# Patient Record
Sex: Male | Born: 1935
Health system: Southern US, Community
[De-identification: ages and names within clinical notes are randomized; demographics above are authoritative.]

## PROBLEM LIST (undated history)

## (undated) DIAGNOSIS — R0600 Dyspnea, unspecified: Secondary | ICD-10-CM

## (undated) DIAGNOSIS — E039 Hypothyroidism, unspecified: Secondary | ICD-10-CM

## (undated) DIAGNOSIS — I340 Nonrheumatic mitral (valve) insufficiency: Secondary | ICD-10-CM

## (undated) DIAGNOSIS — I4891 Unspecified atrial fibrillation: Secondary | ICD-10-CM

## (undated) DIAGNOSIS — R011 Cardiac murmur, unspecified: Secondary | ICD-10-CM

## (undated) DIAGNOSIS — R7303 Prediabetes: Secondary | ICD-10-CM

## (undated) DIAGNOSIS — N183 Chronic kidney disease, stage 3 unspecified: Secondary | ICD-10-CM

## (undated) DIAGNOSIS — I5032 Chronic diastolic (congestive) heart failure: Secondary | ICD-10-CM

## (undated) DIAGNOSIS — C4490 Unspecified malignant neoplasm of skin, unspecified: Secondary | ICD-10-CM

## (undated) DIAGNOSIS — K219 Gastro-esophageal reflux disease without esophagitis: Secondary | ICD-10-CM

## (undated) DIAGNOSIS — I219 Acute myocardial infarction, unspecified: Secondary | ICD-10-CM

## (undated) DIAGNOSIS — I2510 Atherosclerotic heart disease of native coronary artery without angina pectoris: Secondary | ICD-10-CM

## (undated) DIAGNOSIS — Z87442 Personal history of urinary calculi: Secondary | ICD-10-CM

## (undated) DIAGNOSIS — N201 Calculus of ureter: Secondary | ICD-10-CM

## (undated) DIAGNOSIS — M1990 Unspecified osteoarthritis, unspecified site: Secondary | ICD-10-CM

## (undated) DIAGNOSIS — I10 Essential (primary) hypertension: Secondary | ICD-10-CM

## (undated) HISTORY — PX: CARDIAC CATHETERIZATION: SHX172

## (undated) HISTORY — PX: CATARACT EXTRACTION W/ INTRAOCULAR LENS  IMPLANT, BILATERAL: SHX1307

## (undated) HISTORY — PX: KNEE ARTHROSCOPY: SUR90

## (undated) HISTORY — PX: ELECTROPHYSIOLOGIC STUDY: SHX172A

## (undated) HISTORY — PX: APPENDECTOMY: SHX54

## (undated) HISTORY — PX: CORONARY ANGIOPLASTY WITH STENT PLACEMENT: SHX49

## (undated) HISTORY — DX: Atherosclerotic heart disease of native coronary artery without angina pectoris: I25.10

## (undated) HISTORY — PX: TONSILLECTOMY: SUR1361

## (undated) HISTORY — DX: Dyspnea, unspecified: R06.00

## (undated) HISTORY — PX: CARDIOVASCULAR STRESS TEST: SHX262

---

## 2000-01-21 ENCOUNTER — Inpatient Hospital Stay (HOSPITAL_COMMUNITY): Admission: EM | Admit: 2000-01-21 | Discharge: 2000-01-24 | Payer: Self-pay | Admitting: Cardiology

## 2000-03-25 ENCOUNTER — Inpatient Hospital Stay (HOSPITAL_COMMUNITY): Admission: AD | Admit: 2000-03-25 | Discharge: 2000-03-31 | Payer: Self-pay | Admitting: Cardiology

## 2000-05-04 ENCOUNTER — Encounter: Payer: Self-pay | Admitting: Internal Medicine

## 2000-05-04 ENCOUNTER — Ambulatory Visit (HOSPITAL_COMMUNITY): Admission: RE | Admit: 2000-05-04 | Discharge: 2000-05-04 | Payer: Self-pay | Admitting: Internal Medicine

## 2001-05-17 ENCOUNTER — Encounter (INDEPENDENT_AMBULATORY_CARE_PROVIDER_SITE_OTHER): Payer: Self-pay | Admitting: Specialist

## 2001-05-17 ENCOUNTER — Other Ambulatory Visit: Admission: RE | Admit: 2001-05-17 | Discharge: 2001-05-17 | Payer: Self-pay | Admitting: Urology

## 2002-01-10 ENCOUNTER — Ambulatory Visit (HOSPITAL_COMMUNITY): Admission: RE | Admit: 2002-01-10 | Discharge: 2002-01-11 | Payer: Self-pay | Admitting: *Deleted

## 2002-01-10 ENCOUNTER — Encounter: Payer: Self-pay | Admitting: *Deleted

## 2002-10-23 ENCOUNTER — Ambulatory Visit (HOSPITAL_COMMUNITY): Admission: RE | Admit: 2002-10-23 | Discharge: 2002-10-24 | Payer: Self-pay | Admitting: Internal Medicine

## 2003-06-30 ENCOUNTER — Emergency Department (HOSPITAL_COMMUNITY): Admission: EM | Admit: 2003-06-30 | Discharge: 2003-06-30 | Payer: Self-pay | Admitting: Emergency Medicine

## 2004-07-03 ENCOUNTER — Ambulatory Visit: Payer: Self-pay | Admitting: Cardiology

## 2004-10-01 ENCOUNTER — Ambulatory Visit: Payer: Self-pay | Admitting: Internal Medicine

## 2004-12-23 ENCOUNTER — Ambulatory Visit: Payer: Self-pay | Admitting: Cardiology

## 2005-01-01 ENCOUNTER — Ambulatory Visit: Payer: Self-pay | Admitting: *Deleted

## 2005-03-29 ENCOUNTER — Ambulatory Visit: Payer: Self-pay | Admitting: Cardiology

## 2005-04-02 ENCOUNTER — Ambulatory Visit: Payer: Self-pay | Admitting: Cardiology

## 2005-06-07 ENCOUNTER — Ambulatory Visit: Payer: Self-pay | Admitting: Cardiology

## 2005-06-28 ENCOUNTER — Ambulatory Visit: Payer: Self-pay

## 2005-06-28 ENCOUNTER — Ambulatory Visit: Payer: Self-pay | Admitting: Cardiology

## 2005-07-08 ENCOUNTER — Ambulatory Visit: Payer: Self-pay | Admitting: Cardiology

## 2005-12-30 ENCOUNTER — Ambulatory Visit: Payer: Self-pay

## 2006-01-06 ENCOUNTER — Ambulatory Visit: Payer: Self-pay | Admitting: Internal Medicine

## 2006-02-14 ENCOUNTER — Ambulatory Visit: Payer: Self-pay | Admitting: Cardiology

## 2006-02-17 ENCOUNTER — Ambulatory Visit: Payer: Self-pay | Admitting: Internal Medicine

## 2006-05-09 ENCOUNTER — Ambulatory Visit: Payer: Self-pay | Admitting: Cardiology

## 2006-05-12 ENCOUNTER — Ambulatory Visit: Payer: Self-pay | Admitting: Cardiology

## 2006-09-05 ENCOUNTER — Ambulatory Visit: Payer: Self-pay | Admitting: Cardiology

## 2006-09-05 LAB — CONVERTED CEMR LAB
ALT: 23 units/L (ref 0–40)
AST: 25 units/L (ref 0–37)
Albumin: 3.4 g/dL — ABNORMAL LOW (ref 3.5–5.2)
Alkaline Phosphatase: 54 units/L (ref 39–117)
Total Bilirubin: 0.6 mg/dL (ref 0.3–1.2)

## 2006-09-08 ENCOUNTER — Ambulatory Visit: Payer: Self-pay | Admitting: Cardiology

## 2006-12-09 ENCOUNTER — Ambulatory Visit: Payer: Self-pay | Admitting: Cardiology

## 2006-12-18 ENCOUNTER — Observation Stay (HOSPITAL_COMMUNITY): Admission: EM | Admit: 2006-12-18 | Discharge: 2006-12-19 | Payer: Self-pay | Admitting: Emergency Medicine

## 2006-12-18 ENCOUNTER — Ambulatory Visit: Payer: Self-pay | Admitting: Cardiology

## 2007-03-06 ENCOUNTER — Ambulatory Visit: Payer: Self-pay | Admitting: Cardiology

## 2007-03-06 LAB — CONVERTED CEMR LAB
ALT: 26 units/L (ref 0–53)
Alkaline Phosphatase: 52 units/L (ref 39–117)
LDL Cholesterol: 59 mg/dL (ref 0–99)
Total CHOL/HDL Ratio: 3.1
VLDL: 16 mg/dL (ref 0–40)

## 2007-03-09 ENCOUNTER — Ambulatory Visit: Payer: Self-pay | Admitting: Internal Medicine

## 2007-09-04 ENCOUNTER — Ambulatory Visit: Payer: Self-pay

## 2007-09-04 LAB — CONVERTED CEMR LAB
Albumin: 3.4 g/dL — ABNORMAL LOW (ref 3.5–5.2)
Bilirubin, Direct: 0.2 mg/dL (ref 0.0–0.3)
HDL: 44.4 mg/dL (ref >39.0)
Total Bilirubin: 0.8 mg/dL (ref 0.3–1.2)
Total CHOL/HDL Ratio: 2.7
Triglycerides: 76 mg/dL (ref 0–149)
VLDL: 15 mg/dL (ref 0–40)

## 2007-09-11 ENCOUNTER — Ambulatory Visit: Payer: Self-pay | Admitting: Internal Medicine

## 2008-02-26 ENCOUNTER — Ambulatory Visit: Payer: Self-pay | Admitting: Cardiovascular Disease

## 2008-02-26 LAB — CONVERTED CEMR LAB
ALT: 24 units/L (ref 0–53)
Albumin: 3.8 g/dL (ref 3.5–5.2)
Alkaline Phosphatase: 54 units/L (ref 39–117)
Bilirubin, Direct: 0.2 mg/dL (ref 0.0–0.3)
Cholesterol: 117 mg/dL (ref 0–200)
LDL Cholesterol: 65 mg/dL (ref 0–99)
Total Protein: 6.6 g/dL (ref 6.0–8.3)
Triglycerides: 63 mg/dL (ref 0–149)
VLDL: 13 mg/dL (ref 0–40)

## 2008-03-07 ENCOUNTER — Ambulatory Visit: Payer: Self-pay | Admitting: Cardiology

## 2008-07-05 ENCOUNTER — Ambulatory Visit: Payer: Self-pay | Admitting: Cardiology

## 2008-09-02 ENCOUNTER — Ambulatory Visit: Payer: Self-pay | Admitting: Cardiology

## 2008-09-02 LAB — CONVERTED CEMR LAB
ALT: 23 units/L (ref 0–53)
AST: 25 units/L (ref 0–37)
Alkaline Phosphatase: 48 units/L (ref 39–117)
HDL: 41.5 mg/dL (ref >39.0)
Total Bilirubin: 0.8 mg/dL (ref 0.3–1.2)
Total CHOL/HDL Ratio: 2.8

## 2008-09-05 ENCOUNTER — Ambulatory Visit: Payer: Self-pay | Admitting: Internal Medicine

## 2009-02-17 ENCOUNTER — Ambulatory Visit: Payer: Self-pay | Admitting: Cardiovascular Disease

## 2009-02-17 LAB — CONVERTED CEMR LAB
Alkaline Phosphatase: 56 units/L (ref 39–117)
Bilirubin, Direct: 0.1 mg/dL (ref 0.0–0.3)
Cholesterol: 125 mg/dL (ref 0–200)
LDL Cholesterol: 67 mg/dL (ref 0–99)
Total Bilirubin: 0.8 mg/dL (ref 0.3–1.2)
Total CHOL/HDL Ratio: 3
Total Protein: 6 g/dL (ref 6.0–8.3)
VLDL: 17 mg/dL (ref 0.0–40.0)

## 2009-03-06 ENCOUNTER — Ambulatory Visit: Payer: Self-pay | Admitting: Cardiology

## 2009-03-06 DIAGNOSIS — E7800 Pure hypercholesterolemia, unspecified: Secondary | ICD-10-CM | POA: Insufficient documentation

## 2009-08-21 ENCOUNTER — Ambulatory Visit: Payer: Self-pay | Admitting: Cardiovascular Disease

## 2009-08-25 ENCOUNTER — Encounter (INDEPENDENT_AMBULATORY_CARE_PROVIDER_SITE_OTHER): Payer: Self-pay | Admitting: *Deleted

## 2009-08-26 LAB — CONVERTED CEMR LAB
ALT: 21 units/L (ref 0–53)
AST: 25 units/L (ref 0–37)
Albumin: 3.6 g/dL (ref 3.5–5.2)
Alkaline Phosphatase: 52 units/L (ref 39–117)
Bilirubin, Direct: 0 mg/dL (ref 0.0–0.3)
HDL: 38.4 mg/dL — ABNORMAL LOW (ref >39.00)
Total CHOL/HDL Ratio: 4
Total Protein: 6 g/dL (ref 6.0–8.3)

## 2010-01-05 DIAGNOSIS — K219 Gastro-esophageal reflux disease without esophagitis: Secondary | ICD-10-CM | POA: Insufficient documentation

## 2010-01-05 DIAGNOSIS — M129 Arthropathy, unspecified: Secondary | ICD-10-CM | POA: Insufficient documentation

## 2010-01-05 DIAGNOSIS — I2510 Atherosclerotic heart disease of native coronary artery without angina pectoris: Secondary | ICD-10-CM | POA: Insufficient documentation

## 2010-01-06 ENCOUNTER — Ambulatory Visit: Payer: Self-pay | Admitting: Cardiology

## 2010-02-16 ENCOUNTER — Ambulatory Visit: Payer: Self-pay | Admitting: Cardiology

## 2010-02-19 ENCOUNTER — Ambulatory Visit: Payer: Self-pay | Admitting: Cardiology

## 2010-06-02 LAB — CONVERTED CEMR LAB
ALT: 19 units/L (ref 0–53)
Albumin: 3.5 g/dL (ref 3.5–5.2)
Bilirubin, Direct: 0.2 mg/dL (ref 0.0–0.3)
Cholesterol: 138 mg/dL (ref 0–200)
LDL Cholesterol: 77 mg/dL (ref 0–99)
Total Protein: 5.9 g/dL — ABNORMAL LOW (ref 6.0–8.3)
Triglycerides: 83 mg/dL (ref 0.0–149.0)
VLDL: 16.6 mg/dL (ref 0.0–40.0)

## 2010-08-20 ENCOUNTER — Ambulatory Visit: Admission: RE | Admit: 2010-08-20 | Discharge: 2010-08-20 | Payer: Self-pay | Source: Home / Self Care

## 2010-08-20 ENCOUNTER — Ambulatory Visit: Admit: 2010-08-20 | Payer: Self-pay

## 2010-08-20 ENCOUNTER — Other Ambulatory Visit: Payer: Self-pay | Admitting: Internal Medicine

## 2010-08-20 LAB — LIPID PANEL
Cholesterol: 137 mg/dL (ref 0–200)
HDL: 43.4 mg/dL (ref >39.00)
LDL Cholesterol: 73 mg/dL (ref 0–99)
Total CHOL/HDL Ratio: 3
Triglycerides: 105 mg/dL (ref 0.0–149.0)
VLDL: 21 mg/dL (ref 0.0–40.0)

## 2010-08-20 LAB — HEPATIC FUNCTION PANEL
ALT: 25 U/L (ref 0–53)
AST: 26 U/L (ref 0–37)
Albumin: 3.8 g/dL (ref 3.5–5.2)
Alkaline Phosphatase: 52 U/L (ref 39–117)
Bilirubin, Direct: 0.2 mg/dL (ref 0.0–0.3)
Total Bilirubin: 1 mg/dL (ref 0.3–1.2)
Total Protein: 6.4 g/dL (ref 6.0–8.3)

## 2010-08-24 ENCOUNTER — Ambulatory Visit: Admission: RE | Admit: 2010-08-24 | Discharge: 2010-08-24 | Payer: Self-pay | Source: Home / Self Care

## 2010-09-01 NOTE — Assessment & Plan Note (Signed)
Summary: rov lipid - lmc   CC:  dyslipidemia follow-up.  History of Present Illness:  Lipid Clinic Visit      The patient comes in today for dyslipidemia follow-up.  The patient has no history of medication problems, chest pain, palpitations, shortness of breath, muscle aches, and muscle cramps.  Dietary compliance review reveals an overall grade of not eating 5 or more fruits and vegetables, counting carbohydrates, and limiting fats and TFA's.  Review of exercise habits reveals that the patient is biking and 3-4 times a week.  Compliance with medication is good.    Med and compliant and somewhat heart healthy diet.  Daily work on farm.  Rides exercise bike 30-4min 3-4 days a week  Daughter is a Teacher, music from [place]   Current Medications (verified): 1)  Lipitor 80 Mg Tabs (Atorvastatin Calcium) .... Take One Tablet By Mouth Daily. 2)  Metoprolol Tartrate 25 Mg Tabs (Metoprolol Tartrate) .... Take One Tablet By Mouth Twice A Day 3)  Niaspan 1000 Mg Cr-Tabs (Niacin (Antihyperlipidemic)) 4)  Aspirin 81 Mg Tbec (Aspirin) .... Take One Tablet By Mouth Daily 5)  Nexium 40 Mg Cpdr (Esomeprazole Magnesium) 6)  Folic Acid 400 Mcg Tabs (Folic Acid) 7)  Meloxicam 15 Mg Tabs (Meloxicam) 8)  Centrum Silver  Tabs (Multiple Vitamins-Minerals) 9)  Tramadol Hcl 50 Mg Tabs (Tramadol Hcl)   Vital Signs:  Patient profile:   75 year old male Height:      67 inches Weight:      188.8 pounds Pulse rate:   70 / minute Pulse rhythm:   regular BP sitting:   160 / 88  (left arm) Cuff size:   regular  Impression & Recommendations:  Problem # 1:  HYPERCHOLESTEROLEMIA-PURE (ICD-272.0)  His updated medication list for this problem includes:    Lipitor 80 Mg Tabs (Atorvastatin calcium) .Marland Kitchen... Take one tablet by mouth daily.    Niaspan 1000 Mg Cr-tabs (Niacin (antihyperlipidemic))  Orders: TLB-Hepatic/Liver Function Pnl (80076-HEPATIC) TLB-Lipid Panel (80061-LIPID)  [name] is a pleasant man  who returns to lipid clinic today with no complaints.  He is compliant with meds and moslty a heart helathy diet.   He does eat ice cream or popcorn  as an evening snack a few days a week.  We have talked about portion sizes, decreasing CHO and increasing fruit and vegie and fiber in diet.  He is doing a great job with his exercise.  Last lipid panel was at goal  - repeats drawn this AM and I will call him with reults - done 08/22/09  TC 138 at goal < 20   TG 164 > goal < 150 (85 at last visit)  LDL 67 at goal < 70   HDL 38 < goal > 40 - (41 at last visit) F/U in 6 months Prescriptions: NIASPAN 1000 MG CR-TABS (NIACIN (ANTIHYPERLIPIDEMIC))   #90 x 3   Entered by:   Leota Sauers, PharmD, BCPS, CPP   Authorized by:   Gaylord Shih, MD, [facility]   Signed by:   Leota Sauers, PharmD, BCPS, CPP on 08/21/2009   Method used:   Faxed to ...       [facility] ([facility]-order)       [address] [place], [place]  [address]       Ph: [phone]       Fax: [phone]   RxID:   [id number]

## 2010-09-01 NOTE — Letter (Signed)
Summary: Custom - Lipid  [facility], Main Office  [address]   [place], [place] [address]   Phone: [phone]  Fax: [phone]     August 25, 2009 MRN: [id number]   [name] [address] [place], [place]  [address]   Dear Mr. [name],  We have reviewed your cholesterol results.  They are as follows:     Total Cholesterol:    138 (Desirable: less than 200)       HDL  Cholesterol:     38.40  (Desirable: greater than 40 for men and 50 for women)       LDL Cholesterol:       67  (Desirable: less than 100 for low risk and less than 70 for moderate to high risk)       Triglycerides:       164.0  (Desirable: less than 150)  Our recommendations include:These numbers look good. Continue on the same medicine. Liver function is normal. Take care, Dr. Leanora Cover.    Call our office at the number listed above if you have any questions.  Lowering your LDL cholesterol is important, but it is only one of a large number of "risk factors" that may indicate that you are at risk for heart disease, stroke or other complications of hardening of the arteries.  Other risk factors include:   A.  Cigarette Smoking* B.  High Blood Pressure* C.  Obesity* D.   Low HDL Cholesterol (see yours above)* E.   Diabetes Mellitus (higher risk if your is uncontrolled) F.  Family history of premature heart disease G.  Previous history of stroke or cardiovascular disease    *These are risk factors YOU HAVE CONTROL OVER.  For more information, visit .  There is now evidence that lowering the TOTAL CHOLESTEROL AND LDL CHOLESTEROL can reduce the risk of heart disease.  The American Heart Association recommends the following guidelines for the treatment of elevated cholesterol:  1.  If there is now current heart disease and less than two risk factors, TOTAL CHOLESTEROL should be less than 200 and LDL CHOLESTEROL should be less than 100. 2.  If there is current heart disease or two or  more risk factors, TOTAL CHOLESTEROL should be less than 200 and LDL CHOLESTEROL should be less than 70.  A diet low in cholesterol, saturated fat, and calories is the cornerstone of treatment for elevated cholesterol.  Cessation of smoking and exercise are also important in the management of elevated cholesterol and preventing vascular disease.  Studies have shown that 30 to 60 minutes of physical activity most days can help lower blood pressure, lower cholesterol, and keep your weight at a healthy level.  Drug therapy is used when cholesterol levels do not respond to therapeutic lifestyle changes (smoking cessation, diet, and exercise) and remains unacceptably high.  If medication is started, it is important to have you levels checked periodically to evaluate the need for further treatment options.  Thank you,    [facility] [employer] Team

## 2010-09-01 NOTE — Assessment & Plan Note (Signed)
Summary: 18 month rov/sl      Allergies Added: NKDA]  Visit Type:  Follow-up Primary Provider:  Dr. Azucena Cecil  CC:  CAD.  History of Present Illness: The patient presents for followup of his known coronary disease.since I last saw him he has done well from a cardiovascular standpoint. He has had no chest pressure, neck or arm discomfort. He has had no palpitations, presyncope or syncope. His breathing is normal without any PND or orthopnea. 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 With this he has none of the above symptoms or previous symptoms.  Current Medications (verified): 1)  Lipitor 80 Mg Tabs (Atorvastatin Calcium) .... Take One Tablet By Mouth Daily. 2)  Metoprolol Tartrate 25 Mg Tabs (Metoprolol Tartrate) .... Take One Tablet By Mouth Twice A Day 3)  Niaspan 1000 Mg Cr-Tabs (Niacin (Antihyperlipidemic)) 4)  Aspirin 81 Mg Tbec (Aspirin) .... Take One Tablet By Mouth Daily 5)  Nexium 40 Mg Cpdr (Esomeprazole Magnesium) 6)  Folic Acid 400 Mcg Tabs (Folic Acid) 7)  Meloxicam 15 Mg Tabs (Meloxicam) 8)  Centrum Silver  Tabs (Multiple Vitamins-Minerals) 9)  Tramadol Hcl 50 Mg Tabs (Tramadol Hcl)  Allergies (verified): No Known Drug Allergies  Past History:  Past Medical History: Reviewed history from 01/05/2010 and no changes required.  Coronary artery disease (previous myocardial   infarction in 2001 with 90% circumflex lesion treated with PTCA.  The   LAD had 70-80% stenosis which was treated with angioplasty.  Most recent   catheterization in 2003 demonstrated the LAD at 30% mid stenosis,   circumflex 60-70% stenosis in the mid AV grove, and 40% stenosis in the   previously stented area.  The right coronary artery had 30-40% stenosis   in the mid portion.  The EF was 55%.  He had PTCA and stenting in the AV   circumflex.  His last stress perfusion study was in 2006), previous   syncope with a negative electrophysiology workup, gastroesophageal   reflux  disease, dyslipidemia (followed in the Lipid Clinic), arthritis.      Past Surgical History: Reviewed history from 01/05/2010 and no changes required.  1. Appendectomy.   2. Arthroscopic knee surgery.   Review of Systems       As stated in the HPI and negative for all other systems.   Vital Signs:  Patient profile:   75 year old male Height:      67 inches Weight:      183 pounds BMI:     28.77 Pulse rate:   63 / minute Resp:     16 per minute BP sitting:   120 / 76  (right arm)  Vitals Entered By: Marrion Coy, CNA (January 06, 2010 9:46 AM)  Physical Exam  General:  Well developed, well nourished, in no acute distress. Head:  normocephalic and atraumatic Eyes:  PERRLA/EOM intact; conjunctiva and lids normal. Mouth:  Teeth, gums and palate normal. Oral mucosa normal. Neck:  Neck supple, no JVD. No masses, thyromegaly or abnormal cervical nodes. Chest Wall:  no deformities or breast masses noted Lungs:  Clear bilaterally to auscultation and percussion. Abdomen:  Bowel sounds positive; abdomen soft and non-tender without masses, organomegaly, or hernias noted. No hepatosplenomegaly. Msk:  Back normal, normal gait. Muscle strength and tone normal. Pulses:  pulses normal in all 4 extremities Extremities:  No clubbing or cyanosis. Neurologic:  Alert and oriented x 3. Skin:  Intact without lesions or rashes. Psych:  Normal  affect.   Detailed Cardiovascular Exam  Neck    Carotids: Carotids full and equal bilaterally without bruits.      Neck Veins: Normal, no JVD.    Heart    Inspection: no deformities or lifts noted.      Palpation: normal PMI with no thrills palpable.      Auscultation: regular rate and rhythm, S1, S2 without murmurs, rubs, gallops, or clicks.    Vascular    Abdominal Aorta: no palpable masses, pulsations, or audible bruits.      Femoral Pulses: normal femoral pulses bilaterally.      Pedal Pulses: normal pedal pulses bilaterally.      Radial  Pulses: normal radial pulses bilaterally.      Peripheral Circulation: no clubbing, cyanosis, or edema noted with normal capillary refill.     EKG  Procedure date:  01/06/2010  Findings:      Sinus rhythm, rate 63, axis within normal limits, intervals within normal limits, no acute ST-T wave changes  Impression & Recommendations:  Problem # 1:  CAD (ICD-414.00) The patient has no new symptoms. He had a stress perfusion study in 2008. He has a quite vigorous lifestyle. At this point no further cardiovascular testing. He will continue with risk reduction. Orders: EKG w/ Interpretation (93000)  Problem # 2:  HYPERCHOLESTEROLEMIA-PURE (ICD-272.0) I reviewed his lipid profile fromJanuary of this year. I would like his HDL which is 38.4 to be higher with increased exercise. His LDL is 67. He will continue the meds as listed.  Patient Instructions: 1)  Your physician recommends that you schedule a follow-up appointment in: 18 months with Dr Antoine Poche 2)  Your physician recommends that you continue on your current medications as directed. Please refer to the Current Medication list given to you today.

## 2010-09-01 NOTE — Assessment & Plan Note (Signed)
Summary: rov lipid -lmc   CC:  dyslipidemia follow-up.   Lipid Clinic Visit      The patient presents today for dyslipidemia follow-up.  The patient is currently on Lipitor 80mg  daily and Niacin 1000mg  daily and denies any medication problems from these.  He occassionally has hot flushes from his niacin, but he does take an aspirin before his niacin.  Dietary compliance review reveals that patient has been fairly compliant with dietary recommendations given at visit.  For breakfast, he usually has cereal.  He normally has a light lunch, consisting of a sandwich.  For supper, he will have a meat with vegetables, a tomato sandwich, or fruit.  He states that he eats 2 servings of fruit and vegetables per day.  He drinks sweet tea.  He has decreased his nighttime snacking, but will still have ice cream at night 2-3 times per week.  Review of exercise habits reveals that the patient is walking 4-5 times a week on treadmill for 30 minutes.  He also works on a farm daily. Compliance with medication is good.    Lipid Management Provider  Weston Brass, PharmD and Dillard Cannon, PharmD Candidate  Current Medications (verified): 1)  Lipitor 80 Mg Tabs (Atorvastatin Calcium) .... Take One Tablet By Mouth Daily. 2)  Metoprolol Tartrate 25 Mg Tabs (Metoprolol Tartrate) .... Take One Tablet By Mouth Twice A Day 3)  Niaspan 1000 Mg Cr-Tabs (Niacin (Antihyperlipidemic)) 4)  Aspirin 81 Mg Tbec (Aspirin) .... Take One Tablet By Mouth Daily 5)  Nexium 40 Mg Cpdr (Esomeprazole Magnesium) 6)  Folic Acid 400 Mcg Tabs (Folic Acid) 7)  Meloxicam 15 Mg Tabs (Meloxicam) 8)  Centrum Silver  Tabs (Multiple Vitamins-Minerals) 9)  Tramadol Hcl 50 Mg Tabs (Tramadol Hcl) 10)  Nitrostat 0.4 Mg Subl (Nitroglycerin) .Marland Kitchen.. 1 By Mouth As Needed For Chest Pain  Allergies (verified): No Known Drug Allergies  Past History:  Past Medical History: Last updated: 01-16-10  Coronary artery disease (previous myocardial   infarction  in 2001 with 90% circumflex lesion treated with PTCA.  The   LAD had 70-80% stenosis which was treated with angioplasty.  Most recent   catheterization in 2003 demonstrated the LAD at 30% mid stenosis,   circumflex 60-70% stenosis in the mid AV grove, and 40% stenosis in the   previously stented area.  The right coronary artery had 30-40% stenosis   in the mid portion.  The EF was 55%.  He had PTCA and stenting in the AV   circumflex.  His last stress perfusion study was in 2006), previous   syncope with a negative electrophysiology workup, gastroesophageal   reflux disease, dyslipidemia (followed in the Lipid Clinic), arthritis.      Family History: Last updated: 01-16-10  Mother died at 78 of complications of CHF.  Father died   at 35 with multiple medical problems including heart valve replacement   and prostate cancer.       Vital Signs:  Patient profile:   75 year old male Height:      67 inches Weight:      0.50 pounds BP sitting:   132 / 78  (right arm)  Impression & Recommendations:  Problem # 1:  HYPERCHOLESTEROLEMIA-PURE (ICD-272.0) Patient returns to lipid clinic today.  His lipid values include: TC 138 (at goal<200) TG 83 (at goal<150) HDL 44 (at goal >40) LDL 77 (goal <70) AST and ALT are WNL Patient is motivated to continue heart healthy, low-fat, low-cholesterol diet.  We discussed continuing to decrease his carbohydrate intake and increasing fruits and vegetables to 3-5 servings per day instead of the 2 servings he is currently eating.  He has decreased his evening snacking since his last visit and was encouraged to continue to decrease this.  Patient currently engages in daily activity, whether it is walking on treadmill or working on the farm.  He is motivated to continue his exercise.  We will follow-up with patient in 6 months and then decide whether he should continue with the lipid clinic or return to his PCP for follow-up.  His updated medication list  for this problem includes:    Lipitor 80 Mg Tabs (Atorvastatin calcium) .Marland Kitchen... Take one tablet by mouth daily.    Niaspan 1000 Mg Cr-tabs (Niacin (antihyperlipidemic))  Patient Instructions: 1)  Continue same medication regimen of Lipitor 80mg  daily and Niacin 1000mg . 2)  Continue low-fat, low-cholesterol, heart healthy diet. 3)  Continue exercise on treadmill and working on farm. 4)  Lab Appt: 08/17/2010 at 8:30 am 5)  Lipid Appt: 08/20/2010 at 8:30 am

## 2010-09-03 NOTE — Assessment & Plan Note (Signed)
Summary: rov lipd- lmc    Mr.  Dues presents today for hyperlipidemia follow up.  He is currently taking Lipitor 80 mg nightly (changed to generic for the past few weeks) and Niaspan 1000 mg.  Patient reports occassional hot flashes with Niaspan although has been taking ASA 81 mg 30 minutes prior to every dose of Niaspan.  Mr. Reister reports no other notable side effects and is very compliant with current regimen.  Per patient, there are no major changes in his diet.  Due to recent spinal pain, Mr. Sakuma excercising days went from 5 day/week to  ~3-4 days/week for about 30 minutes each session biking instead of being on the treadmill.  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     Lipid Management Provider  Geoffry Paradise, PharmD  Current Medications (verified): 1)  Lipitor 80 Mg Tabs (Atorvastatin Calcium) .... Take One Tablet By Mouth Daily. 2)  Metoprolol Tartrate 25 Mg Tabs (Metoprolol Tartrate) .... Take One Tablet By Mouth Twice A Day 3)  Niaspan 1000 Mg Cr-Tabs (Niacin (Antihyperlipidemic)) 4)  Aspirin 81 Mg Tbec (Aspirin) .... Take One Tablet By Mouth Daily 5)  Nexium 40 Mg Cpdr (Esomeprazole Magnesium) 6)  Folic Acid 400 Mcg Tabs (Folic Acid) 7)  Meloxicam 15 Mg Tabs (Meloxicam) 8)  Centrum Silver  Tabs (Multiple Vitamins-Minerals) 9)  Tramadol Hcl 50 Mg Tabs (Tramadol Hcl) 10)  Nitrostat 0.4 Mg Subl (Nitroglycerin) .Marland Kitchen.. 1 By Mouth As Needed For Chest Pain  Allergies: No Known Drug Allergies   Vital Signs:  Patient profile:   75 year old male Height:      67 inches Weight:      186 pounds BMI:     29.24 BP sitting:   142 / 70  (left arm)  Impression & Recommendations:  Problem # 1:  HYPERCHOLESTEROLEMIA-PURE (ICD-272.0) Mr. Warda's hyperlipidemia is currently under controlled with Lipitor 80mg  nightly and Niaspan 1000 mg without intolerable side effects.  Lipid panel came back with all components within goal.  Patient was educated on the importance of  a balance diet and excerising schedule.  Plan is to continue current regimen, return to lipid clinic in 6 months for eval prior to sending patient back to PCP for management.  Mr. Guardado has 59 day-supply of Lipitor left and will need refills for Lipitor and Niaspan to be called into [facility] prior to his next Lipid Clinic visit in 6 months.     His updated medication list for this problem includes:    Lipitor 80 Mg Tabs (Atorvastatin calcium) .Marland Kitchen... Take one tablet by mouth daily.    Niaspan 1000 Mg Cr-tabs (Niacin (antihyperlipidemic))

## 2010-10-21 ENCOUNTER — Other Ambulatory Visit: Payer: Self-pay | Admitting: Cardiology

## 2010-10-21 MED ORDER — METOPROLOL TARTRATE 25 MG PO TABS: 25.0000 mg | ORAL_TABLET | Freq: Two times a day (BID) | ORAL | Status: DC

## 2010-10-21 MED ORDER — NIACIN ER (ANTIHYPERLIPIDEMIC) 1000 MG PO TBCR: 1000.0000 mg | EXTENDED_RELEASE_TABLET | Freq: Every day | ORAL | Status: DC

## 2010-10-21 MED ORDER — ATORVASTATIN CALCIUM 80 MG PO TABS: 80.0000 mg | ORAL_TABLET | Freq: Every day | ORAL | Status: DC

## 2010-12-15 NOTE — Assessment & Plan Note (Signed)
[facility]                            CARDIOLOGY OFFICE NOTE   NAME:Malenfant, CLYDE ZARRELLA                       MRN:          [id number]  DATE:07/05/2008                            DOB:          04/01/1936    PRIMARY CARE PHYSICIAN:  Tally Joe, MD   REASON FOR PRESENTATION:  Evaluate the patient with coronary disease.   HISTORY OF PRESENT ILLNESS:  The patient presents for 4-month followup.  He is now 75 years old.  Since I last saw him, he has done quite well.  He exercises 5 days a week.  He rides a bicycle.  He tends a cattle  farm.  With this level of activity, he denies any chest discomfort, neck  or arm discomfort.  He has had no palpations, presyncope or syncope.  He  has had no PND, orthopnea.   PAST MEDICAL HISTORY:  Coronary artery disease (previous myocardial  infarction in 2001 with 90% circumflex lesion treated with PTCA.  The  LAD had 70-80% stenosis which was treated with angioplasty.  Most recent  catheterization in 2003 demonstrated the LAD at 30% mid stenosis,  circumflex 60-70% stenosis in the mid AV grove, and 40% stenosis in the  previously stented area.  The right coronary artery had 30-40% stenosis  in the mid portion.  The EF was 55%.  He had PTCA and stenting in the AV  circumflex.  His last stress perfusion study was in 2006), previous  syncope with a negative electrophysiology workup, gastroesophageal  reflux disease, dyslipidemia (followed in the Lipid Clinic), arthritis.   ALLERGIES:  None.   MEDICATIONS:  1. Folic acid 400 mg daily.  2. Niacin.  3. Multivitamin.  4. Lipitor 60 mg daily.  5. Aspirin 81 mg daily.  6. Nexium 40 mg daily.  7. Zetia 10 mg daily.  8. B12.  9. Lopressor 25 mg b.i.d. (recently increased secondary to some high      blood pressures).  10.Meloxicam 15 mg b.i.d.   REVIEW OF SYSTEMS:  As stated in the HPI and otherwise negative for  other systems.   PHYSICAL EXAMINATION:  GENERAL:  The  patient is in no distress.  VITAL SIGNS:  Blood pressure 119/61, heart rate 75 and irregular, weight  185 pounds, body mass index 25.  NECK:  No jugular venous distension at 45 degrees, carotid upstroke  brisk and symmetric.  No bruit.  No thyromegaly.  LYMPHATICS:  No adenopathy.  LUNGS:  Clear to auscultation bilaterally.  BACK:  No costovertebral angle tenderness.  CHEST:  Unremarkable.  HEART:  PMI not displaced or sustained.  S1 and S2 are within normal  limits.  No S3, no S4. No clicks, no rubs, no murmurs.  ABDOMEN:  Flat, positive bowel sounds, normal in frequency and pitch.  No bruits, no rebound, no guarding.  No midline pulsatile mass.  No  organomegaly.  SKIN:  No rashes, no nodules.  EXTREMITIES:  2+ pulses, no edema.   EKG, sinus rhythm, rate 68, axis within normal limits, intervals within  normal limits.  No acute ST-wave changes.  ASSESSMENT AND PLAN:  1. Coronary disease.  The patient is doing very well.  He has had no      symptoms.  He is participating aggressively in secondary risk      reduction.  At this point, no further cardiovascular testing is      suggested.  He will continue with the med as listed.  2. Hypertension.  Blood pressure is running a little bit high      recently.  However, this is followed closely by his primary doctor.      He will keep a watch on this and will continue the meds as listed.  3. Dyslipidemia.  His HDL is still a little bit below 40.  His LDL is      fine.  He will continue with the Lipid Clinic and the meds as      listed.  4. Followup.  I will see him back in 18 months or sooner if needed.     Rollene Rotunda, MD, [facility]  Electronically Signed    JH/MedQ  DD: 07/05/2008  DT: 07/06/2008  Job #: [id number]   cc:   Tally Joe, M.D.

## 2010-12-15 NOTE — H&P (Signed)
NAMEABDURRAHMAN, [name]                ACCOUNT NO.:  [id number]   MEDICAL RECORD NO.:  [id number]          PATIENT TYPE:  EMS   LOCATION:  MAJO                         FACILITY:  [facility]   PHYSICIAN:  Rollene Rotunda, MD, FACCDATE OF BIRTH:  07-21-1936   DATE OF ADMISSION:  12/18/2006  DATE OF DISCHARGE:                              HISTORY & PHYSICAL   CHIEF COMPLAINT:  Chest pain.   HISTORY OF PRESENT ILLNESS:  A 75 year old man followed by Dr. Antoine Poche  seen 2 weeks ago who was well until today when he awoke with leg pain  followed by nausea, vomiting and cold sweats x2.  He also noted 1-2/10  substernal chest pain which has persisted without radiation.  The pain  is similar in character with decreased intensity compared with prior  symptoms at the time of his 2001 MI as detailed below.  There have been  no relieving symptoms.  The pain has at least persisted for more than 10  hours now with regards to exertion.  He recalls mowing his lawn  yesterday with a brand new self-propelled mower.  He also has some  constipation symptoms and is concerned that the pain may represent  indigestion.   ALLERGIES:  None.   MEDICATIONS:  1. Folic acid 400 mg daily.  2. Toprol XL 25 mg daily.  3. Niacin 1000 mg daily.  4. Multivitamin daily.  5. Lipitor 60 mg daily.  6. Meloxicam 15 mg daily.  7. Aspirin 81 mg daily.  8. Nexium 40 mg daily.  9. Zetia 10 mg daily.   PAST MEDICAL HISTORY:  1. Coronary artery disease.  Previous MI in 2001 with a 90% left      circumflex, 70-80% LAD.  Both of these were stented.  His last cath      was 2003 and LAD had 30% stenosis, 60-70% mid-circ.  A 40% in-stent      stenosis was noted at the time with a 30-40% RCA lesion.  2. Syncope with negative EP workup.  3. GERD.  4. Hypercholesterolemia.  5. Osteoarthritis.   SURGICAL HISTORY:  1. Appendectomy.  2. Arthroscopic knee surgery.   SOCIAL HISTORY:  Lives in [place] where he was involved in the [employer].  No smoking, no alcohol, no drugs.   FAMILY HISTORY:  Mother died at 100 of complications of CHF.  Father died  at 21 with multiple medical problems including heart valve replacement  and prostate cancer.   REVIEW OF SYSTEMS:  Chest pain and leg pain which he relates to prior  diagnosed sciatic nerve condition.   PHYSICAL EXAMINATION:  VITAL SIGNS:  Temperature 98.7, pulse 74,  respiratory rate 18, blood pressure 120/80.  GENERAL:  He is in no apparent distress.  NECK:  Supple.  HEENT:  Pupils are equal, round, reactive to light.  Extraocular motion  is intact.  LYMPHATICS:  Lymphadenopathy none.  CARDIOVASCULAR:  Precordium is quiet.  PMI is in the fourth intercostal  space just medial to the midclavicular line.  It is focal with no  heaves.  S1 and S2 are regular  in cadence with no murmurs appreciated.  No rubs or gallops.  Pulses at the femoral and radial positions are  equal and approximately 2+.  LUNGS:  Clear to auscultation without wheezes, rales or rhonchi.  SKIN:  No lesions.  ABDOMEN:  Soft, nontender.  EXTREMITIES:  No clubbing, cyanosis or edema.  MUSCULOSKELETAL:  No deformities, effusions or CVA tenderness.  NEUROLOGIC:  Alert and oriented x3.  Cranial nerves II-XII grossly  intact.  Strength 5/5 in all extremities and axial groups.  Normal  sensation throughout.  Normal cerebellar function.   CHEST X-RAY:  No infiltrate, no exudates.   ELECTROCARDIOGRAM:  An EKG from 1715 on Dec 18, 2006 demonstrates a  normal sinus rhythm with a normal P-wave axis, QRS axis of 30, T-wave  inversions in lead V1.   ASSESSMENT/PLAN:  This is a very pleasant 75 year old male with a  history of coronary artery disease now with chest pain.  We will attempt  to get the patient chest pain free, at which time I will like to order  an exercise stress test.  In the meantime, will order beta-blockers,  aspirin, statin, Lovenox, aspirin.  Will add a GI cocktail in the   emergency room plus proton pump inhibition.      Daniel B. Haithcock, MD   Electronically Signed     ______________________________  Rollene Rotunda, MD, [facility]    DBH/MEDQ  D:  12/18/2006  T:  12/19/2006  Job:  [id number]

## 2010-12-15 NOTE — Discharge Summary (Signed)
[name], [name]                ACCOUNT NO.:  [id number]   MEDICAL RECORD NO.:  [id number]          PATIENT TYPE:  INP   LOCATION:  3735                         FACILITY:  [facility]   PHYSICIAN:  Rollene Rotunda, MD, FACCDATE OF BIRTH:  06/26/1936   DATE OF ADMISSION:  12/18/2006  DATE OF DISCHARGE:  12/19/2006                               DISCHARGE SUMMARY   PROCEDURES:  Stress Myoview.   PRIMARY DIAGNOSIS:  Chest pain.   SECONDARY DIAGNOSES:  1. Status post MI in June 2001 with cutting balloon angioplasty to the      proximal LAD and PTCA, and bare metal stent to the circumflex with      PTCA to the OM-2 as well.  2. Syncope in March 03, 2000 with negative EP study and implantation      of a loop recorder that would explanted in 2004.  3. Status post cardiac catheterization in June 2003 with non-      obstructive disease in the LAD and RCA, circumflex 60-70% treated      with a bare metal stent, reducing the stenosis to zero.  4. Gastroesophageal reflux disease.  5. Hyperlipidemia.  6. Osteoarthritis.  7. Status post appendectomy and arthroscopic knee surgery.   Time at discharge 38 minutes.   HOSPITAL COURSE:  [name] is a 75 year old male with known coronary  artery disease.  He awoke with leg pain and nausea and vomiting, as well  as cold sweats and 102 temp with substernal chest pain.  He came to the  emergency room and was admitted for further evaluation and treatment.   His cardiac enzymes showed some elevation in the CK-MB with a peak of  214/8.5, with an index of 4.0.  However, his troponins were all within  normal limits, with a peak of 0.03.  Because of his history of coronary  artery disease and the elevation in his CK-MBs, was an inpatient stress  test was performed.   During the stress test, he exercised on the Bruce protocol and reached  his target heart rate.  His EKG was unchanged, and he had no chest pain.  The stress images showed no ischemia and an EF  of 62%.   As part of his evaluation, a lipid profile was performed which showed  total cholesterol of 99, triglycerides 35, HDL 38, LDL 54.  He was  evaluated by Dr. Antoine Poche, and it was felt that since his symptoms  resolved and his stress test was without ischemia, he could be  discharged home with outpatient follow-up as scheduled.  Of note, Mr.  Brandt reports that on a daily basis he has problems gas pressure and a  stomach, and he is concerned that this may be related to his  medications.  He his meds were reviewed, and there is a possibility  that, either the locked the Meloxicam or the folic acid is causing these  issues.  He is going to try over-the-counter medications for his  symptoms and follow up with his primary care physician, if this is not  successful.  [name] was considered stable  for discharge on Dec 19, 2006.   DISCHARGE INSTRUCTIONS:  His activity level is to be as tolerated.  He  is to stick to a low-fat diet.  He is to follow up with Dr. Antoine Poche and  Dr. Opal Sidles at [facility] practice as scheduled.   DISCHARGE MEDICATIONS:  1. Folic acid 1 mg daily.  2. Toprol XL 25 mg daily.  3. Meloxicam 15 mg a day.  4. Aspirin 81 mg a day.  5. Nexium 40 mg a day.  6. Zetia 10 mg daily.  7. Niacin 1,000 mg daily.  8. Lipitor 40 mg 1-1/2 tablets daily.  9. Centrum vitamins daily.  10.Simethicone 80 mg up to 6 tablets daily.      Theodore Demark, PA-C      Rollene Rotunda, MD, [facility]  Electronically Signed    RB/MEDQ  D:  12/19/2006  T:  12/19/2006  Job:  [id number]   cc:   Tally Joe, M.D.

## 2010-12-15 NOTE — Assessment & Plan Note (Signed)
[facility]                               LIPID CLINIC NOTE   NAME:[name], [name]                       MRN:          [id number]  DATE:09/05/2008                            DOB:          [birth year]    Return office visit for Lipid Clinic.   PAST MEDICAL HISTORY:  1. Hyperlipidemia.  2. Coronary artery disease.  3. Gastroesophageal reflux disease.   MEDICATIONS:  1. Niacin 1 g daily.  2. Lipitor 60 mg daily.  3. Zetia 10 mg daily.  4. Folic acid 400 mg daily.  5. Multivitamin daily.  6. Meloxicam 50 mg once daily.  7. Metoprolol 25 mg twice daily.  8. Vitamin B12 daily.  9. Nexium 40 mg daily.  10.Aspirin 81 mg daily.   PHYSICAL EXAMINATION:  VITAL SIGNS:  Weight 186 pounds, blood pressure  140/80, heart rate 80.   LABORATORY DATA:  Total cholesterol 115, triglyceride 61, HDL 42, LDL  61.  LFTs within normal limits.   ASSESSMENT:  Mr. Hudspeth is a very pleasant 75 year old gentleman who  returns to Lipid Clinic today with no chest pain, no shortness of  breath, no muscle aches or pains.  He states compliance with current  medication regimen.  His daughter is a Teacher, early years/pre who monitors most of  his medications regularly and ensures that he is following a low-fat and  low-carbohydrate diet.  She also encourages him to exercise on a regular  basis, which he seems to do so most of the time.  He previously was  walking on a treadmill, which has been hurting his leg.  He has a  sciatica pain that after vigorous exercise hurts his legs and his feet.  He has most recently switched to an exercise bike where he is going 30  minutes 5 times a week where he says he does not feel like this is  getting as vigorous of an exercise as he had previously done on the  treadmill.  I have encouraged him to warm up for a few minutes then do  some interval training, pedal as fast as he can for a minute then slow  down for a few minutes and repeat this process  throughout his 30-minute  exercise program.  He seems to be a little leery of this because he says  that when he does vigorous exercise, it does hurt his leg a little bit  more so than if he does strides at an even pace.  I have encouraged him  to just go slow and to do what is tolerated and increase as possible.  He does seem to follow a heart-healthy diet, and as stated his daughter  does seem to be on top of this.  His total cholesterol is at goal of  less than 200, triglycerides are at goal of less than 150, HDL is at  goal of greater than 40, and LDL is at goal of less than 70.  He had  previously been on Lipitor 60 mg daily, which he is tolerating well and  has not tried higher doses.  He has been on Zetia and tolerating niacin  well.  We had a discussion today of increasing his Lipitor to 80 mg  daily and taking him off his Zetia to see if this will continue to  maintain his lipid panel at goal.  He is agreeable to doing this.  He  would be happy to decrease the number of medications and payment that he  has, and we will give this a try and follow up in 6 months for lipid  panel and LFTs again.   PLAN:  1. Discontinue Zetia and increase Lipitor to 80 mg daily.  Continue      Niaspan 2 g daily.  2. Continue low-fat, low-carbohydrate diet.  3. Consider interval training on his exercise regimen to help keep his      heart rate up.  4. Followup visit in 6 months for lipid panel and LFTs.  We will make      adjustments at that time.      Leota Sauers, PharmD  Electronically Signed      Jesse Sans. Daleen Squibb, MD, [facility]  Electronically Signed   LC/MedQ  DD: 09/05/2008  DT: 09/06/2008  Job #: [id number]

## 2010-12-15 NOTE — Assessment & Plan Note (Signed)
[facility]                               LIPID CLINIC NOTE   NAME:Brandt Brandt GALYEAN                       MRN:          [id number]  DATE:03/09/2007                            DOB:          Jan 21, 1936    Brandt Brandt is seen back in the lipid clinic for further evaluation and  medication titration, associated with his hyperlipidemia in the setting  of documented coronary disease.  He has been compliant with his  combination therapy of Lipitor 60 mg daily at bedtime, Zetia 10 mg daily  and Niaspan 1000 mg daily at bedtime.  The patient has been feeling and  doing well overall.  He has been compliant with these therapies.  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  He also walks for 30 minutes each day of the week, though his foot  pain and prickly sensations do continue.  The patient has continued to  follow a low-fat, low-cholesterol diet.   PAST MEDICAL HISTORY:  Pertinent for documented coronary disease.   CURRENT MEDICATIONS INCLUDE:  1. Folic acid 400 micrograms daily.  2. Toprol XL 25 mg daily.  3. Niaspan 1 g daily at bedtime.  4. Multivitamin daily.  5. Lipitor 60 mg daily.  6. Meloxicam 15 mg daily.  7. Aspirin 81 mg daily.  8. Nexium 40 mg daily.  9. Zetia 10 mg daily.  10.A vitamin B12 supplement daily.   REVIEW OF SYSTEMS:  As stated in the HPI, and otherwise negative.   PHYSICAL EXAM:  Weight today is 183 pounds, blood pressure is 130/68,  heart rate is 60.   LABORATORY:  Labs on March 06, 2007, reveal normal LFTs, slightly below  normal albumin of 3.2, total cholesterol 112, triglyceride 81, HDL 36.6  and LDL 59.   ASSESSMENT:  The patient meets primary, secondary and approaches  tertiary results for lipid lowering therapies.  Questions regarding  patient's Zetia use have been answered.  He will call with questions or  problems in the meantime.  I appreciate the opportunity to see this kind  patient.  I will see him back in six months for further lab work and  followup, as he is maintained in triple combination therapy.      Brandt Brandt, PharmD, BCPS, CPP  Electronically Signed      Brandt Rotunda, MD, [facility]  Electronically Signed   MP/MedQ  DD: 03/09/2007  DT: 03/10/2007  Job #: [id number]

## 2010-12-15 NOTE — Assessment & Plan Note (Signed)
[facility]                            CARDIOLOGY OFFICE NOTE   NAME:[name], [name]                       MRN:          [id number]  DATE:12/09/2006                            DOB:          09-09-1935    PRIMARY CARE PHYSICIAN:  Dr. [facility].   REASON FOR PRESENTATION:  Evaluate patient with coronary disease.   HISTORY OF PRESENT ILLNESS:  The patient is now 75 years old.  He has  done well since I last saw him.  He denies any chest pressure, neck or  arm discomfort.  He denies any palpitations, presyncope or syncope.  He  exercises routinely on a bicycle and does a lot of active yard work.   PAST MEDICAL HISTORY:  1. Coronary artery disease (previous myocardial infarction, 2001, with      a 90% circumflex lesion, treated with PTCA, the LAD had 70%-80%      stenosis and was treated with angioplasty.  Most recent      catheterization, 2003, demonstrated the LAD had 30% mid stenosis,      circumflex 60%-70% stenosis in the mid AV groove, and 40% stenosis      in a previously stented area, the right coronary artery had 30%-40%      stenosis in the mid portion.  The EF is 55%.  He had PTCA and      stenting in the AV circumflex).  2. Previous syncope with negative electrophysiology testing.  3. Gastroesophageal reflux disease.  4. Hyperlipidemia.  5. Arthritis.   ALLERGIES:  None.   MEDICATIONS:  1. Folic acid 400 mg daily.  2. Toprol 25 mg daily.  3. Niacin 1000 mg daily.  4. Multivitamin.  5. Lipitor 60 mg daily.  6. Meloxicam 15 mg daily.  7. Aspirin 81 mg daily.  8. Nexium 40 mg daily.  9. Zetia 10 mg daily.   REVIEW OF SYSTEMS:  As stated in the HPI and otherwise negative for  other systems.   PHYSICAL EXAMINATION:  Patient is in no distress.  Blood pressure 110/68, heart rate 65 and regular, weight 180 pounds.  NECK:  No jugular venous distension, wave form within normal limits,  carotid upstroke brisk and  symmetrical.  No bruit.  No thyromegaly.  LYMPHATICS:  No cervical, axillary, inguinal adenopathy.  LUNGS:  Clear to auscultation bilaterally.  BACK:  No costovertebral angle tenderness.  CHEST:  Unremarkable.  HEART:  PMI not displaced or sustained.  S1 and S2 are within normal  limits.  No S3, no S4.  No clicks, no rubs, no murmurs.  ABDOMEN:  Flat, positive bowel sounds, normal in frequency and pitch.  No bruits, no rebound, no guarding.  No midline pulsatile mass.  No  organomegaly.  SKIN:  No rashes, no nodules.  EXTREMITIES:  2+ pulses, no edema.   EKG sinus rhythm, rate 65, axis within normal limits, intervals within  normal limits, no acute ST wave changes.   ASSESSMENT AND PLAN:  1. Coronary disease.  The patient is having no symptoms.  No further  cardiovascular testing is suggested.  He will continue with      secondary risk reduction.  2. Dyslipidemia.  He is followed in the lipid clinic and has had good      lipid profile, though his HDL remains very slightly below 40.  3. Followup.  I will see him back in about 18 months.  He knows to      call me if he has any chest discomfort.  Given his residual      nonobstructive disease, I would have a low threshold for stress      testing in the future with any chest symptoms.     Rollene Rotunda, MD, [facility]     JH/MedQ  DD: 12/09/2006  DT: 12/09/2006  Job #: [id number]   cc:   Tally Joe, M.D.

## 2010-12-15 NOTE — Assessment & Plan Note (Signed)
[facility]                               LIPID CLINIC NOTE   NAME:Colantonio, KALEP FULL                       MRN:          [id number]  DATE:03/07/2008                            DOB:          [birth year]    PAST MEDICAL HISTORY:  1. Hyperlipidemia.  2. Coronary artery disease.  3. Gastroesophageal reflux disease.   MEDICATIONS:  1. Folic acid 400 mg daily.  2. Niacin 1 g daily.  3. Multivitamin daily.  4. Lipitor 60 mg daily.  5. Meloxicam 15 mg twice daily.  6. Lopressor 25 mg twice daily.  7. Vitamin B12 daily.  8. Zetia 10 mg daily.  9. Nexium 40 mg daily.  10.Aspirin 81 mg daily.   PHYSICAL EXAMINATION:  VITAL SIGNS:  Weight 185 pounds, blood pressure  122/70, and heart rate 80.   LABORATORY DATA:  Total cholesterol 117, triglyceride 63, HDL 39, and  LDL 65.  LFTs within normal limits.   ASSESSMENT:  Mr. Debroux is a very pleasant 75 year old gentleman who  returns to Lipid Clinic today with no chest pain, no shortness of  breath, no muscle aches or pains.  He is compliant with current  medication regimen.  His daughter is a pharmacist who monitors his  medications regularly and also ensures that he is following a low-fat  and low-carbohydrate diet.  She does stress the importance of no fast  food and high-fat foods to him, and usually harps on him if she knows of  him going to or ordering some fast food restaurants.  He does exercise  by walking or riding an exercise bike.  He says that he does that on  most days that occasionally he has been a little bit slack in the  exercise that he had been doing that could be revealed or could be the  reason for his low HDL.  His total cholesterol is at goal of less than  200, triglycerides at goal of less than 150, HDL at goal of 40 or  greater; however, decreased from last visit and LDL at goal of less than  70.   PLAN:  1. To continue current medication regimen.  2. To reinforce exercise  regimen.  3. To decrease some fats in diet, as he has gained about 4-5 pounds      since last visit.  4. To follow up visit in 6 months for lipid panel and LFTs, and will      make changes that are needed at that time.      Leota Sauers, PharmD  Electronically Signed      Jesse Sans. Daleen Squibb, MD, [facility]  Electronically Signed   LC/MedQ  DD: 03/07/2008  DT: 03/08/2008  Job #: [id number]

## 2010-12-18 NOTE — Assessment & Plan Note (Signed)
[facility]                              CARDIOLOGY OFFICE NOTE   NAME:[name], [name]                       MRN:          [id number]  DATE:05/12/2006                            DOB:          [birth year]    ADDENDUM:  He was seen in the [facility] on May 12, 2006.  I  just want to request that a copy of the dictation from this visit be sent to  Dr. Tally Joe, M.D. at [facility].  Their phone number is [phone].            ______________________________  Charolotte Eke, PharmD     TP/MedQ  DD:  05/12/2006  DT:  05/13/2006  Job #:  [id number]   cc:   Tally Joe, M.D.

## 2010-12-18 NOTE — Discharge Summary (Signed)
[place]. [facility]  Patient:    [name], [name]                       MRN: [id number] Adm. Date:  [id number] Disc. Date: 01/24/00 Attending:  Rollene Rotunda Dictator:   Tereso Newcomer, P.A. CC:         Rollene Rotunda, M.D. [facility]             Dr. Dayton Scrape, Ph# [phone]                           Discharge Summary  DATE OF BIRTH:  1935-08-05  DISCHARGE DIAGNOSES: 1. Status post non-Q wave myocardial infarction, status post percutaneous    transluminal coronary angioplasty and stent to mid AV circumflex with    reduction in stenosis from 90% to 0%, status post percutaneous transluminal    coronary angioplasty to proximal left anterior descending with reduction in    stenosis from 70% to less than 10%. 2. Cardiac catheterization.  Results from January 21, 2000:  Left main mild.    Left anterior descending large vessel with distal diagonal 70-80% proximal    and large septal.  Left circumflex trivial first obtuse marginal, proximal    large second obtuse marginal, distal diffuse 50% mid AV circumflex with    focal 90% thrombus, distal obtuse marginal, right coronary artery dominant    50% mid.  No mitral regurgitation noted.  Ejection fraction greater than    55%, mild.  Abdominal aortogram without significant disease. 3. Gastroesophageal reflux disease. 4. Hyperlipidemia. 5. History of increased prostate-specific antigen. 6. Osteoarthritis. 7. Status post right knee surgery. 8. Status post tonsillectomy. 9. Status post appendectomy.  ADMISSION HISTORY:  This 75 year old male without prior cardiac history awoke at 11:30 on the evening of admission with substernal chest throbbing that he rated at an 8/10.  It was unlike any of his previous heartburn.  He noted positive nausea and diaphoresis.  Sublingual nitroglycerin via EMS provided him mild improvement.  He arrived at the emergency room at [facility] with mild lateral ST segment depression and a troponin of  1.8.  His CK was 176 and MB was 9.1.  His pain had been decreased to 2/10 after IV nitroglycerin and morphine.  He was subsequently transferred to [facility] for further evaluation.  PHYSICAL EXAMINATION:  GENERAL:  Admission physical exam revealed a male in no acute distress.  VITAL SIGNS:  Blood pressure 110/60, pulse 70.  NECK:  Without JVD or bruits.  LUNGS:  Clear to auscultation and percussion.  HEART:  Regular rate and rhythm.  EXTREMITIES:  2+ pulses.  No edema.  LABORATORY DATA:  Initial labs:  Hemoglobin 13.7, hematocrit 40.2, WBC 10,300, platelet count 243,000.  Sodium 144, potassium 4.0, chloride 109, CO2 26, BUN 18, creatinine 1.1.  EKG:  Normal sinus rhythm with mild lateral ST segment depression.  HOSPITAL COURSE:  The patient was taken to the cardiac catheterization lab on January 21, 2000.  Results are noted above.  The patient underwent a successful PTCA of the proximal LAD and PTCA and stenting of the mid AV circumflex.  The patient had no immediate complications.  His groin remained stable post-catheterization without hematoma.  It was suggested that the patient should undergo aggressive risk factor modification, given his severe two-vessel disease.  The patient remained stable throughout the weekend.  The patient was seen by cardiac rehab and  tolerated ambulation well.  Low-dose beta blocker was initiated.  Folic acid was added to the patients regimen. On the evening of January 24, 2000, it was felt that the patient was stable enough for discharge to home.  DISCHARGE MEDICATIONS: 1. Zantac 150 mg p.o. b.i.d. 2. Celebrex 200 mg p.o. q.d. 3. Enteric-coated aspirin 325 mg p.o. q.d. 4. Plavix 75 mg p.o. q.d. x four weeks. 5. Lipitor 10 mg p.o. q.h.s. 6. Nitroglycerin 0.4 mg sublingual p.r.n. chest pain. 7. Toprol XL 50 mg 1/2 tablet p.o. q.d. 8. Folate 400 mcg p.o. q.d.  ACTIVITY:  The patient is to refrain from driving for at least one week and  is to do no strenuous activity until cleared by the doctor.  DIET:  Low fat.  WOUND CARE:  The patient should call our office with concerns with increased bleeding, swelling, or drainage at the catheterization site.  FOLLOW-UP:  Follow-up is with Dr. Lindaann Slough P.A. in one week.  The patient is to call for an appointment.  Follow up with Dr. Antoine Poche on February 18, 2000 at 10:30 in the morning.  The patient should follow up with Dr. Dayton Scrape as needed.  DISCHARGE INSTRUCTIONS:  The patient can have ACE inhibitor initiated at follow-up if his blood pressure will tolerate.  The patient will also need lipid profile repeated in the office to assess his response to Lipitor. DD:  01/24/00 TD:  01/24/00 Job: [id number]

## 2010-12-18 NOTE — Cardiovascular Report (Signed)
[place]. [facility]  Patient:    ROCKFORD, LEINEN                      MRN: [id number] Proc. Date: 03/29/00 Attending:  Rollene Rotunda, M.D. [facility] CC:         Meredith Staggers, M.D.                        Cardiac Catheterization  DATE OF BIRTH:  1935/08/15  PRIMARY:  Meredith Staggers, M.D.  PROCEDURE:  Left heart catheterization/coronary arteriography.  INDICATIONS:  Evaluate patient with known coronary artery disease, status post PTCA of an LAD and stenting of a circumflex lesion in June 2001.  He was admitted to the hospital after a syncopal versus sudden cardiac death episode.  PROCEDURE NOTE:  Left heart catheterization was performed via the right femoral artery.  The artery was cannulated using an anterior wall puncture.  A #6 French arterial sheath was inserted via the modified Seldinger technique. Preformed Judkins and a pigtail catheter were utilized.  The patient tolerated the procedure well, and left the lab in stable condition.  RESULTS/HEMODYNAMICS:  LV 133/9.  AA 133/70.  CORONARIES: The left main had luminal irregularities.  The LAD had diffuse luminal irregularities including the mid segment which had previously been angioplastied.  The circumflex had proximal 40 to 50% stenosis unchanged from the previous description.  The mid circumflex stent was widely patent with no irregularities.  The right coronary artery was a dominant vessel.  There were diffuse luminal irregularities.  LEFT VENTRICULOGRAM:  The left ventriculogram was obtained in the RAO and LAO projections.  The EF was approximately 60% with normal wall motion.  CONCLUSION:  Nonobstructive coronary artery disease.  PLAN:  The patient will be seen by Dr. Alberteen Spindle for electrophysiology consultation and possible EP study. DD:  03/29/00 TD:  03/29/00 Job: [id number]

## 2010-12-18 NOTE — Op Note (Signed)
[place]. [facility]  Patient:    [name], [name]                       MRN: [id number] Proc. Date: 05/04/00 Adm. Date:  [id number] Disc. Date: [id number] Attending:  Nathen May CC:         Delsa Grana, R.N. [facility]  Tonna Corner, M.D., [place], [place].   Operative Report  PREOPERATIVE DIAGNOSIS:  Syncope.  POSTOPERATIVE DIAGNOSIS:  Syncope.  OPERATION:  Insertion of a implantable loop recorder.  SURGEON:  Nathen May, M.D.  DESCRIPTION OF PROCEDURE:  Following the attainment of informed consent, the patient was brought to the electrophysiology laboratory and placed on the fluoroscopic table in the supine position.  After routine prep and drape of the left upper chest an incision was made and carried down to the level of the prepectoral fascia using electrocautery.  A pocket was formed using electrocautery.  Hemostasis was obtained.  Thereafter, The LOOP was inserted.  Two 2-0 silk sutures were passed at the cephalad portion of the pocket and then threaded through the Reveal plus 9526 recorder.  Serial No.:  [id number].  The LOOP was inserted.  The pocket previously having been copiously irrigated with antibiotic containing saline solution the LOOP recorded was secured to the prepectoral fascia.  The wound was then closed in 3 layers in a normal fashion.  The wound was washed, dried and a benzoin Steri-Strip dressing was then applied.  Needle count, sponge count and instrument counts were correct at the end of the procedure according to the staff. DD:  05/04/00 TD:  05/05/00 Job: [id number]

## 2010-12-18 NOTE — Cardiovascular Report (Signed)
[place]. [facility]  Patient:    [name], [name]                       MRN: [id number] Proc. Date: 01/21/00 Adm. Date:  [id number] Attending:  Rollene Rotunda CC:         Veneda Melter, M.D. [facility]             Meredith Staggers, M.D.             Rollene Rotunda, M.D. [facility]                        Cardiac Catheterization  PROCEDURES PERFORMED: 1. Left heart catheterization. 2. Left ventriculogram. 3. Selective coronary angiography. 4. Abdominal aortogram. 5. PTCA of the LAD. 6. PTCA and stenting of the left circumflex artery.  DIAGNOSES: 1. Severe two-vessel coronary artery disease. 2. Normal left ventricular systolic function.  HISTORY:  [name] is a 75 year old gentleman with a history of hypercholesterolemia.  He was admitted to [facility] with substernal chest discomfort of acute onset.  The patient had T-wave inversion in the lateral leads, subsequently ruled in for a small non-Q-wave myocardial infarction.  He presents now for left heart catheterization.  TECHNIQUE:  After informed consent was obtained, the patient was brought to the catheterization lab and both groins were sterilely prepped and draped. Lidocaine 1% was used to infiltrate the right groin.  A 6-French sheath was placed in the right femoral artery using the modified Seldinger technique. Six-French JL4 and JR4 catheters were then used to engage the left and right coronary arteries.  Selective angiography was performed in various projections using manual injections of contrast.  A 6-French pigtail catheter was advanced to the left ventricle.  A left ventriculogram was performed using power injections of contrast.  The pigtail catheter was brought back into the descending aorta, and an abdominal aortogram performed using power injections of contrast.  INITIAL FINDINGS: 1. Left Main Trunk:  A large-caliber vessel with mild irregularities. 2. Left Anterior  Descending:  A large-caliber vessel that provides a diagonal    branch in its distal section.  There is mild, diffuse disease throughout    the LAD, with a focal narrowing of 70-80% in the proximal segment after the    takeoff of a large septal perforator.  The septal perforator has an acute    angulation in its proximal segment, and appears to be narrowed by 70-80% at    the ostium. 3. Left Circumflex Artery:  A large-caliber vessel that provided a trivial    first marginal branch in its proximal segment, and a large second marginal    branch distally.  The AV circumflex is diffusely diseased in the mid    section, with narrowings of 50%.  There is a focal narrowing of 90% prior    to the second marginal branch.  The second marginal branch is occluded in    the distal section; this appears due to thrombus. 4. Right Coronary Artery:  Dominant.  This is a medium-caliber vessel that    provides a posterior descending artery and a posterior ventricular branch    in its terminal segment.  There is mild, diffuse throughout the right    coronary system, with a focal narrowing of 50% in the mid section. 5. Left Ventricle:  Normal end-systolic and end-diastolic dimensions.  Overall    left ventricular function is well preserved,  with ejection fraction greater    than  55%.  There is no mitral regurgitation.  There is mild hypokinesis at    the anterolateral wall.  HEMODYNAMICS: 1. Left ventricular pressure:  100/10. 2. Aortic pressure:  100/60. 3. Left ventricular end-diastolic pressure:  20.  ABDOMINAL AORTOGRAM:  The abdominal aorta is of normal caliber, with no significant atheromatous buildup.  Renal arteries are single and patent bilaterally.  SUMMARY:  These findings were reviewed extensively with the patient.  He was presented with options including: bypass surgery versus multivessel angioplasty.  At this point the patient would like to proceed with percutaneous  intervention.  PERCUTANEOUS INTERVENTION:  The 6-French sheath in the right femoral artery was then exchanged for an 8-French sheath.  A 6-French venous sheath was placed in the right femoral vein.  Heparin and ReoPro were then administered on a weight-adjusted basis to maintain an ACT of greater than 250 sec.  Plavix (300 mg) was also given orally.  An 8-French Q4 catheter was then used to engage the left coronary artery, and a 0.014 inch Hi-Torque Floppy wire advanced into the first septal perforator. A 0.014 inch Trooper Extra-Support wire was advanced into the distal LAD.  A 2.0 x 15 mm Maverick balloon was then introduced into the septal perforator and used to predilate the ostial lesion.  A single inflation was performed at 8 atm for 60 sec.  A 3.5 x 10 mm cutting balloon was then introduced, and three inflations performed in the proximal LAD at the site of stenosis (4 atm for 30 sec).  Repeat angiography showed an excellent result, with less than 10% residual narrowing and no evidence of vessel damage.  There was TIMI-3 flow throughout the LAD and septal perforator.  This was deemed an acceptable result.  Attention was then turned to the left circumflex artery.  The short Hi-Torque Floppy wire advanced into the distal section of the second marginal branch.  A 3.5 x 15 mm NIR Elite stent was then primarily deployed, after carefully positioning under cineangiography in the mid AV circumflex at 6 atm for 30 sec.  A 3.5 x 12 mm Quantam Ranger balloon was then introduced, and two inflations performed at 12 atm for 30 sec within the distal and proximal sections of the stent.  The 2.0 x 15 mm Maverick balloon was then reintroduced and used to dilate the distal section of the second marginal branch and the area of thrombus formation at 6 atm for 30 sec, for a total of three inflations.  Repeat angiography showed an excellent result, with TIMI-3 flow throughout the left circumflex system;  no residual narrowing in the area of stenting.  The mid AV circumflex prior to the area of stenting had  moderate, diffuse disease and narrowings of 50% (which appeared worsened aftER stenting of the focal lesion).  The distal section of the second marginal branch had now been recanalized and there was improvement in flow through the distal segment of the vessel.  Angiography was then performed in various projections, showing no vessel damage.  We elected to leave the AV circumflex alone in the area of 50% narrowing; due to the increased risk of restenosis if we extended the stented segment.  Final angiography was then performed, showing TIMI-3 flow throughout the left coronary system.  The patient tolerated the procedure well.  The sheath was then secured into position.  He was transferred to the Floor in stable condition.  FINAL RESULTS: 1. Successful PTCA of the  proximal LAD.  With reduction of 70-80% narrowing    to less than 10%. 2. Successful PTCA and stenting of the mid AV circumflex.  With reduction of    a 90% narrowing to 0% with placement of a 3.5 x 15 mm NIR Elite stent.  ASSESSMENT AND PLAN:  Mr. Poynter is a 75 year old gentleman with multivessel coronary artery disease.  At this point we have intervened on the two most significant lesions.  Further percutaneous interventions may be considered to maintain patency, should the patient have restenosis.  However, he is aware that should his disease prove aggressive, coronary artery bypass graft surgery may be necessary in the future. DD:  01/21/00 TD:  01/23/00 Job: [id number]

## 2010-12-18 NOTE — Assessment & Plan Note (Signed)
[facility]                               LIPID CLINIC NOTE   NAME:[name], [name]                         MRN:          [id number]  DATE:09/08/2006                            DOB:          06-12-1936    Mr. Levitt comes in today with no acute complaints.  He has been  compliant with his cholesterol medications which include Lipitor 60 mg  daily, Niaspan 1 g daily, and Zetia 10 mg daily.  Other medications  include folic acid, Toprol-XL, multivitamin, Mobic, aspirin 81 mg,  Nexium, and Tylenol as needed.  He continues to have problems with the  balls of his feet and pains up in his legs at times.  He has been placed  on Mobic by his primary care physician and this has helped a little.   PHYSICAL EXAMINATION:  Weight is 184 pounds, blood pressure is 120/60,  heart rate 78.   LABORATORY DATA:  Includes total cholesterol 110, triglycerides 72, HDL  37.9, LDL 58.  Liver function tests are within normal limits.   ASSESSMENT:  Triglycerides and LDL are at goal.  HDL remains below goal  of greater than 40.  The pain he reports in his legs and the balls of  his feet sound like arthritis and he is following up with his primary  care physician for treatment of that.  He has been sticking to a heart-  healthy diet, although he does indulge in a steak or red meat once a  week or so.  He does try to have fish as much as possible.  Exercise  includes 15 minutes per day either on a stationary bike or on the  treadmill and he does this 4-5 days per week.   PLAN:  Continue with the same medications, increase his exercise as much  as tolerated, and continue on his heart-healthy diet.  We are going to  follow up with him in the clinic in 6 months and make any changes that  are necessary.      Charolotte Eke, PharmD  Electronically Signed      Rollene Rotunda, MD, [facility]  Electronically Signed   TP/MedQ  DD: 09/08/2006  DT: 09/08/2006  Job #: [id number]

## 2010-12-18 NOTE — Op Note (Signed)
   [name], [name]                          ACCOUNT NO.:  [id number]   MEDICAL RECORD NO.:  [id number]                   PATIENT TYPE:  OIB   LOCATION:  2853                                 FACILITY:  [facility]   PHYSICIAN:  Duke Salvia, M.D. [facility]           DATE OF BIRTH:  10/21/1935   DATE OF PROCEDURE:  10/23/2002  DATE OF DISCHARGE:                                 OPERATIVE REPORT   PREOPERATIVE DIAGNOSIS:  Syncope with previously-implanted Loop Recorder.   POSTOPERATIVE DIAGNOSIS:  Syncope with previously-implanted Loop Recorder.   PROCEDURE:  Explantation of Loop Recorder.   DESCRIPTION OF PROCEDURE:  Following obtaining of informed consent, the  patient was brought to the catheterization lab and placed on the  fluoroscopic table in the supine position. After routine prep and drape,  lidocaine was infiltrated in the line of the previous incision.  This was  carried down to the layer of the prepectoral parasternal pocket.  The Loop  Recorder was explanted.   The pocket was copiously irrigated with antibiotic-containing saline  solution, and 2-0 suture was used to oppose the anterior and posterior  aspects of the pocket about halfway down.  The wound was then closed in  three layers in a normal fashion.  Hemostasis have previously been assured.   Needle counts, sponge counts, and instrument counts were correct at the end  of the procedure according to the staff.   The wound was washed, and Benzoin and Steri-Strip dressing was applied.  The  patient tolerated the procedure without apparent complications.                                               Duke Salvia, M.D. [facility]    SCK/MEDQ  D:  10/23/2002  T:  10/23/2002  Job:  [id number]

## 2010-12-18 NOTE — Assessment & Plan Note (Signed)
[facility]                              CARDIOLOGY OFFICE NOTE   NAME:[name], [name]                       MRN:          [id number]  DATE:05/12/2006                            DOB:          27-Apr-1936    Mr. Follett comes in,  He is doing fine.  He has been tolerating his  cholesterol medicines, which include Lipitor 60 mg daily, Zetia 10 mg daily,  and niacin 1 g daily.  He does report some achy, tired legs and hips  recently.  As well, he also reports what sounds like arthritic pain in the  balls of his feet.  The flushing with niacin still happens occasionally but  has not been a problem lately.  Diet:  He continues to try to follow a heart-  healthy diet, although he admits to some indiscretions.  Exercise is limited  due to the arthritis pain in his feet.  He continues to ride a stationary  bike on a regular basis.  Other medicines have not changes.  They are folic  acid, Toprol-XL, multivitamin, Mobic, aspirin 81 mg, Nexium 40 mg.   PHYSICAL EXAMINATION TODAY:  VITAL SIGNS:  Blood pressure is 184 pounds,  blood pressure is 112/58, heart rate is 64.   LABORATORY DATA:  Total cholesterol 127, triglycerides 88, HDL 39.3, LDL 70.  Liver function tests are within normal limits.   ASSESSMENT:  Mr. Ewalt LDL has improved since starting Zetia and is at  goal or near goal of less than 70.  His HDL is basically the same as last  time and is not at goal of greater than 45.  Triglycerides remain at goal.  His reports of muscle aches and pains in his legs and hips is new, although  it does not appear to be severe, and the addition of Zetia is the only  medication change that has been made, and Zetia should not be causing any  myopathies.   PLAN:  We are going to continue the same medications, and see if we see a  continued improvement in the LDL.  I am hesitant to increase the niacin dose  secondary to the flushing that he still experiences from time  to time, but  that may be the only way to boost his HDL to within goal, in the goal range.  I encouraged him to improve his diet.  I gave him a handout on foods to  avoid and foods that are okay.  He is going to continue with the same  exercise program, and he has an appointment with his primary care doctor to  discuss the pains in the balls of his feet.   FOLLOWUP:  In four months.  We are going to check lipid and liver panels as  well as a creatine kinase.  He was encouraged to call us with any problems  or any changes in the severity or frequency of his muscle aches and pains.               ADDENDUM:  He was seen in the [facility]  on May 12, 2006.  I  just want to request that a copy of the dictation from this visit be sent to  Dr. Tally Joe, M.D. at [facility].  Their phone number is [phone].     ______________________________  Charolotte Eke, PharmD    ______________________________  Rollene Rotunda, MD, [facility]    TP/MedQ  DD:  05/12/2006  DT:  05/13/2006  Job #:  [id number]

## 2010-12-18 NOTE — Assessment & Plan Note (Signed)
[facility]                              CARDIOLOGY OFFICE NOTE   NAME:Monarch, Catlin                         MRN:          [id number]  DATE:02/17/2006                            DOB:          1936/06/23    PHARMACIST COMPLETING THE VISIT:  Verdene Rio   Mr. Toner is a 75 year old man who presents for followup in the lipid  clinic.  He has had no chest pain or complaints of muscle aches, pains,  weakness, fatigue.  Tolerating his current lipid-lowering regimen.  He is  maintained on plus Zetia.  He does have a pinched nerve which has caused  pain in the ball of his foot which is not related to the Lipitor/Zetia  combination therapy.  He still has some flushing on and off at night from  his niacin.  His diet remains healthy.  He exercises approximately five  times each week, 30 minutes at a time on his bicycle as well [place].   CURRENT MEDICATIONS:  Have been updated on the chart.   PAST MEDICAL HISTORY:  Notable for documented coronary disease.   LABORATORY:  Reveal triglycerides 62, total cholesterol 118, HDL 39, LDL 66.   PHYSICAL EXAMINATION:  Weight today is 185 pounds.  Blood pressure is  124/58, heart rate is 68.   ASSESSMENT:  The patient has hyperlipidemia and is approaching NCEP 3 goals.   PLAN:  The patient will continue on his Zetia 10 mg daily.  He will continue  on Lipitor 60 mg daily and niacin 1 g daily at bedtime.  He continue diet  and exercise therapy and follow up in three months.      ______________________________  Shelby Dubin, PharmD, BCPS    ______________________________  Rollene Rotunda, MD, [facility]    MP/MedQ  DD:  05/18/2006  DT:  05/19/2006  Job #:  [id number]

## 2010-12-18 NOTE — Discharge Summary (Signed)
[place]. [facility]  Patient:    [name], [name]                       MRN: [id number] Adm. Date:  [id number] Disc. Date: 03/31/00 Attending:  Rollene Rotunda Dictator:   Abelino Derrick, P.A.-C., [facility] CC:         Meredith Staggers, M.D.                           Discharge Summary  DISCHARGE DIAGNOSES: 1. Syncope of undetermined etiology. 2. Known coronary artery disease with stent to the circumflex and    angioplasty to the left anterior descending coronary artery on    January 21, 2000. 3. Hyperlipidemia.  HISTORY OF PRESENT ILLNESS:  The patient is a 75 year old male who had an SEMI treated with angioplasty on January 21, 2000.  He was apparently in rehabilitation on a treadmill at [facility] when he had a syncopal episode.  He apparently had brief CPR.  He was transferred to [facility] for further evaluation.  There was no arrhythmia documented.  HOSPITAL COURSE:  He was admitted to telemetry.  CPK, MB, and troponin obtained.  He ruled out for a myocardial infarction.  A cardiac catheterization was done on March 29, 2000, which revealed the LAD to be patent, the circumflex stent site was patent.  The RCA was patent.  His ejection fraction was 60%.  He underwent an EP study by Dr. Nathen May, which showed no inducible ventricular tachycardia.  He had an exercise stress test prior to discharge on March 31, 2000, and completed stage 4 of the Bruce protocol, with no arrhythmias, and no chest pain.  It was a negative adequate stress test.  DISPOSITION:  The patient is discharged later on March 31, 2000, and will follow up with Dr. Rollene Rotunda on April 28, 2000.  We will discuss with Dr. Graciela Husbands whether or not he needs an implantable loop recorder.  DISCHARGE MEDICATIONS: 1. Lipitor 10 mg q.d. 2. Toprol XL 25 mg q.d. 3. Folate once q.d. 4. Coated aspirin q.d. 5. Nitroglycerin sublingual p.r.n.  LABORATORY DATA:   Sodium 141, potassium 3.9, BUN 17, creatinine 1.3, magnesium 2.3.  White count 8.7, hemoglobin 14.3, hematocrit 42.5, platelet count 211. Lipid profile shows an HDL of 28, LDL 89.  CPK and troponin from [facility] are negative.  PO2 was 70.  Electrocardiogram shows a normal sinus rhythm with no acute changes.  CONDITION ON DISCHARGE:  The patient is discharged in stable condition.  FOLLOWUP:  He will follow up with Dr. Antoine Poche.  INSTRUCTIONS:  He has been instructed not to drive until seen by Dr. Antoine Poche or by Dr. Graciela Husbands and cleared. DD:  03/31/00 TD:  03/31/00 Job: [id number]

## 2010-12-18 NOTE — Op Note (Signed)
[place]. [facility]  Patient:    [name], [name]                       MRN: [id number] Proc. Date: 03/30/00 Adm. Date:  [id number] Attending:  Rollene Rotunda CC:         Electrophysiology Laboratory             Meredith Staggers, M.D.             [place] Office - Attention:  Delsa Grana                           Operative Report  PREOPERATIVE DIAGNOSIS:  Syncope.  POSTOPERATIVE DIAGNOSIS:  No inducible arrhythmias.  PROCEDURE:  Invasive electrophysiological study.  CARDIOLOGIST:  Nathen May, M.D.  INDICATIONS:  [name] is a 75 year old gentleman with a history of a myocardial infarction involving the circumflex coronary artery in July 2001. During rehabilitation, he had an episode of immediate post-exercise syncope that was abrupt in onset and abrupt in offset.  He is admitted for an electrophysiological testing.  A cardiac catheterization demonstrated normal left ventricular function overall, and no obstructive coronary artery disease.  DESCRIPTION OF PROCEDURE:  The cardiac catheterization was performed with local anesthesia and conscious sedation.  Noninvasive blood pressure monitoring, transcutaneous oxygen saturation monitoring were performed continuously throughout the procedure.  Following the procedure the catheters were removed.  Hemostasis was obtained.  The patient was transferred to the floor in stable condition.  CATHETERS:   A 5-French quadripolar catheter was inserted via the left femoral vein to the AV junction, and subsequently into the right ventricular outflow tract, and then back to the AV junction.  A 5-French quadripolar catheter was inserted in the left femoral vein to the right ventricular apex, and subsequently into the AV junction, and then to the high right atrium.  Surface leads I, aVF, and V1 were monitored continuously throughout the procedure.  Following the insertion of the catheters, the stimulation  protocol included incremental atrial pacing, incremental ventricular pacing, single atrial extra stimuli, at a paced cycle length of 600 msec, single, double, and triple ventricular extra stimuli from the right ventricular apex, and the right ventricular outflow tract.  RESULTS: SURFACE ELECTROCARDIOGRAM: Rhythm:  Sinus. Cycle length:  1087 msec. PR interval:  198 msec. QRS duration:  90 msec. QT interval:  411 msec. P-wave duration:  149 msec. Bundle branch block Absent. Pre-excitation:  Absent.  AV NODAL FUNCTION: AH interval:  66 msec. AV Wenckebach:  Occurred at 400 msec. VA conduction:  Dissociated 550, with Wenckebach intermittently at 600 msec. AV nodal effective refractory period at a paced cycle length of 600 msec, was 360 msec.  HIS-PURKINJE SYSTEM FUNCTION: HV interval:  43 msec. His bundle duration:  14 msec.  ACCESSORY PATHWAY FUNCTION:  No evidence of an accessory pathway was identified.  VENTRICULAR RESPONSE TO PROGRAMMED STIMULATION: Effective refractory period at the right ventricular apex at a paced cycle length of 600 msec was 250 msec, and at 400 msec was 240 msec.  Effective refractory period at the right ventricular outflow tract at a paced cycle length of 600 msec was 250 msec, and at 400 msec was 270 msec.  The closest coupling interval at the right ventricular apex at a paced cycle length of 600 msec was 260:210:210, and at 400 msec was 230:210:210.  The closest coupling interval at the right ventricular outflow tract  at a paced cycle length of 600 msec was 270:220:210, and at 400 msec was 220:200:200.  Arrhythmias induced:  None.  IMPRESSION: 1. Normal sinus function, possible bradycardia. 2. Modest prolongation of the intra-atrial conduction times. 3. Normal atrioventricular nodal function without evidence of dual    atrioventricular nodal physiology. 4. Normal His-Purkinje system function. 5. No accessory pathway. 6. Normal ventricular  response to programmed stimulation.  SUMMARY/CONCLUSION:  The results of the electrophysiological testing failed to identify a mechanism of [name] syncope.  Given the effect that it was post-exercise induced, he will be submitted for a treadmill testing.  In the event that this is unrevealing, given his substrate for arrhythmias, with his prior myocardial infarction, the abruptness of his symptoms, and the rapid release following the resolution, I would recommend a consideration of an implantable loop recorder. DD:  03/30/00 TD:  03/31/00 Job: [id number]

## 2010-12-18 NOTE — Assessment & Plan Note (Signed)
[facility]                               LIPID CLINIC NOTE   NAME:Brandt, Jack PERNELL                       MRN:          [id number]  DATE:09/11/2007                            DOB:          September 06, 1935    [name] is seen back in lipid clinic for further evaluation and  medication titration associated with his hyperlipidemia in the setting  of documented coronary artery disease. He has been compliant with his  regimen which includes Lipitor, Niaspan and Zetia. He is tolerating all  of his medication although he does report some flushing with Niaspan,  but he is taking his medication. His daughter is a Teacher, early years/pre and he has  excellent adherence. He does not use tobacco although he did smoke a  half pack per day for 2 years when he was younger in the [employer] and  does not drink alcohol. He is adherent to a low fat diet although he  admits that he cheats on his diet. Sometimes his breakfasts usually  consists of Cheerios, coffee with low fat milk. He normally does not eat  lunch and for dinner he will eat fish once a week and also will eat  grilled chicken. For exercise, he is exercising 5 days a week for 30  minutes. He says he was walking however his hip is hurting him and  because of this he has switched to the bike. He says that his hip pain  has improved after he received a cortisol shot. He also reports taking  Tylenol for the pain. He does work on a farm.   PAST MEDICAL HISTORY:  Documented coronary artery disease.   CURRENT MEDICATIONS:  1. Folic acid 400 mg daily.  2. Toprol XL 25 mg daily.  3. Niacin 1000 mg daily.  4. Multivitamin daily.  5. Lipitor 60 mg daily.  6. Mobic or meloxicam 15 mg daily.  7. Aspirin 81 mg daily.  8. Nexium 40 mg daily.  9. Zetia 10 mg daily.  10.Over-the-counter B12 vitamin.  11.Tylenol as needed.   REVIEW OF SYSTEMS:  As stated in the history of present illness and  otherwise negative.   PHYSICAL  EXAMINATION:  Weight is down 2 pounds to 181. Blood pressure is  142/66, heart rate of 80. He is a little bit concerned about his blood  pressure. He reports that usually it is not this high and that it was  high another time when he was at Dr. Azucena Cecil, his primary care office. On  repeat, blood pressure in the right arm was 148/84.   LABORATORY DATA:  Cholesterol panel from September 04, 2007 showed  cholesterol of 120, triglycerides of 76, HDL cholesterol of 44, LDL  cholesterol of 60 and a very low dense lipid protein of 15.   ASSESSMENT:  The patient meets primary, secondary and tertiary goals for  lipid-lowering therapies. His lipid panel has actually improved since  August. The total cholesterol is about the same, triglycerides are  lower, his HDL has improved with the Niaspan and the LDL is at goal of  less than  70. He is tolerating his therapy. He states that the flushing  that he experiences with Niaspan is not too bad and he has just learned  to live with it and he is doing great. We will see him back in 6 months  for further lab work and followup as he is maintained in triple  combination therapy.   ADDENDUM:  I failed to mention that we gave him three months' supply refills for  his Zetia, Niaspan, Toprol XL and Lipitor to his Recruitment consultant.     Shelby Dubin, PharmD, BCPS, CPP  Electronically Signed    MP/MedQ  DD: 09/12/2007  DT: 09/14/2007  Job #: [id number]   cc:   Tally Joe, M.D.

## 2010-12-18 NOTE — Discharge Summary (Signed)
. [facility]  Patient:    [name], [name] Visit Number: [id number] MRN: [id number]          Service Type: CAT Location: 6500 [id number] Attending Physician:  Veneda Melter Dictated by:   Lavella Hammock, P.A. Admit Date:  01/10/2002 Discharge Date: 01/11/2002   CC:         Meredith Staggers, M.D. in primary care   Referring Physician Discharge [place]  DATE OF BIRTH:  July 12, 1936  PROCEDURES: 1. Cardiac catheterization. 2. Coronary arteriogram. 3. Left ventriculogram. 4. PTCA and stent to one vessel.  HOSPITAL COURSE:  The patient is a 75 year old male with a history of percutaneous intervention to his LAD and circumflex during prior catheterizations.  He was seen in the office on January 05, 2002 after being evaluated for chest pain and a Cardiolite was performed.  The Cardiolite was positive for ischemia in the inferolateral wall and because of this he was scheduled for a cardiac catheterization on January 10, 2002.  The cardiac catheterization showed mild left main disease and an LAD with a 30% mid stenosis.  The circumflex had a 60-70% stenosis in the mid AV circumflex and 40% in-stent restenosis.  The RCA was dominant and had a 30-40% stenosis in the mid portion.  His EF was 55% with no MR.  He had PTCA and stent to the AV circumflex, reducing that stenosis from 70% to 0 with TIMI 3 flow.  He had Angiomax post procedure.  The patient had chest pain post procedure but it was mild and relieved with a combination of Lortabs and IV nitroglycerin.  Later that night, the patient was evaluated by Dr. Chales Abrahams and was hemodynamically stable and pain free.  The next day, his groin was stable and he had no difficulties with ambulation. His laboratory values were acceptable and he had no further episodes of chest pain with ambulation.  He was considered stable for discharge on January 11, 2002.  LABORATORY DATA:  Hemoglobin 12.3, hematocrit 36.5, wbcs  8.0, platelets 151. Sodium 143, potassium 4.3, chloride 110, CO2 28, BUN 14, creatinine 1.0, glucose 112.  Chest x-ray:  Normal cardiomediastinal silhouette and the lungs are clear. There is no evidence of acute pulmonary disease.  DISCHARGE CONDITION:  Improved.  DISCHARGE DIAGNOSES:  1. Chest pain secondary to unstable anginal pain.  2. Status post percutaneous transluminal coronary angioplasty and stent to     the AV circumflex this admission.  3. Status post percutaneous transluminal coronary angioplasty and stent to     the more distal circumflex in June 2001.  4. Status post percutaneous transluminal coronary angioplasty of the left     anterior descending artery in 2001.  5. Preserved left ventricular function.  6. History of syncopal episode; loop monitor in place.  7. Hyperlipidemia.  8. Status post electrophysiology study without inducible arrhythmia.  9. History of elevated PSA. 10. History of degenerative joint disease. 11. Status post appendectomy and arthroscopy on his knee.  DISCHARGE INSTRUCTIONS:  ACTIVITY:  His activity level is to include no driving for two days and no strenuous activity for one week.  He may return to work half time on Monday if he feels well.  DIET:  He is to stick to a low fat diet.  WOUND CARE:  He is to call the office for problems with the catheterization site.  FOLLOW-UP:  He is to get a loop recorder checked on February 19, 2002.  He is to see  the P.A. for Dr. Graciela Husbands on January 29, 2002 at 9:15 a.m.  He is to follow up with Dr. Dayton Scrape as needed.  DISCHARGE MEDICATIONS:  1. Coated aspirin 325 mg q.d.  2. Toprol-XL 25 mg q.d.  3. Vioxx 25 mg q.d.  4. Multivitamin q.d.  5. Zantac 150 mg b.i.d.  6. Lipitor 20 mg q.d.  7. Folic acid q.d.  8. Plavix 75 mg q.d.  9. Nitroglycerin 0.4 mg as needed. 10. Vitamin C and vitamin E as taken prior to admission. Dictated by:   Lavella Hammock, P.A. Attending Physician:  Veneda Melter DD:   01/11/02 TD:  01/11/02 Job: [id number]

## 2010-12-18 NOTE — Assessment & Plan Note (Signed)
[place] [facility]                               LIPID CLINIC NOTE   NAME:[name], [name]                       MRN:          [id number]  DATE:09/11/2007                            DOB:          Feb 08, 1936    ADDENDUM:  I failed to mention that we gave him three months' supply refills for  his Zetia, Niaspan, Toprol XL and Lipitor to his Recruitment consultant.     Shelby Dubin, PharmD, BCPS, CPP  Electronically Signed    MP/MedQ  DD: 09/12/2007  DT: 09/13/2007  Job #: [id number]   cc:   Tally Joe, M.D.

## 2010-12-18 NOTE — Cardiovascular Report (Signed)
[place]. [facility]  Patient:    [name], [name] Visit Number: [id number] MRN: [id number]          Service Type: CAT Location: 6500 [id number] Attending Physician:  Veneda Melter Dictated by:   Veneda Melter, M.D. [facility] Proc. Date: 01/10/02 Admit Date:  01/10/2002 Discharge Date: 01/11/2002   CC:         Rollene Rotunda, M.D. [facility]  Nathen May, M.D., [facility]  Meredith Staggers, M.D.   Cardiac Catheterization  PROCEDURE: 1. Left heart catheterization. 2. Left ventriculogram. 3. Selective coronary angiography. 4. Primary stent placement mid AV circumflex. 5. Duet closure right femoral artery.  DIAGNOSES: 1. Coronary artery disease. 2. Normal left ventricular systolic function.  INDICATIONS:  Mr. Chui is a 75 year old gentleman with a prior history of non-Q wave myocardial infarction in June of 2001 who underwent PTCA of the proximal mid LAD and percutaneous stent placement to the mid AV circumflex. He had well preserved LV function.  He has done well in the interim, however, he has been assessed for syncope and we placed a loop recorder and negative EP study. A recent Cardiolite study showed ischemia in the inferolateral wall and he presents for further assessment.  DESCRIPTION OF PROCEDURE:  Informed consent was obtained.  The patient was brought to the catheterization lab.  A 6 French sheath was placed in the right femoral artery using modified Seldinger technique.  A 6 [place] and JR4 catheters were then used to engage the left and right coronary arteries and selective angiography performed in various projections using manual injections of contrast.  A 6 French pigtail catheter was then advanced to the left ventricle and left ventriculogram performed using power injections of contrast.  INITIAL FINDINGS: 1. Left main trunk a large caliber vessel with mild irregularities. 2. LAD is a medium caliber vessel that provides two small  diagonal branches    in the distal segment.  The LAD has mild diffuse disease at 30% in the    midsection. 3. Left circumflex artery is a large caliber vessel that provides a trivial    first marginal branch in the proximal segment and a large second marginal    branch distally.  There is evidence of sequential narrowings of 50 to 60    and 70% in the midsection of the AV circumflex.  There is a focal narrowing    of 40% in the midsection of the old stent also in the mid AV circumflex.    The distal section of the marginal branch has mild irregularities. 4. Right coronary artery is dominant.  This is a small caliber vessel that    provides a posterior descending artery and a posterior ventricular branch.    There is mild diffuse disease of 30 to 40% in the midsection of the right    coronary artery. 5. LV; normal end systolic and end diastolic dimensions.  Overall left    ventricular function is well preserved.  Ejection fraction is greater than    65%. No mitral regurgitation.  Left ventricular pressure 130/0, aortic    130/65, LVEDP equals 12.  INTERVENTION PROCEDURE:  With these findings, we elected to proceed with percutaneous intervention to the mid AV circumflex.  The patient was given 300 mg of Plavix orally and Angiomax on weight-adjusted basis.  A 6 French CLS 3.5 guide catheter was used to engage the left coronary artery and 0.014 inch Forte wire advanced to the distal section of the  second marginal branch.  A 3.5 x 20 mm Express II stent was introduced.  Carefully positioned in the mid AV circumflex extending up to the previously placed stent with slight overlap and deployed at 12 atmospheres for 60 seconds.  Repeat angiography after the administration of intracoronary nitroglycerin showed an excellent result, no residual stenosis, and no evidence of vessel damage with TIMI 3 flow through the vessel.  Final angiography was then performed in various projections.  The guide  catheter was then removed and a Duet closure device deployed to the right femoral artery.  Adequate hemostasis was achieved and the patient was transferred to the floor in stable condition.  FINAL RESULTS:  Successful primary stent placement to the mid AV circumflex with reduction of sequential 60 and 70% narrowings to 0% with placement of 3.5 x 20 mm Express II stent. Dictated by:   Veneda Melter, M.D. [facility] Attending Physician:  Veneda Melter DD:  01/10/02 TD:  01/12/02 Job: [id number]

## 2010-12-18 NOTE — Consult Note (Signed)
[place]. [facility]  Patient:    [name], [name]                       MRN: [id number] Adm. Date:  [id number] Attending:  Rollene Rotunda CC:         Rollene Rotunda, M.D. [facility]  Electrophysiology Office  [place], attention Delsa Grana   Consultation Report  HISTORY OF PRESENT ILLNESS:  Thanks very much for allowing me Mr. [name] in electrophysiology consultation for _____ and ischemic heart disease.  Mr. 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  This was treated with PTCA and stenting of his circumflex artery.  Some anterolateral hypokinesis was noted in the RAO view.  It is not clear whether an LAO ventriculogram was undertaken.  The patient has had no intercurrent problems.  He has gone through rehabilitation and has been functioning at a level of greater than 14 METs.  While at rehab on last Friday, having completed a 30-minute workout of walking, treadmill, and Stairmaster, he got off the treadmill, became lightheaded.  He told the nurse that he was going to faint.  They sat him in a chair, at which point he did faint and lose consciousness.  He was placed on the floor.  No pulse was palpable.  CPR was started.  A couple of chest compressions were undertaken, a code was called.  Dr. Pollie Brandt arrived.  He was within 50 yards by his arrival.  Assessment of vital signs demonstrated normal blood pressure and heart rate.  The patient was diaphoretic but, as noted, the patient had been exercising.  The patient denied nausea or fatigue.  The patient had no associated palpitations.  He had no prior history of palpitations.  He does have a prior history of syncope some years ago associated with phlebotomy.  He has also had recurrent episodes of severe lightheadedness and some syncope with post micturition, especially at night.  PAST MEDICAL  HISTORY:  Notable for mildly elevated PSA and some arthritis.  PAST SURGICAL HISTORY:  Notable for appendectomy and arthroscopic knee surgery.  SOCIAL HISTORY:  He is married.  He has two children.  He lives in [place]. He denies using cigarettes or alcohol.  REVIEW OF SYSTEMS:  His review of symptoms is not contributory.  MEDICATIONS:  Metoprolol 25 mg, aspirin, Pepcid, and Zocor.  ALLERGIES:  No known drug allergies.  PHYSICAL EXAMINATION:  GENERAL:  He is an older Caucasian male in no acute distress.  VITAL SIGNS:  Blood pressure is 114/58, his pulse is 58, respirations are 14 and unlabored.  HEENT:  No scleral icterus or xanthoma.  NECK:  His neck veins were 6 cm.  His carotids were brisk bilaterally without bruits.  BACK:  Without kyphosis, scoliosis.  LUNGS:  Clear.  HEART:  Heart sounds were regular without murmurs or gallops.  ABDOMEN:  Soft with active bowel sounds, without midline pulsation or hepatomegaly.  EXTREMITIES:  Femoral pulses were 2+.  Distal pulses were intact.  Extremities were without peripheral edema, clubbing, or cyanosis.  DIAGNOSTIC EVALUATION:  Electrocardiogram dated August 25 demonstrates sinus rhythm at 66 with intervals of 0.15/0.08/0.41.  There are minor T-wave abnormalities.  Telemetry is notable for an occasional PVC and some degree of sinus bradycardia.  IMPRESSION: 1. Syncope post exercise with:    a. Some warning.    b.  Prior vasovagal syncope.    c. Post-micturition syncope. 2. Coronary artery disease with:    a. Prior myocardial infarction with an RAO anterolateral hypokinesis at       the time of his infarct.    b. Normal ejection fraction.    c. No obstructive coronary disease at catheterization yesterday.    d. Class I symptoms. 3. Sinus bradycardia with occasional PVC.  DISCUSSION:  [name] had syncope post exercise that occurred with some warning.  Unfortunately, no documentation of rhythm is available.  There  was "no pulse, and cardiac massage was started."  The patients prior syncopal history suggests a predisposition to nearly immediate syncope; however, the patient also has a substrate for malignant causes of syncope given his prior infarct.  The risks for sudden death, however, would be estimated to be low overall given his essentially normal ejection fraction.  RECOMMENDATIONS: 1. Undertake stress testing. 2. Undertake electrophysiological study. 3. Further recommendations will follow the aforementioned testing.  Thank you for this consultation. DD:  03/30/00 TD:  03/30/00 Job: [id number]

## 2011-01-12 ENCOUNTER — Other Ambulatory Visit (HOSPITAL_COMMUNITY): Payer: Self-pay | Admitting: Orthopedic Surgery

## 2011-01-12 ENCOUNTER — Encounter (HOSPITAL_COMMUNITY)
Admission: RE | Admit: 2011-01-12 | Discharge: 2011-01-12 | Disposition: A | Discharge status: Home / Self Care | Payer: 59 | Source: Ambulatory Visit | Attending: Orthopedic Surgery | Admitting: Orthopedic Surgery

## 2011-01-12 ENCOUNTER — Ambulatory Visit (HOSPITAL_COMMUNITY)
Admission: RE | Admit: 2011-01-12 | Discharge: 2011-01-12 | Disposition: A | Discharge status: Home / Self Care | Payer: 59 | Source: Ambulatory Visit | Attending: Orthopedic Surgery | Admitting: Orthopedic Surgery

## 2011-01-12 DIAGNOSIS — M19019 Primary osteoarthritis, unspecified shoulder: Secondary | ICD-10-CM | POA: Insufficient documentation

## 2011-01-12 DIAGNOSIS — Z01810 Encounter for preprocedural cardiovascular examination: Secondary | ICD-10-CM | POA: Insufficient documentation

## 2011-01-12 DIAGNOSIS — M19011 Primary osteoarthritis, right shoulder: Secondary | ICD-10-CM

## 2011-01-12 DIAGNOSIS — Z01812 Encounter for preprocedural laboratory examination: Secondary | ICD-10-CM | POA: Insufficient documentation

## 2011-01-12 DIAGNOSIS — Z01811 Encounter for preprocedural respiratory examination: Secondary | ICD-10-CM | POA: Insufficient documentation

## 2011-01-12 LAB — COMPREHENSIVE METABOLIC PANEL
CO2: 28 mEq/L (ref 19–32)
Calcium: 9.9 mg/dL (ref 8.4–10.5)
Creatinine, Ser: 1.21 mg/dL (ref 0.4–1.5)
GFR calc Af Amer: >60 mL/min (ref >60)
GFR calc non Af Amer: 59 mL/min — ABNORMAL LOW (ref >60)
Glucose, Bld: 110 mg/dL — ABNORMAL HIGH (ref 70–99)
Total Protein: 6.4 g/dL (ref 6.0–8.3)

## 2011-01-12 LAB — CBC
Hemoglobin: 13.7 g/dL (ref 13.0–17.0)
MCH: 31.4 pg (ref 26.0–34.0)
MCHC: 33.8 g/dL (ref 30.0–36.0)
Platelets: 171 10*3/uL (ref 150–400)
RBC: 4.36 MIL/uL (ref 4.22–5.81)

## 2011-01-12 LAB — TYPE AND SCREEN
ABO/RH(D): A POS
Antibody Screen: NEGATIVE

## 2011-01-12 LAB — URINALYSIS, ROUTINE W REFLEX MICROSCOPIC
Ketones, ur: NEGATIVE mg/dL
Leukocytes, UA: NEGATIVE
Nitrite: NEGATIVE
Protein, ur: NEGATIVE mg/dL
pH: 6 (ref 5.0–8.0)

## 2011-01-12 LAB — PROTIME-INR: Prothrombin Time: 13 seconds (ref 11.6–15.2)

## 2011-01-14 ENCOUNTER — Ambulatory Visit (HOSPITAL_COMMUNITY)
Admission: RE | Admit: 2011-01-14 | Discharge: 2011-01-15 | Disposition: A | Discharge status: Home / Self Care | Payer: 59 | Source: Ambulatory Visit | Attending: Orthopedic Surgery | Admitting: Orthopedic Surgery

## 2011-01-14 DIAGNOSIS — Z23 Encounter for immunization: Secondary | ICD-10-CM | POA: Insufficient documentation

## 2011-01-14 DIAGNOSIS — M19019 Primary osteoarthritis, unspecified shoulder: Secondary | ICD-10-CM | POA: Insufficient documentation

## 2011-01-14 HISTORY — PX: TOTAL SHOULDER ARTHROPLASTY: SHX126

## 2011-01-28 ENCOUNTER — Encounter: Payer: Self-pay | Admitting: Cardiology

## 2011-02-06 NOTE — Op Note (Signed)
NAMEHENSON, FRATICELLI                ACCOUNT NO.:  [id number]  MEDICAL RECORD NO.:  [id number]  LOCATION:  5039                         FACILITY:  [facility]  PHYSICIAN:  Vania Rea. Mayci Haning, M.D.  DATE OF BIRTH:  20-Oct-1935  DATE OF PROCEDURE:  01/14/2011 DATE OF DISCHARGE:                              OPERATIVE REPORT   PREOPERATIVE DIAGNOSIS:  End-stage right shoulder glenohumeral arthrosis.  POSTOPERATIVE DIAGNOSIS:  End-stage right shoulder glenohumeral arthrosis.  PROCEDURE:  Right total shoulder arthroplasty utilizing a DePuy global implant Press-Fit, size 16 humeral stem with a 48 x 21 eccentric humeral head and a cemented pegged 40 glenoid.  SURGEON:  Vania Rea. Raley Novicki, MD  ASSISTANT:  Lucita Lora. Shuford, PA-C  ANESTHESIA:  General endotracheal as well as an interscalene block.  ESTIMATED BLOOD LOSS:  300 mL.  DRAINS:  None.  HISTORY:  Mr. Cerro is a 75 year old gentleman who has had chronic right shoulder pain with severe restrictions in mobility, increasing functional limitations secondary to end-stage osteoarthrosis.  His radiographs were notable for significant glenoid retroversion as well as inferior erosion of the glenoid.  Due to his ongoing pain and functional limitations, he is brought to the operating room at this time for planned right total shoulder arthroplasty.  Preoperatively, I had counseled Mr. Kimmons on treatment options as well as risks versus benefits thereof.  Possible surgical complications were reviewed including potential for bleeding, infection, neurovascular injury, persistent pain, loss of motion, anesthetic complication, failure of the implant, and possible need for additional surgery.  He understands and accepts and agrees with our planned procedure.  PROCEDURE IN DETAIL:  After undergoing routine preop evaluation, the patient received prophylactic antibiotics and an interscalene block was established in the holding area by the Anesthesia  Department.  He was placed supine on the operating table, underwent smooth induction of general endotracheal anesthesia.  He was placed in beach-chair position and appropriately padded and protected.  The right shoulder girdle region was then sterilely prepped and draped in a standard fashion.  A time-out was called.  Prophylactic antibiotics were given.  An anterior broach of the right shoulder was made through deltopectoral incision approximately 15 cm in length.  Skin flaps were elevated sharply and electrocautery was used for hemostasis.  The deltopectoral interval was identified and developed bluntly with the cephalic vein retracted laterally.  Conjoined tendon was identified and retracted medially and the deltopectoral interval was developed distally.  The upper centimeter of pectoralis major tendon was tenotomized to improve visualization. Adhesions in the subacromial/subdeltoid bursa were divided and deltoid was retracted laterally.  Conjoined tendon was retracted medially.  The bicipital groove was identified, unroofed and biceps tendon was then tenotomized.  We then performed a tenotomy of the subscapularis and then divided back to the base of the coracoid and then the free margin of the subscapularis was tagged with a series of grasping #2 FiberWire sutures and a cuff approximately 2 cm of tissue was left attached to the lesser tuberosity.  The anterior humeral circumflex vessels were identified, electrocoagulated and then dissection carried inferiorly about the inferior aspect of the humeral head allowing the delivery of the liver humeral head through the wound  and releasing the capsule to the posterior aspect of the humeral head.  We then outlined a proposed humeral head resection using the extramedullary guide and maintaining approximately 30 degrees of retroversion.  The oscillating saw was then used to make the humeral head cut.  We then used a rongeur to remove  the prominent peripheral osteophytes around the primarily anterior and inferior aspects of the humeral head.  Care was taken to protect the rotator cuff throughout this portion.  We then used a series of hand reamers to gain access into the humoral medullary canal and this reamed up to size 16 which allowed excellent fit.  We then used the cookie cutter broach to prepare the metaphyseal region and then broached up to size 16 maintaining approximately 30 degrees of retroversion.  Overall alignment was much to our satisfaction.  At this point, we then removed the broach, placed a metal cap across the cut surface of the proximal humerus, and then proceeded with exposure of the glenoid which was actually quite challenging due to the degree of retroversion, and marked contractures about the shoulder.  We sequentially released the capsule attachments of the periphery of the glenoid and removed the retained labral tissue as well as the proximal stump of the biceps tendon.  We circumferentially mobilized the subscapularis.  We released inferiorly about the glenoid as well as posteriorly and ultimately gained access to the entire periphery of the glenoid and this did show severe posterior erosion.  With this finding, we identified the proposed starting point for reaming in the center of the glenoid and performed a correction of the severe retroversion utilizing a reamer and recreating a neutral alignment of the glenoid and then used a rongeur to remove peripheral osteophyte and residual bone.  Due to the degree of reaming required, the size 40 provided the best coverage over the residual glenoid vault. We then placed a central drill hole and peripheral peg holes using the appropriate guides.  The trial was placed showing good bony purchase and fit and good stability.  The glenoid was then irrigated and dried. Cement was mixed in the appropriate consistency.  We introduced cement into the peg holes  and the final size 40 glenoid was impacted into position obtaining excellent stability and good fixation.  We then confirmed that there was no residual peripheral bone to impinge upon the glenohumeral articulation and once this was completed to our satisfaction we then returned our attention to the proximal humerus.  We then re-broached and confirmed overall good alignment and then selected the size 16 stem and impacted this into position maintaining proper degree of retroversion and excellent interference fit was achieved.  We then performed trial reductions and the 48 x 21 eccentric head provided the best coverage of the proximal humerus.  The final humeral head was impacted into position after the [facility] taper was meticulously cleaned. Ultimate stability showed approximately 50% translation of the humeral head across the glenoid with excellent soft tissue balance.  At this point, the final irrigation was completed.  Hemostasis was obtained. The subscapularis was repaired back to the lesser tuberosity with the #2 FiberWire and we closed the rotator interval between the subscap and supraspinatus laterally with a figure-of-eight #2 FiberWire suture. Excellent shoulder mobility and stability was achieved at this point. We then completed the biceps tenodesis using the FiberWire.  The deltopectoral interval was then reapproximated and closed with 0 Vicryl. 2-0 Vicryl was used for the subcu layer and intracuticular 3-0 Monocryl for  the skin followed by Steri-Strips.  Dry dressing was then applied. The right arm was placed in a sling immobilizer.  The patient was awakened, extubated, and taken into the recovery room in stable condition.     Vania Rea. Tangi Shroff, M.D.    KMS/MEDQ  D:  01/14/2011  T:  01/15/2011  Job:  [id number]  Electronically Signed by Francena Hanly M.D. on 02/06/2011 10:33:35 AM

## 2011-03-01 ENCOUNTER — Other Ambulatory Visit: Payer: Self-pay | Admitting: *Deleted

## 2011-03-08 ENCOUNTER — Ambulatory Visit: Payer: Self-pay

## 2011-03-16 ENCOUNTER — Ambulatory Visit (INDEPENDENT_AMBULATORY_CARE_PROVIDER_SITE_OTHER): Payer: 59 | Admitting: *Deleted

## 2011-03-16 DIAGNOSIS — E7800 Pure hypercholesterolemia, unspecified: Secondary | ICD-10-CM

## 2011-03-16 LAB — LIPID PANEL
LDL Cholesterol: 51 mg/dL (ref 0–99)
Total CHOL/HDL Ratio: 3

## 2011-03-16 LAB — HEPATIC FUNCTION PANEL
ALT: 17 U/L (ref 0–53)
AST: 21 U/L (ref 0–37)
Bilirubin, Direct: 0 mg/dL (ref 0.0–0.3)
Total Bilirubin: 0.5 mg/dL (ref 0.3–1.2)

## 2011-03-19 ENCOUNTER — Encounter: Payer: Self-pay | Admitting: *Deleted

## 2011-03-25 ENCOUNTER — Ambulatory Visit (INDEPENDENT_AMBULATORY_CARE_PROVIDER_SITE_OTHER): Payer: 59

## 2011-03-25 VITALS — BP 118/78 | Wt 186.0 lb

## 2011-03-25 DIAGNOSIS — E7800 Pure hypercholesterolemia, unspecified: Secondary | ICD-10-CM

## 2011-03-25 NOTE — Patient Instructions (Signed)
1)  Continue taking Lipitor and Niaspan as prescribed 2)  Continue making healthy dietary choices 3)  Resume exercise as tolerated according to shoulder surgery recovery 4)  Lipid clinic will release you to Dr. Azucena Cecil for further follow-up given well controlled lipids  Blood Pressure today:  118/78

## 2011-03-25 NOTE — Progress Notes (Signed)
[name] is a 75 y/o male presenting today for 6 month dyslipidemia follow-up.  Current lipid regimen includes Lipitor 880 mg daily and Niaspan 1000 mg daily (pretreated with ASA 81 mg).  Patient denies any medication issues including flushing and muscle aches/pains.  He is compliant with medications.  Dietary review reveals that patient is eating a heart healthy diet and attempting to choose low-fat options.  He does admit to areas of diet that could use improvement.  We have discussed how making even small changes to diet can add up over time.   In the past, patient has done a great job with maintaining a regular exercise routine, including riding the stationary bike for 30 minutes 3-5 times per week.  He recently had shoulder surgery and was unable to exercise for a couple of months after surgery and while recovering.  He has recently resumed mild exercise and continues to attend therapy.  He plans to resume normal exercise once fully recovered.   Patient does not smoke or drink alcohol.

## 2011-03-25 NOTE — Assessment & Plan Note (Signed)
Lipids are as follows:  TC 118 (goal <200), TG 101 (goal <150), HDL 46.9 (goal >40), LDL 51 (goal<70).  Patient has been at goal for lipid panel for the previous ~3 years and has been followed in the lipid clinic for quite some time.  He is having no medication issues and is maintaining a healthy diet and exercise regimen.  At this time, it is appropriate for patient to be released back to PCP, Dr. Tally Joe, for continued follow-up.    Goals: 1)  Continue current medications 2)  Continue heart healthy diet 3)  Resume exercise as shoulder tolerates 4)  Lipid clinic will notify Dr. Azucena Cecil of plans for continued follow-up

## 2011-07-13 ENCOUNTER — Encounter: Payer: Self-pay | Admitting: Cardiology

## 2011-07-13 ENCOUNTER — Ambulatory Visit (INDEPENDENT_AMBULATORY_CARE_PROVIDER_SITE_OTHER): Payer: 59 | Admitting: Cardiology

## 2011-07-13 DIAGNOSIS — E663 Overweight: Secondary | ICD-10-CM | POA: Insufficient documentation

## 2011-07-13 DIAGNOSIS — E7800 Pure hypercholesterolemia, unspecified: Secondary | ICD-10-CM

## 2011-07-13 DIAGNOSIS — I2510 Atherosclerotic heart disease of native coronary artery without angina pectoris: Secondary | ICD-10-CM

## 2011-07-13 NOTE — Patient Instructions (Signed)
Your physician has requested that you have an exercise tolerance test. For further information please visit [email or web address] Please also follow instruction sheet, as given.  Please call when you are ready to schedule.  The current medical regimen is effective;  continue present plan and medications.

## 2011-07-13 NOTE — Progress Notes (Signed)
HPI The patient presents for one-year followup. Since I last saw her she has done well.  The patient denies any new symptoms such as chest discomfort, neck or arm discomfort. There has been no new shortness of breath, PND or orthopnea. There have been no reported palpitations, presyncope or syncope.  He has been somewhat limited by shoulder pain with a recent right shoulder surgery and he also has chronic left hip pain and he needs hip injections.    No Known Allergies  Current Outpatient Prescriptions  Medication Sig Dispense Refill  . aspirin 81 MG EC tablet Take 81 mg by mouth daily.        Marland Kitchen atorvastatin (LIPITOR) 80 MG tablet Take 1 tablet (80 mg total) by mouth daily.  30 tablet  11  . esomeprazole (NEXIUM) 40 MG capsule Take 40 mg by mouth daily before breakfast.        . folic acid (FOLVITE) 400 MCG tablet Take 400 mcg by mouth daily.        . meloxicam (MOBIC) 15 MG tablet Take 15 mg by mouth daily.        . metoprolol tartrate (LOPRESSOR) 25 MG tablet Take 1 tablet (25 mg total) by mouth 2 (two) times daily.  60 tablet  11  . Multiple Vitamins-Minerals (CENTRUM SILVER PO) Take by mouth.        . niacin (NIASPAN) 1000 MG CR tablet Take 1 tablet (1,000 mg total) by mouth at bedtime.  100 tablet  3  . nitroGLYCERIN (NITROSTAT) 0.4 MG SL tablet Place 0.4 mg under the tongue every 5 (five) minutes as needed.          Past Medical History  Diagnosis Date  . CAD (coronary artery disease)     previous myocardial infarction in 2001 with 90% circumflex lesion treated with PTCA. The LAD had 70-80% stenosis which was treawted with angioplasty. Most recent catheterization in 2003 demonstrated the LAD at 30% mid stenosis, circumflex 60-70% stenosis in the mid AV grove, and 40% stenosis in the previously stented area. The right coronary artery had 30-40% stenosis in the mid portion.   Marland Kitchen CAD (coronary artery disease)     The EF was 55%. He had PTCA and stenting in the AV circumflex. His last  stress perfustion study was in 2006, previous syncope with a negative electrophysiology workup, gastroesopageal flux disease, dyslipidemia (fllowed in the Lipid Clinic), arthritis.    Past Surgical History  Procedure Date  . Appendectomy   . Arthroscopic knee surgery     x2  . Shoulder surgery    ROS:  As stated in the HPI and negative for all other systems.  PHYSICAL EXAM BP 138/70  Pulse 70  Ht 5\' 9"  (1.753 m)  Wt 191 lb (86.637 kg)  BMI 28.21 kg/m2 GENERAL:  Well appearing HEENT:  Pupils equal round and reactive, fundi not visualized, oral mucosa unremarkable NECK:  No jugular venous distention, waveform within normal limits, carotid upstroke brisk and symmetric, no bruits, no thyromegaly LYMPHATICS:  No cervical, inguinal adenopathy LUNGS:  Clear to auscultation bilaterally BACK:  No CVA tenderness CHEST:  Unremarkable HEART:  PMI not displaced or sustained,S1 and S2 within normal limits, no S3, no S4, no clicks, no rubs, no murmurs ABD:  Flat, positive bowel sounds normal in frequency in pitch, no bruits, no rebound, no guarding, no midline pulsatile mass, no hepatomegaly, no splenomegaly EXT:  2 plus pulses throughout, no edema, no cyanosis no clubbing SKIN:  No  rashes no nodules NEURO:  Cranial nerves II through XII grossly intact, motor grossly intact throughout PSYCH:  Cognitively intact, oriented to person place and time  EKG:  Sinus rhythm, rate 70, axis within normal limits, intervals within normal limits, no acute ST-T wave changes.  ASSESSMENT AND PLAN

## 2011-07-13 NOTE — Assessment & Plan Note (Signed)
He was followed in our lipid clinic. He is now followed by his primary provider. I will suggest a goal LDL less than 100 and HDL greater than 40. He will remain on the meds as listed.

## 2011-07-13 NOTE — Assessment & Plan Note (Signed)
His weight is up slightly.  We discussed this.  He will watch his diet and I will give him a prescription for exercise at the time of the stress test.

## 2011-07-13 NOTE — Assessment & Plan Note (Signed)
At this point she's having no new symptoms. However, it has been almost 5 years since his last stress test. When his hip has been injected and he's feeling better I will bring him back for an exercise treadmill test for screening purposes.

## 2011-11-02 ENCOUNTER — Telehealth: Payer: Self-pay | Admitting: Cardiology

## 2011-11-02 DIAGNOSIS — I2510 Atherosclerotic heart disease of native coronary artery without angina pectoris: Secondary | ICD-10-CM

## 2011-11-02 NOTE — Telephone Encounter (Signed)
New Msg: Pt wife calling wanting to speak with nurse/MD regarding pt scheduling treadmill per MD request. Please return pt wife call to discuss further.

## 2011-11-02 NOTE — Telephone Encounter (Signed)
Per Dr Hochrein's 12/12 note pt is to have a GXT once his hip is feeling better.  Pt states it does feel better and that he can do his GXT now. Will place order and have the pt scheduled

## 2011-11-03 ENCOUNTER — Other Ambulatory Visit: Payer: Self-pay | Admitting: *Deleted

## 2011-11-04 ENCOUNTER — Telehealth: Payer: Self-pay | Admitting: Cardiology

## 2011-11-04 MED ORDER — NIACIN ER (ANTIHYPERLIPIDEMIC) 1000 MG PO TBCR: 1000.0000 mg | EXTENDED_RELEASE_TABLET | Freq: Every day | ORAL | Status: DC

## 2011-11-04 NOTE — Telephone Encounter (Signed)
PT'S WIFE CALLING [facility] [address] [facility] [place], PLS CALL [phone], PT OUT AND NEEDS ASAP

## 2011-12-03 ENCOUNTER — Ambulatory Visit (INDEPENDENT_AMBULATORY_CARE_PROVIDER_SITE_OTHER): Payer: 59 | Admitting: Cardiology

## 2011-12-03 ENCOUNTER — Encounter: Payer: Self-pay | Admitting: Cardiology

## 2011-12-03 DIAGNOSIS — I2510 Atherosclerotic heart disease of native coronary artery without angina pectoris: Secondary | ICD-10-CM

## 2011-12-03 NOTE — Procedures (Signed)
Exercise Treadmill Test  Pre-Exercise Testing Evaluation Rhythm: normal sinus  Rate: 66   PR:  .13 QRS:  .08  QT:  .41 QTc: .43     Test  Exercise Tolerance Test Ordering MD: Angelina Sheriff, MD  Interpreting MD: Angelina Sheriff, MD  Unique Test No: 1  Treadmill:  1  Indication for ETT: known ASHD  Contraindication to ETT: No   Stress Modality: exercise - treadmill  Cardiac Imaging Performed: non   Protocol: standard Bruce - maximal  Max BP:  159/51  Max MPHR (bpm):  147 85% MPR (bpm):  124  MPHR obtained (bpm):  133 % MPHR obtained:  91  Reached 85% MPHR (min:sec):  6:33 Total Exercise Time (min-sec):  8:00  Workload in METS:  10.1 Borg Scale: 15  Reason ETT Terminated:  desired heart rate attained    ST Segment Analysis At Rest: normal ST segments - no evidence of significant ST depression With Exercise: no evidence of significant ST depression  Other Information Arrhythmia:  No Angina during ETT:  absent (0) Quality of ETT:  diagnostic  ETT Interpretation:  normal - no evidence of ischemia by ST analysis  Comments: The patient had an moderate exercise tolerance.  There was no chest pain.  There was an appropriate level of dyspnea.  There were no arrhythmias, a normal heart rate response and normal BP response.  There were no ischemic ST T wave changes and a normal heart rate recovery.  Recommendations: Negative adequate ETT.  No further testing is indicated.  Based on the above I gave the patient a prescription for exercise.

## 2012-01-03 ENCOUNTER — Encounter: Payer: Self-pay | Admitting: Cardiology

## 2012-09-07 ENCOUNTER — Telehealth: Payer: Self-pay | Admitting: Cardiology

## 2012-09-07 NOTE — Telephone Encounter (Signed)
Paper request given to Dr Antoine Poche.

## 2012-09-07 NOTE — Telephone Encounter (Signed)
Pt's wife calling to get surgical clearance for back surgery, dr Arbutus [facility] [phone]  pls call pt's wife [phone] ok to leave message

## 2012-09-08 NOTE — Telephone Encounter (Signed)
Pt scheduled to be seen 3:15 pm Monday for surgical clearance.

## 2012-09-11 ENCOUNTER — Encounter: Payer: Self-pay | Admitting: Cardiology

## 2012-09-11 ENCOUNTER — Ambulatory Visit (INDEPENDENT_AMBULATORY_CARE_PROVIDER_SITE_OTHER): Payer: Medicare Other | Admitting: Cardiology

## 2012-09-11 VITALS — BP 140/75 | HR 66 | Ht 69.0 in | Wt 185.8 lb

## 2012-09-11 DIAGNOSIS — I2510 Atherosclerotic heart disease of native coronary artery without angina pectoris: Secondary | ICD-10-CM

## 2012-09-11 DIAGNOSIS — E7800 Pure hypercholesterolemia, unspecified: Secondary | ICD-10-CM

## 2012-09-11 DIAGNOSIS — E663 Overweight: Secondary | ICD-10-CM

## 2012-09-11 MED ORDER — METOPROLOL TARTRATE 25 MG PO TABS: 25.0000 mg | ORAL_TABLET | Freq: Two times a day (BID) | ORAL | Status: DC

## 2012-09-11 MED ORDER — NIACIN ER (ANTIHYPERLIPIDEMIC) 1000 MG PO TBCR: 1000.0000 mg | EXTENDED_RELEASE_TABLET | Freq: Every day | ORAL | Status: DC

## 2012-09-11 MED ORDER — ATORVASTATIN CALCIUM 80 MG PO TABS: 80.0000 mg | ORAL_TABLET | Freq: Every day | ORAL | Status: DC

## 2012-09-11 MED ORDER — NITROGLYCERIN 0.4 MG SL SUBL: 0.4000 mg | SUBLINGUAL_TABLET | SUBLINGUAL | Status: DC | PRN

## 2012-09-11 NOTE — Progress Notes (Signed)
HPI The patient presents for one-year followup. Since I last saw her he has done well from a cardiovascular standpoint.  The patient denies any new symptoms such as chest discomfort, neck or arm discomfort. There has been no new shortness of breath, PND or orthopnea. There have been no reported palpitations, presyncope or syncope.  Unfortunately he has developed leg pain and back pain related to spinal stenosis. He is to have this treated. He presents for preoperative clearance. He still a little walk about 10 minutes at a time before he develops leg pain.  No Known Allergies  Current Outpatient Prescriptions  Medication Sig Dispense Refill  . aspirin 81 MG EC tablet Take 81 mg by mouth daily.        Marland Kitchen atorvastatin (LIPITOR) 80 MG tablet Take 80 mg by mouth daily.      Marland Kitchen esomeprazole (NEXIUM) 40 MG capsule Take 40 mg by mouth daily before breakfast.        . folic acid (FOLVITE) 400 MCG tablet Take 400 mcg by mouth daily.        . metoprolol tartrate (LOPRESSOR) 25 MG tablet Take 25 mg by mouth 2 (two) times daily.      . Multiple Vitamins-Minerals (CENTRUM SILVER PO) Take by mouth.        . niacin (NIASPAN) 1000 MG CR tablet Take 1,000 mg by mouth at bedtime.      . nitroGLYCERIN (NITROSTAT) 0.4 MG SL tablet Place 0.4 mg under the tongue every 5 (five) minutes as needed.        Marland Kitchen atorvastatin (LIPITOR) 80 MG tablet Take 1 tablet (80 mg total) by mouth daily.  30 tablet  11  . metoprolol tartrate (LOPRESSOR) 25 MG tablet Take 1 tablet (25 mg total) by mouth 2 (two) times daily.  60 tablet  11   No current facility-administered medications for this visit.    Past Medical History  Diagnosis Date  . CAD (coronary artery disease)     previous myocardial infarction in 2001 with 90% circumflex lesion treated with PTCA. The LAD had 70-80% stenosis which was treawted with angioplasty. Most recent catheterization in 2003 demonstrated the LAD at 30% mid stenosis, circumflex 60-70% stenosis in the  mid AV grove, and 40% stenosis in the previously stented area. The right coronary artery had 30-40% stenosis in the mid portion.   Marland Kitchen CAD (coronary artery disease)     The EF was 55%. He had PTCA and stenting in the AV circumflex. His last stress perfustion study was in 2006, previous syncope with a negative electrophysiology workup, gastroesopageal flux disease, dyslipidemia (fllowed [facility]), arthritis.    Past Surgical History  Procedure Laterality Date  . Appendectomy    . Arthroscopic knee surgery      x2  . Shoulder surgery     ROS:  As stated in the HPI and negative for all other systems.  PHYSICAL EXAM BP 140/75  Pulse 66  Ht 5\' 9"  (1.753 m)  Wt 185 lb 12.8 oz (84.278 kg)  BMI 27.43 kg/m2 GENERAL:  Well appearing HEENT:  Pupils equal round and reactive, fundi not visualized, oral mucosa unremarkable NECK:  No jugular venous distention, waveform within normal limits, carotid upstroke brisk and symmetric, no bruits, no thyromegaly LYMPHATICS:  No cervical, inguinal adenopathy LUNGS:  Clear to auscultation bilaterally BACK:  No CVA tenderness CHEST:  Unremarkable HEART:  PMI not displaced or sustained,S1 and S2 within normal limits, no S3, no S4, no  clicks, no rubs, no murmurs ABD:  Flat, positive bowel sounds normal in frequency in pitch, no bruits, no rebound, no guarding, no midline pulsatile mass, no hepatomegaly, no splenomegaly EXT:  2 plus pulses throughout, no edema, no cyanosis no clubbing SKIN:  No rashes no nodules NEURO:  Cranial nerves II through XII grossly intact, motor grossly intact throughout PSYCH:  Cognitively intact, oriented to person place and time  EKG:  Sinus rhythm, rate 76, axis within normal limits, intervals within normal limits, no acute ST-T wave changes.  ASSESSMENT AND PLAN  PRE OP The patient has had no new symptoms since his stress test in May of last year. He has a relatively reasonable functional level (greater than 4 METS).   He is going for a procedure that is at best moderate risk from a cardiovascular standpoint. Therefore, based on ACC/AHA guidelines, the patient would be at acceptable risk for the planned procedure without further cardiovascular testing.  CAD He has no new symptoms. He will continue with risk reduction.  HYPERTENSION The blood pressure is at target. No change in medications is indicated. We will continue with therapeutic lifestyle changes (TLC).

## 2012-09-11 NOTE — Patient Instructions (Addendum)
The current medical regimen is effective;  continue present plan and medications.  You have been cleared for surgery.  Follow up in 1 year with Dr Hochrein.  You will receive a letter in the mail 2 months before you are due.  Please call us when you receive this letter to schedule your follow up appointment.  

## 2012-09-19 DIAGNOSIS — M48061 Spinal stenosis, lumbar region without neurogenic claudication: Secondary | ICD-10-CM | POA: Diagnosis present

## 2012-09-19 DIAGNOSIS — M4310 Spondylolisthesis, site unspecified: Secondary | ICD-10-CM | POA: Insufficient documentation

## 2012-09-19 NOTE — H&P (Signed)
History of Present Illness The patient is a 76 year old male who presents with back pain. The patient is here today in referral from Dr. Ethelene Hal . The patient reports low back symptoms including pain which began 5 year(s) (+) ago without any known injury. and Symptoms include pain (lower lumbar radiating into the left lower ext., posteriorly to the level of the calf at times, especially with walking for periods of time. At times he will have some right lower ext. pain.) and numbness (left lower ext. with bilat.feet/toes ), while symptoms do not include weakness, incontinence of stool or incontinence of urine. Current treatment includes non-opioid analgesics (Tylenol ), opioid analgesics (Percocet PRN ) and activity modification. Past evaluation has included MRI of the lumbar spine (07/2012 at [facility] ) and pain management evaluation. Past treatment has included epidural injections (x4 since 2011, with the last injection in 12/2011).  Subjective Transcription  [name] is a very pleasant, active, young man who has been having kind of a constant deterioration in his quality of life over the last several years. He was diagnosed with degenerative spondylolisthesis at L4-5 with spinal stenosis L3-4, L4-5. He has tried exercising, physical therapy, and he has had injection therapy with my partner Dr. Ethelene Hal. Despite these modalities his overall quality of life has continued to deteriorate. He states that he can no longer play golf as much as he wants to. 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 He has difficulty walking in a neutral position.  Allergies No Known Drug Allergies.    Family History Congestive Heart Failure. mother and father Heart Disease. father Rheumatoid Arthritis. mother   Social History Alcohol use. never consumed alcohol Children. 2 Current work status. retired Financial planner (Currently). no Exercise. Exercises weekly; does running / walking Illicit drug  use. no Living situation. live with spouse Marital status. married Number of flights of stairs before winded. 4-5 Pain Contract. no Previously in rehab. no Tobacco use. Never smoker. never smoker   Medication History Percocet (5-325MG  Tablet, 1 (one) Tablet Oral four times daily, as needed, Taken starting 06/22/2012) Active. Atorvastatin Calcium (80MG  Tablet, Oral) Active. Metoprolol Tartrate (25MG  Tablet, Oral) Active. NexIUM (40MG  Capsule DR, Oral) Active. Folic Acid ( Oral) Specific dose unknown - Active. Baby Aspirin (81MG  Tablet Chewable, Oral) Active. Niaspan ( Oral) Specific dose unknown - Active.   Past Surgical History Appendectomy Arthroscopy of Knee. bilateral Heart Stents Other Surgery. TOTAL RIGHT SHOULDER REPLACEMENT Tonsillectomy   Vitals Weight: 185 lb Height: 69 in Body Surface Area: 2.02 m Body Mass Index: 27.32 kg/m Pulse: 75 (Regular) BP: 138/84 (Sitting, Left Arm, Standard)  Objective Transcription  IMAGES:  The patient's MRI was done July 03, 2012. That MRI of the lumbar spine demonstrates a right sided foraminal and lateral recessed stenosis due to hard disk osteophyte bone spur formation. He has severe spinal stenosis at L4-5 and severe bilateral neuroforaminal narrowing at L4-5 due to the spondylolisthesis and facet arthrosis. There is no significant abnormality at L5-S1. L2-3 there is mild lateral recessed stenosis. T10-T11 there is mild central stenosis but no cord deformity.  PHYSICAL EXAMINATION:  The patient is a pleasant gentleman who appears younger than his stated age. He is alert and oriented times three.  Respiratory, no shortness of breath or chest pain.  Abdomen, soft and nontender.  He denies a history of incontinence of bowel and bladder.  On examination, negative Babinski test. Two plus symmetrical deep tendon reflexes in the lower extremities. No clonus. Two plus dorsal  pedal pulses, posterior  tibial pulses. Compartments are soft and nontender. No significant hip, knee or ankle pain with joint range of motion. Significant pain when he extends the spine, relief with forward flexion. No obvious skin lesions, abrasions, contusions of the lumbar spine.    Assessment & Plan( Spinal Stenosis, Lumbar (724.02)  Lumbar disc degeneration   Assessments Transcription  Degenerative spondylolisthesis, L4-5. Degenerative spinal stenosis L3-4, L4-5. At this point in time, we had a long discussion about surgical intervention. Because of the instability and the stenosis more than likely the patient would require a L3-4 right sided decompression, L4-5 complete decompression with instrumented fusion or an in situ arthrodesis depending upon his bone quality. Because of the existing instability (spondylolisthesis) I would be very hesitant to recommend just doing a decompression without some sort of fusion to prevent iatrogenic worsening of the slip.  Plans Transcription  I have gone over this with the patient and his wife and they have expressed an understanding of the surgery. We also touched upon the risks which include infection, bleeding, nerve damage, death, stroke, paralysis, loss of bowel or bladder control, ongoing or worse pain, adjacent segment disease, need for further surgery, nonunion. All of their questions were encouraged and addressed. I have given them some literature to review. They will contact me and let me know how they would like to proceed.  Leiliana Foody D. Shon Baton, MD  Clearance obtained form PCP Dr.'s Tiburcio Pea and [facility]

## 2012-09-20 ENCOUNTER — Encounter (HOSPITAL_COMMUNITY): Payer: Self-pay | Admitting: Pharmacy Technician

## 2012-09-26 ENCOUNTER — Encounter (HOSPITAL_COMMUNITY)
Admission: RE | Admit: 2012-09-26 | Discharge: 2012-09-26 | Disposition: A | Discharge status: Home / Self Care | Payer: Medicare Other | Source: Ambulatory Visit | Attending: Orthopedic Surgery | Admitting: Orthopedic Surgery

## 2012-09-26 ENCOUNTER — Encounter (HOSPITAL_COMMUNITY)
Admission: RE | Admit: 2012-09-26 | Discharge: 2012-09-26 | Disposition: A | Discharge status: Home / Self Care | Payer: Medicare Other | Source: Ambulatory Visit | Attending: Anesthesiology | Admitting: Anesthesiology

## 2012-09-26 ENCOUNTER — Encounter (HOSPITAL_COMMUNITY): Payer: Self-pay

## 2012-09-26 HISTORY — DX: Unspecified osteoarthritis, unspecified site: M19.90

## 2012-09-26 HISTORY — DX: Essential (primary) hypertension: I10

## 2012-09-26 HISTORY — DX: Gastro-esophageal reflux disease without esophagitis: K21.9

## 2012-09-26 LAB — SURGICAL PCR SCREEN
MRSA, PCR: NEGATIVE
Staphylococcus aureus: NEGATIVE

## 2012-09-26 LAB — TYPE AND SCREEN: Antibody Screen: NEGATIVE

## 2012-09-26 LAB — BASIC METABOLIC PANEL
Calcium: 9.9 mg/dL (ref 8.4–10.5)
Chloride: 107 mEq/L (ref 96–112)
Creatinine, Ser: 1.37 mg/dL — ABNORMAL HIGH (ref 0.50–1.35)
GFR calc Af Amer: 57 mL/min — ABNORMAL LOW (ref >90)
Sodium: 142 mEq/L (ref 135–145)

## 2012-09-26 LAB — CBC
Platelets: 165 10*3/uL (ref 150–400)
RDW: 14.3 % (ref 11.5–15.5)
WBC: 7 10*3/uL (ref 4.0–10.5)

## 2012-09-26 NOTE — Pre-Procedure Instructions (Signed)
Felder L Market  09/26/2012   Your procedure is scheduled on: 09/28/12  Report to [facility] at 930 AM.  Call this number if you have problems the morning of surgery: [phone]   Remember:   Do not eat food or drink liquids after midnight.   Take these medicines the morning of surgery with A SIP OF WATER:nexium, metoprolol STOP aspirin, nsaids ,herbal meds, blood thinners   Do not wear jewelry, make-up or nail polish.  Do not wear lotions, powders, or perfumes. You may wear deodorant.  Do not shave 48 hours prior to surgery. Men may shave face and neck.  Do not bring valuables to the hospital.  Contacts, dentures or bridgework may not be worn into surgery.  Leave suitcase in the car. After surgery it may be brought to your room.  For patients admitted to the hospital, checkout time is 11:00 AM the day of  discharge.   Patients discharged the day of surgery**  Special Instructions: Shower using CHG 2 nights before surgery and the night before surgery.  If you shower the day of surgery use CHG.  Use special wash - you have one bottle of CHG for all showers.  You should use approximately 1/3 of the bottle for each shower.   Please read over the following fact sheets that you were given: Pain Booklet, Coughing and Deep Breathing, Blood Transfusion Information, MRSA Information and Surgical Site Infection Prevention  

## 2012-09-26 NOTE — Pre-Procedure Instructions (Signed)
LADARION MUNYON  09/26/2012   Your procedure is scheduled on: 09/28/12  Report to [facility] at 930 AM.  Call this number if you have problems the morning of surgery: [phone]   Remember:   Do not eat food or drink liquids after midnight.   Take these medicines the morning of surgery with A SIP OF WATER:nexium, metoprolol STOP aspirin, nsaids ,herbal meds, blood thinners   Do not wear jewelry, make-up or nail polish.  Do not wear lotions, powders, or perfumes. You may wear deodorant.  Do not shave 48 hours prior to surgery. Men may shave face and neck.  Do not bring valuables to the hospital.  Contacts, dentures or bridgework may not be worn into surgery.  Leave suitcase in the car. After surgery it may be brought to your room.  For patients admitted to the hospital, checkout time is 11:00 AM the day of  discharge.   Patients discharged the day of surgery**  Special Instructions: Shower using CHG 2 nights before surgery and the night before surgery.  If you shower the day of surgery use CHG.  Use special wash - you have one bottle of CHG for all showers.  You should use approximately 1/3 of the bottle for each shower.   Please read over the following fact sheets that you were given: Pain Booklet, Coughing and Deep Breathing, Blood Transfusion Information, MRSA Information and Surgical Site Infection Prevention

## 2012-09-26 NOTE — Progress Notes (Signed)
09/26/12 1335  OBSTRUCTIVE SLEEP APNEA  Score 4 or greater  Results sent to PCP

## 2012-09-27 MED ORDER — DEXAMETHASONE SODIUM PHOSPHATE 4 MG/ML IJ SOLN
4.0000 mg | Freq: Once | INTRAMUSCULAR | Status: AC
  Administered 2012-09-28: 12 mg via INTRAVENOUS
  Filled 2012-09-27: qty 1

## 2012-09-27 MED ORDER — CEFAZOLIN SODIUM-DEXTROSE 2-3 GM-% IV SOLR
2.0000 g | INTRAVENOUS | Status: AC
  Administered 2012-09-28: 2 g via INTRAVENOUS
  Filled 2012-09-27: qty 50

## 2012-09-27 MED ORDER — ACETAMINOPHEN 10 MG/ML IV SOLN
1000.0000 mg | Freq: Once | INTRAVENOUS | Status: AC
  Administered 2012-09-28: 1000 mg via INTRAVENOUS
  Filled 2012-09-27: qty 100

## 2012-09-28 ENCOUNTER — Inpatient Hospital Stay (HOSPITAL_COMMUNITY): Payer: Medicare Other

## 2012-09-28 ENCOUNTER — Inpatient Hospital Stay (HOSPITAL_COMMUNITY)
Admission: RE | Admit: 2012-09-28 | Discharge: 2012-09-30 | DRG: 460 | Disposition: A | Discharge status: Home / Self Care | Payer: Medicare Other | Source: Ambulatory Visit | Attending: Orthopedic Surgery | Admitting: Orthopedic Surgery

## 2012-09-28 ENCOUNTER — Encounter (HOSPITAL_COMMUNITY)
Admission: RE | Disposition: A | Discharge status: Home / Self Care | Payer: Self-pay | Source: Ambulatory Visit | Attending: Orthopedic Surgery

## 2012-09-28 ENCOUNTER — Encounter (HOSPITAL_COMMUNITY): Payer: Self-pay | Admitting: *Deleted

## 2012-09-28 ENCOUNTER — Encounter (HOSPITAL_COMMUNITY): Payer: Self-pay | Admitting: Anesthesiology

## 2012-09-28 ENCOUNTER — Inpatient Hospital Stay (HOSPITAL_COMMUNITY): Payer: Medicare Other | Admitting: Anesthesiology

## 2012-09-28 DIAGNOSIS — M4310 Spondylolisthesis, site unspecified: Principal | ICD-10-CM | POA: Diagnosis present

## 2012-09-28 DIAGNOSIS — I2510 Atherosclerotic heart disease of native coronary artery without angina pectoris: Secondary | ICD-10-CM | POA: Diagnosis present

## 2012-09-28 DIAGNOSIS — Z79899 Other long term (current) drug therapy: Secondary | ICD-10-CM

## 2012-09-28 DIAGNOSIS — I10 Essential (primary) hypertension: Secondary | ICD-10-CM | POA: Diagnosis present

## 2012-09-28 DIAGNOSIS — K219 Gastro-esophageal reflux disease without esophagitis: Secondary | ICD-10-CM | POA: Diagnosis present

## 2012-09-28 DIAGNOSIS — Z9861 Coronary angioplasty status: Secondary | ICD-10-CM

## 2012-09-28 DIAGNOSIS — M48061 Spinal stenosis, lumbar region without neurogenic claudication: Secondary | ICD-10-CM | POA: Diagnosis present

## 2012-09-28 DIAGNOSIS — M51379 Other intervertebral disc degeneration, lumbosacral region without mention of lumbar back pain or lower extremity pain: Secondary | ICD-10-CM | POA: Diagnosis present

## 2012-09-28 DIAGNOSIS — Z7982 Long term (current) use of aspirin: Secondary | ICD-10-CM

## 2012-09-28 DIAGNOSIS — M5137 Other intervertebral disc degeneration, lumbosacral region: Secondary | ICD-10-CM | POA: Diagnosis present

## 2012-09-28 HISTORY — PX: LUMBAR LAMINECTOMY/DECOMPRESSION MICRODISCECTOMY: SHX5026

## 2012-09-28 SURGERY — LUMBAR LAMINECTOMY/DECOMPRESSION MICRODISCECTOMY 2 LEVELS
Anesthesia: General | Site: Back | Wound class: Clean

## 2012-09-28 MED ORDER — SODIUM CHLORIDE 0.9 % IV SOLN
10.0000 mg | INTRAVENOUS | Status: DC | PRN
  Administered 2012-09-28: 25 ug/min via INTRAVENOUS

## 2012-09-28 MED ORDER — FENTANYL CITRATE 0.05 MG/ML IJ SOLN
INTRAMUSCULAR | Status: DC | PRN
  Administered 2012-09-28: 50 ug via INTRAVENOUS
  Administered 2012-09-28: 100 ug via INTRAVENOUS
  Administered 2012-09-28: 50 ug via INTRAVENOUS
  Administered 2012-09-28 (×3): 100 ug via INTRAVENOUS

## 2012-09-28 MED ORDER — CEFAZOLIN SODIUM 1-5 GM-% IV SOLN
1.0000 g | Freq: Three times a day (TID) | INTRAVENOUS | Status: AC
  Administered 2012-09-28 – 2012-09-29 (×2): 1 g via INTRAVENOUS
  Filled 2012-09-28 (×2): qty 50

## 2012-09-28 MED ORDER — HYDROMORPHONE HCL PF 1 MG/ML IJ SOLN
INTRAMUSCULAR | Status: AC
  Filled 2012-09-28: qty 1

## 2012-09-28 MED ORDER — VANCOMYCIN HCL 500 MG IV SOLR
INTRAVENOUS | Status: DC | PRN
  Administered 2012-09-28: 500 mg

## 2012-09-28 MED ORDER — GLYCOPYRROLATE 0.2 MG/ML IJ SOLN
INTRAMUSCULAR | Status: DC | PRN
  Administered 2012-09-28: 0.6 mg via INTRAVENOUS

## 2012-09-28 MED ORDER — MIDAZOLAM HCL 2 MG/2ML IJ SOLN: 1.0000 mg | INTRAMUSCULAR | Status: DC | PRN

## 2012-09-28 MED ORDER — BUPIVACAINE-EPINEPHRINE PF 0.25-1:200000 % IJ SOLN
INTRAMUSCULAR | Status: DC | PRN
  Administered 2012-09-28: 10 mL

## 2012-09-28 MED ORDER — PANTOPRAZOLE SODIUM 40 MG PO TBEC
40.0000 mg | DELAYED_RELEASE_TABLET | Freq: Every day | ORAL | Status: DC
  Administered 2012-09-28 – 2012-09-30 (×3): 40 mg via ORAL
  Filled 2012-09-28 (×3): qty 1

## 2012-09-28 MED ORDER — MENTHOL 3 MG MT LOZG: 1.0000 | LOZENGE | OROMUCOSAL | Status: DC | PRN

## 2012-09-28 MED ORDER — LACTATED RINGERS IV SOLN
INTRAVENOUS | Status: DC
  Administered 2012-09-29: 03:00:00 via INTRAVENOUS

## 2012-09-28 MED ORDER — THROMBIN 20000 UNITS EX SOLR
CUTANEOUS | Status: AC
  Filled 2012-09-28: qty 20000

## 2012-09-28 MED ORDER — SUCCINYLCHOLINE CHLORIDE 20 MG/ML IJ SOLN
INTRAMUSCULAR | Status: DC | PRN
  Administered 2012-09-28: 120 mg via INTRAVENOUS

## 2012-09-28 MED ORDER — ACETAMINOPHEN 10 MG/ML IV SOLN
1000.0000 mg | Freq: Four times a day (QID) | INTRAVENOUS | Status: AC
  Administered 2012-09-28 – 2012-09-29 (×4): 1000 mg via INTRAVENOUS
  Filled 2012-09-28 (×4): qty 100

## 2012-09-28 MED ORDER — NITROGLYCERIN 0.4 MG SL SUBL: 0.4000 mg | SUBLINGUAL_TABLET | SUBLINGUAL | Status: DC | PRN

## 2012-09-28 MED ORDER — HEMOSTATIC AGENTS (NO CHARGE) OPTIME
TOPICAL | Status: DC | PRN
  Administered 2012-09-28 (×3): 1 via TOPICAL

## 2012-09-28 MED ORDER — METOPROLOL TARTRATE 25 MG PO TABS
25.0000 mg | ORAL_TABLET | Freq: Two times a day (BID) | ORAL | Status: DC
  Administered 2012-09-28 – 2012-09-30 (×4): 25 mg via ORAL
  Filled 2012-09-28 (×5): qty 1

## 2012-09-28 MED ORDER — MIDAZOLAM HCL 5 MG/5ML IJ SOLN
INTRAMUSCULAR | Status: DC | PRN
  Administered 2012-09-28: 2 mg via INTRAVENOUS

## 2012-09-28 MED ORDER — PHENOL 1.4 % MT LIQD: 1.0000 | OROMUCOSAL | Status: DC | PRN

## 2012-09-28 MED ORDER — LACTATED RINGERS IV SOLN
INTRAVENOUS | Status: DC | PRN
  Administered 2012-09-28 (×3): via INTRAVENOUS

## 2012-09-28 MED ORDER — METHOCARBAMOL 100 MG/ML IJ SOLN
500.0000 mg | Freq: Four times a day (QID) | INTRAVENOUS | Status: DC | PRN
  Filled 2012-09-28: qty 5

## 2012-09-28 MED ORDER — METHOCARBAMOL 500 MG PO TABS
500.0000 mg | ORAL_TABLET | Freq: Four times a day (QID) | ORAL | Status: DC | PRN
  Administered 2012-09-29 – 2012-09-30 (×3): 500 mg via ORAL
  Filled 2012-09-28 (×3): qty 1

## 2012-09-28 MED ORDER — DEXTROSE 5 % IV SOLN
INTRAVENOUS | Status: DC | PRN
  Administered 2012-09-28: 12:00:00 via INTRAVENOUS

## 2012-09-28 MED ORDER — VANCOMYCIN HCL 500 MG IV SOLR
INTRAVENOUS | Status: AC
  Filled 2012-09-28: qty 500

## 2012-09-28 MED ORDER — ACETAMINOPHEN 10 MG/ML IV SOLN
INTRAVENOUS | Status: AC
  Filled 2012-09-28: qty 100

## 2012-09-28 MED ORDER — OXYCODONE HCL 5 MG PO TABS
10.0000 mg | ORAL_TABLET | ORAL | Status: DC | PRN
  Administered 2012-09-29 – 2012-09-30 (×5): 10 mg via ORAL
  Filled 2012-09-28 (×5): qty 2

## 2012-09-28 MED ORDER — DEXAMETHASONE SODIUM PHOSPHATE 4 MG/ML IJ SOLN
INTRAMUSCULAR | Status: AC
  Filled 2012-09-28: qty 1

## 2012-09-28 MED ORDER — DEXAMETHASONE SODIUM PHOSPHATE 4 MG/ML IJ SOLN
4.0000 mg | Freq: Four times a day (QID) | INTRAMUSCULAR | Status: DC
  Administered 2012-09-28 – 2012-09-29 (×2): 4 mg via INTRAVENOUS
  Filled 2012-09-28 (×10): qty 1

## 2012-09-28 MED ORDER — PROPOFOL 10 MG/ML IV BOLUS
INTRAVENOUS | Status: DC | PRN
  Administered 2012-09-28: 140 mg via INTRAVENOUS

## 2012-09-28 MED ORDER — DEXAMETHASONE 4 MG PO TABS
4.0000 mg | ORAL_TABLET | Freq: Four times a day (QID) | ORAL | Status: DC
  Administered 2012-09-29 – 2012-09-30 (×6): 4 mg via ORAL
  Filled 2012-09-28 (×10): qty 1

## 2012-09-28 MED ORDER — LIDOCAINE HCL (CARDIAC) 20 MG/ML IV SOLN
INTRAVENOUS | Status: DC | PRN
  Administered 2012-09-28 (×2): 100 mg via INTRAVENOUS

## 2012-09-28 MED ORDER — SODIUM CHLORIDE 0.9 % IJ SOLN
3.0000 mL | Freq: Two times a day (BID) | INTRAMUSCULAR | Status: DC
  Administered 2012-09-29 – 2012-09-30 (×3): 3 mL via INTRAVENOUS

## 2012-09-28 MED ORDER — ONDANSETRON HCL 4 MG/2ML IJ SOLN
INTRAMUSCULAR | Status: DC | PRN
  Administered 2012-09-28 (×2): 4 mg via INTRAVENOUS

## 2012-09-28 MED ORDER — ONDANSETRON HCL 4 MG/2ML IJ SOLN: 4.0000 mg | INTRAMUSCULAR | Status: DC | PRN

## 2012-09-28 MED ORDER — OXYCODONE HCL 5 MG/5ML PO SOLN: 5.0000 mg | Freq: Once | ORAL | Status: DC | PRN

## 2012-09-28 MED ORDER — GELATIN ABSORBABLE MT POWD
OROMUCOSAL | Status: DC | PRN
  Administered 2012-09-28: 13:00:00 via TOPICAL

## 2012-09-28 MED ORDER — PROMETHAZINE HCL 25 MG/ML IJ SOLN: 6.2500 mg | INTRAMUSCULAR | Status: DC | PRN

## 2012-09-28 MED ORDER — ATORVASTATIN CALCIUM 80 MG PO TABS
80.0000 mg | ORAL_TABLET | Freq: Every day | ORAL | Status: DC
  Administered 2012-09-28 – 2012-09-30 (×3): 80 mg via ORAL
  Filled 2012-09-28 (×3): qty 1

## 2012-09-28 MED ORDER — NIACIN ER (ANTIHYPERLIPIDEMIC) 500 MG PO TBCR
1000.0000 mg | EXTENDED_RELEASE_TABLET | Freq: Every day | ORAL | Status: DC
  Administered 2012-09-28 – 2012-09-29 (×2): 1000 mg via ORAL
  Filled 2012-09-28 (×3): qty 2

## 2012-09-28 MED ORDER — ZOLPIDEM TARTRATE 5 MG PO TABS
5.0000 mg | ORAL_TABLET | Freq: Every evening | ORAL | Status: DC | PRN
  Administered 2012-09-29: 5 mg via ORAL
  Filled 2012-09-28: qty 1

## 2012-09-28 MED ORDER — SODIUM CHLORIDE 0.9 % IV SOLN: 250.0000 mL | INTRAVENOUS | Status: DC

## 2012-09-28 MED ORDER — SODIUM CHLORIDE 0.9 % IJ SOLN: 3.0000 mL | INTRAMUSCULAR | Status: DC | PRN

## 2012-09-28 MED ORDER — LACTATED RINGERS IV SOLN
INTRAVENOUS | Status: DC
  Administered 2012-09-28: 10:00:00 via INTRAVENOUS

## 2012-09-28 MED ORDER — ALBUMIN HUMAN 5 % IV SOLN
INTRAVENOUS | Status: DC | PRN
  Administered 2012-09-28: 13:00:00 via INTRAVENOUS

## 2012-09-28 MED ORDER — NEOSTIGMINE METHYLSULFATE 1 MG/ML IJ SOLN
INTRAMUSCULAR | Status: DC | PRN
  Administered 2012-09-28: 5 mg via INTRAVENOUS

## 2012-09-28 MED ORDER — OXYCODONE HCL 5 MG PO TABS: 5.0000 mg | ORAL_TABLET | Freq: Once | ORAL | Status: DC | PRN

## 2012-09-28 MED ORDER — VECURONIUM BROMIDE 10 MG IV SOLR
INTRAVENOUS | Status: DC | PRN
  Administered 2012-09-28: 3 mg via INTRAVENOUS
  Administered 2012-09-28: 5 mg via INTRAVENOUS

## 2012-09-28 MED ORDER — ARTIFICIAL TEARS OP OINT
TOPICAL_OINTMENT | OPHTHALMIC | Status: DC | PRN
  Administered 2012-09-28: 1 via OPHTHALMIC

## 2012-09-28 MED ORDER — MORPHINE SULFATE 2 MG/ML IJ SOLN
1.0000 mg | INTRAMUSCULAR | Status: DC | PRN
  Administered 2012-09-28 – 2012-09-29 (×2): 2 mg via INTRAVENOUS
  Filled 2012-09-28 (×2): qty 1

## 2012-09-28 MED ORDER — FENTANYL CITRATE 0.05 MG/ML IJ SOLN: 50.0000 ug | Freq: Once | INTRAMUSCULAR | Status: DC

## 2012-09-28 MED ORDER — HYDROMORPHONE HCL PF 1 MG/ML IJ SOLN
0.2500 mg | INTRAMUSCULAR | Status: DC | PRN
  Administered 2012-09-28 (×3): 0.5 mg via INTRAVENOUS

## 2012-09-28 SURGICAL SUPPLY — 60 items
BANDAGE GAUZE ELAST BULKY 4 IN (GAUZE/BANDAGES/DRESSINGS) ×2 IMPLANT
BUR EGG ELITE 4.0 (BURR) IMPLANT
CLOTH BEACON ORANGE TIMEOUT ST (SAFETY) ×2 IMPLANT
CLSR STERI-STRIP ANTIMIC 1/2X4 (GAUZE/BANDAGES/DRESSINGS) ×1 IMPLANT
CORDS BIPOLAR (ELECTRODE) ×2 IMPLANT
DRAPE C-ARM 42X72 X-RAY (DRAPES) ×2 IMPLANT
DRAPE POUCH INSTRU U-SHP 10X18 (DRAPES) ×2 IMPLANT
DRAPE SURG 17X11 SM STRL (DRAPES) ×2 IMPLANT
DRAPE U-SHAPE 47X51 STRL (DRAPES) ×2 IMPLANT
DRSG MEPILEX BORDER 4X4 (GAUZE/BANDAGES/DRESSINGS) ×2 IMPLANT
DRSG MEPILEX BORDER 4X8 (GAUZE/BANDAGES/DRESSINGS) ×1 IMPLANT
DURAPREP 26ML APPLICATOR (WOUND CARE) ×2 IMPLANT
ELECT BLADE 4.0 EZ CLEAN MEGAD (MISCELLANEOUS) ×2
ELECT CAUTERY BLADE 6.4 (BLADE) ×2 IMPLANT
ELECT REM PT RETURN 9FT ADLT (ELECTROSURGICAL) ×2
ELECTRODE BLDE 4.0 EZ CLN MEGD (MISCELLANEOUS) ×1 IMPLANT
ELECTRODE REM PT RTRN 9FT ADLT (ELECTROSURGICAL) ×1 IMPLANT
FLOSEAL (HEMOSTASIS) ×1 IMPLANT
GLOVE BIO SURGEON STRL SZ7 (GLOVE) ×3 IMPLANT
GLOVE BIO SURGEON STRL SZ7.5 (GLOVE) ×3 IMPLANT
GLOVE BIOGEL PI IND STRL 7.0 (GLOVE) IMPLANT
GLOVE BIOGEL PI IND STRL 7.5 (GLOVE) IMPLANT
GLOVE BIOGEL PI IND STRL 8.5 (GLOVE) ×1 IMPLANT
GLOVE BIOGEL PI INDICATOR 7.0 (GLOVE) ×3
GLOVE BIOGEL PI INDICATOR 7.5 (GLOVE) ×1
GLOVE BIOGEL PI INDICATOR 8.5 (GLOVE) ×1
GLOVE ECLIPSE 8.5 STRL (GLOVE) ×2 IMPLANT
GOWN PREVENTION PLUS XXLARGE (GOWN DISPOSABLE) ×2 IMPLANT
GOWN STRL NON-REIN LRG LVL3 (GOWN DISPOSABLE) ×2 IMPLANT
KIT BASIN OR (CUSTOM PROCEDURE TRAY) ×2 IMPLANT
KIT STIMULAN RAPID CURE 5CC (Orthopedic Implant) ×1 IMPLANT
NDL SPNL 18GX3.5 QUINCKE PK (NEEDLE) ×2 IMPLANT
NEEDLE 22X1 1/2 (OR ONLY) (NEEDLE) ×2 IMPLANT
NEEDLE SPNL 18GX3.5 QUINCKE PK (NEEDLE) ×4 IMPLANT
NS IRRIG 1000ML POUR BTL (IV SOLUTION) ×2 IMPLANT
PACK LAMINECTOMY ORTHO (CUSTOM PROCEDURE TRAY) ×2 IMPLANT
PACK UNIVERSAL I (CUSTOM PROCEDURE TRAY) ×2 IMPLANT
PATTIES SURGICAL .5 X.5 (GAUZE/BANDAGES/DRESSINGS) IMPLANT
PATTIES SURGICAL .5 X1 (DISPOSABLE) ×2 IMPLANT
PUTTY DBX 5CC (Putty) ×1 IMPLANT
ROD EXEDIUM PREBENT 5.5 40MM (Rod) ×2 IMPLANT
ROD EXEDIUM PREBENT 5.5X40 (Rod) IMPLANT
SCREW EXPEDIUM POLYAXIAL 6X40 (Screw) ×2 IMPLANT
SCREW EXPEDIUM POLYAXIAL 6X45M (Screw) ×2 IMPLANT
SCREW SET SINGLE INNER (Screw) ×4 IMPLANT
SPONGE LAP 4X18 X RAY DECT (DISPOSABLE) IMPLANT
SPONGE SURGIFOAM ABS GEL 100 (HEMOSTASIS) ×2 IMPLANT
STAPLER VISISTAT 35W (STAPLE) ×1 IMPLANT
STRIP CLOSURE SKIN 1/2X4 (GAUZE/BANDAGES/DRESSINGS) ×1 IMPLANT
SUT MNCRL AB 3-0 PS2 18 (SUTURE) ×2 IMPLANT
SUT VIC AB 1 CT1 27 (SUTURE) ×4
SUT VIC AB 1 CT1 27XBRD ANTBC (SUTURE) ×2 IMPLANT
SUT VIC AB 2-0 CT1 18 (SUTURE) ×4 IMPLANT
SUT VICRYL 0 UR6 27IN ABS (SUTURE) ×2 IMPLANT
SYR BULB IRRIGATION 50ML (SYRINGE) ×2 IMPLANT
SYR CONTROL 10ML LL (SYRINGE) ×2 IMPLANT
TOWEL OR 17X26 10 PK STRL BLUE (TOWEL DISPOSABLE) ×4 IMPLANT
TRAY FOLEY CATH 14FR (SET/KITS/TRAYS/PACK) ×1 IMPLANT
WATER STERILE IRR 1000ML POUR (IV SOLUTION) ×2 IMPLANT
YANKAUER SUCT BULB TIP NO VENT (SUCTIONS) ×2 IMPLANT

## 2012-09-28 NOTE — Anesthesia Postprocedure Evaluation (Signed)
  Anesthesia Post-op Note  Patient: [name]  Procedure(s) Performed: Procedure(s): L3  -L5 DECOMPRESSION L4 - L5 FUSION WITH INSTRUMENTATION 2 LEVELS (N/A)  Patient Location: PACU  Anesthesia Type:General  Level of Consciousness: awake, alert , oriented and patient cooperative  Airway and Oxygen Therapy: Patient Spontanous Breathing and Patient connected to nasal cannula oxygen  Post-op Pain: none  Post-op Assessment: Post-op Vital signs reviewed, Patient's Cardiovascular Status Stable, Respiratory Function Stable, Patent Airway, No signs of Nausea or vomiting and Pain level controlled  Post-op Vital Signs: Reviewed and stable  Complications: No apparent anesthesia complications

## 2012-09-28 NOTE — Anesthesia Procedure Notes (Signed)
Procedure Name: Intubation Date/Time: 09/28/2012 12:19 PM Performed by: Wray Kearns A Pre-anesthesia Checklist: Patient identified, Timeout performed, Emergency Drugs available, Suction available and Patient being monitored Patient Re-evaluated:Patient Re-evaluated prior to inductionOxygen Delivery Method: Circle system utilized Preoxygenation: Pre-oxygenation with 100% oxygen Intubation Type: IV induction and Cricoid Pressure applied Ventilation: Mask ventilation without difficulty Laryngoscope Size: Mac Grade View: Grade I Tube type: Oral Tube size: 7.5 mm Number of attempts: 1 Airway Equipment and Method: Stylet and Video-laryngoscopy Placement Confirmation: ETT inserted through vocal cords under direct vision and breath sounds checked- equal and bilateral Secured at: 23 cm Tube secured with: Tape Dental Injury: Teeth and Oropharynx as per pre-operative assessment

## 2012-09-28 NOTE — Brief Op Note (Signed)
09/28/2012  4:41 PM  PATIENT:  [name]  77 y.o. male  PRE-OPERATIVE DIAGNOSIS:  denegrative spondylothesis with stenosis  POST-OPERATIVE DIAGNOSIS:  denegrative spondylothesis with stenosis  PROCEDURE:  Procedure(s): L3  -L5 DECOMPRESSION L4 - L5 FUSION WITH INSTRUMENTATION 2 LEVELS (N/A)  SURGEON:  Surgeon(s) and Role:    * Venita Lick, MD - Primary  PHYSICIAN ASSISTANT:   ASSISTANTS: none   ANESTHESIA:   general  EBL:  Total I/O In: 3300 [I.V.:3050; IV Piggyback:250] Out: 1650 [Urine:950; Blood:700]  BLOOD ADMINISTERED:none  DRAINS: Penrose drain in the 1   LOCAL MEDICATIONS USED:  MARCAINE     SPECIMEN:  No Specimen  DISPOSITION OF SPECIMEN:  N/A  COUNTS:  YES  TOURNIQUET:  * No tourniquets in log *  DICTATION: .Other Dictation: Dictation Number [id number]  PLAN OF CARE: Admit to inpatient   PATIENT DISPOSITION:  PACU - hemodynamically stable.

## 2012-09-28 NOTE — Anesthesia Preprocedure Evaluation (Signed)
Anesthesia Evaluation  Patient identified by MRN, date of birth, ID band Patient awake    Reviewed: Allergy & Precautions, H&P , NPO status , Patient's Chart, lab work & pertinent test results  Airway Mallampati: I TM Distance: >3 FB Neck ROM: Full    Dental   Pulmonary  breath sounds clear to auscultation        Cardiovascular hypertension, + CAD Rhythm:Regular Rate:Normal     Neuro/Psych    GI/Hepatic GERD-  ,  Endo/Other    Renal/GU Renal InsufficiencyRenal disease     Musculoskeletal   Abdominal   Peds  Hematology   Anesthesia Other Findings   Reproductive/Obstetrics                           Anesthesia Physical Anesthesia Plan  ASA: III  Anesthesia Plan: General   Post-op Pain Management:    Induction: Intravenous  Airway Management Planned: Oral ETT  Additional Equipment:   Intra-op Plan:   Post-operative Plan: Extubation in OR  Informed Consent: I have reviewed the patients History and Physical, chart, labs and discussed the procedure including the risks, benefits and alternatives for the proposed anesthesia with the patient or authorized representative who has indicated his/her understanding and acceptance.     Plan Discussed with: CRNA and Surgeon  Anesthesia Plan Comments:         Anesthesia Quick Evaluation

## 2012-09-28 NOTE — Progress Notes (Signed)
No change in clinical exam H+P reviewed  

## 2012-09-28 NOTE — Transfer of Care (Signed)
Immediate Anesthesia Transfer of Care Note  Patient: [name]  Procedure(s) Performed: Procedure(s): L3  -L5 DECOMPRESSION L4 - L5 FUSION WITH INSTRUMENTATION 2 LEVELS (N/A)  Patient Location: PACU  Anesthesia Type:General  Level of Consciousness: oriented, sedated, patient cooperative and responds to stimulation  Airway & Oxygen Therapy: Patient Spontanous Breathing and Patient connected to nasal cannula oxygen  Post-op Assessment: Report given to PACU RN, Post -op Vital signs reviewed and stable, Patient moving all extremities and Patient moving all extremities X 4  Post vital signs: Reviewed and stable  Complications: No apparent anesthesia complications

## 2012-09-28 NOTE — Preoperative (Signed)
Beta Blockers   Reason not to administer Beta Blockers:Metoprolol 25 mg po @0830hr  on o2/27/2014

## 2012-09-29 ENCOUNTER — Inpatient Hospital Stay (HOSPITAL_COMMUNITY): Payer: Medicare Other

## 2012-09-29 MED ORDER — CEFAZOLIN SODIUM 1-5 GM-% IV SOLN
1.0000 g | Freq: Three times a day (TID) | INTRAVENOUS | Status: AC
  Administered 2012-09-29 (×2): 1 g via INTRAVENOUS
  Filled 2012-09-29 (×2): qty 50

## 2012-09-29 NOTE — Evaluation (Signed)
Physical Therapy Evaluation Patient Details Name: [name] MRN: [id number] DOB: 11/27/1935 Today's Date: 09/29/2012 Time: 4098-1191 PT Time Calculation (min): 38 min  PT Assessment / Plan / Recommendation Clinical Impression  pt presents with L3-5 Decompression and L4-5 PLIF.  pt very motivated and moving well.  Anticipate great progress.      PT Assessment  Patient needs continued PT services    Follow Up Recommendations  Outpatient PT    Does the patient have the potential to tolerate intense rehabilitation      Barriers to Discharge None      Equipment Recommendations  None recommended by PT    Recommendations for Other Services OT consult   Frequency Min 5X/week    Precautions / Restrictions Precautions Precautions: Back Precaution Booklet Issued: Yes (comment) Required Braces or Orthoses: Spinal Brace Spinal Brace: Lumbar corset;Applied in sitting position Restrictions Weight Bearing Restrictions: No   Pertinent Vitals/Pain Indicates 4-5/10.  Pain meds due at end of session.  RN made aware.        Mobility  Bed Mobility Bed Mobility: Rolling Right;Right Sidelying to Sit;Sitting - Scoot to Edge of Bed Rolling Right: 4: Min guard Right Sidelying to Sit: 4: Min guard;With rails Sitting - Scoot to Edge of Bed: 5: Supervision Details for Bed Mobility Assistance: cues for log roll and sequencing.   Transfers Transfers: Sit to Stand;Stand to Sit Sit to Stand: 4: Min guard;With upper extremity assist;From bed Stand to Sit: 4: Min guard;With upper extremity assist;To bed;To chair/3-in-1 Details for Transfer Assistance: cues for UE use and to get closer prior to sitting.   Ambulation/Gait Ambulation/Gait Assistance: 4: Min guard Ambulation Distance (Feet): 150 Feet (x2) Assistive device: None Ambulation/Gait Assistance Details: pt moves very cautiously, but without physical A.   Gait Pattern: Step-through pattern;Decreased stride length Stairs: Yes Stairs  Assistance: 4: Min guard Stair Management Technique: No rails;Forwards;Step to pattern Number of Stairs: 3 Wheelchair Mobility Wheelchair Mobility: No    Exercises     PT Diagnosis: Difficulty walking  PT Problem List: Decreased activity tolerance;Decreased balance;Decreased mobility;Decreased knowledge of use of DME;Decreased knowledge of precautions;Pain PT Treatment Interventions: DME instruction;Gait training;Stair training;Functional mobility training;Therapeutic activities;Therapeutic exercise;Balance training;Patient/family education   PT Goals Acute Rehab PT Goals PT Goal Formulation: With patient Time For Goal Achievement: 10/06/12 Potential to Achieve Goals: Good Pt will Roll Supine to Right Side: with modified independence PT Goal: Rolling Supine to Right Side - Progress: Goal set today Pt will Roll Supine to Left Side: with modified independence PT Goal: Rolling Supine to Left Side - Progress: Goal set today Pt will go Supine/Side to Sit: with modified independence PT Goal: Supine/Side to Sit - Progress: Goal set today Pt will go Sit to Supine/Side: with modified independence PT Goal: Sit to Supine/Side - Progress: Goal set today Pt will go Sit to Stand: with modified independence PT Goal: Sit to Stand - Progress: Goal set today Pt will go Stand to Sit: with modified independence PT Goal: Stand to Sit - Progress: Goal set today Pt will Ambulate: >150 feet;with modified independence PT Goal: Ambulate - Progress: Goal set today Pt will Go Up / Down Stairs: 1-2 stairs;with supervision PT Goal: Up/Down Stairs - Progress: Goal set today Additional Goals Additional Goal #1: pt will verbalize and follow back precautions.   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   Patient Stated Goal: Work on  [employer] without pain.     Prior Functioning  Home Living Lives With: Spouse Available Help at Discharge: Family;Available 24 hours/day Type of Home: House Home Access: Stairs to enter [employer] of Steps: 2 Entrance Stairs-Rails: None Home Layout: One level Bathroom Shower/Tub: Health visitor: Standard Home Adaptive Equipment: Raised toilet seat with rails;Straight cane;Walker - standard Prior Function Level of Independence: Independent Able to Take Stairs?: Yes Driving: Yes Vocation: Full time employment Comments: pt owns a beef farm.   Communication Communication: No difficulties Dominant Hand: Right    Cognition  Cognition Overall Cognitive Status: Appears within functional limits for tasks assessed/performed Arousal/Alertness: Awake/alert Orientation Level: Appears intact for tasks assessed Behavior During Session: [facility] for tasks performed    Extremity/Trunk Assessment Right Lower Extremity Assessment RLE ROM/Strength/Tone: WFL for tasks assessed RLE Sensation: WFL - Light Touch Left Lower Extremity Assessment LLE ROM/Strength/Tone: WFL for tasks assessed LLE Sensation: WFL - Light Touch Trunk Assessment Trunk Assessment: Normal   Balance Balance Balance Assessed: No  End of Session PT - End of Session Equipment Utilized During Treatment: Gait belt;Back brace Activity Tolerance: Patient tolerated treatment well Patient left: in chair;with call bell/phone within reach Nurse Communication: Mobility status  GP     Sunny Schlein, [place] [phone] 09/29/2012, 9:35 AM

## 2012-09-29 NOTE — Plan of Care (Signed)
Problem: Phase II Progression Outcomes Goal: Discharge plan established No follow up OT recommended at this time     

## 2012-09-29 NOTE — Progress Notes (Signed)
PT is recommending  Outpatient PT and not SNF. CSW will make CM aware. Clinical Social Worker will sign off for now as social work intervention is no longer needed. Please consult [place] again if new need arises.   Sabino Niemann, MSW [phone]

## 2012-09-29 NOTE — Progress Notes (Signed)
UR COMPLETED  

## 2012-09-29 NOTE — Progress Notes (Signed)
    Subjective: Procedure(s) (LRB): L3  -L5 DECOMPRESSION L4 - L5 FUSION WITH INSTRUMENTATION 2 LEVELS (N/A) 1 Day Post-Op  Patient reports pain as 3 on 0-10 scale.  Reports decreased leg pain reports incisional back pain   Positive void Negative bowel movement Negative flatus Negative chest pain or shortness of breath  Objective: Vital signs in last 24 hours: Temp:  [97 F (36.1 C)-98.2 F (36.8 C)] 98 F (36.7 C) (02/28 0538) Pulse Rate:  [62-101] 75 (02/28 0538) Resp:  [9-18] 18 (02/28 0538) BP: (94-126)/(44-78) 124/62 mmHg (02/28 0538) SpO2:  [92 %-100 %] 100 % (02/28 0538)  Intake/Output from previous day: 02/27 0701 - 02/28 0700 In: 3950 [I.V.:3700; IV Piggyback:250] Out: 4098 [Urine:2900; Drains:498; Blood:700]  Labs:  Recent Labs  09/26/12 1325  WBC 7.0  RBC 4.15*  HCT 37.6*  PLT 165    Recent Labs  09/26/12 1325  NA 142  K 3.7  CL 107  CO2 26  BUN 17  CREATININE 1.37*  GLUCOSE 117*  CALCIUM 9.9   No results found for this basename: LABPT, INR,  in the last 72 hours  Physical Exam: Neurologically intact ABD soft Neurovascular intact Intact pulses distally Dorsiflexion/Plantar flexion intact Incision: dressing C/D/I Compartment soft drain: 498 overnight  Assessment/Plan: Patient stable  xrays pending Continue mobilization with physical therapy Continue care  Advance diet Up with therapy Continue Abx as drain will remain in place Mobilization today Possible d/c over weekend.  Venita Lick, MD [facility] [phone]

## 2012-09-29 NOTE — Evaluation (Signed)
Occupational Therapy Evaluation Patient Details Name: [name] MRN: [id number] DOB: 30-Sep-1935 Today's Date: 09/29/2012 Time: 6962-9528 OT Time Calculation (min): 31 min  OT Assessment / Plan / Recommendation Clinical Impression  Pt referred to OT services following back surgery. Pt at set up - sup level with UB ADLs and min A with LB ADLs, sup with ADL mobility. All education completed and pt requires no further acute or follow up OT at this time, OT will sign off    OT Assessment  Patient does not need any further OT services    Follow Up Recommendations  No OT follow up    Barriers to Discharge  None    Equipment Recommendations   shower chair   Recommendations for Other Services    Frequency       Precautions / Restrictions Precautions Precautions: Back Precaution Booklet Issued: Yes (comment) Precaution Comments: pt able to recall 3/3 back precautions Required Braces or Orthoses: Spinal Brace Spinal Brace: Lumbar corset;Applied in sitting position Restrictions Weight Bearing Restrictions: No       ADL  Grooming: Performed;Wash/dry hands;Wash/dry face;Supervision/safety Where Assessed - Grooming: Supported standing Upper Body Bathing: Simulated;Supervision/safety;Set up Lower Body Bathing: Minimal assistance;Simulated Upper Body Dressing: Performed;Supervision/safety;Set up Lower Body Dressing: Performed;Minimal assistance Toilet Transfer: Performed;Supervision/safety Toilet Transfer Method: Sit to stand;Other (comment) (ambulating with no AD) Toilet Transfer Equipment: Raised toilet seat with arms (or 3-in-1 over toilet) Toileting - Clothing Manipulation and Hygiene: Performed;Supervision/safety Tub/Shower Transfer: Supervision/safety;Performed Tub/Shower Transfer Method: Science writer: Shower seat with back;Grab bars;Walk in shower Equipment Used: Long-handled shoe horn;Long-handled sponge;Reacher;Gait belt;Sock aid ADL Comments:  Pt and his wife provided with education and demo of ADL A/E for use at home. Pt educated on safey use of a shower chair, however, he declines demo/instruction    OT Diagnosis:    OT Problem List:   OT Treatment Interventions:     OT Goals    Visit Information  Last OT Received On: 09/29/12    Subjective Data  Subjective: " I will go home tomorrow " Patient Stated Goal: To return home   Prior Functioning     Home Living Lives With: Spouse Available Help at Discharge: Family;Available 24 hours/day Type of Home: House Home Access: Stairs to enter [employer] of Steps: 2 Entrance Stairs-Rails: None Home Layout: One level Bathroom Shower/Tub: Health visitor: Standard Home Adaptive Equipment: Raised toilet seat with rails;Straight cane;Walker - standard Prior Function Level of Independence: Independent Able to Take Stairs?: Yes Driving: Yes Vocation: Full time employment Comments: 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 for tasks performed    Extremity/Trunk Assessment Right Upper Extremity Assessment RUE ROM/Strength/Tone: [facility] for tasks assessed Left Upper Extremity Assessment LUE ROM/Strength/Tone: WFL for tasks assessed     Mobility Bed Mobility Bed Mobility: Rolling Right;Right Sidelying to Sit;Sitting - Scoot to Edge of Bed Rolling Right: 4: Min guard Right Sidelying to Sit: 4: Min guard;With rails Sitting - Scoot to Edge of Bed: 5: Supervision Details for Bed Mobility  Assistance: Sup with log roll technique Transfers Transfers: Sit to Stand;Stand to Sit Sit to Stand:  4: Min guard;With upper extremity assist;From bed;From chair/3-in-1;5: Supervision Stand to Sit: 4: Min guard;With upper extremity assist;To bed;To chair/3-in-1;5: Supervision     Exercise     Balance     End of Session OT - End of Session Equipment Utilized During Treatment: Gait belt;Back brace (3 in 1, ADL A/E) Activity Tolerance: Patient tolerated treatment well Patient left: in bed;with call bell/phone within reach;with family/visitor present  GO     Galen Manila 09/29/2012, 2:01 PM

## 2012-09-29 NOTE — Op Note (Signed)
NAMEYIANNIS, TULLOCH                ACCOUNT NO.:  [id number]  MEDICAL RECORD NO.:  [id number]  LOCATION:  [id number]                        FACILITY:  [facility]  PHYSICIAN:  Alvy Beal, MD    DATE OF BIRTH:  07-27-1936  DATE OF PROCEDURE:  09/28/2012 DATE OF DISCHARGE:                              OPERATIVE REPORT   PREOPERATIVE DIAGNOSES: 1. Degenerative lumbar spinal stenosis, L3-4, L4-5. 2. Degenerative spondylolisthesis L4-5.  POSTOPERATIVE DIAGNOSES: 1. Degenerative lumbar spinal stenosis, L3-4, L4-5. 2. Degenerative spondylolisthesis L4-5.  OPERATIVE PROCEDURE:  Posterior lumbar decompression L3-4, L4-5 with Gill decompression to left side of L4-5, posterolateral arthrodesis L4- 5, posterolateral instrumented fusion segmental with pedicle screw fixation L4-5.  COMPLICATIONS:  None.  CONDITION:  Stable.  HISTORY:  This is a very pleasant 77 year old gentleman, who has been having severe back, buttock, and bilateral leg pain left side much more significant than the right.  Attempts at conservative management has failed to alleviate his symptoms.  As a result of progression in his pain and loss of quality of life, we elected to proceed with surgery. All appropriate risks, benefits, and alternatives were discussed with the patient and his family and consent was obtained.  OPERATIVE NOTE:  The patient was brought to the operating room, placed supine on the operating table.  After successful induction of general anesthesia and endotracheal intubation, TEDs, SCDs, and a Foley were inserted.  The patient was turned prone onto the Wilson frame.  All bony prominences were well padded.  The back was prepped and draped in a standard fashion.  Time-out was then taken to confirm patient procedure and all other pertinent important data.  Once this was completed, midline incision was made starting at the superior aspect of L3 proceeding to the inferior aspect of L5.  Sharp dissection was  carried out down to the deep fascia.  Deep fascia was sharply incised and I stripped the paraspinal muscles out to the level of the facet complex at L3, L4, and L5.  I then confirmed with lateral fluoroscopy the L4 pedicle, the L4-5 disk space.  Once this was confirmed, I then removed the entire facet capsule at L4-5.  I then dissected out laterally to expose the L5 transverse process.  I then went superiorly to protect but this time I did not sacrifice the L3-4 facet complex but exposed the L4 transverse process.  At this point in time, I had the complete posterolateral aspect of the spine exposed.  I once again confirmed the appropriate level and then used a double-action Leksell rongeur to remove the spinous process of L4 and the majority of that of L3.  I then used a fine nerve hook to develop a plane underneath the L4 lamina and then I performed a complete laminectomy using 2 and 3 mm Kerrison rongeurs.  Once I had completed the L4 laminectomy, I then proceeded superiorly and did a partial laminotomy of L3.  This allowed me to adequately decompress the central canal at L3-4 and L4-5.  I then continued my dissection into the posterolateral corner.  On the right- hand side, I dissected out until I could identify the L4 and L5 pedicles.  I  removed the thickened ligamentum flavum and the bone spurs. I then identified the appropriate level disk to know that my decompression was adequate.  I then went into the L4-L5 foramen and decompressed these as well.  Once the foraminotomy was completed, I could easily take a [facility], passed it along the L5 nerve root into the L5 foramen, into the L4 foramen, and superiorly towards the L3 foramen.  There was an adequate decompression centrally and in the lateral recess on the right side.  There was no residual nerve compression.  At this point, I went to the contralateral side.  Because of the significant compression on the left side, I  removed the entire L4 inferior facet complex.  Once this was removed, I continued to dissect in the lateral gutter with Kerrison rongeurs to remove the overhanging osteophyte and completely expose the medial portion of the L5 pedicle. I then went superiorly and removed the pars of L4 so that I could adequately decompress the L4 nerve root in the foramen.  I then identified the pedicle of L4 and the L4 nerve root itself.  I then continued my dissection in the lateral recess superiorly.  At this point, I had an adequate lateral decompression.  I could directly visualize the L5 and the L4 nerve root, indicating I had an adequate decompression.  At this point, I irrigated the wound copiously with normal saline.  I then used a pedicle probe and identified the appropriate starting position for the L4 pedicle at the junction of the transverse process and facet complex.  I then advanced the awl and then placed the pedicle probe through the pedicle and into the L4 vertebral body.  I then palpated the hole with a ball-tip Feeler and then tapped, repalpated, and then placed the screw.  This was repeated at L5 and on the contralateral side.  I then was able to directly visualize the medial aspects of the L4 and L5 pedicles and there was no evidence of breach. I then confirmed on fluoroscopy that I had satisfactory position of all 4 screws.  Once this was confirmed, I then decorticated the lateral recess with a high-speed bur and then placed bone graft with DBX plus local bone.  I then placed this in the posterolateral gutter, secured the 40 mm rod to the screws and torqued the locking nuts according to the manufacture's standards.  Once this was done, I irrigated copiously with normal saline, used bipolar electrocautery to obtain hemostasis.  I then placed thrombin- soaked Gelfoam patty over the exposed thecal sac.  I then placed antibiotic impregnated beads (vancomycin) in the wound to help  decrease the risk of infection given his age.  I then closed the deep fascia over drain with #1 Vicryl sutures.  I then closed the subcutaneous with 2-0 Vicryl sutures and 3-0 Monocryl.  Steri-Strips and dry dressing were applied.  The patient was extubated, transferred to the PACU without incident.  At the end of the case, all needle and sponge counts were correct.  There were no adverse intraoperative events.     Alvy Beal, MD     DDB/MEDQ  D:  09/28/2012  T:  09/29/2012  Job:  [id number]

## 2012-09-30 MED ORDER — METHOCARBAMOL 500 MG PO TABS: 500.0000 mg | ORAL_TABLET | Freq: Three times a day (TID) | ORAL | Status: DC | PRN

## 2012-09-30 MED ORDER — ONDANSETRON HCL 4 MG PO TABS: 4.0000 mg | ORAL_TABLET | Freq: Three times a day (TID) | ORAL | Status: DC | PRN

## 2012-09-30 MED ORDER — POLYETHYLENE GLYCOL 3350 17 GM/SCOOP PO POWD: 17.0000 g | Freq: Every day | ORAL | Status: DC

## 2012-09-30 MED ORDER — FLEET ENEMA 7-19 GM/118ML RE ENEM
1.0000 | ENEMA | Freq: Once | RECTAL | Status: AC
  Administered 2012-09-30: 1 via RECTAL
  Filled 2012-09-30: qty 1

## 2012-09-30 MED ORDER — DOCUSATE SODIUM 100 MG PO CAPS: 100.0000 mg | ORAL_CAPSULE | Freq: Three times a day (TID) | ORAL | Status: DC | PRN

## 2012-09-30 MED ORDER — OXYCODONE-ACETAMINOPHEN 10-325 MG PO TABS: 1.0000 | ORAL_TABLET | ORAL | Status: DC | PRN

## 2012-09-30 MED ORDER — HYDROCODONE-ACETAMINOPHEN 10-325 MG PO TABS: 1.0000 | ORAL_TABLET | Freq: Four times a day (QID) | ORAL | Status: DC | PRN

## 2012-09-30 NOTE — Progress Notes (Signed)
    Subjective: Procedure(s) (LRB): L3  -L5 DECOMPRESSION L4 - L5 FUSION WITH INSTRUMENTATION 2 LEVELS (N/A) 2 Days Post-Op  Patient reports pain as 3 on 0-10 scale.  Reports decreased leg pain reports incisional back pain   Positive void Negative bowel movement Positive flatus Negative chest pain or shortness of breath  Objective: Vital signs in last 24 hours: Temp:  [97.7 F (36.5 C)-98.2 F (36.8 C)] 97.7 F (36.5 C) (03/01 0556) Pulse Rate:  [93-102] 100 (03/01 0556) Resp:  [18-20] 18 (03/01 0556) BP: (116-122)/(37-67) 117/37 mmHg (03/01 0556) SpO2:  [90 %-97 %] 95 % (03/01 0556)  Intake/Output from previous day: 02/28 0701 - 03/01 0700 In: 350 [P.O.:350] Out: 500 [Urine:250; Drains:250]  Labs: No results found for this basename: WBC, RBC, HCT, PLT,  in the last 72 hours No results found for this basename: NA, K, CL, CO2, BUN, CREATININE, GLUCOSE, CALCIUM,  in the last 72 hours No results found for this basename: LABPT, INR,  in the last 72 hours  Physical Exam: Neurologically intact ABD soft Neurovascular intact Intact pulses distally Incision: dressing C/D/I and no drainage No cellulitis present Compartment soft drain - 50cc overnight  Assessment/Plan: Patient stable  xrays satisfactory Continue mobilization with physical therapy Continue care  Advance diet Up with therapy Plan on d/c to home if drainage remains low (less than 50) by afternoon  Venita Lick, MD [facility] [phone]

## 2012-09-30 NOTE — Progress Notes (Signed)
Physical Therapy Treatment Patient Details Name: [name] MRN: [id number] DOB: Sep 02, 1935 Today's Date: 09/30/2012 Time: 0750-0806 PT Time Calculation (min): 16 min  PT Assessment / Plan / Recommendation Comments on Treatment Session  Did well. Antipate DC today    Follow Up Recommendations  Outpatient PT     Does the patient have the potential to tolerate intense rehabilitation     Barriers to Discharge        Equipment Recommendations  None recommended by PT    Recommendations for Other Services    Frequency Min 5X/week   Plan      Precautions / Restrictions Precautions Precautions: Back Precaution Comments: pt able to recall 3/3 back precautions Spinal Brace: Lumbar corset;Applied in sitting position   Pertinent Vitals/Pain     Mobility  Bed Mobility Bed Mobility: Not assessed Transfers Sit to Stand: 6: Modified independent (Device/Increase time) Stand to Sit: 6: Modified independent (Device/Increase time) Ambulation/Gait Ambulation/Gait Assistance: 6: Modified independent (Device/Increase time) Ambulation Distance (Feet): 600 Feet Assistive device: None Gait Pattern: Within Functional Limits Stairs Assistance: 6: Modified independent (Device/Increase time) Stair Management Technique: No rails Number of Stairs: 3    Exercises     PT Diagnosis:    PT Problem List:   PT Treatment Interventions:     PT Goals Acute Rehab PT Goals PT Goal: Sit to Stand - Progress: Met PT Goal: Stand to Sit - Progress: Met PT Goal: Ambulate - Progress: Met PT Goal: Up/Down Stairs - Progress: Met Additional Goals PT Goal: Additional Goal #1 - Progress: Met  Visit Information  Last PT Received On: 09/30/12 Assistance Needed: +1    Subjective Data      Cognition  Cognition Overall Cognitive Status: Appears within functional limits for tasks assessed/performed Arousal/Alertness: Awake/alert Orientation Level: Oriented X4 / Intact Behavior During Session: [facility] for  tasks performed    Balance     End of Session PT - End of Session Equipment Utilized During Treatment: Gait belt;Back brace Activity Tolerance: Patient tolerated treatment well Patient left: in chair;with call bell/phone within reach Nurse Communication: Mobility status   GP     Fredrich Birks 09/30/2012, 8:50 AM 09/30/2012 Fredrich Birks PTA [phone] pager [phone] office

## 2012-09-30 NOTE — Discharge Summary (Signed)
Patient ID: [name] MRN: [id number] DOB/AGE: June 27, 1936 77 y.o.  Admit date: 09/28/2012 Discharge date: 09/30/2012  Admission Diagnoses:  Active Problems:   * No active hospital problems. *   Discharge Diagnoses:  Active Problems:   * No active hospital problems. *  status post Procedure(s): L3  -L5 DECOMPRESSION L4 - L5 FUSION WITH INSTRUMENTATION 2 LEVELS  Past Medical History  Diagnosis Date  . CAD (coronary artery disease)     previous myocardial infarction in 2001 with 90% circumflex lesion treated with PTCA. The LAD had 70-80% stenosis which was treawted with angioplasty. Most recent catheterization in 2003 demonstrated the LAD at 30% mid stenosis, circumflex 60-70% stenosis in the mid AV grove, and 40% stenosis in the previously stented area. The right coronary artery had 30-40% stenosis in the mid portion.   Marland Kitchen CAD (coronary artery disease)     The EF was 55%. He had PTCA and stenting in the AV circumflex. His last stress perfustion study was in 2006, previous syncope with a negative electrophysiology workup, gastroesopageal flux disease, dyslipidemia (fllowed in [facility]), arthritis.  . Hypertension   . Stones in the urinary tract   . GERD (gastroesophageal reflux disease)   . Arthritis     Surgeries: Procedure(s): L3  -L5 DECOMPRESSION L4 - L5 FUSION WITH INSTRUMENTATION 2 LEVELS on 09/28/2012   Consultants:  none  Discharged Condition: Improved  Hospital Course: [name] is an 77 y.o. male who was admitted 09/28/2012 for operative treatment of <principal problem not specified>. Patient failed conservative treatments (please see the history and physical for the specifics) and had severe unremitting pain that affects sleep, daily activities and work/hobbies. After pre-op clearance, the patient was taken to the operating room on 09/28/2012 and underwent  Procedure(s): L3  -L5 DECOMPRESSION L4 - L5 FUSION WITH INSTRUMENTATION 2 LEVELS.    Patient  was given perioperative antibiotics: Anti-infectives   Start     Dose/Rate Route Frequency Ordered Stop   09/29/12 0800  ceFAZolin (ANCEF) IVPB 1 g/50 mL premix     1 g 100 mL/hr over 30 Minutes Intravenous Every 8 hours 09/29/12 0758 09/29/12 1709   09/28/12 1900  ceFAZolin (ANCEF) IVPB 1 g/50 mL premix     1 g 100 mL/hr over 30 Minutes Intravenous Every 8 hours 09/28/12 1852 09/29/12 0311   09/28/12 1409  vancomycin (VANCOCIN) powder  Status:  Discontinued       As needed 09/28/12 1409 09/28/12 1642   09/27/12 1436  ceFAZolin (ANCEF) IVPB 2 g/50 mL premix     2 g 100 mL/hr over 30 Minutes Intravenous 30 min pre-op 09/27/12 1436 09/28/12 1210       Patient was given sequential compression devices and early ambulation to prevent DVT.   Patient benefited maximally from hospital stay and there were no complications. At the time of discharge, the patient was urinating/moving their bowels without difficulty, tolerating a regular diet, pain is controlled with oral pain medications and they have been cleared by PT/OT.   Recent vital signs: Patient Vitals for the past 24 hrs:  BP Temp Pulse Resp SpO2  09/30/12 0556 117/37 mmHg 97.7 F (36.5 C) 100 18 95 %  09/29/12 2127 122/67 mmHg 98.2 F (36.8 C) 93 18 90 %  09/29/12 1400 116/41 mmHg 98.1 F (36.7 C) 102 20 97 %     Recent laboratory studies: No results found for this basename: WBC, HGB, HCT, PLT, NA, K, CL, CO2,  BUN, CREATININE, GLUCOSE, PT, INR, CALCIUM, 2,  in the last 72 hours   Discharge Medications:     Medication List    ASK your doctor about these medications       aspirin 81 MG EC tablet  Take 81 mg by mouth daily.     atorvastatin 80 MG tablet  Commonly known as:  LIPITOR  Take 1 tablet (80 mg total) by mouth daily.     folic acid 400 MCG tablet  Commonly known as:  FOLVITE  Take 400 mcg by mouth daily.     metoprolol tartrate 25 MG tablet  Commonly known as:  LOPRESSOR  Take 1 tablet (25 mg total) by mouth 2  (two) times daily.     NEXIUM 40 MG capsule  Generic drug:  esomeprazole  Take 40 mg by mouth daily before breakfast.     niacin 1000 MG CR tablet  Commonly known as:  NIASPAN  Take 1 tablet (1,000 mg total) by mouth at bedtime.     nitroGLYCERIN 0.4 MG SL tablet  Commonly known as:  NITROSTAT  Place 1 tablet (0.4 mg total) under the tongue every 5 (five) minutes as needed.        Diagnostic Studies: Dg Chest 2 View  09/26/2012  *RADIOLOGY REPORT*  Clinical Data: Preop lumbar decompression  CHEST - 2 VIEW  Comparison: 01/12/2011  Findings: Lungs are essentially clear.  No focal consolidation. No pleural effusion or pneumothorax.  Cardiomediastinal silhouette is within normal limits.  Mild degenerative changes of the visualized thoracolumbar spine. Right shoulder arthroplasty.  IMPRESSION: No evidence of acute cardiopulmonary disease.   Original Report Authenticated By: Charline Bills, M.D.    Dg Lumbar Spine 2-3 Views  09/29/2012  *RADIOLOGY REPORT*  Clinical Data: Post fusion.  LUMBAR SPINE - 2-3 VIEW  Comparison: 09/26/2012  Findings: Changes of posterior fusion at L4-5.  Stable 6 mm of anterolisthesis of L4 on L5.  No hardware complicating feature. Degenerative facet disease throughout the lumbar spine.  Mild degenerative disc disease at L3-4.  IMPRESSION: Posterior fusion changes at L4-5 with stable alignment.  No complicating feature.   Original Report Authenticated By: Charlett Nose, M.D.    Dg Lumbar Spine 2-3 Views  09/28/2012  *RADIOLOGY REPORT*  Clinical Data: Back pain  LUMBAR SPINE - 2-3 VIEW,DG C-ARM GT 120 MIN  Comparison: Plain films 09/26/2012.  Findings: C-arm films document L4-L5 posterolateral fusion.  IMPRESSION: Hardware grossly intact.  Anterolisthesis slightly improved.   Original Report Authenticated By: Davonna Belling, M.D.    Dg Lumbar Spine 2-3 Views  09/26/2012  *RADIOLOGY REPORT*  Clinical Data: Back pain  LUMBAR SPINE - 2-3 VIEW  Comparison: None.  Findings: 7 mm  anterolisthesis L4-5 with disc space narrowing. Moderate disc space narrowing L3-L4.  Asymmetric loss of interspace height L2-3 on the left.  Nephrolithiasis.  Vascular calcification.  Facet arthropathy.  IMPRESSION: Lumbar spondylosis as described.   Original Report Authenticated By: Davonna Belling, M.D.    Dg C-arm Gt 120 Min  09/28/2012  *RADIOLOGY REPORT*  Clinical Data: Back pain  LUMBAR SPINE - 2-3 VIEW,DG C-ARM GT 120 MIN  Comparison: Plain films 09/26/2012.  Findings: C-arm films document L4-L5 posterolateral fusion.  IMPRESSION: Hardware grossly intact.  Anterolisthesis slightly improved.   Original Report Authenticated By: Davonna Belling, M.D.           Follow-up Information   Follow up with Alvy Beal, MD. Call in 2 weeks. (As needed if symptoms worsen)  Contact information:   [address] [place] [address] [phone]       Discharge Plan:  discharge to home   Disposition:  stable   Signed: Venita Lick D for Dr. Venita Lick [facility] [phone] 09/30/2012, 9:41 AM

## 2012-10-01 ENCOUNTER — Encounter (HOSPITAL_COMMUNITY): Payer: Self-pay | Admitting: Orthopedic Surgery

## 2012-10-02 NOTE — Progress Notes (Addendum)
Msg from office, no PT on back at this time. Will notify pt and [facility]. Pt will follow up with physician at his appt regarding therapy. Spoke to rehab to cancel referral. Left message for pt to give NCM call back.  Isidoro Donning RN CCM Case Mgmt phone [phone]

## 2012-10-24 ENCOUNTER — Emergency Department (HOSPITAL_COMMUNITY): Payer: Medicare Other

## 2012-10-24 ENCOUNTER — Emergency Department (HOSPITAL_COMMUNITY)
Admission: EM | Admit: 2012-10-24 | Discharge: 2012-10-24 | Disposition: A | Discharge status: Home / Self Care | Payer: Medicare Other | Attending: Emergency Medicine | Admitting: Emergency Medicine

## 2012-10-24 ENCOUNTER — Encounter (HOSPITAL_COMMUNITY): Payer: Self-pay | Admitting: Emergency Medicine

## 2012-10-24 DIAGNOSIS — Z87891 Personal history of nicotine dependence: Secondary | ICD-10-CM | POA: Insufficient documentation

## 2012-10-24 DIAGNOSIS — Z9861 Coronary angioplasty status: Secondary | ICD-10-CM | POA: Insufficient documentation

## 2012-10-24 DIAGNOSIS — Z7982 Long term (current) use of aspirin: Secondary | ICD-10-CM | POA: Insufficient documentation

## 2012-10-24 DIAGNOSIS — Z79899 Other long term (current) drug therapy: Secondary | ICD-10-CM | POA: Insufficient documentation

## 2012-10-24 DIAGNOSIS — Z87442 Personal history of urinary calculi: Secondary | ICD-10-CM | POA: Insufficient documentation

## 2012-10-24 DIAGNOSIS — I2510 Atherosclerotic heart disease of native coronary artery without angina pectoris: Secondary | ICD-10-CM | POA: Insufficient documentation

## 2012-10-24 DIAGNOSIS — M129 Arthropathy, unspecified: Secondary | ICD-10-CM | POA: Insufficient documentation

## 2012-10-24 DIAGNOSIS — I10 Essential (primary) hypertension: Secondary | ICD-10-CM | POA: Insufficient documentation

## 2012-10-24 DIAGNOSIS — N201 Calculus of ureter: Secondary | ICD-10-CM

## 2012-10-24 LAB — COMPREHENSIVE METABOLIC PANEL
ALT: 15 U/L (ref 0–53)
AST: 18 U/L (ref 0–37)
Albumin: 3.4 g/dL — ABNORMAL LOW (ref 3.5–5.2)
CO2: 23 mEq/L (ref 19–32)
Calcium: 9.4 mg/dL (ref 8.4–10.5)
Creatinine, Ser: 1.18 mg/dL (ref 0.50–1.35)
GFR calc non Af Amer: 59 mL/min — ABNORMAL LOW (ref >90)
Sodium: 139 mEq/L (ref 135–145)
Total Protein: 6.6 g/dL (ref 6.0–8.3)

## 2012-10-24 LAB — URINALYSIS, ROUTINE W REFLEX MICROSCOPIC
Bilirubin Urine: NEGATIVE
Glucose, UA: NEGATIVE mg/dL
Specific Gravity, Urine: 1.021 (ref 1.005–1.030)
Urobilinogen, UA: 0.2 mg/dL (ref 0.0–1.0)
pH: 5.5 (ref 5.0–8.0)

## 2012-10-24 LAB — CBC WITH DIFFERENTIAL/PLATELET
Basophils Absolute: 0 10*3/uL (ref 0.0–0.1)
Basophils Relative: 0 % (ref 0–1)
Eosinophils Absolute: 0.1 10*3/uL (ref 0.0–0.7)
HCT: 30.4 % — ABNORMAL LOW (ref 39.0–52.0)
Hemoglobin: 10.2 g/dL — ABNORMAL LOW (ref 13.0–17.0)
Lymphs Abs: 1.2 10*3/uL (ref 0.7–4.0)
MCH: 29.1 pg (ref 26.0–34.0)
Monocytes Relative: 5 % (ref 3–12)
Neutro Abs: 8.2 10*3/uL — ABNORMAL HIGH (ref 1.7–7.7)
Neutrophils Relative %: 82 % — ABNORMAL HIGH (ref 43–77)
RBC: 3.51 MIL/uL — ABNORMAL LOW (ref 4.22–5.81)

## 2012-10-24 LAB — URINE MICROSCOPIC-ADD ON

## 2012-10-24 MED ORDER — OXYCODONE-ACETAMINOPHEN 5-325 MG PO TABS: 1.0000 | ORAL_TABLET | ORAL | Status: DC | PRN

## 2012-10-24 MED ORDER — ONDANSETRON 8 MG PO TBDP: 8.0000 mg | ORAL_TABLET | Freq: Three times a day (TID) | ORAL | Status: DC | PRN

## 2012-10-24 MED ORDER — TAMSULOSIN HCL 0.4 MG PO CAPS: 0.4000 mg | ORAL_CAPSULE | Freq: Every day | ORAL | Status: DC

## 2012-10-24 MED ORDER — MORPHINE SULFATE 4 MG/ML IJ SOLN
6.0000 mg | Freq: Once | INTRAMUSCULAR | Status: DC
  Filled 2012-10-24: qty 2

## 2012-10-24 NOTE — ED Provider Notes (Signed)
History     CSN: [id number]  Arrival date & time 10/24/12  4098   First MD Initiated Contact with Patient 10/24/12 1013      Chief Complaint  Patient presents with  . Flank Pain    (Consider location/radiation/quality/duration/timing/severity/associated sxs/prior treatment) The history is provided by the patient.   patient reports awakening with acute left flank pain it radiated to the left side of his abdomen.  No nausea or vomiting but reports this feels like part kidney stone.  He's had kidney stones before in the past.  Never required intervention.  He took hydrocodone at home with minimal relief in his symptoms.  On the car ride to the emergency department he stated some mild improvement in his symptoms.  Arrival the ER his pain is only mild and he does not want any pain medications at this time.  He had lumbar surgery several weeks ago but reports no complications from this.  Weakness in his lower extremities.  Past Medical History  Diagnosis Date  . CAD (coronary artery disease)     previous myocardial infarction in 2001 with 90% circumflex lesion treated with PTCA. The LAD had 70-80% stenosis which was treawted with angioplasty. Most recent catheterization in 2003 demonstrated the LAD at 30% mid stenosis, circumflex 60-70% stenosis in the mid AV grove, and 40% stenosis in the previously stented area. The right coronary artery had 30-40% stenosis in the mid portion.   Marland Kitchen CAD (coronary artery disease)     The EF was 55%. He had PTCA and stenting in the AV circumflex. His last stress perfustion study was in 2006, previous syncope with a negative electrophysiology workup, gastroesopageal flux disease, dyslipidemia ([facility]ll[facility]), arthritis.  . Hypertension   . Stones in the urinary tract   . GERD (gastroesophageal reflux disease)   . Arthritis     Past Surgical History  Procedure Laterality Date  . Appendectomy    . Arthroscopic knee surgery      x2  . Shoulder  surgery    . Cardiac catheterization  01,03    stents  . Lumbar laminectomy/decompression microdiscectomy N/A 09/28/2012    Procedure: L3  -L5 DECOMPRESSION L4 - L5 FUSION WITH INSTRUMENTATION 2 LEVELS;  Surgeon: Venita Lick, MD;  Location: [facility] OR;  Service: Orthopedics;  Laterality: N/A;    Family History  Problem Relation Age of Onset  . Prostate cancer Father     History  Substance Use Topics  . Smoking status: Former Smoker -- 1.00 packs/day for 2 years    Quit date: 09/26/1962  . Smokeless tobacco: Not on file  . Alcohol Use: No      Review of Systems  Genitourinary: Positive for flank pain.  All other systems reviewed and are negative.    Allergies  Review of patient's allergies indicates no known allergies.  Home Medications   Current Outpatient Rx  Name  Route  Sig  Dispense  Refill  . acetaminophen (TYLENOL) 650 MG CR tablet   Oral   Take 650 mg by mouth every 8 (eight) hours as needed for pain.         Marland Kitchen aspirin 81 MG EC tablet   Oral   Take 81 mg by mouth daily.           Marland Kitchen atorvastatin (LIPITOR) 80 MG tablet   Oral   Take 1 tablet (80 mg total) by mouth daily.   90 tablet   3   . docusate sodium (  COLACE) 100 MG capsule   Oral   Take 100 mg by mouth 2 (two) times daily as needed for constipation.         Marland Kitchen esomeprazole (NEXIUM) 40 MG capsule   Oral   Take 40 mg by mouth daily before breakfast.           . folic acid (FOLVITE) 400 MCG tablet   Oral   Take 400 mcg by mouth daily.           Marland Kitchen HYDROcodone-acetaminophen (NORCO) 10-325 MG per tablet   Oral   Take 1 tablet by mouth every 6 (six) hours as needed for pain.   60 tablet   0   . metoprolol tartrate (LOPRESSOR) 25 MG tablet   Oral   Take 1 tablet (25 mg total) by mouth 2 (two) times daily.   180 tablet   3   . niacin (NIASPAN) 1000 MG CR tablet   Oral   Take 1 tablet (1,000 mg total) by mouth at bedtime.   90 tablet   3   . nitroGLYCERIN (NITROSTAT) 0.4 MG SL  tablet   Sublingual   Place 0.4 mg under the tongue every 5 (five) minutes as needed for chest pain.         Marland Kitchen ondansetron (ZOFRAN ODT) 8 MG disintegrating tablet   Oral   Take 1 tablet (8 mg total) by mouth every 8 (eight) hours as needed for nausea.   10 tablet   0   . oxyCODONE-acetaminophen (PERCOCET/ROXICET) 5-325 MG per tablet   Oral   Take 1 tablet by mouth every 4 (four) hours as needed for pain.   20 tablet   0     BP 113/53  Pulse 87  Temp(Src) 97.5 F (36.4 C) (Oral)  Resp 18  SpO2 100%  Physical Exam  Nursing note and vitals reviewed. Constitutional: He is oriented to person, place, and time. He appears well-developed and well-nourished.  HENT:  Head: Normocephalic and atraumatic.  Eyes: EOM are normal.  Neck: Normal range of motion.  Cardiovascular: Normal rate, regular rhythm, normal heart sounds and intact distal pulses.   Pulmonary/Chest: Effort normal and breath sounds normal. No respiratory distress.  Abdominal: Soft. He exhibits no distension. There is no tenderness.  Musculoskeletal: Normal range of motion.  Neurological: He is alert and oriented to person, place, and time.  Skin: Skin is warm and dry.  Psychiatric: He has a normal mood and affect. Judgment normal.    ED Course  Procedures (including critical care time)  Labs Reviewed  CBC WITH DIFFERENTIAL - Abnormal; Notable for the following:    RBC 3.51 (*)    Hemoglobin 10.2 (*)    HCT 30.4 (*)    Neutrophils Relative 82 (*)    Neutro Abs 8.2 (*)    All other components within normal limits  COMPREHENSIVE METABOLIC PANEL - Abnormal; Notable for the following:    Glucose, Bld 135 (*)    Albumin 3.4 (*)    GFR calc non Af Amer 59 (*)    GFR calc Af Amer 68 (*)    All other components within normal limits  URINALYSIS, ROUTINE W REFLEX MICROSCOPIC   Ct Abdomen Pelvis Wo Contrast  10/24/2012  *RADIOLOGY REPORT*  Clinical Data: Left flank pain  CT ABDOMEN AND PELVIS WITHOUT CONTRAST   Technique:  Multidetector CT imaging of the abdomen and pelvis was performed following the standard protocol without intravenous contrast.  Comparison: None.  Findings: Visualized  lung bases appear normal.  These unenhanced images demonstrate no focal abnormality of the liver, spleen or pancreas.  Cholelithiasis is noted without gallbladder wall thickening or pericholecystic fluid.  Bilateral nephrolithiasis is noted.  27 mm cyst is seen arising from the mid pole of the right kidney.  Right ureter appears normal.  Minimal left hydronephrosis is noted secondary to a 11 mm elongated calculus in the proximal left ureter just beyond the ureteropelvic junction.  The appendix is not visualized.  Urinary bladder appears normal.  Mild prostatic enlargement is noted with enlargement of seminal vesicles.  No abnormal fluid collection is noted.  IMPRESSION: Mild nephrolithiasis bilaterally.  Minimal left hydronephrosis secondary to a 11 mm calculus in the proximal left ureter as described above. Mild cholelithiasis.  Mild prostatic enlargement with enlargement of seminal vesicles.   Original Report Authenticated By: Lupita Raider.,  M.D.    I personally reviewed the imaging tests through PACS system I reviewed available ER/hospitalization records through the EMR   1. Ureteral stone       MDM  1:12 PM The patient feels much better at this time.  The patient be discharged home with urology followup.  His pain is controlled.  Strict return precautions were given.  This is a large left ureteral stone 11 mm.  The patient is well-appearing    [phone] wife (urine pending and will be called if abnormal)    Lyanne Co, MD 10/24/12 1314

## 2012-10-24 NOTE — ED Notes (Signed)
Pt reports upon waking around 630 woke up with left flank pain. Pt reports back surgery 3 weeks ago but reports this pain feels similar to a kidney stone pain he's had before. Pt stating, "I need to lie down", while in triage. Pt reports he took one of his back pain pills at 0730, hydrocodone 10-325. States pills were helping but getting out of the car has made his pain worse.

## 2012-10-25 ENCOUNTER — Other Ambulatory Visit: Payer: Self-pay | Admitting: Urology

## 2012-10-25 ENCOUNTER — Encounter (HOSPITAL_COMMUNITY): Payer: Self-pay | Admitting: *Deleted

## 2012-10-27 ENCOUNTER — Encounter (HOSPITAL_COMMUNITY): Payer: Self-pay | Admitting: Pharmacy Technician

## 2012-10-27 NOTE — H&P (Signed)
History of Present Illness                             F/u elevated PSA and BPH. His father and uncle had PCa.   Prior PSA screening and biopsy: --Mar 2002 PSA 4.4, TRUS bx benign, 42 g prostate --Jul 2006 PSA 6.75 / 17% free, TRUS bx b9 with inflammation, 54 g prostate --Nov 2008 PSA 5.26 / 30% free --Aug 2010 PSA 6.46 / 20% free --Mar 2012 PSA 6.20 / 27% free --Dec 2012 PSA 4.69 --May 2013 PSA 5.01, normal DRE  BPH - on surveillance.   He has had kidney stones and passed them on two occasions. He has not needed surgery.    Interval Hx He returns today and was seen in the ER March 25 with left flank pain. A CT scan of the abdomen and pelvis revealed small stones bilaterally but an 11 mm left proximal ureteral stone with mild hydronephrosis. I reviewed all the images.   He was sent home with pain med and tamsulosin. Pain controlled today. No F/C, N/V.   October 24, 2012 labs: BUN 15, creatinine 1.18, white blood cell 10, U/a rare bacteria/7-10 white blood cells/too numerous to count red blood cells   Mar 2014 KUB today - normal bones and bowel gas pattern. Stone in left proximal ureter.    Past Medical History Problems  1. History of  Acute Myocardial Infarction V12.59 2. History of  Arthritis V13.4 3. History of  Cath Stent Placement 4. History of  Esophageal Reflux 530.81 5. History of  Hypercholesterolemia 272.0  Surgical History Problems  1. History of  Appendectomy 2. History of  Appendectomy 3. History of  Heart Surgery  Current Meds 1. Aspirin 81 MG Oral Tablet; Therapy: (Recorded:04Dec2007) to 2. Folic Acid 400 MCG Oral Tablet; Therapy: (Recorded:04Dec2007) to 3. Lipitor 80 MG Oral Tablet; TAKE 1.5 TABLET Daily; Therapy: (Recorded:05Aug2010) to 4. Metoprolol Tartrate 25 MG Oral Tablet; Therapy: 21Feb2013 to 5. NexIUM 40 MG Oral Capsule Delayed Release; Therapy: (Recorded:04Dec2007) to 6. Niaspan 1000 MG Oral Tablet Extended Release; Therapy: 21Feb2013 to 7.  Nitrostat 0.4 MG Sublingual Tablet Sublingual; Therapy: 10Feb2014 to  Allergies Medication  1. No Known Drug Allergies  Family History Problems  1. Maternal history of  Alzheimer's Disease 2. Paternal history of  Death In The Family Father Heart/ProstateAge 3 3. Maternal history of  Death In The Family Mother Alzheimer'sAge 77 4. Family history of  Family Health Status Number Of Children 2 DAUGHTERS 5. Paternal history of  Prostate Cancer V16.42  Social History Problems  1. Caffeine Use 2 PER DAY 2. Marital History - Currently Married 3. Occupation: RETIRED 4. Tobacco Use V15.82 SMOKED 1 PK FOR 3 YRS - QUIT 47 YRS Denied  5. Alcohol Use  Vitals Vital Signs [Data Includes: Last 1 Day]  26Mar2014 10:36AM  Blood Pressure: 123 / 73 Temperature: 97.7 F Heart Rate: 72  Physical Exam Constitutional: Well nourished and well developed . No acute distress.  Pulmonary: No respiratory distress and normal respiratory rhythm and effort.  Cardiovascular: Heart rate and rhythm are normal . No peripheral edema.  Abdomen: No CVA tenderness.  Neuro/Psych:. Mood and affect are appropriate.    Results/Data  Urine [Data Includes: Last 1 Day]   26Mar2014  COLOR YELLOW   APPEARANCE CLEAR   SPECIFIC GRAVITY 1.030   pH 5.5   GLUCOSE NEG mg/dL  BILIRUBIN NEG   KETONE NEG mg/dL  BLOOD MOD   PROTEIN TRACE mg/dL  UROBILINOGEN 0.2 mg/dL  NITRITE NEG   LEUKOCYTE ESTERASE NEG   SQUAMOUS EPITHELIAL/HPF RARE   WBC 0-2 WBC/hpf  RBC 11-20 RBC/hpf  BACTERIA RARE   CRYSTALS NONE SEEN   CASTS NONE SEEN   Other MUCUS    25 Oct 2012 9:59 AM   UA With REFLEX       COLOR YELLOW       APPEARANCE CLEAR       SPECIFIC GRAVITY 1.030       pH 5.5       GLUCOSE NEG       BILIRUBIN NEG       KETONE NEG       BLOOD MOD       PROTEIN TRACE       UROBILINOGEN 0.2       NITRITE NEG       LEUKOCYTE ESTERASE NEG       SQUAMOUS EPITHELIAL/HPF RARE       WBC 0-2       CRYSTALS NONE SEEN        CASTS NONE SEEN       RBC 11-20       BACTERIA RARE       Other MUCUS    Assessment Assessed  1. Ureteral Stone 592.1  Plan  Ureteral Stone (592.1)  1. KUB  Done: 26Mar2014 12:00AM 2. Follow-up Schedule Surgery Office  Follow-up  Done: 26Mar2014  UA With REFLEX  Status: Resulted - Requires Verification  Done: 01Jan0001 12:00AM Ordered Today; For: Health Maintenance (V70.0); Ordered By: Jerilee Field  Due: 28Mar2014 Marked Important; Last Updated By: Thomasenia Sales   Discussion/Summary     We reviewed the KUB findings and the nature, risks and benefits of continued stone passage was also labeled for blocker use, extracorporeal shockwave lithotripsy or endoscopic management with ureteroscopy possible stent or pre-stenting. All questions answered. The patient would like to schedule shockwave lithotripsy.  He was instructed to call the office or seek medical attention if he gets any recurrent uncontrolled pain or develops fevers chills nausea or vomiting.      Signatures Electronically signed by : Jerilee Field, M.D.; Oct 25 2012 11:28AM

## 2012-10-30 ENCOUNTER — Ambulatory Visit (HOSPITAL_COMMUNITY)
Admission: RE | Admit: 2012-10-30 | Discharge: 2012-10-30 | Disposition: A | Discharge status: Home / Self Care | Payer: Medicare Other | Source: Ambulatory Visit | Attending: Urology | Admitting: Urology

## 2012-10-30 ENCOUNTER — Encounter (HOSPITAL_COMMUNITY): Payer: Self-pay | Admitting: General Practice

## 2012-10-30 ENCOUNTER — Ambulatory Visit (HOSPITAL_COMMUNITY): Payer: Medicare Other

## 2012-10-30 ENCOUNTER — Encounter (HOSPITAL_COMMUNITY)
Admission: RE | Disposition: A | Discharge status: Home / Self Care | Payer: Self-pay | Source: Ambulatory Visit | Attending: Urology

## 2012-10-30 DIAGNOSIS — N400 Enlarged prostate without lower urinary tract symptoms: Secondary | ICD-10-CM | POA: Insufficient documentation

## 2012-10-30 DIAGNOSIS — M129 Arthropathy, unspecified: Secondary | ICD-10-CM | POA: Insufficient documentation

## 2012-10-30 DIAGNOSIS — E7800 Pure hypercholesterolemia, unspecified: Secondary | ICD-10-CM | POA: Insufficient documentation

## 2012-10-30 DIAGNOSIS — I252 Old myocardial infarction: Secondary | ICD-10-CM | POA: Insufficient documentation

## 2012-10-30 DIAGNOSIS — Z79899 Other long term (current) drug therapy: Secondary | ICD-10-CM | POA: Insufficient documentation

## 2012-10-30 DIAGNOSIS — Z7982 Long term (current) use of aspirin: Secondary | ICD-10-CM | POA: Insufficient documentation

## 2012-10-30 DIAGNOSIS — Z9861 Coronary angioplasty status: Secondary | ICD-10-CM | POA: Insufficient documentation

## 2012-10-30 DIAGNOSIS — N201 Calculus of ureter: Secondary | ICD-10-CM | POA: Insufficient documentation

## 2012-10-30 DIAGNOSIS — K219 Gastro-esophageal reflux disease without esophagitis: Secondary | ICD-10-CM | POA: Insufficient documentation

## 2012-10-30 DIAGNOSIS — Z87891 Personal history of nicotine dependence: Secondary | ICD-10-CM | POA: Insufficient documentation

## 2012-10-30 HISTORY — PX: EXTRACORPOREAL SHOCK WAVE LITHOTRIPSY: SHX1557

## 2012-10-30 SURGERY — LITHOTRIPSY, ESWL
Anesthesia: LOCAL | Laterality: Left

## 2012-10-30 MED ORDER — SODIUM CHLORIDE 0.9 % IV SOLN
INTRAVENOUS | Status: DC
  Administered 2012-10-30: 13:00:00 via INTRAVENOUS

## 2012-10-30 MED ORDER — CIPROFLOXACIN HCL 250 MG PO TABS: 500.0000 mg | ORAL_TABLET | Freq: Two times a day (BID) | ORAL | Status: DC

## 2012-10-30 MED ORDER — CIPROFLOXACIN HCL 500 MG PO TABS
500.0000 mg | ORAL_TABLET | ORAL | Status: AC
  Administered 2012-10-30: 500 mg via ORAL
  Filled 2012-10-30: qty 1

## 2012-10-30 MED ORDER — DIPHENHYDRAMINE HCL 25 MG PO CAPS
25.0000 mg | ORAL_CAPSULE | ORAL | Status: AC
  Administered 2012-10-30: 25 mg via ORAL
  Filled 2012-10-30: qty 1

## 2012-10-30 MED ORDER — OXYCODONE-ACETAMINOPHEN 5-325 MG PO TABS: 1.0000 | ORAL_TABLET | ORAL | Status: DC | PRN

## 2012-10-30 MED ORDER — DIAZEPAM 5 MG PO TABS
10.0000 mg | ORAL_TABLET | ORAL | Status: AC
  Administered 2012-10-30: 10 mg via ORAL
  Filled 2012-10-30: qty 2

## 2012-10-30 NOTE — Interval H&P Note (Signed)
History and Physical Interval Note:  10/30/2012 2:15 PM  [name]  has presented today for surgery, with the diagnosis of LEFT PROXIMAL URETERAL STONE   The various methods of treatment have been discussed with the patient and family. After consideration of risks, benefits and other options for treatment, the patient has consented to  Procedure(s): LEFT EXTRACORPOREAL SHOCK WAVE LITHOTRIPSY (ESWL) (Left) as a surgical intervention .  The patient's history has been reviewed, patient examined, no change in status, stable for surgery.  I have reviewed the patient's chart and labs.  Questions were answered to the patient's satisfaction.     Antony Haste

## 2012-10-30 NOTE — Brief Op Note (Signed)
10/30/2012  3:06 PM  PATIENT:  [name]  77 y.o. male  PRE-OPERATIVE DIAGNOSIS:  LEFT PROXIMAL URETERAL STONE   POST-OPERATIVE DIAGNOSIS:  * No post-op diagnosis entered *  PROCEDURE:  Procedure(s): LEFT EXTRACORPOREAL SHOCK WAVE LITHOTRIPSY (ESWL) (Left)  SURGEON:  Surgeon(s) and Role:    * Antony Haste, MD - Primary   PLAN OF CARE: Discharge to home after PACU  PATIENT DISPOSITION:  PACU - hemodynamically stable.   Delay start of Pharmacological VTE agent (>24hrs) due to surgical blood loss or risk of bleeding: yes

## 2013-07-05 ENCOUNTER — Encounter: Payer: Self-pay | Admitting: Cardiology

## 2013-09-14 ENCOUNTER — Other Ambulatory Visit: Payer: Self-pay

## 2013-09-14 MED ORDER — NIACIN ER (ANTIHYPERLIPIDEMIC) 1000 MG PO TBCR: 1000.0000 mg | EXTENDED_RELEASE_TABLET | Freq: Every day | ORAL | Status: DC

## 2013-11-26 ENCOUNTER — Encounter: Payer: Self-pay | Admitting: Cardiology

## 2013-11-26 ENCOUNTER — Ambulatory Visit (INDEPENDENT_AMBULATORY_CARE_PROVIDER_SITE_OTHER): Payer: Medicare Other | Admitting: Cardiology

## 2013-11-26 VITALS — BP 141/73 | HR 64 | Ht 70.0 in | Wt 185.0 lb

## 2013-11-26 DIAGNOSIS — E7800 Pure hypercholesterolemia, unspecified: Secondary | ICD-10-CM

## 2013-11-26 DIAGNOSIS — Z79899 Other long term (current) drug therapy: Secondary | ICD-10-CM

## 2013-11-26 DIAGNOSIS — I2510 Atherosclerotic heart disease of native coronary artery without angina pectoris: Secondary | ICD-10-CM

## 2013-11-26 LAB — LIPID PANEL
CHOL/HDL RATIO: 2
Cholesterol: 115 mg/dL (ref 0–200)
HDL: 49.7 mg/dL (ref >39.00)
LDL Cholesterol: 56 mg/dL (ref 0–99)
Triglycerides: 48 mg/dL (ref 0.0–149.0)
VLDL: 9.6 mg/dL (ref 0.0–40.0)

## 2013-11-26 LAB — HEPATIC FUNCTION PANEL
ALBUMIN: 3.7 g/dL (ref 3.5–5.2)
ALT: 19 U/L (ref 0–53)
AST: 23 U/L (ref 0–37)
Alkaline Phosphatase: 51 U/L (ref 39–117)
BILIRUBIN TOTAL: 0.6 mg/dL (ref 0.3–1.2)
Bilirubin, Direct: 0.1 mg/dL (ref 0.0–0.3)
Total Protein: 6.6 g/dL (ref 6.0–8.3)

## 2013-11-26 MED ORDER — METOPROLOL TARTRATE 25 MG PO TABS: 25.0000 mg | ORAL_TABLET | Freq: Two times a day (BID) | ORAL | Status: DC

## 2013-11-26 MED ORDER — ATORVASTATIN CALCIUM 80 MG PO TABS: 80.0000 mg | ORAL_TABLET | Freq: Every day | ORAL | Status: AC

## 2013-11-26 NOTE — Progress Notes (Signed)
HPI The patient presents for one-year followup. Since I last saw her he has done well from a cardiovascular standpoint.  The patient denies any new symptoms such as chest discomfort, neck or arm discomfort. There has been no new shortness of breath, PND or orthopnea. There have been no reported palpitations, presyncope or syncope. 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  He did have back surgery with some improvement in symptoms.  However, he has a Baker's cyst and is somewhat limited by this.    No Known Allergies  Current Outpatient Prescriptions  Medication Sig Dispense Refill  . aspirin 81 MG EC tablet Take 81 mg by mouth daily.        Marland Kitchen atorvastatin (LIPITOR) 80 MG tablet Take 80 mg by mouth at bedtime.      Marland Kitchen esomeprazole (NEXIUM) 40 MG capsule Take 40 mg by mouth daily before breakfast.        . folic acid (FOLVITE) 915 MCG tablet Take 400 mcg by mouth daily.        . metoprolol tartrate (LOPRESSOR) 25 MG tablet Take 25 mg by mouth 2 (two) times daily.      . niacin (NIASPAN) 1000 MG CR tablet Take 1 tablet (1,000 mg total) by mouth at bedtime.  90 tablet  0  . nitroGLYCERIN (NITROSTAT) 0.4 MG SL tablet Place 0.4 mg under the tongue every 5 (five) minutes as needed for chest pain.       No current facility-administered medications for this visit.    Past Medical History  Diagnosis Date  . Hypertension   . Stones in the urinary tract   . GERD (gastroesophageal reflux disease)   . CAD (coronary artery disease)     previous myocardial infarction in 2001 with 90% circumflex lesion treated with PTCA. The LAD had 70-80% stenosis which was treawted with angioplasty. Most recent catheterization in 2003 demonstrated the LAD at 30% mid stenosis, circumflex 60-70% stenosis in the mid AV grove, and 40% stenosis in the previously stented area. The right coronary artery had 30-40% stenosis in the mid portion.   Marland Kitchen CAD (coronary artery disease)     The EF was 55%. He had PTCA and  stenting in the AV circumflex. His last stress perfustion study was in 2006, previous syncope with a negative electrophysiology workup, gastroesopageal flux disease, dyslipidemia ([facility]llowed in [facility]), arthritis.  . Chronic kidney disease     kidney stone  . Arthritis     knees,back    Past Surgical History  Procedure Laterality Date  . Appendectomy    . Arthroscopic knee surgery      x2  . Shoulder surgery    . Cardiac catheterization  01,03    stents  . Lumbar laminectomy/decompression microdiscectomy N/A 09/28/2012    Procedure: L3  -L5 DECOMPRESSION L4 - L5 FUSION WITH INSTRUMENTATION 2 LEVELS;  Surgeon: Melina Schools, MD;  Location: [place];  Service: Orthopedics;  Laterality: N/A;   ROS:  As stated in the HPI and negative for all other systems.  PHYSICAL EXAM BP 141/73  Pulse 64  Ht 5\' 10"  (1.778 m)  Wt 185 lb (83.915 kg)  BMI 26.54 kg/m2 GENERAL:  Well appearing NECK:  No jugular venous distention, waveform within normal limits, carotid upstroke brisk and symmetric, no bruits, no thyromegaly LUNGS:  Clear to auscultation bilaterally BACK:  No CVA tenderness CHEST:  Unremarkable HEART:  PMI not displaced or sustained,S1 and S2 within normal limits, no  S3, no S4, no clicks, no rubs, no murmurs ABD:  Flat, positive bowel sounds normal in frequency in pitch, no bruits, no rebound, no guarding, no midline pulsatile mass, no hepatomegaly, no splenomegaly EXT:  2 plus pulses throughout, no edema, no cyanosis no clubbing SKIN:  No rashes no nodules  EKG:  Sinus rhythm, rate 64, axis within normal limits, intervals within normal limits, no acute ST-T wave changes.  11/26/2013   ASSESSMENT AND PLAN   CAD He has no new symptoms since POET (Plain Old Exercise Treadmill) in 2013.  No change in therapy is indicated.  He will continue with risk reduction.  HYPERTENSION The blood pressure is at target. No change in medications is indicated. We will continue with therapeutic  lifestyle changes (TLC).  DYSLIPIDEMIA I will check a lipid profile.

## 2013-11-26 NOTE — Patient Instructions (Signed)
The current medical regimen is effective;  continue present plan and medications.  Please have fasting lipid and hepatic panels today.  Follow up in 1 year with Dr Percival Spanish.  You will receive a letter in the mail 2 months before you are due.  Please call us when you receive this letter to schedule your follow up appointment.

## 2014-07-17 ENCOUNTER — Other Ambulatory Visit (HOSPITAL_COMMUNITY): Payer: Self-pay | Admitting: Specialist

## 2014-07-17 ENCOUNTER — Ambulatory Visit (HOSPITAL_COMMUNITY)
Admission: RE | Admit: 2014-07-17 | Discharge: 2014-07-17 | Disposition: A | Discharge status: Home / Self Care | Payer: Medicare Other | Source: Ambulatory Visit | Attending: Internal Medicine | Admitting: Internal Medicine

## 2014-07-17 DIAGNOSIS — M1712 Unilateral primary osteoarthritis, left knee: Secondary | ICD-10-CM | POA: Diagnosis present

## 2014-07-17 DIAGNOSIS — M79605 Pain in left leg: Secondary | ICD-10-CM

## 2014-07-17 NOTE — Progress Notes (Signed)
Left Lower Extremity Venous Duplex Completed. No evidence for DVT or SVT. °Brianna L Mazza,RVT °

## 2014-07-29 ENCOUNTER — Telehealth (HOSPITAL_COMMUNITY): Payer: Self-pay | Admitting: *Deleted

## 2015-01-27 ENCOUNTER — Other Ambulatory Visit: Payer: Self-pay

## 2015-04-01 ENCOUNTER — Encounter (HOSPITAL_COMMUNITY): Payer: Self-pay | Admitting: Emergency Medicine

## 2015-04-01 ENCOUNTER — Emergency Department (HOSPITAL_COMMUNITY)
Admission: EM | Admit: 2015-04-01 | Discharge: 2015-04-02 | Disposition: A | Discharge status: Home / Self Care | Payer: Medicare Other | Attending: Emergency Medicine | Admitting: Emergency Medicine

## 2015-04-01 DIAGNOSIS — Z7982 Long term (current) use of aspirin: Secondary | ICD-10-CM | POA: Diagnosis not present

## 2015-04-01 DIAGNOSIS — K219 Gastro-esophageal reflux disease without esophagitis: Secondary | ICD-10-CM | POA: Insufficient documentation

## 2015-04-01 DIAGNOSIS — I2510 Atherosclerotic heart disease of native coronary artery without angina pectoris: Secondary | ICD-10-CM | POA: Diagnosis not present

## 2015-04-01 DIAGNOSIS — R0781 Pleurodynia: Secondary | ICD-10-CM | POA: Diagnosis not present

## 2015-04-01 DIAGNOSIS — I129 Hypertensive chronic kidney disease with stage 1 through stage 4 chronic kidney disease, or unspecified chronic kidney disease: Secondary | ICD-10-CM | POA: Insufficient documentation

## 2015-04-01 DIAGNOSIS — Z9889 Other specified postprocedural states: Secondary | ICD-10-CM | POA: Diagnosis not present

## 2015-04-01 DIAGNOSIS — Z87891 Personal history of nicotine dependence: Secondary | ICD-10-CM | POA: Diagnosis not present

## 2015-04-01 DIAGNOSIS — Z87442 Personal history of urinary calculi: Secondary | ICD-10-CM | POA: Insufficient documentation

## 2015-04-01 DIAGNOSIS — M1990 Unspecified osteoarthritis, unspecified site: Secondary | ICD-10-CM | POA: Diagnosis not present

## 2015-04-01 DIAGNOSIS — Z79899 Other long term (current) drug therapy: Secondary | ICD-10-CM | POA: Insufficient documentation

## 2015-04-01 DIAGNOSIS — Z9049 Acquired absence of other specified parts of digestive tract: Secondary | ICD-10-CM | POA: Insufficient documentation

## 2015-04-01 DIAGNOSIS — N23 Unspecified renal colic: Secondary | ICD-10-CM | POA: Insufficient documentation

## 2015-04-01 DIAGNOSIS — I509 Heart failure, unspecified: Secondary | ICD-10-CM | POA: Insufficient documentation

## 2015-04-01 DIAGNOSIS — N189 Chronic kidney disease, unspecified: Secondary | ICD-10-CM | POA: Diagnosis not present

## 2015-04-01 DIAGNOSIS — R1012 Left upper quadrant pain: Secondary | ICD-10-CM | POA: Diagnosis present

## 2015-04-01 LAB — CBC
HCT: 38.1 % — ABNORMAL LOW (ref 39.0–52.0)
HEMOGLOBIN: 12.2 g/dL — AB (ref 13.0–17.0)
MCH: 28.5 pg (ref 26.0–34.0)
MCHC: 32 g/dL (ref 30.0–36.0)
MCV: 89 fL (ref 78.0–100.0)
PLATELETS: 176 10*3/uL (ref 150–400)
RBC: 4.28 MIL/uL (ref 4.22–5.81)
RDW: 15.5 % (ref 11.5–15.5)
WBC: 7 10*3/uL (ref 4.0–10.5)

## 2015-04-01 LAB — URINALYSIS, ROUTINE W REFLEX MICROSCOPIC
Bilirubin Urine: NEGATIVE
Glucose, UA: NEGATIVE mg/dL
Hgb urine dipstick: NEGATIVE
KETONES UR: NEGATIVE mg/dL
LEUKOCYTES UA: NEGATIVE
NITRITE: NEGATIVE
PH: 5.5 (ref 5.0–8.0)
PROTEIN: NEGATIVE mg/dL
Specific Gravity, Urine: 1.024 (ref 1.005–1.030)
Urobilinogen, UA: 0.2 mg/dL (ref 0.0–1.0)

## 2015-04-01 LAB — COMPREHENSIVE METABOLIC PANEL
ALT: 20 U/L (ref 17–63)
ANION GAP: 9 (ref 5–15)
AST: 30 U/L (ref 15–41)
Albumin: 3.9 g/dL (ref 3.5–5.0)
Alkaline Phosphatase: 61 U/L (ref 38–126)
BUN: 19 mg/dL (ref 6–20)
CHLORIDE: 109 mmol/L (ref 101–111)
CO2: 22 mmol/L (ref 22–32)
CREATININE: 1.46 mg/dL — AB (ref 0.61–1.24)
Calcium: 9.3 mg/dL (ref 8.9–10.3)
GFR, EST AFRICAN AMERICAN: 52 mL/min — AB (ref >60)
GFR, EST NON AFRICAN AMERICAN: 45 mL/min — AB (ref >60)
Glucose, Bld: 129 mg/dL — ABNORMAL HIGH (ref 65–99)
Potassium: 3.8 mmol/L (ref 3.5–5.1)
SODIUM: 140 mmol/L (ref 135–145)
Total Bilirubin: 1.2 mg/dL (ref 0.3–1.2)
Total Protein: 6.9 g/dL (ref 6.5–8.1)

## 2015-04-01 LAB — I-STAT TROPONIN, ED: Troponin i, poc: 0 ng/mL (ref 0.00–0.08)

## 2015-04-01 LAB — LIPASE, BLOOD: LIPASE: 24 U/L (ref 22–51)

## 2015-04-01 MED ORDER — SODIUM CHLORIDE 0.9 % IV BOLUS (SEPSIS)
500.0000 mL | Freq: Once | INTRAVENOUS | Status: AC
  Administered 2015-04-02: 500 mL via INTRAVENOUS

## 2015-04-01 MED ORDER — IOHEXOL 300 MG/ML  SOLN
25.0000 mL | Freq: Once | INTRAMUSCULAR | Status: AC | PRN
  Administered 2015-04-02: 25 mL via ORAL

## 2015-04-01 NOTE — ED Notes (Signed)
Pt. reports LUQ / left lower ribcage pain onset today , denies nausea or vomitting , respirations unlabored .

## 2015-04-01 NOTE — ED Provider Notes (Signed)
CSN: [id number]     Arrival date & time 04/01/15  2002 History  This chart was scribed for Julianne Rice, MD by Eustaquio Maize, ED Scribe. This patient was seen in room D30C/D30C and the patient's care was started at 11:47 PM.  Chief Complaint  Patient presents with  . Abdominal Pain   The history is provided by the patient. No language interpreter was used.     HPI Comments: [name] is a 79 y.o. male who presents to the Emergency Department complaining of sudden onset, constant, throbbing, left upper abdominal pain that occurred this morning. Pt has not taken anything for the pain. He has never had pain like this in the past. His last bowel movement was earlier today which he mentions was smaller than normal. Pt has eaten since onset. Denies nausea, vomiting, fever, chills, pain or swelling in lower extremities, dysuria, hematuria, rash,or any other associated symptoms. Denies chest pain or shortness of breath.   Past Medical History  Diagnosis Date  . Hypertension   . Stones in the urinary tract   . GERD (gastroesophageal reflux disease)   . CAD (coronary artery disease)     previous myocardial infarction in 2001 with 90% circumflex lesion treated with PTCA. The LAD had 70-80% stenosis which was treawted with angioplasty. Most recent catheterization in 2003 demonstrated the LAD at 30% mid stenosis, circumflex 60-70% stenosis in the mid AV grove, and 40% stenosis in the previously stented area. The right coronary artery had 30-40% stenosis in the mid portion.   Marland Kitchen CAD (coronary artery disease)     The EF was 55%. He had PTCA and stenting in the AV circumflex. His last stress perfustion study was in 2006, previous syncope with a negative electrophysiology workup, gastroesopageal flux disease, dyslipidemia (fllowed in [facility]), arthritis.  . Chronic kidney disease     kidney stone  . Arthritis     knees,back  . CHF (congestive heart failure)    Past Surgical History   Procedure Laterality Date  . Appendectomy    . Arthroscopic knee surgery      x2  . Shoulder surgery    . Cardiac catheterization  01,03    stents  . Lumbar laminectomy/decompression microdiscectomy N/A 09/28/2012    Procedure: L3  -L5 DECOMPRESSION L4 - L5 FUSION WITH INSTRUMENTATION 2 LEVELS;  Surgeon: Melina Schools, MD;  Location: [facility];  Service: Orthopedics;  Laterality: N/A;   Family History  Problem Relation Age of Onset  . Prostate cancer Father    Social History  Substance Use Topics  . Smoking status: Former Smoker -- 1.00 packs/day for 2 years    Quit date: 09/26/1962  . Smokeless tobacco: Never Used  . Alcohol Use: No    Review of Systems  Constitutional: Negative for fever and chills.  Respiratory: Negative for shortness of breath.   Cardiovascular: Negative for chest pain and leg swelling.       Left rib pain  Gastrointestinal: Positive for abdominal pain. Negative for nausea, vomiting, diarrhea and constipation.  Genitourinary: Negative for dysuria, hematuria and flank pain.  Musculoskeletal: Negative for myalgias, back pain, neck pain and neck stiffness.  Skin: Negative for rash and wound.  Neurological: Negative for dizziness, weakness, light-headedness, numbness and headaches.  All other systems reviewed and are negative.  Allergies  Review of patient's allergies indicates no known allergies.  Home Medications   Prior to Admission medications   Medication Sig Start Date End Date Taking? Authorizing Provider  aspirin 81 MG EC tablet Take 81 mg by mouth daily.     Yes Historical Provider, MD  atorvastatin (LIPITOR) 80 MG tablet Take 1 tablet (80 mg total) by mouth at bedtime. 11/26/13  Yes Minus Breeding, MD  esomeprazole (NEXIUM) 40 MG capsule Take 40 mg by mouth daily before breakfast.     Yes Historical Provider, MD  folic acid (FOLVITE) 088 MCG tablet Take 400 mcg by mouth daily.     Yes Historical Provider, MD  metoprolol tartrate (LOPRESSOR) 25 MG  tablet Take 1 tablet (25 mg total) by mouth 2 (two) times daily. 11/26/13  Yes Minus Breeding, MD  niacin (NIASPAN) 1000 MG CR tablet Take 1 tablet (1,000 mg total) by mouth at bedtime. 09/14/13  Yes Minus Breeding, MD  nitroGLYCERIN (NITROSTAT) 0.4 MG SL tablet Place 0.4 mg under the tongue every 5 (five) minutes as needed for chest pain.   Yes Historical Provider, MD  oxyCODONE-acetaminophen (PERCOCET) 5-325 MG per tablet Take 1-2 tablets by mouth every 4 (four) hours as needed for moderate pain or severe pain. 04/02/15   Julianne Rice, MD  tamsulosin (FLOMAX) 0.4 MG CAPS capsule Take 1 capsule (0.4 mg total) by mouth daily. 04/02/15   Julianne Rice, MD   Triage Vitals: BP 126/93 mmHg  Pulse 69  Temp(Src) 98.1 F (36.7 C) (Oral)  Resp 16  Ht 5\' 9"  (1.753 m)  Wt 182 lb (82.555 kg)  BMI 26.86 kg/m2  SpO2 100%   Physical Exam  Constitutional: He is oriented to person, place, and time. He appears well-developed and well-nourished. No distress.  HENT:  Head: Normocephalic and atraumatic.  Mouth/Throat: Oropharynx is clear and moist.  Eyes: EOM are normal. Pupils are equal, round, and reactive to light.  Neck: Normal range of motion. Neck supple.  Cardiovascular: Normal rate and regular rhythm.   Pulmonary/Chest: Effort normal and breath sounds normal. No respiratory distress. He has no wheezes. He has no rales.  Abdominal: Soft. Bowel sounds are normal. He exhibits no distension and no mass. There is no tenderness. There is no rebound and no guarding.  Musculoskeletal: Normal range of motion. He exhibits no edema or tenderness.  No CVA tenderness. No midline thoracic or lumbar tenderness. Distal pulses intact. No calf swelling or tenderness.  Neurological: He is alert and oriented to person, place, and time.  Moves all extremities without deficit. Sensation is fully intact.  Skin: Skin is warm and dry. No rash noted. No erythema.  Psychiatric: He has a normal mood and affect. His behavior  is normal.  Nursing note and vitals reviewed.   ED Course  Procedures (including critical care time)  DIAGNOSTIC STUDIES: Oxygen Saturation is 100% on RA, normal by my interpretation.    COORDINATION OF CARE: 12:02 AM-Discussed treatment plan which includes CT A/P with pt at bedside and pt agreed to plan.   Labs Review Labs Reviewed  COMPREHENSIVE METABOLIC PANEL - Abnormal; Notable for the following:    Glucose, Bld 129 (*)    Creatinine, Ser 1.46 (*)    GFR calc non Af Amer 45 (*)    GFR calc Af Amer 52 (*)    All other components within normal limits  CBC - Abnormal; Notable for the following:    Hemoglobin 12.2 (*)    HCT 38.1 (*)    All other components within normal limits  LIPASE, BLOOD  URINALYSIS, ROUTINE W REFLEX MICROSCOPIC (NOT AT [facility])  Randolm Idol, ED    Imaging Review Ct Abdomen  Pelvis W Contrast  04/02/2015   CLINICAL DATA:  Left upper quadrant pain, onset today.  EXAM: CT ABDOMEN AND PELVIS WITH CONTRAST  TECHNIQUE: Multidetector CT imaging of the abdomen and pelvis was performed using the standard protocol following bolus administration of intravenous contrast.  CONTRAST:  115mL OMNIPAQUE IOHEXOL 300 MG/ML  SOLN  COMPARISON:  10/24/2012  FINDINGS: Lower chest: There are noncalcified 5 mm nodules in the posterolateral periphery of the left lower lobe. These are indeterminate but more likely benign.  Hepatobiliary: Multiple calculi are present in the gallbladder lumen. There is no bile duct dilatation. The liver is unremarkable.  Pancreas: Negative for significant abnormality. Mild fatty replacement.  Spleen: Normal  Adrenals/Urinary Tract: Both adrenals are normal. There is a 4.4 cm lower pole right renal cyst. There are collecting system calculi bilaterally, with several 2-3 mm calculi in the right collecting system and with multiple left collecting system calculi measuring up to 5 mm. There is no hydronephrosis. There are at least 2 distal left ureteral  calculi, the largest measuring 5 mm. These are at the ureterovesical junction. These are probably chronic, as there is no significant periureteral stranding, or hydronephrosis.  Stomach/Bowel: There is a small hiatal hernia. Stomach and small bowel are otherwise unremarkable in appearance. There is appendectomy. The colon is unremarkable.  Vascular/Lymphatic: The abdominal aorta is normal in caliber. There is mild atherosclerotic calcification. There is no adenopathy in the abdomen or pelvis.  Reproductive: Unremarkable  Other: No acute inflammatory changes are evident in the abdomen or pelvis. There is no ascites.  Musculoskeletal: There is posterior decompression with instrumented fusion at L4-5. There is a grade 1 L4-5 spondylolisthesis. No significant skeletal lesion is evident. There is a small fat containing umbilical hernia.  IMPRESSION: 1. Two distal left ureteral calculi measuring up to 5 mm, located at the ureterovesical junction. These are probably chronic as there is no acute periureteral stranding or hydronephrosis. 2. Bilateral nephrolithiasis 3. Cholelithiasis 4. Small hiatal hernia 5. Small fat containing umbilical hernia 6. Noncalcified left lower lobe pulmonary nodules, more likely benign. Conservative follow-up recommended with chest CT in 6-12 months depending on level of clinical suspicion.   Electronically Signed   By: Andreas Newport M.D.   On: 04/02/2015 01:47   I have personally reviewed and evaluated these images and lab results as part of my medical decision-making.   EKG Interpretation None      MDM   Final diagnoses:  Renal colic on left side    I personally performed the services described in this documentation, which was scribed in my presence. The recorded information has been reviewed and is accurate.  CT with 2 left-sided ureteral calculi. Patient's pain is improved. We'll discharge home to follow-up with urologist. Return precautions given.    Julianne Rice, MD 04/02/15 [phone]

## 2015-04-02 ENCOUNTER — Encounter (HOSPITAL_COMMUNITY): Payer: Self-pay

## 2015-04-02 ENCOUNTER — Emergency Department (HOSPITAL_COMMUNITY): Payer: Medicare Other

## 2015-04-02 MED ORDER — OXYCODONE-ACETAMINOPHEN 5-325 MG PO TABS: 1.0000 | ORAL_TABLET | ORAL | Status: DC | PRN

## 2015-04-02 MED ORDER — IOHEXOL 300 MG/ML  SOLN
80.0000 mL | Freq: Once | INTRAMUSCULAR | Status: AC | PRN
  Administered 2015-04-02: 100 mL via INTRAVENOUS

## 2015-04-02 MED ORDER — TAMSULOSIN HCL 0.4 MG PO CAPS: 0.4000 mg | ORAL_CAPSULE | Freq: Every day | ORAL | Status: DC

## 2015-04-02 MED ORDER — HYDROCODONE-ACETAMINOPHEN 5-325 MG PO TABS
1.0000 | ORAL_TABLET | Freq: Once | ORAL | Status: AC
  Administered 2015-04-02: 1 via ORAL
  Filled 2015-04-02: qty 1

## 2015-04-02 NOTE — Discharge Instructions (Signed)

## 2015-04-28 ENCOUNTER — Other Ambulatory Visit: Payer: Self-pay | Admitting: Urology

## 2015-05-08 ENCOUNTER — Encounter: Payer: Self-pay | Admitting: Physician Assistant

## 2015-05-08 ENCOUNTER — Ambulatory Visit (INDEPENDENT_AMBULATORY_CARE_PROVIDER_SITE_OTHER): Payer: Medicare Other | Admitting: Physician Assistant

## 2015-05-08 VITALS — BP 104/62 | HR 64 | Ht 69.5 in | Wt 182.1 lb

## 2015-05-08 DIAGNOSIS — I2510 Atherosclerotic heart disease of native coronary artery without angina pectoris: Secondary | ICD-10-CM | POA: Diagnosis not present

## 2015-05-08 DIAGNOSIS — R011 Cardiac murmur, unspecified: Secondary | ICD-10-CM | POA: Diagnosis not present

## 2015-05-08 DIAGNOSIS — Z01818 Encounter for other preprocedural examination: Secondary | ICD-10-CM

## 2015-05-08 DIAGNOSIS — I2583 Coronary atherosclerosis due to lipid rich plaque: Secondary | ICD-10-CM

## 2015-05-08 DIAGNOSIS — E7800 Pure hypercholesterolemia, unspecified: Secondary | ICD-10-CM | POA: Diagnosis not present

## 2015-05-08 NOTE — Progress Notes (Signed)
Patient ID: [name], male   DOB: [birth year], 79 y.o.   MRN: [id number]    Date:  05/08/2015   ID:  [name], DOB 12-Nov-1935, MRN [id number]  PCP:  Shirline Frees, MD  Primary Cardiologist:  [facility]  Chief Complaint  Patient presents with  . Follow-up    clearance for uriteroscopy//pt states no Sx.     History of Present Illness: [name] is a 79 y.o. male with a history of hypertension, coronary artery disease, chronic kidney disease, stones, arthritis.  Cardiac history includesprevious myocardial infarction in 2001 with 90% circumflex lesion treated with PTCA. The LAD had 70-80% stenosis which was treawted with angioplasty. Most recent catheterization in 2003 demonstrated the LAD at 30% mid stenosis, circumflex 60-70% stenosis in the mid AV grove, and 40% stenosis in the previously stented area. The right coronary artery had 30-40% stenosis in the mid portion.   Patient presents for preoperative clearance. He may need to have a ureter stent placed to his kidney stone. He is also undergoing cataract surgery next week. He's had some issues with his left knee and some indigestion which typically occurs after a meal that he's been doing very well. Has had a colonoscopy within the last year which was unremarkable. He has 20 have catalytic 6 Hoyle Sauer a daily basis.  He currently denies nausea, vomiting, fever, chest pain, shortness of breath, orthopnea, dizziness, PND, cough, congestion, abdominal pain, hematochezia, melena, lower extremity edema, claudication.  Wt Readings from Last 3 Encounters:  05/08/15 82.6 kg (182 lb 1.6 oz)  04/01/15 82.555 kg (182 lb)  11/26/13 83.915 kg (185 lb)     Past Medical History  Diagnosis Date  . Hypertension   . Stones in the urinary tract   . GERD (gastroesophageal reflux disease)   . CAD (coronary artery disease)     previous myocardial infarction in 2001 with 90% circumflex lesion treated with PTCA. The LAD had 70-80% stenosis which  was treawted with angioplasty. Most recent catheterization in 2003 demonstrated the LAD at 30% mid stenosis, circumflex 60-70% stenosis in the mid AV grove, and 40% stenosis in the previously stented area. The right coronary artery had 30-40% stenosis in the mid portion.   Marland Kitchen CAD (coronary artery disease)     The EF was 55%. He had PTCA and stenting in the AV circumflex. His last stress perfustion study was in 2006, previous syncope with a negative electrophysiology workup, gastroesopageal flux disease, dyslipidemia (fllowed in [facility]), arthritis.  . Chronic kidney disease     kidney stone  . Arthritis     knees,back  . CHF (congestive heart failure) ([place])     Current Outpatient Prescriptions  Medication Sig Dispense Refill  . aspirin 81 MG EC tablet Take 81 mg by mouth daily.      Marland Kitchen atorvastatin (LIPITOR) 80 MG tablet Take 1 tablet (80 mg total) by mouth at bedtime. 90 tablet 3  . esomeprazole (NEXIUM) 40 MG capsule Take 40 mg by mouth daily before breakfast.      . folic acid (FOLVITE) 347 MCG tablet Take 400 mcg by mouth daily.      . metoprolol tartrate (LOPRESSOR) 25 MG tablet Take 1 tablet (25 mg total) by mouth 2 (two) times daily. 180 tablet 3  . niacin (NIASPAN) 1000 MG CR tablet Take 1 tablet (1,000 mg total) by mouth at bedtime. 90 tablet 0  . nitroGLYCERIN (NITROSTAT) 0.4 MG SL tablet Place 0.4 mg under the tongue  every 5 (five) minutes as needed for chest pain.    Marland Kitchen oxyCODONE-acetaminophen (PERCOCET) 5-325 MG per tablet Take 1-2 tablets by mouth every 4 (four) hours as needed for moderate pain or severe pain. 20 tablet 0  . tamsulosin (FLOMAX) 0.4 MG CAPS capsule Take 1 capsule (0.4 mg total) by mouth daily. 10 capsule 0   No current facility-administered medications for this visit.    Allergies:   No Known Allergies  Social History:  The patient  reports that he quit smoking about 52 years ago. He has never used smokeless tobacco. He reports that he does not drink  alcohol or use illicit drugs.   Family history:   Family History  Problem Relation Age of Onset  . Prostate cancer Father     ROS:  Please see the history of present illness.  All other systems reviewed and negative.   PHYSICAL EXAM: VS:  BP 104/62 mmHg  Pulse 64  Ht 5' 9.5" (1.765 m)  Wt 82.6 kg (182 lb 1.6 oz)  BMI 26.51 kg/m2 Well nourished, well developed, in no acute distress HEENT: Pupils are equal round react to light accommodation extraocular movements are intact.  Neck: no JVDNo cervical lymphadenopathy. Cardiac: Regular rate and rhythm with 1/6 murmur in the LSB and axillae Lungs:  clear to auscultation bilaterally, no wheezing, rhonchi or rales Abd: soft, nontender, positive bowel sounds all quadrants, no hepatosplenomegaly Ext: no lower extremity edema.  2+ radial and dorsalis pedis pulses. Skin: warm and dry Neuro:  Grossly normal  EKG-04/02/2015:  Sinus rhythm T-wave inversion in lead 2 with a prominent Q wave. As his new compared to previous  ASSESSMENT AND PLAN:  Problem List Items Addressed This Visit    Preoperative clearance   Newly recognized murmur   HYPERCHOLESTEROLEMIA-PURE   Coronary atherosclerosis    Other Visit Diagnoses    Heart murmur    -  Primary    Relevant Orders    ECHOCARDIOGRAM COMPLETE    Coronary artery disease involving native coronary artery of native heart without angina pectoris        Relevant Orders    Myocardial Perfusion Imaging    Pre-operative clearance        Relevant Orders    Myocardial Perfusion Imaging       Preoperative clearance: Patient has known coronary disease his last nuclear stress test was around 2008 and cardiac cath before that. He does not have any current symptoms however, given his history will undergo Lexiscan Cardiolite prior to surgery.  Blood pressure well-controlled.  Heart murmur, new We'll order a 2-D echocardiogram to evaluate new heart murmur.  Hyperlipidemia:  On a statin,  Niacin  CAD:  Continue aspirin, beta blocker

## 2015-05-08 NOTE — Patient Instructions (Signed)
Your physician has requested that you have an echocardiogram. Echocardiography is a painless test that uses sound waves to create images of your heart. It provides your doctor with information about the size and shape of your heart and how well your heart's chambers and valves are working. This procedure takes approximately one hour. There are no restrictions for this procedure.  Your physician has requested that you have a lexiscan myoview. For further information please visit [email or web address]. Please follow instruction sheet, as given.  Your physician recommends that you schedule a follow-up appointment in: 6 months with Dr. Percival Spanish

## 2015-05-09 ENCOUNTER — Ambulatory Visit (HOSPITAL_COMMUNITY)
Admission: RE | Admit: 2015-05-09 | Discharge: 2015-05-09 | Disposition: A | Discharge status: Home / Self Care | Payer: Medicare Other | Source: Ambulatory Visit | Attending: Cardiovascular Disease | Admitting: Cardiovascular Disease

## 2015-05-09 DIAGNOSIS — I2510 Atherosclerotic heart disease of native coronary artery without angina pectoris: Secondary | ICD-10-CM | POA: Insufficient documentation

## 2015-05-09 DIAGNOSIS — I10 Essential (primary) hypertension: Secondary | ICD-10-CM | POA: Insufficient documentation

## 2015-05-09 DIAGNOSIS — Z01818 Encounter for other preprocedural examination: Secondary | ICD-10-CM | POA: Diagnosis not present

## 2015-05-09 DIAGNOSIS — Z87891 Personal history of nicotine dependence: Secondary | ICD-10-CM | POA: Diagnosis not present

## 2015-05-09 LAB — MYOCARDIAL PERFUSION IMAGING
CHL CUP NUCLEAR SSS: 3
CHL CUP RESTING HR STRESS: 60 {beats}/min
CHL CUP STRESS STAGE 1 DBP: 59 mmHg
CHL CUP STRESS STAGE 1 SPEED: 0 mph
CHL CUP STRESS STAGE 2 GRADE: 0 %
CHL CUP STRESS STAGE 3 DBP: 75 mmHg
CHL CUP STRESS STAGE 3 GRADE: 0 %
CHL CUP STRESS STAGE 3 HR: 78 {beats}/min
CHL CUP STRESS STAGE 4 DBP: 68 mmHg
CHL CUP STRESS STAGE 4 GRADE: 0 %
CHL CUP STRESS STAGE 4 SBP: 129 mmHg
CHL CUP STRESS STAGE 4 SPEED: 0 mph
Estimated workload: 1 METS
LV dias vol: 88 mL
LVSYSVOL: 33 mL
NUC STRESS TID: 1.01
Peak BP: 128 mmHg
Peak HR: 78 {beats}/min
Percent of predicted max HR: 54 %
SDS: 1
SRS: 2
Stage 1 Grade: 0 %
Stage 1 HR: 57 {beats}/min
Stage 1 SBP: 119 mmHg
Stage 2 HR: 57 {beats}/min
Stage 2 Speed: 0 mph
Stage 3 SBP: 128 mmHg
Stage 3 Speed: 0 mph
Stage 4 HR: 68 {beats}/min

## 2015-05-09 MED ORDER — TECHNETIUM TC 99M SESTAMIBI GENERIC - CARDIOLITE
31.8000 | Freq: Once | INTRAVENOUS | Status: AC | PRN
  Administered 2015-05-09: 31.8 via INTRAVENOUS

## 2015-05-09 MED ORDER — TECHNETIUM TC 99M SESTAMIBI GENERIC - CARDIOLITE
10.9000 | Freq: Once | INTRAVENOUS | Status: AC | PRN
  Administered 2015-05-09: 10.9 via INTRAVENOUS

## 2015-05-09 MED ORDER — REGADENOSON 0.4 MG/5ML IV SOLN
0.4000 mg | Freq: Once | INTRAVENOUS | Status: AC
  Administered 2015-05-09: 0.4 mg via INTRAVENOUS

## 2015-05-16 ENCOUNTER — Encounter (HOSPITAL_BASED_OUTPATIENT_CLINIC_OR_DEPARTMENT_OTHER): Payer: Self-pay | Admitting: *Deleted

## 2015-05-20 ENCOUNTER — Encounter (HOSPITAL_BASED_OUTPATIENT_CLINIC_OR_DEPARTMENT_OTHER): Payer: Self-pay | Admitting: *Deleted

## 2015-05-20 NOTE — Progress Notes (Signed)
NPO AFTER MN.  ARRIVE AT 0915.  NEEDS ISTAT.  CURRENT EKG IN CHART AND EPIC.  WILL TAKE LOPRESSOR AND NEXIUM AM DOS W/ SIPS OF WATER.

## 2015-05-21 ENCOUNTER — Ambulatory Visit (HOSPITAL_COMMUNITY): Payer: Medicare Other | Attending: Cardiology

## 2015-05-21 ENCOUNTER — Other Ambulatory Visit: Payer: Self-pay

## 2015-05-21 DIAGNOSIS — R011 Cardiac murmur, unspecified: Secondary | ICD-10-CM | POA: Insufficient documentation

## 2015-05-21 DIAGNOSIS — Z955 Presence of coronary angioplasty implant and graft: Secondary | ICD-10-CM | POA: Insufficient documentation

## 2015-05-21 DIAGNOSIS — I5189 Other ill-defined heart diseases: Secondary | ICD-10-CM | POA: Insufficient documentation

## 2015-05-21 DIAGNOSIS — I700 Atherosclerosis of aorta: Secondary | ICD-10-CM | POA: Diagnosis not present

## 2015-05-21 DIAGNOSIS — Z87891 Personal history of nicotine dependence: Secondary | ICD-10-CM | POA: Insufficient documentation

## 2015-05-21 DIAGNOSIS — I517 Cardiomegaly: Secondary | ICD-10-CM | POA: Diagnosis not present

## 2015-05-21 DIAGNOSIS — N189 Chronic kidney disease, unspecified: Secondary | ICD-10-CM | POA: Diagnosis not present

## 2015-05-21 DIAGNOSIS — I129 Hypertensive chronic kidney disease with stage 1 through stage 4 chronic kidney disease, or unspecified chronic kidney disease: Secondary | ICD-10-CM | POA: Insufficient documentation

## 2015-05-21 HISTORY — PX: TRANSTHORACIC ECHOCARDIOGRAM: SHX275

## 2015-05-22 ENCOUNTER — Encounter (HOSPITAL_BASED_OUTPATIENT_CLINIC_OR_DEPARTMENT_OTHER): Payer: Self-pay | Admitting: *Deleted

## 2015-05-22 NOTE — H&P (Signed)
History of Present Illness He presents today for reevaluation of distal left ureteral stones. In the interval since I last saw him he endorses the cessation of most of his of his bothersome left renal colic. He endorses having occasional twinge in his left flank or groin pain is fleeting. He has not had to take any additional medication. He continues on tamsulosin that was given to him at the emergency department when the lithiasis were first noted. CT results that were taken at that time indicated that these lithiasis may be chronic in nature as there was no noted hydronephrosis or perinephric stranding. The patient however was symptomatic and required analgesia for symptom control. He denies any bothersome lower urinary tract symptoms today. He has not seen any gross hematuria or had dysuria. He denies passing a kidney stone. He has not had fever or any other signs or symptoms of infection.   Vitals Vital Signs [Data Includes: Last 1 Day]  Recorded: 22Sep2016 11:50AM  Height: 5 ft 9.5 in Weight: 180 lb  BMI Calculated: 26.2 BSA Calculated: 1.99 Blood Pressure: 129 / 69 Temperature: 97.2 F Heart Rate: 66  Physical Exam Constitutional: Well nourished and well developed . No acute distress.  Abdomen: The abdomen is soft and nontender. No masses are palpated. No CVA tenderness. No hernias are palpable. No hepatosplenomegaly noted.    Results/Data Urine [Data Includes: Last 1 Day]   22Sep2016  COLOR YELLOW   APPEARANCE CLEAR   SPECIFIC GRAVITY 1.020   pH 6.0   GLUCOSE NEGATIVE   BILIRUBIN NEGATIVE   KETONE NEGATIVE   BLOOD NEGATIVE   PROTEIN NEGATIVE   NITRITE NEGATIVE   LEUKOCYTE ESTERASE NEGATIVE    The following images/tracing/specimen were independently visualized:  KUB: KUB performed today indicates no movement of the previously visualized lithiasis in the anatomical expected tract of the left distal ureter as visualized on KUB images done earlier this month and correlated with CT  images done in late August as well. There are noted lithiasis in the left renal shadow. The right renal shadow appears clear. The expected anatomical tract of the right ureter appears clear. Previously noted lower lumbar fixation device appears grossly intact.  The following clinical lab reports were reviewed:  Urinalysis is negative for hematuria or pyuria.    Assessment Assessed  1. Calculus of ureter (N20.1)  Plan Benign non-nodular prostatic hyperplasia without lower urinary tract symptoms, Benign prostatic hyperplasia with urinary obstruction, Calculus of ureter  1. Follow-up Keep Future Appt Office  Follow-up  Status: Complete  Done: 22Sep2016 Health Maintenance  2. UA With REFLEX; [Do Not Release]; Status:Complete;   Done: [id number] 11:27AM  Discussion/Summary For observation I described the risks which include but are not limited to silent renal damage, life-threatening infection, need for emergent surgery, failure to pass stone, and pain.    For ureteroscopy I described the risks which include heart attack, stroke, pulmonary embolus, death, bleeding, infection, damage to contiguous structures, positioning injury, ureteral stricture, ureteral avulsion, ureteral injury, need for ureteral stent, inability to perform ureteroscopy, need for an interval procedure, inability to clear stone burden, stent discomfort and pain.    For shockwave lithotripsy I described the risks which include arrhythmia, kidney contusion, kidney hemorrhage, need for transfusion, long-term risk of diabetes or hypertension, back discomfort, flank ecchymosis, flank abrasion, inability to break up stone, inability to pass stone fragments, Steinstrasse, infection associated with obstructing stones, need for different surgical procedure and possible need for repeat shockwave lithotripsy.    I believe  this man would benefit from definitive intervention of his distal ureterolithiasis. I believe he would be a good  candidate for either lithotripsy or definitive ureteroscopy. I'm pleased that he is and has continued to be absent of symptoms. He has plenty of analgesia at home in case he develops renal colic. He'll continue on tamsulosin. He will continue to remain well hydrated. I will send a task to his urologist Dr. Junious Silk and seek his counsel regarding which procedure would be best beneficial to his situation. I will then follow up with the patient and post him for procedure.       Signatures Electronically signed by : Jiles Crocker, Trey Paula; Apr 24 2015  2:11PM EST

## 2015-05-23 ENCOUNTER — Ambulatory Visit (HOSPITAL_BASED_OUTPATIENT_CLINIC_OR_DEPARTMENT_OTHER): Payer: Medicare Other | Admitting: Anesthesiology

## 2015-05-23 ENCOUNTER — Encounter (HOSPITAL_BASED_OUTPATIENT_CLINIC_OR_DEPARTMENT_OTHER)
Admission: RE | Disposition: A | Discharge status: Home / Self Care | Payer: Self-pay | Source: Ambulatory Visit | Attending: Urology

## 2015-05-23 ENCOUNTER — Encounter (HOSPITAL_BASED_OUTPATIENT_CLINIC_OR_DEPARTMENT_OTHER): Payer: Self-pay

## 2015-05-23 ENCOUNTER — Ambulatory Visit (HOSPITAL_BASED_OUTPATIENT_CLINIC_OR_DEPARTMENT_OTHER)
Admission: RE | Admit: 2015-05-23 | Discharge: 2015-05-23 | Disposition: A | Discharge status: Home / Self Care | Payer: Medicare Other | Source: Ambulatory Visit | Attending: Urology | Admitting: Urology

## 2015-05-23 DIAGNOSIS — Z87891 Personal history of nicotine dependence: Secondary | ICD-10-CM | POA: Diagnosis not present

## 2015-05-23 DIAGNOSIS — N138 Other obstructive and reflux uropathy: Secondary | ICD-10-CM | POA: Diagnosis not present

## 2015-05-23 DIAGNOSIS — N3289 Other specified disorders of bladder: Secondary | ICD-10-CM | POA: Diagnosis not present

## 2015-05-23 DIAGNOSIS — K219 Gastro-esophageal reflux disease without esophagitis: Secondary | ICD-10-CM | POA: Diagnosis not present

## 2015-05-23 DIAGNOSIS — R972 Elevated prostate specific antigen [PSA]: Secondary | ICD-10-CM | POA: Insufficient documentation

## 2015-05-23 DIAGNOSIS — I10 Essential (primary) hypertension: Secondary | ICD-10-CM | POA: Insufficient documentation

## 2015-05-23 DIAGNOSIS — Z955 Presence of coronary angioplasty implant and graft: Secondary | ICD-10-CM | POA: Insufficient documentation

## 2015-05-23 DIAGNOSIS — I2510 Atherosclerotic heart disease of native coronary artery without angina pectoris: Secondary | ICD-10-CM | POA: Insufficient documentation

## 2015-05-23 DIAGNOSIS — I252 Old myocardial infarction: Secondary | ICD-10-CM | POA: Insufficient documentation

## 2015-05-23 DIAGNOSIS — R791 Abnormal coagulation profile: Secondary | ICD-10-CM | POA: Insufficient documentation

## 2015-05-23 DIAGNOSIS — N201 Calculus of ureter: Secondary | ICD-10-CM | POA: Insufficient documentation

## 2015-05-23 DIAGNOSIS — N401 Enlarged prostate with lower urinary tract symptoms: Secondary | ICD-10-CM | POA: Diagnosis not present

## 2015-05-23 HISTORY — DX: Personal history of urinary calculi: Z87.442

## 2015-05-23 HISTORY — PX: HOLMIUM LASER APPLICATION: SHX5852

## 2015-05-23 HISTORY — PX: CYSTOSCOPY/RETROGRADE/URETEROSCOPY/STONE EXTRACTION WITH BASKET: SHX5317

## 2015-05-23 HISTORY — DX: Calculus of ureter: N20.1

## 2015-05-23 HISTORY — DX: Cardiac murmur, unspecified: R01.1

## 2015-05-23 LAB — POCT I-STAT 4, (NA,K, GLUC, HGB,HCT)
GLUCOSE: 101 mg/dL — AB (ref 65–99)
HEMATOCRIT: 37 % — AB (ref 39.0–52.0)
Hemoglobin: 12.6 g/dL — ABNORMAL LOW (ref 13.0–17.0)
Potassium: 3.9 mmol/L (ref 3.5–5.1)
SODIUM: 142 mmol/L (ref 135–145)

## 2015-05-23 LAB — APTT: APTT: 30 s (ref 24–37)

## 2015-05-23 LAB — PROTIME-INR
INR: 1.06 (ref 0.00–1.49)
PROTHROMBIN TIME: 14 s (ref 11.6–15.2)

## 2015-05-23 SURGERY — CYSTOSCOPY, WITH CALCULUS REMOVAL USING BASKET
Anesthesia: General | Site: Ureter | Laterality: Left

## 2015-05-23 MED ORDER — LIDOCAINE HCL (CARDIAC) 20 MG/ML IV SOLN
INTRAVENOUS | Status: DC | PRN
  Administered 2015-05-23: 80 mg via INTRAVENOUS

## 2015-05-23 MED ORDER — SODIUM CHLORIDE 0.9 % IR SOLN
Status: DC | PRN
  Administered 2015-05-23: 9000 mL

## 2015-05-23 MED ORDER — FENTANYL CITRATE (PF) 100 MCG/2ML IJ SOLN
INTRAMUSCULAR | Status: DC | PRN
  Administered 2015-05-23 (×2): 25 ug via INTRAVENOUS
  Administered 2015-05-23: 50 ug via INTRAVENOUS

## 2015-05-23 MED ORDER — CEFAZOLIN SODIUM-DEXTROSE 2-3 GM-% IV SOLR
INTRAVENOUS | Status: AC
  Filled 2015-05-23: qty 50

## 2015-05-23 MED ORDER — EPHEDRINE SULFATE 50 MG/ML IJ SOLN
INTRAMUSCULAR | Status: DC | PRN
  Administered 2015-05-23: 10 mg via INTRAVENOUS

## 2015-05-23 MED ORDER — ASPIRIN 81 MG PO TBEC: 81.0000 mg | DELAYED_RELEASE_TABLET | Freq: Every day | ORAL | Status: DC

## 2015-05-23 MED ORDER — PROPOFOL 10 MG/ML IV BOLUS
INTRAVENOUS | Status: DC | PRN
  Administered 2015-05-23: 170 mg via INTRAVENOUS

## 2015-05-23 MED ORDER — TRAMADOL HCL 50 MG PO TABS
50.0000 mg | ORAL_TABLET | Freq: Four times a day (QID) | ORAL | Status: DC | PRN
Start: 2015-05-23 — End: 2018-09-13

## 2015-05-23 MED ORDER — LACTATED RINGERS IV SOLN
INTRAVENOUS | Status: DC
  Administered 2015-05-23 (×2): via INTRAVENOUS
  Filled 2015-05-23: qty 1000

## 2015-05-23 MED ORDER — TRAMADOL HCL 50 MG PO TABS
ORAL_TABLET | ORAL | Status: AC
  Filled 2015-05-23: qty 1

## 2015-05-23 MED ORDER — STERILE WATER FOR IRRIGATION IR SOLN
Status: DC | PRN
Start: 2015-05-23 — End: 2015-05-23
  Administered 2015-05-23: 3000 mL

## 2015-05-23 MED ORDER — DEXAMETHASONE SODIUM PHOSPHATE 4 MG/ML IJ SOLN
INTRAMUSCULAR | Status: DC | PRN
  Administered 2015-05-23 (×3): 10 mg via INTRAVENOUS

## 2015-05-23 MED ORDER — ONDANSETRON HCL 4 MG/2ML IJ SOLN
INTRAMUSCULAR | Status: DC | PRN
  Administered 2015-05-23: 4 mg via INTRAVENOUS

## 2015-05-23 MED ORDER — IOHEXOL 350 MG/ML SOLN
INTRAVENOUS | Status: DC | PRN
  Administered 2015-05-23: 1 mL via URETHRAL

## 2015-05-23 MED ORDER — FENTANYL CITRATE (PF) 100 MCG/2ML IJ SOLN
25.0000 ug | INTRAMUSCULAR | Status: DC | PRN
  Administered 2015-05-23 (×2): 50 ug via INTRAVENOUS
  Filled 2015-05-23: qty 1

## 2015-05-23 MED ORDER — CEFAZOLIN SODIUM 1-5 GM-% IV SOLN
1.0000 g | INTRAVENOUS | Status: DC
  Filled 2015-05-23: qty 50

## 2015-05-23 MED ORDER — PROMETHAZINE HCL 25 MG/ML IJ SOLN
6.2500 mg | INTRAMUSCULAR | Status: DC | PRN
  Filled 2015-05-23: qty 1

## 2015-05-23 MED ORDER — TRAMADOL HCL 50 MG PO TABS
50.0000 mg | ORAL_TABLET | Freq: Once | ORAL | Status: AC
  Administered 2015-05-23: 50 mg via ORAL
  Filled 2015-05-23: qty 1

## 2015-05-23 MED ORDER — FENTANYL CITRATE (PF) 100 MCG/2ML IJ SOLN
INTRAMUSCULAR | Status: AC
  Filled 2015-05-23: qty 4

## 2015-05-23 MED ORDER — CEPHALEXIN 500 MG PO CAPS: 500.0000 mg | ORAL_CAPSULE | Freq: Every day | ORAL | Status: DC

## 2015-05-23 MED ORDER — MEPERIDINE HCL 25 MG/ML IJ SOLN
6.2500 mg | INTRAMUSCULAR | Status: DC | PRN
  Filled 2015-05-23: qty 1

## 2015-05-23 MED ORDER — FENTANYL CITRATE (PF) 100 MCG/2ML IJ SOLN
INTRAMUSCULAR | Status: AC
  Filled 2015-05-23: qty 2

## 2015-05-23 MED ORDER — CEFAZOLIN SODIUM-DEXTROSE 2-3 GM-% IV SOLR
2.0000 g | INTRAVENOUS | Status: AC
  Administered 2015-05-23: 2 g via INTRAVENOUS
  Filled 2015-05-23: qty 50

## 2015-05-23 SURGICAL SUPPLY — 26 items
BAG DRAIN URO-CYSTO SKYTR STRL (DRAIN) ×2 IMPLANT
BAG DRN UROCATH (DRAIN) ×1
BAG URINE DRAINAGE (UROLOGICAL SUPPLIES) ×1 IMPLANT
BASKET LASER NITINOL 1.9FR (BASKET) ×1 IMPLANT
BSKT STON RTRVL 120 1.9FR (BASKET) ×1
CATH COUDE FOLEY 2W 5CC 18FR (CATHETERS) ×1 IMPLANT
CATH INTERMIT  6FR 70CM (CATHETERS) ×1 IMPLANT
CLOTH BEACON ORANGE TIMEOUT ST (SAFETY) ×2 IMPLANT
FIBER LASER TRAC TIP (UROLOGICAL SUPPLIES) ×1 IMPLANT
GLOVE BIO SURGEON STRL SZ 6.5 (GLOVE) ×1 IMPLANT
GLOVE BIO SURGEON STRL SZ7.5 (GLOVE) ×3 IMPLANT
GLOVE BIOGEL PI IND STRL 6.5 (GLOVE) IMPLANT
GLOVE BIOGEL PI INDICATOR 6.5 (GLOVE) ×1
GLOVE SURG SS PI 7.5 STRL IVOR (GLOVE) ×1 IMPLANT
GOWN STRL REUS W/ TWL LRG LVL3 (GOWN DISPOSABLE) ×1 IMPLANT
GOWN STRL REUS W/ TWL XL LVL3 (GOWN DISPOSABLE) ×1 IMPLANT
GOWN STRL REUS W/TWL LRG LVL3 (GOWN DISPOSABLE) ×2
GOWN STRL REUS W/TWL XL LVL3 (GOWN DISPOSABLE) ×2
GUIDEWIRE ANG ZIPWIRE 038X150 (WIRE) ×1 IMPLANT
GUIDEWIRE STR DUAL SENSOR (WIRE) ×2 IMPLANT
IV NS IRRIG 3000ML ARTHROMATIC (IV SOLUTION) ×5 IMPLANT
MANIFOLD NEPTUNE II (INSTRUMENTS) ×1 IMPLANT
PACK CYSTO (CUSTOM PROCEDURE TRAY) ×2 IMPLANT
STENT URET 6FRX26 CONTOUR (STENTS) ×1 IMPLANT
SYRINGE IRR TOOMEY STRL 70CC (SYRINGE) ×1 IMPLANT
WATER STERILE IRR 3000ML UROMA (IV SOLUTION) ×1 IMPLANT

## 2015-05-23 NOTE — Anesthesia Procedure Notes (Signed)
Procedure Name: LMA Insertion Date/Time: 05/23/2015 8:33 AM Performed by: Denna Haggard D Pre-anesthesia Checklist: Patient identified, Emergency Drugs available, Suction available and Patient being monitored Patient Re-evaluated:Patient Re-evaluated prior to inductionOxygen Delivery Method: Circle System Utilized Preoxygenation: Pre-oxygenation with 100% oxygen Intubation Type: IV induction Ventilation: Mask ventilation without difficulty LMA: LMA inserted LMA Size: 4.0 Number of attempts: 1 Airway Equipment and Method: Bite block Placement Confirmation: positive ETCO2 Tube secured with: Tape Dental Injury: Teeth and Oropharynx as per pre-operative assessment

## 2015-05-23 NOTE — Transfer of Care (Signed)
Immediate Anesthesia Transfer of Care Note  Patient: DAINEL ARCIDIACONO  Procedure(s) Performed: Procedure(s) (LRB): CYSTOSCOPY, FULGERATION OF PROSTATE,  LEFT URETEROSCOPY, JJ STENT PLACEMENT (Left) WITH HOLMIUM LASER LITHOTRIPSY (Left)  Patient Location: PACU  Anesthesia Type: General  Level of Consciousness: awake, oriented, sedated and patient cooperative  Airway & Oxygen Therapy: Patient Spontanous Breathing and Patient connected to face mask oxygen  Post-op Assessment: Report given to PACU RN and Post -op Vital signs reviewed and stable  Post vital signs: Reviewed and stable  Complications: No apparent anesthesia complications

## 2015-05-23 NOTE — Anesthesia Preprocedure Evaluation (Addendum)
Anesthesia Evaluation  Patient identified by MRN, date of birth, ID band Patient awake    Reviewed: Allergy & Precautions, NPO status , Patient's Chart, lab work & pertinent test results  Airway Mallampati: II  TM Distance: >3 FB Neck ROM: Full    Dental no notable dental hx.    Pulmonary former smoker,    Pulmonary exam normal breath sounds clear to auscultation       Cardiovascular hypertension, Pt. on medications (-) angina+ CAD, + Past MI (2001) and + Cardiac Stents (2001)  Normal cardiovascular exam Rhythm:Regular Rate:Normal  previous myocardial infarction in 2001 with 90% circumflex lesion treated with PTCA. The LAD had 70-80% stenosis which was treawted with angioplasty. Most recent catheterization in 2003 demonstrated the LAD at 30% mid stenosis, circumflex 60-70% stenosis in the mid AV grove, and 40% stenosis in the previously stented area. The right coronary artery had 30-40% stenosis in the mid portion.   ECHO 05/21/2015: Normal LV size with mild LV hypertrophy. EF 55-60%. Basal inferior hypokinesis. Moderate diastolic dysfunction. Biatrial enlargement. Normal RV size and systolic function. Aortic sclerosis without significant stenosis.   Neuro/Psych negative neurological ROS  negative psych ROS   GI/Hepatic Neg liver ROS, GERD  Medicated and Controlled,  Endo/Other  negative endocrine ROS  Renal/GU negative Renal ROS  negative genitourinary   Musculoskeletal negative musculoskeletal ROS (+)   Abdominal   Peds negative pediatric ROS (+)  Hematology negative hematology ROS (+)   Anesthesia Other Findings   Reproductive/Obstetrics negative OB ROS                            Anesthesia Physical Anesthesia Plan  ASA: III  Anesthesia Plan: General   Post-op Pain Management:    Induction: Intravenous  Airway Management Planned: LMA  Additional Equipment:   Intra-op  Plan:   Post-operative Plan: Extubation in OR  Informed Consent: I have reviewed the patients History and Physical, chart, labs and discussed the procedure including the risks, benefits and alternatives for the proposed anesthesia with the patient or authorized representative who has indicated his/her understanding and acceptance.   Dental advisory given  Plan Discussed with: CRNA  Anesthesia Plan Comments:         Anesthesia Quick Evaluation

## 2015-05-23 NOTE — Op Note (Addendum)
Preoperative diagnosis: Left ureteral stones, elevated PSA Postoperative diagnosis: Left ureteral stones, BPH, prolonged bleeding time  Procedure: Cystoscopy, Fulguration of prostatic urethra and trigone lesion (less than 5 mm), Left ureteroscopy with holmium laser lithotripsy, stone basket extraction, left ureteral stent placement with string  Surgeon: Junious Silk  Anesthesia: Gen.   Indication for procedure: 79 year old with persistent pain from left distal ureteral stones. Elevated PSA.  Findings: On exam under anesthesia the penis was uncircumcised but the foreskin had no phimosis and there was no mass or lesion. The testicles were descended bilaterally and palpably normal. On digital rectal exam the prostate was smooth with mild enlargement. No hard areas or nodules. Landmarks were preserved.  On cystoscopy the urethra appeared normal but oozed, the prostate was vascular with a prominent median lobe oozed, the bladder contained no stone or foreign body. The ureteral orifices were well lateral in the bladder. The mucosa of the bladder appeared normal but it also oozed if touched with the scope or the wires. There was a papillary appearing lesion with benign characteristics, bland just outside of the left ureteral orifice which was fulgurated. I did not appreciate any other tumors but visualization was bloody.   On ureteroscopy the stones were located in the distal left ureter. I did not attempt upper tract access or manipulation given the bleeding. The bleeding wasn't severe but it was enough to severely limit visualization was small endoscopes.  Description of procedure: After consent was obtained patient brought to the operating room. After adequate anesthesia he is placed in lithotomy position and prepped and draped in usual sterile fashion. A timeout was performed to confirm the patient and procedure. An exam under anesthesia was performed. The cystoscope was passed per urethra and the  bladder inspected. The prostate immediately began ooze from the mucosa of the median lobe. It was severely limiting my ability to locate the ureteral orifices which also turned out to be their well lateral nature. I used the Bugbee to fulgurate some bleeding in the prostatic urethra. I was unable to locate the right than the left ureteral orifice. Fulgurated the small early changes to the mucosa near the left ureteral orifice.  I could not get a sensor wire to pass and therefore a glide wire was passed up the left ureter. The ureteral orifices were well lateral and it was difficult to get a wire into the ureteral orifice I used an angled glide wire which went easily. I then exchanged the wire out for a sensor wire using the 6 [place] open-ended catheter. The semirigid ureteroscope was then advanced and the stones were fragmented into small pieces had a setting of 0.2 and 50 with a 200  laser fiber. The ureter otherwise appeared normal and I was able to scope well up toward the proximal ureter and no other fragments were noted. The pieces were sequentially removed and dropped into the bladder. The wire was then backloaded on the cystoscope and a 6 x 26 and ureter stent was advanced. The wire was removed and a good coil seen in the kidney and a good coil in the bladder. I left a string on the stent. The scope was removed and then repassed adjacent to the wire. A flushed out several more clots in it appeared the bleeding was slowly settling down. Also the bleeding from the urethra appeared to be settling down. Therefore the scope was backed out and I placed an 10 [place] coud catheter. This was initially quite bloody and red but over about  a 5 - 8  Minute period the bleeding stopped and the irrigation remained clear. He was awakened and taken to the recovery room in stable condition.  I did send coags due to the prolonged bleeding time and those were normal.  Complications: None  Drains: 6 x 26 cm left ureteral  stent with string, secured to an 66 [place] coud catheter  Specimens: Stone fragments to office lab  Disposition: Patient stable to PACU

## 2015-05-23 NOTE — Anesthesia Postprocedure Evaluation (Signed)
  Anesthesia Post-op Note  Patient: [name]  Procedure(s) Performed: Procedure(s) (LRB): CYSTOSCOPY, FULGERATION OF PROSTATE,  LEFT URETEROSCOPY, JJ STENT PLACEMENT (Left) WITH HOLMIUM LASER LITHOTRIPSY (Left)  Patient Location: PACU  Anesthesia Type: General  Level of Consciousness: awake and alert   Airway and Oxygen Therapy: Patient Spontanous Breathing  Post-op Pain: mild  Post-op Assessment: Post-op Vital signs reviewed, Patient's Cardiovascular Status Stable, Respiratory Function Stable, Patent Airway and No signs of Nausea or vomiting  Last Vitals:  Filed Vitals:   05/23/15 1108  BP:   Pulse: 75  Temp:   Resp: 13    Post-op Vital Signs: stable   Complications: No apparent anesthesia complications

## 2015-05-23 NOTE — Interval H&P Note (Signed)
History and Physical Interval Note:  05/23/2015 8:25 AM  I should add he has some discomfort in LLQ and nausea. He has not seen a stone pass.    [name]

## 2015-05-23 NOTE — Discharge Instructions (Signed)
Foley Catheter Care, Adult °A Foley catheter is a soft, flexible tube that is placed into the bladder to drain urine. A Foley catheter may be inserted if: °· You leak urine or are not able to control when you urinate (urinary incontinence). °· You are not able to urinate when you need to (urinary retention). °· You had prostate surgery or surgery on the genitals. °· You have certain medical conditions, such as multiple sclerosis, dementia, or a spinal cord injury. °If you are going home with a Foley catheter in place, follow the instructions below. °TAKING CARE OF THE CATHETER °1. Wash your hands with soap and water. °2. Using mild soap and warm water on a clean washcloth: °¨ Clean the area on your body closest to the catheter insertion site using a circular motion, moving away from the catheter. Never wipe toward the catheter because this could sweep bacteria up into the urethra and cause infection. °¨ Remove all traces of soap. Pat the area dry with a clean towel. For males, reposition the foreskin. °3. Attach the catheter to your leg so there is no tension on the catheter. Use adhesive tape or a leg strap. If you are using adhesive tape, remove any sticky residue left behind by the previous tape you used. °4. Keep the drainage bag below the level of the bladder, but keep it off the floor. °5. Check throughout the day to be sure the catheter is working and urine is draining freely. Make sure the tubing does not become kinked. °6. Do not pull on the catheter or try to remove it. Pulling could damage internal tissues. °TAKING CARE OF THE DRAINAGE BAGS °You will be given two drainage bags to take home. One is a large overnight drainage bag, and the other is a smaller leg bag that fits underneath clothing. You may wear the overnight bag at any time, but you should never wear the smaller leg bag at night. Follow the instructions below for how to empty, change, and clean your drainage bags. °Emptying the Drainage  Bag °You must empty your drainage bag when it is  -½ full or at least 2-3 times a day. °1. Wash your hands with soap and water. °2. Keep the drainage bag below your hips, below the level of your bladder. This stops urine from going back into the tubing and into your bladder. °3. Hold the dirty bag over the toilet or a clean container. °4. Open the pour spout at the bottom of the bag and empty the urine into the toilet or container. Do not let the pour spout touch the toilet, container, or any other surface. Doing so can place bacteria on the bag, which can cause an infection. °5. Clean the pour spout with a gauze pad or cotton ball that has rubbing alcohol on it. °6. Close the pour spout. °7. Attach the bag to your leg with adhesive tape or a leg strap. °8. Wash your hands well. °Changing the Drainage Bag °Change your drainage bag once a month or sooner if it starts to smell bad or look dirty. Below are steps to follow when changing the drainage bag. °1. Wash your hands with soap and water. °2. Pinch off the rubber catheter so that urine does not spill out. °3. Disconnect the catheter tube from the drainage tube at the connection valve. Do not let the tubes touch any surface. °4. Clean the end of the catheter tube with an alcohol wipe. Use a different alcohol wipe to clean   the end of the drainage tube. 5. Connect the catheter tube to the drainage tube of the clean drainage bag. 6. Attach the new bag to the leg with adhesive tape or a leg strap. Avoid attaching the new bag too tightly. 7. Wash your hands well. Cleaning the Drainage Bag 1. Wash your hands with soap and water. 2. Wash the bag in warm, soapy water. 3. Rinse the bag thoroughly with warm water. 4. Fill the bag with a solution of white vinegar and water (1 cup vinegar to 1 qt warm water [.2 L vinegar to 1 L warm water]). Close the bag and soak it for 30 minutes in the solution. 5. Rinse the bag with warm water. 6. Hang the bag to dry with the  pour spout open and hanging downward. 7. Store the clean bag (once it is dry) in a clean plastic bag. 8. Wash your hands well. PREVENTING INFECTION  Wash your hands before and after handling your catheter.  Take showers daily and wash the area where the catheter enters your body. Do not take baths. Replace wet leg straps with dry ones, if this applies.  Do not use powders, sprays, or lotions on the genital area. Only use creams, lotions, or ointments as directed by your caregiver.  For females, wipe from front to back after each bowel movement.  Drink enough fluids to keep your urine clear or pale yellow unless you have a fluid restriction.  Do not let the drainage bag or tubing touch or lie on the floor.  Wear cotton underwear to absorb moisture and to keep your skin drier. SEEK MEDICAL CARE IF:   Your urine is cloudy or smells unusually bad.  Your catheter becomes clogged.  You are not draining urine into the bag or your bladder feels full.  Your catheter starts to leak. SEEK IMMEDIATE MEDICAL CARE IF:   You have pain, swelling, redness, or pus where the catheter enters the body.  You have pain in the abdomen, legs, lower back, or bladder.  You have a fever.  You see blood fill the catheter, or your urine is pink or red.  You have nausea, vomiting, or chills.  Your catheter gets pulled out. MAKE SURE YOU:   Understand these instructions.  Will watch your condition.  Will get help right away if you are not doing well or get worse.   This information is not intended to replace advice given to you by your health care provider. Make sure you discuss any questions you have with your health care provider.   Document Released: 07/19/2005 Document Revised: 12/03/2013 Document Reviewed: 07/10/2012 Elsevier Interactive Patient Education 2016 [employer]   Ureteral Stent Implantation, Care After Refer to this sheet in the next few weeks. These instructions provide you  with information on caring for yourself after your procedure. Your health care provider may also give you more specific instructions. Your treatment has been planned according to current medical practices, but problems sometimes occur. Call your health care provider if you have any problems or questions after your procedure. WHAT TO EXPECT AFTER THE PROCEDURE You should be back to normal activity within 48 hours after the procedure. Nausea and vomiting may occur and are commonly the result of anesthesia. It is common to experience sharp pain in the back or lower abdomen and penis with voiding. This is caused by movement of the ends of the stent with the act of urinating.It usually goes away within minutes after you have stopped  urinating. HOME CARE INSTRUCTIONS Make sure to drink plenty of fluids. You may have small amounts of bleeding, causing your urine to be red. This is normal. Certain movements may trigger pain or a feeling that you need to urinate. You may be given medicines to prevent infection or bladder spasms. Be sure to take all medicines as directed. Only take over-the-counter or prescription medicines for pain, discomfort, or fever as directed by your health care provider. Do not take aspirin, as this can make bleeding worse.  Your stent will be left in until the foley is removed. It will removed by a health care provider in the office.   Be sure to keep all follow-up appointments so your health care provider can check that you are healing properly.  SEEK MEDICAL CARE IF:  You experience increasing pain.  Your pain medicine is not working. SEEK IMMEDIATE MEDICAL CARE IF: 7. Your urine is dark red or has blood clots. 8. You are leaking urine (incontinent). 9. You have a fever, chills, feeling sick to your stomach (nausea), or vomiting. 10. Your pain is not relieved by pain medicine. 11. The end of the stent comes out of the urethra. 12. You are unable to urinate.   This  information is not intended to replace advice given to you by your health care provider. Make sure you discuss any questions you have with your health care provider.   Document Released: 03/21/2013 Document Revised: 07/24/2013 Document Reviewed: 01/31/2015 Elsevier Interactive Patient Education 2016 [facility] Instructions  Activity: Get plenty of rest for the remainder of the day. A responsible adult should stay with you for 24 hours following the procedure.  For the next 24 hours, DO NOT: -Drive a car -Paediatric nurse -Drink alcoholic beverages -Take any medication unless instructed by your physician -Make any legal decisions or sign important papers.  Meals: Start with liquid foods such as gelatin or soup. Progress to regular foods as tolerated. Avoid greasy, spicy, heavy foods. If nausea and/or vomiting occur, drink only clear liquids until the nausea and/or vomiting subsides. Call your physician if vomiting continues.  Special Instructions/Symptoms: Your throat may feel dry or sore from the anesthesia or the breathing tube placed in your throat during surgery. If this causes discomfort, gargle with warm salt water. The discomfort should disappear within 24 hours.  If you had a scopolamine patch placed behind your ear for the management of post- operative nausea and/or vomiting:  1. The medication in the patch is effective for 72 hours, after which it should be removed.  Wrap patch in a tissue and discard in the trash. Wash hands thoroughly with soap and water. 2. You may remove the patch earlier than 72 hours if you experience unpleasant side effects which may include dry mouth, dizziness or visual disturbances. 3. Avoid touching the patch. Wash your hands with soap and water after contact with the patch.

## 2015-05-23 NOTE — Interval H&P Note (Signed)
History and Physical Interval Note:  05/23/2015 8:25 AM  [name]  has presented today for surgery, with the diagnosis of LEFT DISTAL URETERAL STONES  The various methods of treatment have been discussed with the patient and family. After consideration of risks, benefits and other options for treatment, the patient has consented to  Procedure(s): CYSTOSCOPY WITH LEFT URETEROSCOPY, STONE BASKETRY AND POSSIBLE JJ STENT PLACEMENT (Left) WITH HOLMIUM LASER LITHOTRIPSY (Left) as a surgical intervention .  The patient's history has been reviewed, patient examined, images reviewed, no change in status, stable for surgery.  I have reviewed the patient's chart and labs. Discussed possible need for stent, pre-stent/staged procedure. Questions were answered to the patient's satisfaction.     [name]

## 2015-05-25 ENCOUNTER — Encounter (HOSPITAL_COMMUNITY): Payer: Self-pay | Admitting: Emergency Medicine

## 2015-05-25 ENCOUNTER — Emergency Department (HOSPITAL_COMMUNITY)
Admission: EM | Admit: 2015-05-25 | Discharge: 2015-05-25 | Disposition: A | Discharge status: Home / Self Care | Payer: Medicare Other | Attending: Emergency Medicine | Admitting: Emergency Medicine

## 2015-05-25 DIAGNOSIS — M158 Other polyosteoarthritis: Secondary | ICD-10-CM | POA: Insufficient documentation

## 2015-05-25 DIAGNOSIS — Y846 Urinary catheterization as the cause of abnormal reaction of the patient, or of later complication, without mention of misadventure at the time of the procedure: Secondary | ICD-10-CM | POA: Insufficient documentation

## 2015-05-25 DIAGNOSIS — Z87442 Personal history of urinary calculi: Secondary | ICD-10-CM | POA: Diagnosis not present

## 2015-05-25 DIAGNOSIS — N4889 Other specified disorders of penis: Secondary | ICD-10-CM | POA: Diagnosis not present

## 2015-05-25 DIAGNOSIS — Z79899 Other long term (current) drug therapy: Secondary | ICD-10-CM | POA: Insufficient documentation

## 2015-05-25 DIAGNOSIS — K219 Gastro-esophageal reflux disease without esophagitis: Secondary | ICD-10-CM | POA: Diagnosis not present

## 2015-05-25 DIAGNOSIS — Z960 Presence of urogenital implants: Secondary | ICD-10-CM | POA: Insufficient documentation

## 2015-05-25 DIAGNOSIS — R011 Cardiac murmur, unspecified: Secondary | ICD-10-CM | POA: Insufficient documentation

## 2015-05-25 DIAGNOSIS — Z87891 Personal history of nicotine dependence: Secondary | ICD-10-CM | POA: Diagnosis not present

## 2015-05-25 DIAGNOSIS — Z7982 Long term (current) use of aspirin: Secondary | ICD-10-CM | POA: Insufficient documentation

## 2015-05-25 DIAGNOSIS — Z9861 Coronary angioplasty status: Secondary | ICD-10-CM | POA: Diagnosis not present

## 2015-05-25 DIAGNOSIS — I509 Heart failure, unspecified: Secondary | ICD-10-CM | POA: Insufficient documentation

## 2015-05-25 DIAGNOSIS — I252 Old myocardial infarction: Secondary | ICD-10-CM | POA: Insufficient documentation

## 2015-05-25 DIAGNOSIS — T83031A Leakage of indwelling urethral catheter, initial encounter: Secondary | ICD-10-CM | POA: Diagnosis present

## 2015-05-25 DIAGNOSIS — I10 Essential (primary) hypertension: Secondary | ICD-10-CM | POA: Insufficient documentation

## 2015-05-25 DIAGNOSIS — Y829 Unspecified medical devices associated with adverse incidents: Secondary | ICD-10-CM | POA: Insufficient documentation

## 2015-05-25 DIAGNOSIS — I2510 Atherosclerotic heart disease of native coronary artery without angina pectoris: Secondary | ICD-10-CM | POA: Diagnosis not present

## 2015-05-25 DIAGNOSIS — Z9889 Other specified postprocedural states: Secondary | ICD-10-CM | POA: Insufficient documentation

## 2015-05-25 DIAGNOSIS — Z792 Long term (current) use of antibiotics: Secondary | ICD-10-CM | POA: Insufficient documentation

## 2015-05-25 DIAGNOSIS — T839XXA Unspecified complication of genitourinary prosthetic device, implant and graft, initial encounter: Secondary | ICD-10-CM

## 2015-05-25 LAB — URINALYSIS, ROUTINE W REFLEX MICROSCOPIC
Bilirubin Urine: NEGATIVE
Glucose, UA: NEGATIVE mg/dL
Ketones, ur: NEGATIVE mg/dL
Nitrite: NEGATIVE
Protein, ur: 100 mg/dL — AB
SPECIFIC GRAVITY, URINE: 1.014 (ref 1.005–1.030)
UROBILINOGEN UA: 0.2 mg/dL (ref 0.0–1.0)
pH: 5.5 (ref 5.0–8.0)

## 2015-05-25 LAB — URINE MICROSCOPIC-ADD ON

## 2015-05-25 NOTE — ED Provider Notes (Signed)
CSN: [id number]     Arrival date & time 05/25/15  2021 History   First MD Initiated Contact with Patient 05/25/15 2042     Chief Complaint  Patient presents with  . urinary catheter problem      (Consider location/radiation/quality/duration/timing/severity/associated sxs/prior Treatment) HPI Mr. Waymire is a 79 y.o male with a history of hypertension, CHF, CAD with MI, and kidney stones who presents with left-sided ureteral stent placement 2 days ago. He is now complaining of leakage around the catheter and penile pain. He took a Percocet 5 hours ago for pain. He states he called the urology office and they told him to remove the catheter.  He denies any fever, chills, abdominal pain, nausea, vomiting, hematuria, testicular pain, scrotal swelling.  Past Medical History  Diagnosis Date  . Hypertension   . GERD (gastroesophageal reflux disease)   . Arthritis     knees,back  . CHF (congestive heart failure) ([place])   . CAD (coronary artery disease) CARDIOLOGIST-  DR HOCHREIN    previous myocardial infarction in 2001 with 90% circumflex lesion treated with PTCA. The LAD had 70-80% stenosis which was treawted with angioplasty. Most recent catheterization in 2003 demonstrated the LAD at 30% mid stenosis, circumflex 60-70% stenosis in the mid AV grove, and 40% stenosis in the previously stented area. The right coronary artery had 30-40% stenosis in the mid portion.   Marland Kitchen History of MI (myocardial infarction)     NON-Q WAVE MI 2001  . Left ureteral stone   . History of kidney stones   . Heart murmur    Past Surgical History  Procedure Laterality Date  . Lumbar laminectomy/decompression microdiscectomy N/A 09/28/2012    Procedure: L3  -L5 DECOMPRESSION L4 - L5 FUSION WITH INSTRUMENTATION 2 LEVELS;  Surgeon: Melina Schools, MD;  Location: [facility];  Service: Orthopedics;  Laterality: N/A;  . Knee arthroscopy Bilateral x2 right/  left 2015  . Total shoulder arthroplasty Left 01-14-2011  .  Extracorporeal shock wave lithotripsy Left 10-30-2012  . Electrophysiologic study  03-30-2000   dr Caryl Comes  . Coronary angioplasty with stent placement  01-21-2000  dr hochrein    PTCA to pLAD/  PTCA and BMS x1 to mid LCFX/  normal LVSF  . Cardiac catheterization  03-29-2000   dr hochrein    Nonobstuctive CAD /   CFX patent stent and LAD diffuse luminal irregularities including mid segment previously been angioplastied  . Coronary angioplasty with stent placement  01-10-2002  dr Lyndel Safe    PTCA and BMS to mid AV LCFX (in-stent restenosis)/  diffuse 30-40% mRCA ,  mLAD diffuse 30%,  normal LVSF, ef 65%  . Cardiovascular stress test  05-09-2015   dr hochrein    low risk perfusion study/  no reversible ischemia/  normal LV function and wall motion , ef 63%  . Appendectomy  as teen  . Tonsillectomy  as child  . Cataract extraction w/ intraocular lens  implant, bilateral  left 05-06-2015/  right 06-10-2015  . Transthoracic echocardiogram  05-21-2015    mild LVH,  ef 55-605,  grade 2 diastolic dysfunction, basal inferior hyperkinesis/  moderate AV calcification sclerosis without stenosis/  trivial MR/  moderate LAE/  mild TR/  mild RAE   Family History  Problem Relation Age of Onset  . Prostate cancer Father    Social History  Substance Use Topics  . Smoking status: Former Smoker -- 1.00 packs/day for 3 years    Quit date: 09/26/1962  . Smokeless tobacco:  Never Used  . Alcohol Use: No    Review of Systems  Constitutional: Negative for fever.  Gastrointestinal: Negative for nausea, vomiting and abdominal pain.  Genitourinary: Positive for dysuria and difficulty urinating. Negative for frequency, hematuria, discharge, penile swelling, scrotal swelling and testicular pain.  All other systems reviewed and are negative.     Allergies  Review of patient's allergies indicates no known allergies.  Home Medications   Prior to Admission medications   Medication Sig Start Date End Date Taking?  Authorizing Provider  acetaminophen (TYLENOL) 325 MG tablet Take 650 mg by mouth every 6 (six) hours as needed.   Yes Historical Provider, MD  aspirin 81 MG EC tablet Take 1 tablet (81 mg total) by mouth daily. 05/28/15  Yes Festus Aloe, MD  atorvastatin (LIPITOR) 80 MG tablet Take 1 tablet (80 mg total) by mouth at bedtime. 11/26/13  Yes Minus Breeding, MD  cephALEXin (KEFLEX) 500 MG capsule Take 1 capsule (500 mg total) by mouth at bedtime. 05/23/15  Yes Festus Aloe, MD  Docusate Calcium (STOOL SOFTENER PO) Take 1 tablet by mouth daily.   Yes Historical Provider, MD  esomeprazole (NEXIUM) 40 MG capsule Take 40 mg by mouth daily before breakfast.     Yes Historical Provider, MD  folic acid (FOLVITE) 644 MCG tablet Take 400 mcg by mouth daily.     Yes Historical Provider, MD  metoprolol tartrate (LOPRESSOR) 25 MG tablet Take 1 tablet (25 mg total) by mouth 2 (two) times daily. 11/26/13  Yes Minus Breeding, MD  niacin (NIASPAN) 1000 MG CR tablet Take 1 tablet (1,000 mg total) by mouth at bedtime. 09/14/13  Yes Minus Breeding, MD  nitroGLYCERIN (NITROSTAT) 0.4 MG SL tablet Place 0.4 mg under the tongue every 5 (five) minutes as needed for chest pain.   Yes Historical Provider, MD  ofloxacin (OCUFLOX) 0.3 % ophthalmic solution Place 1 drop into the left eye daily. 05/09/15  Yes Historical Provider, MD  oxyCODONE-acetaminophen (PERCOCET) 5-325 MG per tablet Take 1-2 tablets by mouth every 4 (four) hours as needed for moderate pain or severe pain. 04/02/15  Yes Julianne Rice, MD  PRESCRIPTION MEDICATION Take 1 tablet by mouth once.   Yes Historical Provider, MD  tamsulosin (FLOMAX) 0.4 MG CAPS capsule Take 1 capsule (0.4 mg total) by mouth daily. Patient taking differently: Take 0.4 mg by mouth daily after breakfast.  04/02/15  Yes Julianne Rice, MD  traMADol (ULTRAM) 50 MG tablet Take 1 tablet (50 mg total) by mouth every 6 (six) hours as needed. Patient taking differently: Take 50 mg by mouth  every 6 (six) hours as needed for moderate pain or severe pain.  05/23/15  Yes Festus Aloe, MD   BP 131/71 mmHg  Pulse 78  Temp(Src) 98 F (36.7 C) (Oral)  Resp 18  Ht 5\' 9"  (1.753 m)  Wt 182 lb (82.555 kg)  BMI 26.86 kg/m2  SpO2 94% Physical Exam  Constitutional: He is oriented to person, place, and time. He appears well-developed and well-nourished.  HENT:  Head: Normocephalic and atraumatic.  Eyes: Conjunctivae are normal.  Neck: Normal range of motion. Neck supple.  Cardiovascular: Normal rate, regular rhythm and normal heart sounds.   Pulmonary/Chest: Effort normal and breath sounds normal.  Abdominal: Soft. Bowel sounds are normal. He exhibits no distension. There is no tenderness.  No abdominal tenderness to palpation. No guarding or rebound. No abdominal distention.  Genitourinary: Testes normal and penis normal. Uncircumcised.  GU exam: Chaperone present. Patient is uncircumcised.  The penis appears normal without swelling or erythema. No phimosis or paraphimosis. No penile discharge. He does have some leakage around the catheter site but no hematuria. No testicular swelling or pain.  Musculoskeletal: Normal range of motion.  Neurological: He is alert and oriented to person, place, and time.  Skin: Skin is warm and dry.  Nursing note and vitals reviewed.   ED Course  Procedures (including critical care time)  BLADDER CATHETER REMOVAL Verbal agreement was obtained by the patient to remove bladder catheter. The catheter was deflated by removing 10 mL of saline. The catheter was pulled out smoothly with the stent and string remaining in place. There was a couple of drops of blood which stopped after the catheter was removed. The patient tolerated the procedure well.   Labs Review Labs Reviewed  URINALYSIS, ROUTINE W REFLEX MICROSCOPIC (NOT AT [facility]) - Abnormal; Notable for the following:    APPearance CLOUDY (*)    Hgb urine dipstick LARGE (*)    Protein, ur 100  (*)    Leukocytes, UA SMALL (*)    All other components within normal limits  URINE MICROSCOPIC-ADD ON - Abnormal; Notable for the following:    Bacteria, UA FEW (*)    All other components within normal limits    Imaging Review No results found.   EKG Interpretation None      MDM   Final diagnoses:  Penile pain  Foley catheter problem, initial encounter [facility])  Patient presents for penile pain and leakage around the bladder cath after ureteral stent placement 2 days ago. She is scheduled to have the catheter removed tomorrow. I spoke to Dr. Jeffie Pollock with urology who was aware of the patient and stated that the catheter could be removed. It is attached with a string and may come out with the stent which is okay since he will be following up in the office tomorrow morning. The Foley catheter was removed as discussed in the procedure note above. The patient was able to urinate and stated he felt much better. He denies any dysuria and has small leukocytes on UA.  This is most likely related to the foley cath and I do not believe he needs antibiotics.   I discussed keeping his follow-up tomorrow morning with urology and he verbally agrees with the plan.      Ottie Glazier, PA-C 05/25/15 [place], MD 05/25/15 2228

## 2015-05-25 NOTE — ED Notes (Signed)
Patient states he had a urinary catheter placed after a procedure for kidney stones. Patient states he began having bladder spasms around lunch time. Patient states he has severe pain to his penis when he produces urine. Patient states there is urine leaking around the foley. Patient catheter bag has less than 124ml, emptied last at 1430. Patient states he is scheduled to have catheter removed tomorrow at 0930 at [facility].

## 2015-05-25 NOTE — Discharge Instructions (Signed)
Keep your follow-up appointment with urology tomorrow.

## 2015-05-27 ENCOUNTER — Encounter (HOSPITAL_BASED_OUTPATIENT_CLINIC_OR_DEPARTMENT_OTHER): Payer: Self-pay | Admitting: Urology

## 2015-12-22 DIAGNOSIS — L821 Other seborrheic keratosis: Secondary | ICD-10-CM | POA: Diagnosis not present

## 2015-12-22 DIAGNOSIS — L918 Other hypertrophic disorders of the skin: Secondary | ICD-10-CM | POA: Diagnosis not present

## 2015-12-22 DIAGNOSIS — L820 Inflamed seborrheic keratosis: Secondary | ICD-10-CM | POA: Diagnosis not present

## 2015-12-22 DIAGNOSIS — L570 Actinic keratosis: Secondary | ICD-10-CM | POA: Diagnosis not present

## 2015-12-27 IMAGING — NM NM MISC PROCEDURE
6 series · 36 of 36 positions shown · non-contrast
Comparison: none

[Series 1: wbr_r-proj_st wbr rest · 6.40mm/px · 6 of 64 frames shown]
[frame 6/64]
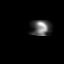
[frame 16/64]
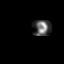
[frame 27/64]
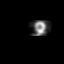
[frame 38/64]
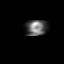
[frame 48/64]
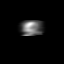
[frame 59/64]
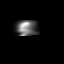

[Series 1: wbr rest · 6.40mm/px · 6 of 64 frames shown]
[frame 6/64]
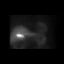
[frame 16/64]
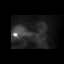
[frame 27/64]
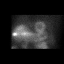
[frame 38/64]
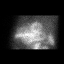
[frame 48/64]
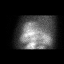
[frame 59/64]
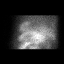

[Series 2: wbr_s-proj_st wbr stress-gsp · 6.40mm/px · 6 of 512 frames shown]
[frame 43/512]
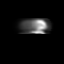
[frame 128/512]
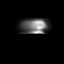
[frame 214/512]
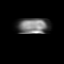
[frame 299/512]
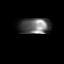
[frame 384/512]
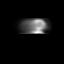
[frame 470/512]
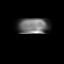

[Series 2: wbr stress-gsp · 6.40mm/px · 6 of 512 frames shown]
[frame 43/512]
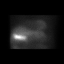
[frame 128/512]
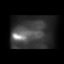
[frame 214/512]
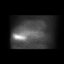
[frame 299/512]
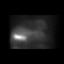
[frame 384/512]
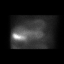
[frame 470/512]
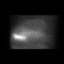

[Series 3: wbr_s-proj_st wbr stress-sum-em · 6.40mm/px · 6 of 64 frames shown]
[frame 6/64]
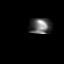
[frame 16/64]
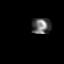
[frame 27/64]
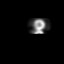
[frame 38/64]
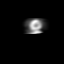
[frame 48/64]
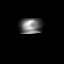
[frame 59/64]
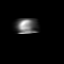

[Series 3: wbr stress-sum-em · 6.40mm/px · 6 of 64 frames shown]
[frame 6/64]
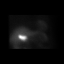
[frame 16/64]
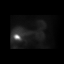
[frame 27/64]
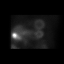
[frame 38/64]
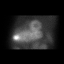
[frame 48/64]
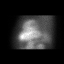
[frame 59/64]
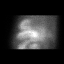

[36 of 36 positions shown; findings below may reference images not displayed]

Canned report from images found in remote index.

Refer to host system for actual result text.

## 2016-01-05 DIAGNOSIS — R972 Elevated prostate specific antigen [PSA]: Secondary | ICD-10-CM | POA: Diagnosis not present

## 2016-01-12 DIAGNOSIS — N400 Enlarged prostate without lower urinary tract symptoms: Secondary | ICD-10-CM | POA: Diagnosis not present

## 2016-01-12 DIAGNOSIS — N200 Calculus of kidney: Secondary | ICD-10-CM | POA: Diagnosis not present

## 2016-01-12 DIAGNOSIS — R972 Elevated prostate specific antigen [PSA]: Secondary | ICD-10-CM | POA: Diagnosis not present

## 2016-02-24 DIAGNOSIS — M1712 Unilateral primary osteoarthritis, left knee: Secondary | ICD-10-CM | POA: Diagnosis not present

## 2016-02-24 DIAGNOSIS — M1711 Unilateral primary osteoarthritis, right knee: Secondary | ICD-10-CM | POA: Diagnosis not present

## 2016-02-24 DIAGNOSIS — M170 Bilateral primary osteoarthritis of knee: Secondary | ICD-10-CM | POA: Diagnosis not present

## 2016-03-17 DIAGNOSIS — M25561 Pain in right knee: Secondary | ICD-10-CM | POA: Diagnosis not present

## 2016-03-17 DIAGNOSIS — M170 Bilateral primary osteoarthritis of knee: Secondary | ICD-10-CM | POA: Diagnosis not present

## 2016-05-06 DIAGNOSIS — Z23 Encounter for immunization: Secondary | ICD-10-CM | POA: Diagnosis not present

## 2016-07-13 DIAGNOSIS — L570 Actinic keratosis: Secondary | ICD-10-CM | POA: Diagnosis not present

## 2016-07-13 DIAGNOSIS — L918 Other hypertrophic disorders of the skin: Secondary | ICD-10-CM | POA: Diagnosis not present

## 2016-07-13 DIAGNOSIS — L821 Other seborrheic keratosis: Secondary | ICD-10-CM | POA: Diagnosis not present

## 2016-08-09 DIAGNOSIS — R972 Elevated prostate specific antigen [PSA]: Secondary | ICD-10-CM | POA: Diagnosis not present

## 2016-08-16 DIAGNOSIS — R972 Elevated prostate specific antigen [PSA]: Secondary | ICD-10-CM | POA: Diagnosis not present

## 2016-08-16 DIAGNOSIS — N400 Enlarged prostate without lower urinary tract symptoms: Secondary | ICD-10-CM | POA: Diagnosis not present

## 2016-09-09 DIAGNOSIS — M25561 Pain in right knee: Secondary | ICD-10-CM | POA: Diagnosis not present

## 2016-09-09 DIAGNOSIS — M25562 Pain in left knee: Secondary | ICD-10-CM | POA: Diagnosis not present

## 2016-09-09 DIAGNOSIS — M170 Bilateral primary osteoarthritis of knee: Secondary | ICD-10-CM | POA: Diagnosis not present

## 2016-09-09 DIAGNOSIS — G8929 Other chronic pain: Secondary | ICD-10-CM | POA: Diagnosis not present

## 2016-09-30 DIAGNOSIS — N183 Chronic kidney disease, stage 3 (moderate): Secondary | ICD-10-CM | POA: Diagnosis not present

## 2016-09-30 DIAGNOSIS — D509 Iron deficiency anemia, unspecified: Secondary | ICD-10-CM | POA: Diagnosis not present

## 2016-09-30 DIAGNOSIS — I10 Essential (primary) hypertension: Secondary | ICD-10-CM | POA: Diagnosis not present

## 2016-09-30 DIAGNOSIS — R7303 Prediabetes: Secondary | ICD-10-CM | POA: Diagnosis not present

## 2016-09-30 DIAGNOSIS — I2510 Atherosclerotic heart disease of native coronary artery without angina pectoris: Secondary | ICD-10-CM | POA: Diagnosis not present

## 2016-09-30 DIAGNOSIS — E782 Mixed hyperlipidemia: Secondary | ICD-10-CM | POA: Diagnosis not present

## 2016-10-01 DIAGNOSIS — E782 Mixed hyperlipidemia: Secondary | ICD-10-CM | POA: Diagnosis not present

## 2016-10-01 DIAGNOSIS — I10 Essential (primary) hypertension: Secondary | ICD-10-CM | POA: Diagnosis not present

## 2016-10-01 DIAGNOSIS — R7303 Prediabetes: Secondary | ICD-10-CM | POA: Diagnosis not present

## 2017-01-11 DIAGNOSIS — L570 Actinic keratosis: Secondary | ICD-10-CM | POA: Diagnosis not present

## 2017-01-11 DIAGNOSIS — D485 Neoplasm of uncertain behavior of skin: Secondary | ICD-10-CM | POA: Diagnosis not present

## 2017-01-27 DIAGNOSIS — D045 Carcinoma in situ of skin of trunk: Secondary | ICD-10-CM | POA: Diagnosis not present

## 2017-02-14 DIAGNOSIS — M170 Bilateral primary osteoarthritis of knee: Secondary | ICD-10-CM | POA: Diagnosis not present

## 2017-02-14 DIAGNOSIS — G8929 Other chronic pain: Secondary | ICD-10-CM | POA: Diagnosis not present

## 2017-02-14 DIAGNOSIS — M25562 Pain in left knee: Secondary | ICD-10-CM | POA: Diagnosis not present

## 2017-02-14 DIAGNOSIS — M25561 Pain in right knee: Secondary | ICD-10-CM | POA: Diagnosis not present

## 2017-02-14 DIAGNOSIS — M1712 Unilateral primary osteoarthritis, left knee: Secondary | ICD-10-CM | POA: Diagnosis not present

## 2017-02-14 DIAGNOSIS — M1711 Unilateral primary osteoarthritis, right knee: Secondary | ICD-10-CM | POA: Diagnosis not present

## 2017-04-07 DIAGNOSIS — Z23 Encounter for immunization: Secondary | ICD-10-CM | POA: Diagnosis not present

## 2017-04-07 DIAGNOSIS — D509 Iron deficiency anemia, unspecified: Secondary | ICD-10-CM | POA: Diagnosis not present

## 2017-04-07 DIAGNOSIS — R7303 Prediabetes: Secondary | ICD-10-CM | POA: Diagnosis not present

## 2017-04-07 DIAGNOSIS — I10 Essential (primary) hypertension: Secondary | ICD-10-CM | POA: Diagnosis not present

## 2017-04-07 DIAGNOSIS — N183 Chronic kidney disease, stage 3 (moderate): Secondary | ICD-10-CM | POA: Diagnosis not present

## 2017-04-07 DIAGNOSIS — E782 Mixed hyperlipidemia: Secondary | ICD-10-CM | POA: Diagnosis not present

## 2017-07-14 DIAGNOSIS — R233 Spontaneous ecchymoses: Secondary | ICD-10-CM | POA: Diagnosis not present

## 2017-07-14 DIAGNOSIS — L570 Actinic keratosis: Secondary | ICD-10-CM | POA: Diagnosis not present

## 2017-07-14 DIAGNOSIS — L821 Other seborrheic keratosis: Secondary | ICD-10-CM | POA: Diagnosis not present

## 2017-08-22 DIAGNOSIS — J302 Other seasonal allergic rhinitis: Secondary | ICD-10-CM | POA: Diagnosis not present

## 2017-08-23 DIAGNOSIS — J069 Acute upper respiratory infection, unspecified: Secondary | ICD-10-CM | POA: Diagnosis not present

## 2017-10-10 DIAGNOSIS — M1712 Unilateral primary osteoarthritis, left knee: Secondary | ICD-10-CM | POA: Diagnosis not present

## 2017-10-10 DIAGNOSIS — M170 Bilateral primary osteoarthritis of knee: Secondary | ICD-10-CM | POA: Diagnosis not present

## 2017-10-10 DIAGNOSIS — M1711 Unilateral primary osteoarthritis, right knee: Secondary | ICD-10-CM | POA: Diagnosis not present

## 2017-10-13 DIAGNOSIS — D509 Iron deficiency anemia, unspecified: Secondary | ICD-10-CM | POA: Diagnosis not present

## 2017-10-13 DIAGNOSIS — R7303 Prediabetes: Secondary | ICD-10-CM | POA: Diagnosis not present

## 2017-10-13 DIAGNOSIS — E782 Mixed hyperlipidemia: Secondary | ICD-10-CM | POA: Diagnosis not present

## 2017-10-13 DIAGNOSIS — N183 Chronic kidney disease, stage 3 (moderate): Secondary | ICD-10-CM | POA: Diagnosis not present

## 2017-10-13 DIAGNOSIS — I10 Essential (primary) hypertension: Secondary | ICD-10-CM | POA: Diagnosis not present

## 2017-10-13 DIAGNOSIS — I2510 Atherosclerotic heart disease of native coronary artery without angina pectoris: Secondary | ICD-10-CM | POA: Diagnosis not present

## 2017-10-13 DIAGNOSIS — K219 Gastro-esophageal reflux disease without esophagitis: Secondary | ICD-10-CM | POA: Diagnosis not present

## 2017-10-13 DIAGNOSIS — Z1389 Encounter for screening for other disorder: Secondary | ICD-10-CM | POA: Diagnosis not present

## 2017-10-17 DIAGNOSIS — D509 Iron deficiency anemia, unspecified: Secondary | ICD-10-CM | POA: Diagnosis not present

## 2017-10-17 DIAGNOSIS — R7303 Prediabetes: Secondary | ICD-10-CM | POA: Diagnosis not present

## 2017-10-17 DIAGNOSIS — I10 Essential (primary) hypertension: Secondary | ICD-10-CM | POA: Diagnosis not present

## 2017-10-17 DIAGNOSIS — E782 Mixed hyperlipidemia: Secondary | ICD-10-CM | POA: Diagnosis not present

## 2018-01-12 DIAGNOSIS — L570 Actinic keratosis: Secondary | ICD-10-CM | POA: Diagnosis not present

## 2018-04-14 DIAGNOSIS — Z0000 Encounter for general adult medical examination without abnormal findings: Secondary | ICD-10-CM | POA: Diagnosis not present

## 2018-04-14 DIAGNOSIS — D509 Iron deficiency anemia, unspecified: Secondary | ICD-10-CM | POA: Diagnosis not present

## 2018-04-14 DIAGNOSIS — Z23 Encounter for immunization: Secondary | ICD-10-CM | POA: Diagnosis not present

## 2018-04-14 DIAGNOSIS — I10 Essential (primary) hypertension: Secondary | ICD-10-CM | POA: Diagnosis not present

## 2018-04-14 DIAGNOSIS — S41132A Puncture wound without foreign body of left upper arm, initial encounter: Secondary | ICD-10-CM | POA: Diagnosis not present

## 2018-04-14 DIAGNOSIS — K219 Gastro-esophageal reflux disease without esophagitis: Secondary | ICD-10-CM | POA: Diagnosis not present

## 2018-04-14 DIAGNOSIS — E782 Mixed hyperlipidemia: Secondary | ICD-10-CM | POA: Diagnosis not present

## 2018-04-14 DIAGNOSIS — N183 Chronic kidney disease, stage 3 (moderate): Secondary | ICD-10-CM | POA: Diagnosis not present

## 2018-04-14 DIAGNOSIS — R7303 Prediabetes: Secondary | ICD-10-CM | POA: Diagnosis not present

## 2018-07-13 DIAGNOSIS — M1712 Unilateral primary osteoarthritis, left knee: Secondary | ICD-10-CM | POA: Diagnosis not present

## 2018-07-13 DIAGNOSIS — M1711 Unilateral primary osteoarthritis, right knee: Secondary | ICD-10-CM | POA: Diagnosis not present

## 2018-08-03 DIAGNOSIS — M179 Osteoarthritis of knee, unspecified: Secondary | ICD-10-CM | POA: Diagnosis not present

## 2018-08-03 DIAGNOSIS — N183 Chronic kidney disease, stage 3 (moderate): Secondary | ICD-10-CM | POA: Diagnosis not present

## 2018-08-03 DIAGNOSIS — R7303 Prediabetes: Secondary | ICD-10-CM | POA: Diagnosis not present

## 2018-08-03 DIAGNOSIS — I2510 Atherosclerotic heart disease of native coronary artery without angina pectoris: Secondary | ICD-10-CM | POA: Diagnosis not present

## 2018-08-03 DIAGNOSIS — E782 Mixed hyperlipidemia: Secondary | ICD-10-CM | POA: Diagnosis not present

## 2018-08-03 DIAGNOSIS — D509 Iron deficiency anemia, unspecified: Secondary | ICD-10-CM | POA: Diagnosis not present

## 2018-08-24 ENCOUNTER — Encounter: Payer: Self-pay | Admitting: Cardiology

## 2018-09-12 NOTE — Progress Notes (Signed)
Cardiology Office Note   Date:  09/13/2018   ID:  [name], DOB 02/05/1936, MRN [id number]  PCP:  Shirline Frees, MD  Cardiologist:   No primary care provider on file. Referring:  Paralee Cancel, MD  Chief Complaint  Patient presents with  . Coronary Artery Disease      History of Present Illness: [name] is a 83 y.o. male who presents for preop evaluation.  He was last seen in 2016.  He has a cardiac history with previous MI in 2001 with 90% circ lesion treated with PTCA.  LAD had 83 - 80% stenosis He was last cathed in 2003 with non obstructive disease.  He had a stress perfusion study prior to another surgery in 2016 that was normal.  Despite knee problems for which he is going to have a left total knee replacement he still very active.  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 The patient denies any new symptoms such as chest discomfort, neck or arm discomfort. There has been no new shortness of breath, PND or orthopnea. There have been no reported palpitations, presyncope or syncope.  He has none of the chest pain it was his previous angina.  Past Medical History:  Diagnosis Date  . Arthritis    knees,back  . CAD (coronary artery disease) CARDIOLOGIST-  DR Neasia Fleeman   previous myocardial infarction in 2001 with 90% circumflex lesion treated with PTCA. The LAD had 70-80% stenosis which was treawted with angioplasty. Most recent catheterization in 2003 demonstrated the LAD at 30% mid stenosis, circumflex 60-70% stenosis in the mid AV grove, and 40% stenosis in the previously stented area. The right coronary artery had 30-40% stenosis in the mid portion.   Marland Kitchen GERD (gastroesophageal reflux disease)   . Heart murmur   . History of kidney stones   . Hypertension   . Left ureteral stone     Past Surgical History:  Procedure Laterality Date  . APPENDECTOMY  as teen  . CARDIAC CATHETERIZATION  03-29-2000   dr Ghada Abbett   Nonobstuctive CAD /   CFX  patent stent and LAD diffuse luminal irregularities including mid segment previously been angioplastied  . CARDIOVASCULAR STRESS TEST  05-09-2015   dr Giorgia Wahler   low risk perfusion study/  no reversible ischemia/  normal LV function and wall motion , ef 63%  . CATARACT EXTRACTION W/ INTRAOCULAR LENS  IMPLANT, BILATERAL  left 05-06-2015/  right 06-10-2015  . CORONARY ANGIOPLASTY WITH STENT PLACEMENT  01-21-2000  dr Anuja Manka   PTCA to pLAD/  PTCA and BMS x1 to mid LCFX/  normal LVSF  . CORONARY ANGIOPLASTY WITH STENT PLACEMENT  01-10-2002  dr Lyndel Safe   PTCA and BMS to mid AV LCFX (in-stent restenosis)/  diffuse 30-40% mRCA ,  mLAD diffuse 30%,  normal LVSF, ef 65%  . CYSTOSCOPY/RETROGRADE/URETEROSCOPY/STONE EXTRACTION WITH BASKET Left 05/23/2015   Procedure: CYSTOSCOPY, FULGERATION OF PROSTATE,  LEFT URETEROSCOPY, JJ STENT PLACEMENT;  Surgeon: Festus Aloe, MD;  Location: [facility];  Service: Urology;  Laterality: Left;  . ELECTROPHYSIOLOGIC STUDY  03-30-2000   dr Caryl Comes  . EXTRACORPOREAL SHOCK WAVE LITHOTRIPSY Left 10-30-2012  . HOLMIUM LASER APPLICATION Left 65/10/5463   Procedure: WITH HOLMIUM LASER LITHOTRIPSY;  Surgeon: Festus Aloe, MD;  Location: [facility];  Service: Urology;  Laterality: Left;  . KNEE ARTHROSCOPY Bilateral x2 right/  left 2015  . LUMBAR LAMINECTOMY/DECOMPRESSION MICRODISCECTOMY N/A 09/28/2012   Procedure:  L3  -L5 DECOMPRESSION L4 - L5 FUSION WITH INSTRUMENTATION 2 LEVELS;  Surgeon: Melina Schools, MD;  Location: [facility];  Service: Orthopedics;  Laterality: N/A;  . TONSILLECTOMY  as child  . TOTAL SHOULDER ARTHROPLASTY Left 01-14-2011  . TRANSTHORACIC ECHOCARDIOGRAM  05-21-2015   mild LVH,  ef 55-605,  grade 2 diastolic dysfunction, basal inferior hyperkinesis/  moderate AV calcification sclerosis without stenosis/  trivial MR/  moderate LAE/  mild TR/  mild RAE     Current Outpatient Medications  Medication Sig Dispense Refill  .  acetaminophen (TYLENOL) 325 MG tablet Take 650 mg by mouth every 6 (six) hours as needed.    Marland Kitchen aspirin 81 MG EC tablet Take 1 tablet (81 mg total) by mouth daily. 30 tablet 12  . atorvastatin (LIPITOR) 80 MG tablet Take 1 tablet (80 mg total) by mouth at bedtime. 90 tablet 3  . esomeprazole (NEXIUM) 40 MG capsule Take 40 mg by mouth daily before breakfast.      . folic acid (FOLVITE) 081 MCG tablet Take 400 mcg by mouth daily.      . metoprolol tartrate (LOPRESSOR) 25 MG tablet Take 1 tablet (25 mg total) by mouth 2 (two) times daily. 180 tablet 3  . niacin 500 MG tablet Take 500 mg by mouth daily.    . nitroGLYCERIN (NITROSTAT) 0.4 MG SL tablet Place 0.4 mg under the tongue every 5 (five) minutes as needed for chest pain.    Mariane Baumgarten Calcium (STOOL SOFTENER PO) Take 1 tablet by mouth daily.     No current facility-administered medications for this visit.     Allergies:   Patient has no known allergies.    Social History:  The patient  reports that he quit smoking about 56 years ago. He has a 3.00 pack-year smoking history. He has never used smokeless tobacco. He reports that he does not drink alcohol or use drugs.   Family History:  The patient's family history includes Heart failure in his mother; Prostate cancer in his father.    ROS:  Please see the history of present illness.   Otherwise, review of systems are positive for none.   All other systems are reviewed and negative.    PHYSICAL EXAM: VS:  BP 130/70   Pulse 63   Ht 5\' 8"  (1.727 m)   Wt 180 lb (81.6 kg)   BMI 27.37 kg/m  , BMI Body mass index is 27.37 kg/m. GENERAL:  Well appearing HEENT:  Pupils equal round and reactive, fundi not visualized, oral mucosa unremarkable NECK:  No jugular venous distention, waveform within normal limits, carotid upstroke brisk and symmetric, no bruits, no thyromegaly LYMPHATICS:  No cervical, inguinal adenopathy LUNGS:  Clear to auscultation bilaterally BACK:  No CVA tenderness CHEST:   Unremarkable HEART:  PMI not displaced or sustained,S1 and S2 within normal limits, no S3, no S4, no clicks, no rubs, 2 out of 6 early peaking systolic murmur heard best at the apex and slightly over the anterior precordium but not the axilla, no diastolic murmurs ABD:  Flat, positive bowel sounds normal in frequency in pitch, no bruits, no rebound, no guarding, no midline pulsatile mass, no hepatomegaly, no splenomegaly EXT:  2 plus pulses throughout, no edema, no cyanosis no clubbing SKIN:  No rashes no nodules NEURO:  Cranial nerves II through XII grossly intact, motor grossly intact throughout PSYCH:  Cognitively intact, oriented to person place and time    EKG:  EKG is ordered today. The  ekg ordered today demonstrates sinus rhythm, rate 63, axis within normal limits, intervals within normal limits, no acute ST-T wave changes.   Recent Labs: No results found for requested labs within last 8760 hours.     Wt Readings from Last 3 Encounters:  09/13/18 180 lb (81.6 kg)  05/25/15 182 lb (82.6 kg)  05/23/15 181 lb 8 oz (82.3 kg)      Other studies Reviewed: Additional studies/ records that were reviewed today include: Echo, Lexiscan Myoview.. Review of the above records demonstrates:  Please see elsewhere in the note.     ASSESSMENT AND PLAN:  PREOP: Patient has had no new symptoms since his echocardiogram and stress test 2006.  He has a very high functional level.  He is not going for high risk procedure.  Therefore, based on ACC/AHA guidelines patient is at acceptable risk for the planned procedure without further testing.  The request is whether he can hold his aspirin for 7 days which would be okay but that timeframe should be as limited as possible and I will defer to the surgeons.  CAD:  The patient has no new sypmtoms.  No further cardiovascular testing is indicated.  We will continue with aggressive risk reduction and meds as listed.    HTN:  The blood pressure is at  target. No change in medications is indicated. We will continue with therapeutic lifestyle changes (TLC).  DYSLIPIDEMIA:  He is at target.  He will continue meds as listed.  I reviewed the labs and he is LDL was 70 with an HDL of 42.  Current medicines are reviewed at length with the patient today.  The patient does not have concerns regarding medicines.  The following changes have been made:  no change  Labs/ tests ordered today include: none  Orders Placed This Encounter  Procedures  . EKG 12-Lead     Disposition:   FU with me in a couple of years.     Signed, Minus Breeding, MD  09/13/2018 10:42 AM    [facility]

## 2018-09-13 ENCOUNTER — Encounter: Payer: Self-pay | Admitting: Cardiology

## 2018-09-13 ENCOUNTER — Ambulatory Visit: Payer: Medicare HMO | Admitting: Cardiology

## 2018-09-13 VITALS — BP 130/70 | HR 63 | Ht 68.0 in | Wt 180.0 lb

## 2018-09-13 DIAGNOSIS — I2510 Atherosclerotic heart disease of native coronary artery without angina pectoris: Secondary | ICD-10-CM | POA: Diagnosis not present

## 2018-09-13 DIAGNOSIS — I2583 Coronary atherosclerosis due to lipid rich plaque: Secondary | ICD-10-CM

## 2018-09-13 NOTE — Patient Instructions (Signed)
Medication Instructions:  Continue current medications  If you need a refill on your cardiac medications before your next appointment, please call your pharmacy.  Labwork: None Ordered   Take the provided lab slips with you to the lab for your blood draw.   When you have your labs (blood work) drawn today and your tests are completely normal, you will receive your results only by MyChart Message (if you have MyChart) -OR-  A paper copy in the mail.  If you have any lab test that is abnormal or we need to change your treatment, we will call you to review these results.  Testing/Procedures: None Ordered  Follow-Up: . Your physician recommends that you schedule a follow-up appointment in: As Needed   At [facility], you and your health needs are our priority.  As part of our continuing mission to provide you with exceptional heart care, we have created designated Provider Care Teams.  These Care Teams include your primary Cardiologist (physician) and Advanced Practice Providers (APPs -  Physician Assistants and Nurse Practitioners) who all work together to provide you with the care you need, when you need it.  Thank you for choosing [facility] at [facility]!!

## 2018-09-20 NOTE — Progress Notes (Signed)
Please place orders in Epic as patient is being scheduled for a pre-op appointment for his surgery on 10/02/2018! Thank you!

## 2018-09-27 NOTE — Patient Instructions (Addendum)
[name]  09/27/2018   Your procedure is scheduled on: 10-02-18  Report to [facility] Main  Entrance               Report to admitting at     1230 PM    Call this number if you have problems the morning of surgery [phone]    Remember: Do not eat food or drink liquids :After Midnight.  BRUSH YOUR TEETH MORNING OF SURGERY AND RINSE YOUR MOUTH OUT, NO CHEWING GUM CANDY OR MINTS.     Take these medicines the morning of surgery with A SIP OF WATER: metoprolol, nexium, tylenol if needed                                You may not have any metal on your body including hair pins and              piercings  Do not wear jewelry,lotions, powders or perfumes, deodorant        .              Men may shave face and neck.   Do not bring valuables to the hospital. [place].  Contacts, dentures or bridgework may not be worn into surgery.  Leave suitcase in the car. After surgery it may be brought to your room.                  Please read over the following fact sheets you were given: _____________________________________________________________________            [facility] - Preparing for Surgery Before surgery, you can play an important role.  Because skin is not sterile, your skin needs to be as free of germs as possible.  You can reduce the number of germs on your skin by washing with CHG (chlorahexidine gluconate) soap before surgery.  CHG is an antiseptic cleaner which kills germs and bonds with the skin to continue killing germs even after washing. Please DO NOT use if you have an allergy to CHG or antibacterial soaps.  If your skin becomes reddened/irritated stop using the CHG and inform your nurse when you arrive at Short Stay. Do not shave (including legs and underarms) for at least 48 hours prior to the first CHG shower.  You may shave your face/neck. Please follow these instructions carefully:  1.   Shower with CHG Soap the night before surgery and the  morning of Surgery.  2.  If you choose to wash your hair, wash your hair first as usual with your  normal  shampoo.  3.  After you shampoo, rinse your hair and body thoroughly to remove the  shampoo.                           4.  Use CHG as you would any other liquid soap.  You can apply chg directly  to the skin and wash                       Gently with a scrungie or clean washcloth.  5.  Apply the CHG Soap to your body ONLY FROM THE NECK  DOWN.   Do not use on face/ open                           Wound or open sores. Avoid contact with eyes, ears mouth and genitals (private parts).                       Wash face,  Genitals (private parts) with your normal soap.             6.  Wash thoroughly, paying special attention to the area where your surgery  will be performed.  7.  Thoroughly rinse your body with warm water from the neck down.  8.  DO NOT shower/wash with your normal soap after using and rinsing off  the CHG Soap.                9.  Pat yourself dry with a clean towel.            10.  Wear clean pajamas.            11.  Place clean sheets on your bed the night of your first shower and do not  sleep with pets. Day of Surgery : Do not apply any lotions/deodorants the morning of surgery.  Please wear clean clothes to the hospital/surgery center.  FAILURE TO FOLLOW THESE INSTRUCTIONS MAY RESULT IN THE CANCELLATION OF YOUR SURGERY PATIENT SIGNATURE_________________________________  NURSE SIGNATURE__________________________________  ________________________________________________________________________  WHAT IS A BLOOD TRANSFUSION? Blood Transfusion Information  A transfusion is the replacement of blood or some of its parts. Blood is made up of multiple cells which provide different functions.  Red blood cells carry oxygen and are used for blood loss replacement.  White blood cells fight against infection.  Platelets control  bleeding.  Plasma helps clot blood.  Other blood products are available for specialized needs, such as hemophilia or other clotting disorders. BEFORE THE TRANSFUSION  Who gives blood for transfusions?   Healthy volunteers who are fully evaluated to make sure their blood is safe. This is blood bank blood. Transfusion therapy is the safest it has ever been in the practice of medicine. Before blood is taken from a donor, a complete history is taken to make sure that person has no history of diseases nor engages in risky social behavior (examples are intravenous drug use or sexual activity with multiple partners). The donor's travel history is screened to minimize risk of transmitting infections, such as malaria. The donated blood is tested for signs of infectious diseases, such as HIV and hepatitis. The blood is then tested to be sure it is compatible with you in order to minimize the chance of a transfusion reaction. If you or a relative donates blood, this is often done in anticipation of surgery and is not appropriate for emergency situations. It takes many days to process the donated blood. RISKS AND COMPLICATIONS Although transfusion therapy is very safe and saves many lives, the main dangers of transfusion include:   Getting an infectious disease.  Developing a transfusion reaction. This is an allergic reaction to something in the blood you were given. Every precaution is taken to prevent this. The decision to have a blood transfusion has been considered carefully by your caregiver before blood is given. Blood is not given unless the benefits outweigh the risks. AFTER THE TRANSFUSION  Right after receiving a blood transfusion, you will usually feel much better and more energetic.  This is especially true if your red blood cells have gotten low (anemic). The transfusion raises the level of the red blood cells which carry oxygen, and this usually causes an energy increase.  The nurse  administering the transfusion will monitor you carefully for complications. HOME CARE INSTRUCTIONS  No special instructions are needed after a transfusion. You may find your energy is better. Speak with your caregiver about any limitations on activity for underlying diseases you may have. SEEK MEDICAL CARE IF:   Your condition is not improving after your transfusion.  You develop redness or irritation at the intravenous (IV) site. SEEK IMMEDIATE MEDICAL CARE IF:  Any of the following symptoms occur over the next 12 hours:  Shaking chills.  You have a temperature by mouth above 102 F (38.9 C), not controlled by medicine.  Chest, back, or muscle pain.  People around you feel you are not acting correctly or are confused.  Shortness of breath or difficulty breathing.  Dizziness and fainting.  You get a rash or develop hives.  You have a decrease in urine output.  Your urine turns a dark color or changes to pink, red, or brown. Any of the following symptoms occur over the next 10 days:  You have a temperature by mouth above 102 F (38.9 C), not controlled by medicine.  Shortness of breath.  Weakness after normal activity.  The white part of the eye turns yellow (jaundice).  You have a decrease in the amount of urine or are urinating less often.  Your urine turns a dark color or changes to pink, red, or brown. Document Released: 07/16/2000 Document Revised: 10/11/2011 Document Reviewed: 03/04/2008 ExitCare Patient Information 2014 [place].  _______________________________________________________________________  Incentive Spirometer  An incentive spirometer is a tool that can help keep your lungs clear and active. This tool measures how well you are filling your lungs with each breath. Taking long deep breaths may help reverse or decrease the chance of developing breathing (pulmonary) problems (especially infection) following:  A long period of time when you are  unable to move or be active. BEFORE THE PROCEDURE   If the spirometer includes an indicator to show your best effort, your nurse or respiratory therapist will set it to a desired goal.  If possible, sit up straight or lean slightly forward. Try not to slouch.  Hold the incentive spirometer in an upright position. INSTRUCTIONS FOR USE  1. Sit on the edge of your bed if possible, or sit up as far as you can in bed or on a chair. 2. Hold the incentive spirometer in an upright position. 3. Breathe out normally. 4. Place the mouthpiece in your mouth and seal your lips tightly around it. 5. Breathe in slowly and as deeply as possible, raising the piston or the ball toward the top of the column. 6. Hold your breath for 3-5 seconds or for as long as possible. Allow the piston or ball to fall to the bottom of the column. 7. Remove the mouthpiece from your mouth and breathe out normally. 8. Rest for a few seconds and repeat Steps 1 through 7 at least 10 times every 1-2 hours when you are awake. Take your time and take a few normal breaths between deep breaths. 9. The spirometer may include an indicator to show your best effort. Use the indicator as a goal to work toward during each repetition. 10. After each set of 10 deep breaths, practice coughing to be sure your lungs are clear.  If you have an incision (the cut made at the time of surgery), support your incision when coughing by placing a pillow or rolled up towels firmly against it. Once you are able to get out of bed, walk around indoors and cough well. You may stop using the incentive spirometer when instructed by your caregiver.  RISKS AND COMPLICATIONS  Take your time so you do not get dizzy or light-headed.  If you are in pain, you may need to take or ask for pain medication before doing incentive spirometry. It is harder to take a deep breath if you are having pain. AFTER USE  Rest and breathe slowly and easily.  It can be helpful to  keep track of a log of your progress. Your caregiver can provide you with a simple table to help with this. If you are using the spirometer at home, follow these instructions: [place] IF:   You are having difficultly using the spirometer.  You have trouble using the spirometer as often as instructed.  Your pain medication is not giving enough relief while using the spirometer.  You develop fever of 100.5 F (38.1 C) or higher. SEEK IMMEDIATE MEDICAL CARE IF:   You cough up bloody sputum that had not been present before.  You develop fever of 102 F (38.9 C) or greater.  You develop worsening pain at or near the incision site. MAKE SURE YOU:   Understand these instructions.  Will watch your condition.  Will get help right away if you are not doing well or get worse. Document Released: 11/29/2006 Document Revised: 10/11/2011 Document Reviewed: 01/30/2007 ExitCare Patient Information 2014 [place].   ________________________________________________________________________    CLEAR LIQUID DIET   Foods Allowed                                                                     Foods Excluded  Coffee and tea, regular and decaf                             liquids that you cannot  Plain Jell-O in any flavor                                             see through such as: Fruit ices (not with fruit pulp)                                     milk, soups, orange juice  Iced Popsicles                                    All solid food Carbonated beverages, regular and diet                                    Cranberry, grape and apple juices Sports drinks like Gatorade Lightly seasoned clear broth  or consume(fat free) Sugar, honey syrup  Sample Menu Breakfast                                Lunch                                     Supper Cranberry juice                    Beef broth                            Chicken broth Jell-O                                      Grape juice                           Apple juice Coffee or tea                        Jell-O                                      Popsicle                                                Coffee or tea                        Coffee or tea  _____________________________________________________________________

## 2018-09-27 NOTE — Progress Notes (Signed)
EKG 09-13-18 epic  Clearance Dr. Jenkins [place] cardiology 09-13-18 chart and lov note in epic

## 2018-09-28 ENCOUNTER — Encounter (HOSPITAL_COMMUNITY)
Admission: RE | Admit: 2018-09-28 | Discharge: 2018-09-28 | Disposition: A | Discharge status: Home / Self Care | Payer: Medicare HMO | Source: Ambulatory Visit | Attending: Orthopedic Surgery | Admitting: Orthopedic Surgery

## 2018-09-28 ENCOUNTER — Encounter (HOSPITAL_COMMUNITY): Payer: Self-pay

## 2018-09-28 ENCOUNTER — Other Ambulatory Visit: Payer: Self-pay

## 2018-09-28 ENCOUNTER — Encounter (INDEPENDENT_AMBULATORY_CARE_PROVIDER_SITE_OTHER): Payer: Self-pay

## 2018-09-28 DIAGNOSIS — Z01812 Encounter for preprocedural laboratory examination: Secondary | ICD-10-CM | POA: Diagnosis not present

## 2018-09-28 HISTORY — DX: Acute myocardial infarction, unspecified: I21.9

## 2018-09-28 HISTORY — DX: Prediabetes: R73.03

## 2018-09-28 HISTORY — DX: Unspecified malignant neoplasm of skin, unspecified: C44.90

## 2018-09-28 LAB — BASIC METABOLIC PANEL
Anion gap: 8 (ref 5–15)
BUN: 17 mg/dL (ref 8–23)
CO2: 23 mmol/L (ref 22–32)
Calcium: 9 mg/dL (ref 8.9–10.3)
Chloride: 109 mmol/L (ref 98–111)
Creatinine, Ser: 1.17 mg/dL (ref 0.61–1.24)
GFR calc Af Amer: >60 mL/min (ref >60)
GFR calc non Af Amer: 59 mL/min — ABNORMAL LOW (ref >60)
Glucose, Bld: 100 mg/dL — ABNORMAL HIGH (ref 70–99)
Potassium: 4.1 mmol/L (ref 3.5–5.1)
Sodium: 140 mmol/L (ref 135–145)

## 2018-09-28 LAB — CBC
HCT: 41.5 % (ref 39.0–52.0)
Hemoglobin: 13 g/dL (ref 13.0–17.0)
MCH: 30.6 pg (ref 26.0–34.0)
MCHC: 31.3 g/dL (ref 30.0–36.0)
MCV: 97.6 fL (ref 80.0–100.0)
NRBC: 0 % (ref 0.0–0.2)
Platelets: 141 10*3/uL — ABNORMAL LOW (ref 150–400)
RBC: 4.25 MIL/uL (ref 4.22–5.81)
RDW: 13.7 % (ref 11.5–15.5)
WBC: 6.1 10*3/uL (ref 4.0–10.5)

## 2018-09-28 LAB — SURGICAL PCR SCREEN
MRSA, PCR: NEGATIVE
Staphylococcus aureus: NEGATIVE

## 2018-09-28 NOTE — Progress Notes (Signed)
Consent to be clarified pt. States his left knee is to be operated on and consent states right knee Left VM with Sherri at Dr. Aurea Graff office .Also pt concerned  Because his insurance also stated right knee arthroplasty. Pt. Has appt. At Dr. Honor Loh office 09-28-18@ 1430

## 2018-09-29 LAB — ABO/RH: ABO/RH(D): A POS

## 2018-09-29 NOTE — Progress Notes (Signed)
Anesthesia Chart Review   Case:  [id number] Date/Time:  10/02/18 1445   Procedure:  TOTAL LEFT  KNEE ARTHROPLASTY (Left ) - 90 mins- Ok per Tiffany   Anesthesia type:  Spinal   Pre-op diagnosis:  LEFT knee osteoarthritis   Location:  [place] [facility]   Surgeon:  Paralee Cancel, MD      DISCUSSION:83 yo former smoker (3 pack years, quit 2/65/64) with h/o HTN, pre-diabetes, GERD, CAD (MI cardiac stents), left knee OA scheduled for above surgery 10/02/18 with Dr. Paralee Cancel.   Pt last seen by cardiologist, Dr. Minus Breeding, 09/13/18.  Per his note, "Patient has had no new symptoms since his echocardiogram and stress test 2006.  He has a very high functional level.  He is not going for high risk procedure.  Therefore, based on ACC/AHA guidelines patient is at acceptable risk for the planned procedure without further testing.  The request is whether he can hold his aspirin for 7 days which would be okay but that timeframe should be as limited as possible and I will defer to the surgeons."  Pt can proceed with planned procedure barring acute status change.  VS: BP 133/81 (BP Location: Right Arm)   Pulse 68   Temp 36.4 C (Oral)   Resp 18   Ht 5\' 8"  (1.727 m)   Wt 81.4 kg   SpO2 99%   BMI 27.30 kg/m   PROVIDERS: Shirline Frees, MD is PCP   Warren Lacy, MD is Cardiologist  LABS: Labs reviewed: Acceptable for surgery. (all labs ordered are listed, but only abnormal results are displayed)  Labs Reviewed  BASIC METABOLIC PANEL - Abnormal; Notable for the following components:      Result Value   Glucose, Bld 100 (*)    GFR calc non Af Amer 59 (*)    All other components within normal limits  CBC - Abnormal; Notable for the following components:   Platelets 141 (*)    All other components within normal limits  SURGICAL PCR SCREEN  TYPE AND SCREEN  ABO/RH     IMAGES:   EKG: 09/13/2018 Rate 63 bpm Normal sinus rhythm  Normal ECG  CV: Echo 05/20/2014 Study Conclusions  -  Left ventricle: The cavity size was normal. Wall thickness was   increased in a pattern of mild LVH. Systolic function was normal.   The estimated ejection fraction was in the range of 55% to 60%.   Basal inferior hypokinesis. Features are consistent with a   pseudonormal left ventricular filling pattern, with concomitant   abnormal relaxation and increased filling pressure (grade 2   diastolic dysfunction). - Aortic valve: Trileaflet; moderately calcified leaflets.   Sclerosis without stenosis. - Mitral valve: Mildly calcified annulus. Mildly calcified leaflets   . Flattened closure of the mitral valve leaflets without frank   prolapse. There was trivial regurgitation. - Left atrium: The atrium was moderately dilated. - Right ventricle: The cavity size was normal. Systolic function   was normal. - Right atrium: The atrium was mildly dilated. - Tricuspid valve: Peak RV-RA gradient (S): 30 mm Hg. - Inferior vena cava: Not visualized.  Impressions:  - Normal LV size with mild LV hypertrophy. EF 55-60%. Basal   inferior hypokinesis. Moderate diastolic dysfunction. Biatrial   enlargement. Normal RV size and systolic function. Aortic   sclerosis without significant stenosis.  Stress Test 05/09/2015  The left ventricular ejection fraction is normal (55-65%).  Nuclear stress EF: 63%.  No T wave inversion  was noted during stress.  There was no ST segment deviation noted during stress.  This is a low risk study.   No reversible ischemia. LVEF 63% with normal wall motion. This is a low risk study. Past Medical History:  Diagnosis Date  . Arthritis    knees,back  . CAD (coronary artery disease) CARDIOLOGIST-  DR HOCHREIN   previous myocardial infarction in 2001 with 90% circumflex lesion treated with PTCA. The LAD had 70-80% stenosis which was treawted with angioplasty. Most recent catheterization in 2003 demonstrated the LAD at 30% mid stenosis, circumflex 60-70% stenosis in the  mid AV grove, and 40% stenosis in the previously stented area. The right coronary artery had 30-40% stenosis in the mid portion.   Marland Kitchen GERD (gastroesophageal reflux disease)   . Heart murmur   . History of kidney stones   . Hypertension   . Left ureteral stone   . Myocardial infarction ([place])   . Pre-diabetes   . Skin cancer    face    Past Surgical History:  Procedure Laterality Date  . APPENDECTOMY  as teen  . CARDIAC CATHETERIZATION  03-29-2000   dr hochrein   Nonobstuctive CAD /   CFX patent stent and LAD diffuse luminal irregularities including mid segment previously been angioplastied  . CARDIOVASCULAR STRESS TEST  05-09-2015   dr hochrein   low risk perfusion study/  no reversible ischemia/  normal LV function and wall motion , ef 63%  . CATARACT EXTRACTION W/ INTRAOCULAR LENS  IMPLANT, BILATERAL  left 05-06-2015/  right 06-10-2015  . CORONARY ANGIOPLASTY WITH STENT PLACEMENT  01-21-2000  dr hochrein   PTCA to pLAD/  PTCA and BMS x1 to mid LCFX/  normal LVSF  . CORONARY ANGIOPLASTY WITH STENT PLACEMENT  01-10-2002  dr Lyndel Safe   PTCA and BMS to mid AV LCFX (in-stent restenosis)/  diffuse 30-40% mRCA ,  mLAD diffuse 30%,  normal LVSF, ef 65%  . CYSTOSCOPY/RETROGRADE/URETEROSCOPY/STONE EXTRACTION WITH BASKET Left 05/23/2015   Procedure: CYSTOSCOPY, FULGERATION OF PROSTATE,  LEFT URETEROSCOPY, JJ STENT PLACEMENT;  Surgeon: Festus Aloe, MD;  Location: [facility];  Service: Urology;  Laterality: Left;  . ELECTROPHYSIOLOGIC STUDY  03-30-2000   dr Caryl Comes  . EXTRACORPOREAL SHOCK WAVE LITHOTRIPSY Left 10-30-2012  . HOLMIUM LASER APPLICATION Left 22/29/7989   Procedure: WITH HOLMIUM LASER LITHOTRIPSY;  Surgeon: Festus Aloe, MD;  Location: [facility];  Service: Urology;  Laterality: Left;  . KNEE ARTHROSCOPY Bilateral x2 right/  left 2015  . LUMBAR LAMINECTOMY/DECOMPRESSION MICRODISCECTOMY N/A 09/28/2012   Procedure: L3  -L5 DECOMPRESSION L4 - L5 FUSION  WITH INSTRUMENTATION 2 LEVELS;  Surgeon: Melina Schools, MD;  Location: [facility];  Service: Orthopedics;  Laterality: N/A;  . TONSILLECTOMY  as child  . TOTAL SHOULDER ARTHROPLASTY Right 01/14/2011  . TRANSTHORACIC ECHOCARDIOGRAM  05-21-2015   mild LVH,  ef 55-605,  grade 2 diastolic dysfunction, basal inferior hyperkinesis/  moderate AV calcification sclerosis without stenosis/  trivial MR/  moderate LAE/  mild TR/  mild RAE    MEDICATIONS: . acetaminophen (TYLENOL) 650 MG CR tablet  . aspirin 81 MG EC tablet  . atorvastatin (LIPITOR) 80 MG tablet  . esomeprazole (NEXIUM) 20 MG capsule  . ferrous sulfate 325 (65 FE) MG tablet  . folic acid (FOLVITE) 211 MCG tablet  . metoprolol tartrate (LOPRESSOR) 25 MG tablet  . niacin 500 MG tablet  . nitroGLYCERIN (NITROSTAT) 0.4 MG SL tablet   No current facility-administered medications for this encounter.  Maia Plan [facility] Pre-Surgical Testing [phone] 09/29/18 11:44 AM

## 2018-09-29 NOTE — H&P (Signed)
TOTAL KNEE ADMISSION H&P  Patient is being admitted for left total knee arthroplasty.  Subjective:  Chief Complaint:left knee pain.  HPI: [name], 83 y.o. male, has a history of pain and functional disability in the left knee due to arthritis and has failed non-surgical conservative treatments for greater than 12 weeks to includecorticosteriod injections, viscosupplementation injections and activity modification.  Onset of symptoms was gradual, starting 5 years ago with gradually worsening course since that time. The patient noted prior procedures on the knee to include  arthroscopy on the left knee(s).  Patient currently rates pain in the left knee(s) at 7 out of 10 with activity. Patient has worsening of pain with activity and weight bearing, pain that interferes with activities of daily living and joint swelling.  Patient has evidence of no fracture, no dislocation and bilateral bone on bone degenerative changes with complete loss of joint space and periarticular osteophytes and cystic changes mainly in the medial with moderate patellofemoral changes identified. This is consistent with both knees. by imaging studies. There is no active infection.  Patient Active Problem List   Diagnosis Date Noted  . Preoperative clearance 05/08/2015  . Newly recognized murmur 05/08/2015  . Overweight(278.02) 07/13/2011  . Coronary atherosclerosis 01/05/2010  . GERD 01/05/2010  . ARTHRITIS 01/05/2010  . HYPERCHOLESTEROLEMIA-PURE 03/06/2009   Past Medical History:  Diagnosis Date  . Arthritis    knees,back  . CAD (coronary artery disease) CARDIOLOGIST-  DR HOCHREIN   previous myocardial infarction in 2001 with 90% circumflex lesion treated with PTCA. The LAD had 70-80% stenosis which was treawted with angioplasty. Most recent catheterization in 2003 demonstrated the LAD at 30% mid stenosis, circumflex 60-70% stenosis in the mid AV grove, and 40% stenosis in the previously stented area. The right  coronary artery had 30-40% stenosis in the mid portion.   Marland Kitchen GERD (gastroesophageal reflux disease)   . Heart murmur   . History of kidney stones   . Hypertension   . Left ureteral stone   . Myocardial infarction ([place])   . Pre-diabetes   . Skin cancer    face    Past Surgical History:  Procedure Laterality Date  . APPENDECTOMY  as teen  . CARDIAC CATHETERIZATION  03-29-2000   dr hochrein   Nonobstuctive CAD /   CFX patent stent and LAD diffuse luminal irregularities including mid segment previously been angioplastied  . CARDIOVASCULAR STRESS TEST  05-09-2015   dr hochrein   low risk perfusion study/  no reversible ischemia/  normal LV function and wall motion , ef 63%  . CATARACT EXTRACTION W/ INTRAOCULAR LENS  IMPLANT, BILATERAL  left 05-06-2015/  right 06-10-2015  . CORONARY ANGIOPLASTY WITH STENT PLACEMENT  01-21-2000  dr hochrein   PTCA to pLAD/  PTCA and BMS x1 to mid LCFX/  normal LVSF  . CORONARY ANGIOPLASTY WITH STENT PLACEMENT  01-10-2002  dr Lyndel Safe   PTCA and BMS to mid AV LCFX (in-stent restenosis)/  diffuse 30-40% mRCA ,  mLAD diffuse 30%,  normal LVSF, ef 65%  . CYSTOSCOPY/RETROGRADE/URETEROSCOPY/STONE EXTRACTION WITH BASKET Left 05/23/2015   Procedure: CYSTOSCOPY, FULGERATION OF PROSTATE,  LEFT URETEROSCOPY, JJ STENT PLACEMENT;  Surgeon: Festus Aloe, MD;  Location: [facility];  Service: Urology;  Laterality: Left;  . ELECTROPHYSIOLOGIC STUDY  03-30-2000   dr Caryl Comes  . EXTRACORPOREAL SHOCK WAVE LITHOTRIPSY Left 10-30-2012  . HOLMIUM LASER APPLICATION Left 62/95/2841   Procedure: WITH HOLMIUM LASER LITHOTRIPSY;  Surgeon: Festus Aloe, MD;  Location: [facility];  Service: Urology;  Laterality: Left;  . KNEE ARTHROSCOPY Bilateral x2 right/  left 2015  . LUMBAR LAMINECTOMY/DECOMPRESSION MICRODISCECTOMY N/A 09/28/2012   Procedure: L3  -L5 DECOMPRESSION L4 - L5 FUSION WITH INSTRUMENTATION 2 LEVELS;  Surgeon: Melina Schools, MD;  Location: [facility];  Service: Orthopedics;  Laterality: N/A;  . TONSILLECTOMY  as child  . TOTAL SHOULDER ARTHROPLASTY Right 01/14/2011  . TRANSTHORACIC ECHOCARDIOGRAM  05-21-2015   mild LVH,  ef 55-605,  grade 2 diastolic dysfunction, basal inferior hyperkinesis/  moderate AV calcification sclerosis without stenosis/  trivial MR/  moderate LAE/  mild TR/  mild RAE    No current facility-administered medications for this encounter.    Current Outpatient Medications  Medication Sig Dispense Refill Last Dose  . acetaminophen (TYLENOL) 650 MG CR tablet Take 1,300 mg by mouth every 8 (eight) hours as needed for pain.     Marland Kitchen aspirin 81 MG EC tablet Take 1 tablet (81 mg total) by mouth daily. 30 tablet 12 Taking  . atorvastatin (LIPITOR) 80 MG tablet Take 1 tablet (80 mg total) by mouth at bedtime. 90 tablet 3 Taking  . esomeprazole (NEXIUM) 20 MG capsule Take 20 mg by mouth daily before breakfast.    Taking  . ferrous sulfate 325 (65 FE) MG tablet Take 325 mg by mouth daily with breakfast.     . folic acid (FOLVITE) 664 MCG tablet Take 800 mcg by mouth daily.    Taking  . metoprolol tartrate (LOPRESSOR) 25 MG tablet Take 1 tablet (25 mg total) by mouth 2 (two) times daily. 180 tablet 3 Taking  . niacin 500 MG tablet Take 500 mg by mouth daily.   Taking  . nitroGLYCERIN (NITROSTAT) 0.4 MG SL tablet Place 0.4 mg under the tongue every 5 (five) minutes as needed for chest pain.   Taking   No Known Allergies  Social History   Tobacco Use  . Smoking status: Former Smoker    Packs/day: 1.00    Years: 3.00    Pack years: 3.00    Last attempt to quit: 09/26/1962    Years since quitting: 56.0  . Smokeless tobacco: Never Used  Substance Use Topics  . Alcohol use: No    Family History  Problem Relation Age of Onset  . Heart failure Mother   . Prostate cancer Father      ROS Constitutional: Constitutional: no fever, chills, night sweats, or significant weight loss.  Cardiovascular: Cardiovascular: no  palpitations or chest pain.  Respiratory: Respiratory: no cough or shortness of breath and No COPD.  Gastrointestinal: Gastrointestinal: no vomiting or nausea.  Musculoskeletal: Musculoskeletal: no swelling in Joints and Joint Pain.  Neurologic: Neurologic: no numbness, tingling, or difficulty with balance.  Objective:  Physical Exam Patient is an 83 year old male.  Well nourished and well developed.  General: Alert and oriented x3, cooperative and pleasant, no acute distress.  Head: normocephalic, atraumatic, neck supple.  Eyes: EOMI.  Respiratory: breath sounds clear in all fields, no wheezing, rales, or rhonchi.  Cardiovascular: Regular rate and rhythm, no murmurs, gallops or rubs.  Abdomen: non-tender to palpation and soft, normoactive bowel sounds.  Musculoskeletal: Varus deformity. Tenderness in the medial joint line. Moderate patellofemoral crepitation. No effusion is noted. Negative McMurray's.  Calves soft and nontender. Motor function intact in LE. Strength 5/5 LE bilaterally.  Neuro: Distal pulses 2+. Sensation to light touch intact in LE. Vital signs in last 24 hours:    Labs:   Estimated  body mass index is 27.3 kg/m as calculated from the following:   Height as of 09/28/18: 5\' 8"  (1.727 m).   Weight as of 09/28/18: 81.4 kg.   Imaging Review Plain radiographs demonstrate severe degenerative joint disease of the left knee(s). The overall alignment isneutral. The bone quality appears to be adequate for age and reported activity level.      Assessment/Plan:  End stage arthritis, left knee   The patient history, physical examination, clinical judgment of the provider and imaging studies are consistent with end stage degenerative joint disease of the left knee(s) and total knee arthroplasty is deemed medically necessary. The treatment options including medical management, injection therapy arthroscopy and arthroplasty were discussed at length. The risks  and benefits of total knee arthroplasty were presented and reviewed. The risks due to aseptic loosening, infection, stiffness, patella tracking problems, thromboembolic complications and other imponderables were discussed. The patient acknowledged the explanation, agreed to proceed with the plan and consent was signed. Patient is being admitted for inpatient treatment for surgery, pain control, PT, OT, prophylactic antibiotics, VTE prophylaxis, progressive ambulation and ADL's and discharge planning. The patient is planning to be discharged home.  Therapy Plans: outpatient therapy at [facility] in [place] Disposition: Home with wife Planned DVT Prophylaxis: aspirin 81mg  BID DME needed: none PCP: Dr. Kenton Kingfisher Cardiologist: Dr. Percival Spanish  TXA: IV Allergies: NKDA Anesthesia Concerns: none BMI: 27  Last HgbA1c: 5.9% (Prediabetic)  Other: Hx of MI in 2001, with stent placement.   Anticipated LOS equal to or greater than 2 midnights due to - Age 3 and older with one or more of the following:  - Obesity  - Expected need for hospital services (PT, OT, Nursing) required for safe  discharge  - Anticipated need for postoperative skilled nursing care or inpatient rehab  - Active co-morbidities: Coronary Artery Disease and Heart Attack OR   - Unanticipated findings during/Post Surgery: None  - Patient is a high risk of re-admission due to: None   - Patient was instructed on what medications to stop prior to surgery. - Follow-up visit in 2 weeks with Dr. Alvan Dame - Begin physical therapy following surgery - Pre-operative lab work as pre-surgical testing - Prescriptions will be provided in hospital at time of discharge  Griffith Citron, PA-C Orthopedic Surgery [facility] [place]

## 2018-09-29 NOTE — Anesthesia Preprocedure Evaluation (Addendum)
Anesthesia Evaluation  Patient identified by MRN, date of birth, ID band Patient awake    Reviewed: Allergy & Precautions, NPO status , Patient's Chart, lab work & pertinent test results  Airway Mallampati: II  TM Distance: >3 FB Neck ROM: Full    Dental no notable dental hx. (+) Teeth Intact   Pulmonary neg pulmonary ROS, former smoker,    Pulmonary exam normal breath sounds clear to auscultation       Cardiovascular hypertension, Pt. on medications + CAD, + Past MI and + Cardiac Stents  Normal cardiovascular exam Rhythm:Regular Rate:Normal  09/13/2018 EKG SR R 63  05/2015 Echo Left ventricle: The cavity size was normal. Wall thickness was   increased in a pattern of mild LVH. Systolic function was normal.   The estimated ejection fraction was in the range of 55% to 60%.   Basal inferior hypokinesis. Features are consistent with a   pseudonormal left ventricular filling pattern, with concomitant   abnormal relaxation and increased filling pressure (grade 2   diastolic dysfunction).   Neuro/Psych negative neurological ROS  negative psych ROS   GI/Hepatic Neg liver ROS, GERD  Medicated,  Endo/Other  negative endocrine ROS  Renal/GU negative Renal ROS     Musculoskeletal  (+) Arthritis ,   Abdominal   Peds  Hematology Hgb 13.0  Plt 141  T & S  A Neg   Anesthesia Other Findings   Reproductive/Obstetrics                          Anesthesia Physical Anesthesia Plan  ASA: III  Anesthesia Plan: Spinal   Post-op Pain Management:  Regional for Post-op pain   Induction:   PONV Risk Score and Plan: Treatment may vary due to age or medical condition, Ondansetron and Dexamethasone  Airway Management Planned: Natural Airway and Nasal Cannula  Additional Equipment:   Intra-op Plan:   Post-operative Plan:   Informed Consent:   Plan Discussed with:   Anesthesia Plan Comments: (See  PAT note 09/27/18, Konrad Felix, PA-C  L TKA under Spinal w L Adductor canal)       Anesthesia Quick Evaluation

## 2018-10-02 ENCOUNTER — Encounter (HOSPITAL_COMMUNITY): Payer: Self-pay | Admitting: *Deleted

## 2018-10-02 ENCOUNTER — Observation Stay (HOSPITAL_COMMUNITY)
Admission: RE | Admit: 2018-10-02 | Discharge: 2018-10-03 | Disposition: A | Discharge status: Home / Self Care | Payer: Medicare HMO | Attending: Orthopedic Surgery | Admitting: Orthopedic Surgery

## 2018-10-02 ENCOUNTER — Encounter (HOSPITAL_COMMUNITY)
Admission: RE | Disposition: A | Discharge status: Home / Self Care | Payer: Self-pay | Source: Home / Self Care | Attending: Orthopedic Surgery

## 2018-10-02 ENCOUNTER — Other Ambulatory Visit: Payer: Self-pay

## 2018-10-02 ENCOUNTER — Ambulatory Visit (HOSPITAL_COMMUNITY): Payer: Medicare HMO | Admitting: Physician Assistant

## 2018-10-02 ENCOUNTER — Ambulatory Visit (HOSPITAL_COMMUNITY): Payer: Medicare HMO | Admitting: Anesthesiology

## 2018-10-02 DIAGNOSIS — Z7982 Long term (current) use of aspirin: Secondary | ICD-10-CM | POA: Diagnosis not present

## 2018-10-02 DIAGNOSIS — R7303 Prediabetes: Secondary | ICD-10-CM | POA: Diagnosis not present

## 2018-10-02 DIAGNOSIS — R2689 Other abnormalities of gait and mobility: Secondary | ICD-10-CM | POA: Insufficient documentation

## 2018-10-02 DIAGNOSIS — R2681 Unsteadiness on feet: Secondary | ICD-10-CM | POA: Diagnosis not present

## 2018-10-02 DIAGNOSIS — K219 Gastro-esophageal reflux disease without esophagitis: Secondary | ICD-10-CM | POA: Diagnosis not present

## 2018-10-02 DIAGNOSIS — E7800 Pure hypercholesterolemia, unspecified: Secondary | ICD-10-CM | POA: Insufficient documentation

## 2018-10-02 DIAGNOSIS — I2510 Atherosclerotic heart disease of native coronary artery without angina pectoris: Secondary | ICD-10-CM | POA: Diagnosis not present

## 2018-10-02 DIAGNOSIS — Z87891 Personal history of nicotine dependence: Secondary | ICD-10-CM | POA: Diagnosis not present

## 2018-10-02 DIAGNOSIS — M1711 Unilateral primary osteoarthritis, right knee: Secondary | ICD-10-CM | POA: Diagnosis not present

## 2018-10-02 DIAGNOSIS — Z8249 Family history of ischemic heart disease and other diseases of the circulatory system: Secondary | ICD-10-CM | POA: Insufficient documentation

## 2018-10-02 DIAGNOSIS — Z6827 Body mass index (BMI) 27.0-27.9, adult: Secondary | ICD-10-CM | POA: Diagnosis not present

## 2018-10-02 DIAGNOSIS — I252 Old myocardial infarction: Secondary | ICD-10-CM | POA: Insufficient documentation

## 2018-10-02 DIAGNOSIS — E663 Overweight: Secondary | ICD-10-CM | POA: Diagnosis not present

## 2018-10-02 DIAGNOSIS — Z96611 Presence of right artificial shoulder joint: Secondary | ICD-10-CM | POA: Insufficient documentation

## 2018-10-02 DIAGNOSIS — I10 Essential (primary) hypertension: Secondary | ICD-10-CM | POA: Insufficient documentation

## 2018-10-02 DIAGNOSIS — Z955 Presence of coronary angioplasty implant and graft: Secondary | ICD-10-CM | POA: Diagnosis not present

## 2018-10-02 DIAGNOSIS — M1712 Unilateral primary osteoarthritis, left knee: Principal | ICD-10-CM | POA: Insufficient documentation

## 2018-10-02 DIAGNOSIS — Z79899 Other long term (current) drug therapy: Secondary | ICD-10-CM | POA: Diagnosis not present

## 2018-10-02 DIAGNOSIS — M6281 Muscle weakness (generalized): Secondary | ICD-10-CM | POA: Diagnosis not present

## 2018-10-02 DIAGNOSIS — Z96652 Presence of left artificial knee joint: Secondary | ICD-10-CM

## 2018-10-02 HISTORY — PX: TOTAL KNEE ARTHROPLASTY: SHX125

## 2018-10-02 LAB — TYPE AND SCREEN
ABO/RH(D): A POS
Antibody Screen: NEGATIVE

## 2018-10-02 LAB — GLUCOSE, CAPILLARY: Glucose-Capillary: 88 mg/dL (ref 70–99)

## 2018-10-02 SURGERY — ARTHROPLASTY, KNEE, TOTAL
Anesthesia: Spinal | Site: Knee | Laterality: Left

## 2018-10-02 MED ORDER — KETOROLAC TROMETHAMINE 30 MG/ML IJ SOLN
INTRAMUSCULAR | Status: DC | PRN
  Administered 2018-10-02: 30 mg

## 2018-10-02 MED ORDER — ONDANSETRON HCL 4 MG PO TABS: 4.0000 mg | ORAL_TABLET | Freq: Four times a day (QID) | ORAL | Status: DC | PRN

## 2018-10-02 MED ORDER — DOCUSATE SODIUM 100 MG PO CAPS: 100.0000 mg | ORAL_CAPSULE | Freq: Two times a day (BID) | ORAL | 0 refills | Status: DC

## 2018-10-02 MED ORDER — BUPIVACAINE HCL (PF) 0.25 % IJ SOLN
INTRAMUSCULAR | Status: AC
  Filled 2018-10-02: qty 30

## 2018-10-02 MED ORDER — DOCUSATE SODIUM 100 MG PO CAPS
100.0000 mg | ORAL_CAPSULE | Freq: Two times a day (BID) | ORAL | Status: DC
  Administered 2018-10-02 – 2018-10-03 (×2): 100 mg via ORAL
  Filled 2018-10-02 (×2): qty 1

## 2018-10-02 MED ORDER — METOCLOPRAMIDE HCL 5 MG PO TABS: 5.0000 mg | ORAL_TABLET | Freq: Three times a day (TID) | ORAL | Status: DC | PRN

## 2018-10-02 MED ORDER — DEXAMETHASONE SODIUM PHOSPHATE 10 MG/ML IJ SOLN
10.0000 mg | Freq: Once | INTRAMUSCULAR | Status: AC
  Administered 2018-10-03: 10 mg via INTRAVENOUS
  Filled 2018-10-02: qty 1

## 2018-10-02 MED ORDER — TRANEXAMIC ACID-NACL 1000-0.7 MG/100ML-% IV SOLN
1000.0000 mg | Freq: Once | INTRAVENOUS | Status: AC
  Administered 2018-10-02: 1000 mg via INTRAVENOUS
  Filled 2018-10-02: qty 100

## 2018-10-02 MED ORDER — BUPIVACAINE IN DEXTROSE 0.75-8.25 % IT SOLN
INTRATHECAL | Status: DC | PRN
  Administered 2018-10-02: 1.6 mL via INTRATHECAL

## 2018-10-02 MED ORDER — DIPHENHYDRAMINE HCL 12.5 MG/5ML PO ELIX: 12.5000 mg | ORAL_SOLUTION | ORAL | Status: DC | PRN

## 2018-10-02 MED ORDER — HYDROCODONE-ACETAMINOPHEN 7.5-325 MG PO TABS: 1.0000 | ORAL_TABLET | ORAL | 0 refills | Status: DC | PRN

## 2018-10-02 MED ORDER — ACETAMINOPHEN 325 MG PO TABS: 325.0000 mg | ORAL_TABLET | Freq: Four times a day (QID) | ORAL | Status: DC | PRN

## 2018-10-02 MED ORDER — POLYETHYLENE GLYCOL 3350 17 G PO PACK: 17.0000 g | PACK | Freq: Two times a day (BID) | ORAL | 0 refills | Status: DC

## 2018-10-02 MED ORDER — MIDAZOLAM HCL 2 MG/2ML IJ SOLN: 1.0000 mg | Freq: Once | INTRAMUSCULAR | Status: DC

## 2018-10-02 MED ORDER — HYDROMORPHONE HCL 1 MG/ML IJ SOLN: 0.5000 mg | INTRAMUSCULAR | Status: DC | PRN

## 2018-10-02 MED ORDER — HYDROCODONE-ACETAMINOPHEN 5-325 MG PO TABS
1.0000 | ORAL_TABLET | ORAL | Status: DC | PRN
  Administered 2018-10-03: 1 via ORAL
  Filled 2018-10-02: qty 1

## 2018-10-02 MED ORDER — MENTHOL 3 MG MT LOZG: 1.0000 | LOZENGE | OROMUCOSAL | Status: DC | PRN

## 2018-10-02 MED ORDER — PHENYLEPHRINE 40 MCG/ML (10ML) SYRINGE FOR IV PUSH (FOR BLOOD PRESSURE SUPPORT)
PREFILLED_SYRINGE | INTRAVENOUS | Status: AC
  Filled 2018-10-02: qty 20

## 2018-10-02 MED ORDER — ONDANSETRON HCL 4 MG/2ML IJ SOLN: 4.0000 mg | Freq: Once | INTRAMUSCULAR | Status: DC | PRN

## 2018-10-02 MED ORDER — PROPOFOL 500 MG/50ML IV EMUL
INTRAVENOUS | Status: DC | PRN
  Administered 2018-10-02: 30 mg via INTRAVENOUS

## 2018-10-02 MED ORDER — SODIUM CHLORIDE 0.9 % IR SOLN
Status: DC | PRN
  Administered 2018-10-02: 1000 mL

## 2018-10-02 MED ORDER — SODIUM CHLORIDE (PF) 0.9 % IJ SOLN
INTRAMUSCULAR | Status: DC | PRN
  Administered 2018-10-02: 10 mL

## 2018-10-02 MED ORDER — METHOCARBAMOL 500 MG PO TABS: 500.0000 mg | ORAL_TABLET | Freq: Four times a day (QID) | ORAL | 0 refills | Status: DC | PRN

## 2018-10-02 MED ORDER — NIACIN 500 MG PO TABS
500.0000 mg | ORAL_TABLET | Freq: Every day | ORAL | Status: DC
  Administered 2018-10-03: 500 mg via ORAL
  Filled 2018-10-02 (×2): qty 1

## 2018-10-02 MED ORDER — 0.9 % SODIUM CHLORIDE (POUR BTL) OPTIME
TOPICAL | Status: DC | PRN
  Administered 2018-10-02: 1000 mL

## 2018-10-02 MED ORDER — ACETAMINOPHEN 10 MG/ML IV SOLN: 1000.0000 mg | Freq: Once | INTRAVENOUS | Status: DC | PRN

## 2018-10-02 MED ORDER — ALUM & MAG HYDROXIDE-SIMETH 200-200-20 MG/5ML PO SUSP: 15.0000 mL | ORAL | Status: DC | PRN

## 2018-10-02 MED ORDER — FERROUS SULFATE 325 (65 FE) MG PO TABS
325.0000 mg | ORAL_TABLET | Freq: Two times a day (BID) | ORAL | Status: DC
  Administered 2018-10-03: 325 mg via ORAL
  Filled 2018-10-02: qty 1

## 2018-10-02 MED ORDER — METHOCARBAMOL 500 MG PO TABS
500.0000 mg | ORAL_TABLET | Freq: Four times a day (QID) | ORAL | Status: DC | PRN
  Administered 2018-10-03: 500 mg via ORAL
  Filled 2018-10-02: qty 1

## 2018-10-02 MED ORDER — POLYETHYLENE GLYCOL 3350 17 G PO PACK
17.0000 g | PACK | Freq: Two times a day (BID) | ORAL | Status: DC
  Administered 2018-10-02 – 2018-10-03 (×2): 17 g via ORAL
  Filled 2018-10-02 (×2): qty 1

## 2018-10-02 MED ORDER — TRANEXAMIC ACID-NACL 1000-0.7 MG/100ML-% IV SOLN
1000.0000 mg | INTRAVENOUS | Status: AC
  Administered 2018-10-02: 1000 mg via INTRAVENOUS
  Filled 2018-10-02: qty 100

## 2018-10-02 MED ORDER — FERROUS SULFATE 325 (65 FE) MG PO TABS: 325.0000 mg | ORAL_TABLET | Freq: Three times a day (TID) | ORAL | 3 refills | Status: DC

## 2018-10-02 MED ORDER — SODIUM CHLORIDE 0.9 % IV SOLN
INTRAVENOUS | Status: DC
  Administered 2018-10-02: 21:00:00 via INTRAVENOUS

## 2018-10-02 MED ORDER — CELECOXIB 200 MG PO CAPS
200.0000 mg | ORAL_CAPSULE | Freq: Two times a day (BID) | ORAL | Status: DC
  Administered 2018-10-02: 200 mg via ORAL
  Filled 2018-10-02: qty 1

## 2018-10-02 MED ORDER — PROPOFOL 10 MG/ML IV BOLUS
INTRAVENOUS | Status: AC
  Filled 2018-10-02: qty 120

## 2018-10-02 MED ORDER — KETOROLAC TROMETHAMINE 30 MG/ML IJ SOLN
INTRAMUSCULAR | Status: AC
  Filled 2018-10-02: qty 1

## 2018-10-02 MED ORDER — NON FORMULARY: 20.0000 mg | Freq: Every day | Status: DC

## 2018-10-02 MED ORDER — CHLORHEXIDINE GLUCONATE 4 % EX LIQD: 60.0000 mL | Freq: Once | CUTANEOUS | Status: DC

## 2018-10-02 MED ORDER — BISACODYL 10 MG RE SUPP: 10.0000 mg | Freq: Every day | RECTAL | Status: DC | PRN

## 2018-10-02 MED ORDER — METHOCARBAMOL 500 MG IVPB - SIMPLE MED
500.0000 mg | Freq: Four times a day (QID) | INTRAVENOUS | Status: DC | PRN
  Filled 2018-10-02: qty 50

## 2018-10-02 MED ORDER — CEFAZOLIN SODIUM-DEXTROSE 2-4 GM/100ML-% IV SOLN
2.0000 g | INTRAVENOUS | Status: AC
  Administered 2018-10-02: 2 g via INTRAVENOUS
  Filled 2018-10-02: qty 100

## 2018-10-02 MED ORDER — PHENYLEPHRINE HCL 10 MG/ML IJ SOLN
INTRAMUSCULAR | Status: DC | PRN
  Administered 2018-10-02: 120 ug via INTRAVENOUS
  Administered 2018-10-02: 80 ug via INTRAVENOUS
  Administered 2018-10-02 (×4): 120 ug via INTRAVENOUS

## 2018-10-02 MED ORDER — PROPOFOL 500 MG/50ML IV EMUL
INTRAVENOUS | Status: DC | PRN
  Administered 2018-10-02: 100 ug/kg/min via INTRAVENOUS

## 2018-10-02 MED ORDER — DEXAMETHASONE SODIUM PHOSPHATE 10 MG/ML IJ SOLN
10.0000 mg | Freq: Once | INTRAMUSCULAR | Status: AC
  Administered 2018-10-02: 10 mg via INTRAVENOUS

## 2018-10-02 MED ORDER — METOCLOPRAMIDE HCL 5 MG/ML IJ SOLN: 5.0000 mg | Freq: Three times a day (TID) | INTRAMUSCULAR | Status: DC | PRN

## 2018-10-02 MED ORDER — FENTANYL CITRATE (PF) 100 MCG/2ML IJ SOLN: 25.0000 ug | INTRAMUSCULAR | Status: DC | PRN

## 2018-10-02 MED ORDER — ATORVASTATIN CALCIUM 40 MG PO TABS
80.0000 mg | ORAL_TABLET | Freq: Every day | ORAL | Status: DC
  Administered 2018-10-02: 80 mg via ORAL
  Filled 2018-10-02: qty 2

## 2018-10-02 MED ORDER — ONDANSETRON HCL 4 MG/2ML IJ SOLN: 4.0000 mg | Freq: Four times a day (QID) | INTRAMUSCULAR | Status: DC | PRN

## 2018-10-02 MED ORDER — ONDANSETRON HCL 4 MG/2ML IJ SOLN
INTRAMUSCULAR | Status: DC | PRN
  Administered 2018-10-02: 4 mg via INTRAVENOUS

## 2018-10-02 MED ORDER — ESOMEPRAZOLE MAGNESIUM 20 MG PO CPDR
20.0000 mg | DELAYED_RELEASE_CAPSULE | Freq: Every day | ORAL | Status: DC
  Administered 2018-10-03: 20 mg via ORAL
  Filled 2018-10-02 (×2): qty 1

## 2018-10-02 MED ORDER — ASPIRIN 81 MG PO CHEW
81.0000 mg | CHEWABLE_TABLET | Freq: Two times a day (BID) | ORAL | Status: DC
  Administered 2018-10-03: 81 mg via ORAL
  Filled 2018-10-02: qty 1

## 2018-10-02 MED ORDER — LACTATED RINGERS IV SOLN
INTRAVENOUS | Status: DC
  Administered 2018-10-02 (×2): via INTRAVENOUS

## 2018-10-02 MED ORDER — FENTANYL CITRATE (PF) 100 MCG/2ML IJ SOLN
50.0000 ug | Freq: Once | INTRAMUSCULAR | Status: AC
  Administered 2018-10-02: 50 ug via INTRAVENOUS
  Filled 2018-10-02: qty 2

## 2018-10-02 MED ORDER — PHENOL 1.4 % MT LIQD: 1.0000 | OROMUCOSAL | Status: DC | PRN

## 2018-10-02 MED ORDER — FOLIC ACID 1 MG PO TABS
1.0000 mg | ORAL_TABLET | Freq: Every day | ORAL | Status: DC
  Administered 2018-10-03: 1 mg via ORAL
  Filled 2018-10-02: qty 1

## 2018-10-02 MED ORDER — CEFAZOLIN SODIUM-DEXTROSE 2-4 GM/100ML-% IV SOLN
2.0000 g | Freq: Four times a day (QID) | INTRAVENOUS | Status: AC
  Administered 2018-10-02 – 2018-10-03 (×2): 2 g via INTRAVENOUS
  Filled 2018-10-02 (×2): qty 100

## 2018-10-02 MED ORDER — NITROGLYCERIN 0.4 MG SL SUBL: 0.4000 mg | SUBLINGUAL_TABLET | SUBLINGUAL | Status: DC | PRN

## 2018-10-02 MED ORDER — HYDROCODONE-ACETAMINOPHEN 7.5-325 MG PO TABS
1.0000 | ORAL_TABLET | ORAL | Status: DC | PRN
  Administered 2018-10-03 (×2): 1 via ORAL
  Filled 2018-10-02 (×2): qty 1

## 2018-10-02 MED ORDER — ASPIRIN 81 MG PO CHEW: 81.0000 mg | CHEWABLE_TABLET | Freq: Two times a day (BID) | ORAL | 0 refills | Status: AC

## 2018-10-02 MED ORDER — SODIUM CHLORIDE (PF) 0.9 % IJ SOLN
INTRAMUSCULAR | Status: AC
  Filled 2018-10-02: qty 10

## 2018-10-02 MED ORDER — MAGNESIUM CITRATE PO SOLN: 1.0000 | Freq: Once | ORAL | Status: DC | PRN

## 2018-10-02 MED ORDER — STERILE WATER FOR IRRIGATION IR SOLN
Status: DC | PRN
  Administered 2018-10-02: 2000 mL

## 2018-10-02 MED ORDER — BUPIVACAINE HCL (PF) 0.25 % IJ SOLN
INTRAMUSCULAR | Status: DC | PRN
  Administered 2018-10-02: 30 mL

## 2018-10-02 MED ORDER — METOPROLOL TARTRATE 25 MG PO TABS
25.0000 mg | ORAL_TABLET | Freq: Two times a day (BID) | ORAL | Status: DC
  Administered 2018-10-02 – 2018-10-03 (×2): 25 mg via ORAL
  Filled 2018-10-02 (×2): qty 1

## 2018-10-02 SURGICAL SUPPLY — 63 items
ADH SKN CLS APL DERMABOND .7 (GAUZE/BANDAGES/DRESSINGS) ×1
ATTUNE MED ANAT PAT 38 KNEE (Knees) ×1 IMPLANT
ATTUNE PSFEM LTSZ6 NARCEM KNEE (Femur) ×1 IMPLANT
ATTUNE PSRP INSR SZ6 7 KNEE (Insert) ×1 IMPLANT
BAG SPEC THK2 15X12 ZIP CLS (MISCELLANEOUS)
BAG ZIPLOCK 12X15 (MISCELLANEOUS) IMPLANT
BANDAGE ACE 6X5 VEL STRL LF (GAUZE/BANDAGES/DRESSINGS) ×2 IMPLANT
BANDAGE ELASTIC 6 VELCRO ST LF (GAUZE/BANDAGES/DRESSINGS) ×1 IMPLANT
BASE TIBIA ATTUNE KNEE SYS SZ6 (Knees) IMPLANT
BLADE SAW SGTL 11.0X1.19X90.0M (BLADE) IMPLANT
BLADE SAW SGTL 13.0X1.19X90.0M (BLADE) ×2 IMPLANT
BLADE SURG SZ10 CARB STEEL (BLADE) ×4 IMPLANT
BOWL SMART MIX CTS (DISPOSABLE) ×2 IMPLANT
BSPLAT TIB 6 CMNT ROT PLAT STR (Knees) ×1 IMPLANT
CEMENT HV SMART SET (Cement) ×2 IMPLANT
COVER SURGICAL LIGHT HANDLE (MISCELLANEOUS) ×2 IMPLANT
COVER WAND RF STERILE (DRAPES) IMPLANT
CUFF TOURN SGL QUICK 34 (TOURNIQUET CUFF) ×2
CUFF TRNQT CYL 34X4.125X (TOURNIQUET CUFF) ×1 IMPLANT
DECANTER SPIKE VIAL GLASS SM (MISCELLANEOUS) ×4 IMPLANT
DERMABOND ADVANCED (GAUZE/BANDAGES/DRESSINGS) ×1
DERMABOND ADVANCED .7 DNX12 (GAUZE/BANDAGES/DRESSINGS) ×1 IMPLANT
DRAPE U-SHAPE 47X51 STRL (DRAPES) ×2 IMPLANT
DRESSING AQUACEL AG SP 3.5X10 (GAUZE/BANDAGES/DRESSINGS) ×1 IMPLANT
DRSG AQUACEL AG ADV 3.5X10 (GAUZE/BANDAGES/DRESSINGS) ×1 IMPLANT
DRSG AQUACEL AG SP 3.5X10 (GAUZE/BANDAGES/DRESSINGS) ×2
DURAPREP 26ML APPLICATOR (WOUND CARE) ×4 IMPLANT
ELECT REM PT RETURN 15FT ADLT (MISCELLANEOUS) ×2 IMPLANT
GLOVE BIO SURGEON STRL SZ 6 (GLOVE) ×2 IMPLANT
GLOVE BIOGEL PI IND STRL 6.5 (GLOVE) ×1 IMPLANT
GLOVE BIOGEL PI IND STRL 7.5 (GLOVE) ×1 IMPLANT
GLOVE BIOGEL PI IND STRL 8.5 (GLOVE) ×1 IMPLANT
GLOVE BIOGEL PI INDICATOR 6.5 (GLOVE) ×1
GLOVE BIOGEL PI INDICATOR 7.5 (GLOVE) ×1
GLOVE BIOGEL PI INDICATOR 8.5 (GLOVE) ×1
GLOVE ECLIPSE 8.0 STRL XLNG CF (GLOVE) ×2 IMPLANT
GLOVE ORTHO TXT STRL SZ7.5 (GLOVE) ×2 IMPLANT
GOWN STRL REUS W/ TWL LRG LVL3 (GOWN DISPOSABLE) ×1 IMPLANT
GOWN STRL REUS W/TWL 2XL LVL3 (GOWN DISPOSABLE) ×2 IMPLANT
GOWN STRL REUS W/TWL LRG LVL3 (GOWN DISPOSABLE) ×4 IMPLANT
HANDPIECE INTERPULSE COAX TIP (DISPOSABLE) ×2
HOLDER FOLEY CATH W/STRAP (MISCELLANEOUS) ×1 IMPLANT
MANIFOLD NEPTUNE II (INSTRUMENTS) ×2 IMPLANT
NDL SAFETY ECLIPSE 18X1.5 (NEEDLE) IMPLANT
NEEDLE HYPO 18GX1.5 SHARP (NEEDLE) ×2
NS IRRIG 1000ML POUR BTL (IV SOLUTION) ×2 IMPLANT
PACK TOTAL KNEE CUSTOM (KITS) ×2 IMPLANT
PIN FIX SIGMA HP QUICK REL (PIN) ×1 IMPLANT
PIN THREADED HEADED SIGMA (PIN) ×1 IMPLANT
PROTECTOR NERVE ULNAR (MISCELLANEOUS) ×2 IMPLANT
SET HNDPC FAN SPRY TIP SCT (DISPOSABLE) ×1 IMPLANT
SET PAD KNEE POSITIONER (MISCELLANEOUS) ×2 IMPLANT
SUT MNCRL AB 4-0 PS2 18 (SUTURE) ×2 IMPLANT
SUT STRATAFIX PDS+ 0 24IN (SUTURE) ×2 IMPLANT
SUT VIC AB 1 CT1 36 (SUTURE) ×2 IMPLANT
SUT VIC AB 2-0 CT1 27 (SUTURE) ×6
SUT VIC AB 2-0 CT1 TAPERPNT 27 (SUTURE) ×3 IMPLANT
SYR 3ML LL SCALE MARK (SYRINGE) ×2 IMPLANT
TIBIA ATTUNE KNEE SYS BASE SZ6 (Knees) ×2 IMPLANT
TRAY FOLEY MTR SLVR 16FR STAT (SET/KITS/TRAYS/PACK) ×2 IMPLANT
WATER STERILE IRR 1000ML POUR (IV SOLUTION) ×4 IMPLANT
WRAP KNEE MAXI GEL POST OP (GAUZE/BANDAGES/DRESSINGS) ×2 IMPLANT
YANKAUER SUCT BULB TIP 10FT TU (MISCELLANEOUS) ×2 IMPLANT

## 2018-10-02 NOTE — Op Note (Signed)
NAME:  [name] RECORD NO.:  [id number]                             FACILITY:  [facility]      PHYSICIAN:  Pietro Cassis. Alvan Dame, M.D.  DATE OF BIRTH:  [birth year]      DATE OF PROCEDURE:  10/02/2018                                     OPERATIVE REPORT         PREOPERATIVE DIAGNOSIS:  Left knee osteoarthritis.      POSTOPERATIVE DIAGNOSIS:  Left knee osteoarthritis.      FINDINGS:  The patient was noted to have complete loss of cartilage and   bone-on-bone arthritis with associated osteophytes in the medial and patellofemoral compartments of   the knee with a significant synovitis and associated effusion.  The patient had failed months of conservative treatment including medications, injection therapy, activity modification.     PROCEDURE:  Left total knee replacement.      COMPONENTS USED:  DePuy Attune rotating platform posterior stabilized knee   system, a size 6N femur, 6 tibia, size 7 mm PS AOX insert, and 38 anatomic patellar   button.      SURGEON:  Pietro Cassis. Alvan Dame, M.D.      ASSISTANT:  Griffith Citron, PA-C.      ANESTHESIA:  Regional and Spinal.      SPECIMENS:  None.      COMPLICATION:  None.      DRAINS:  None.  EBL: <150cc      TOURNIQUET TIME:  28 min at 250 mmHg     The patient was stable to the recovery room.      INDICATION FOR PROCEDURE:  [name] is a 83 y.o. male patient of   mine.  The patient had been seen, evaluated, and treated for months conservatively in the   office with medication, activity modification, and injections.  The patient had   radiographic changes of bone-on-bone arthritis with endplate sclerosis and osteophytes noted.  Based on the radiographic changes and failed conservative measures, the patient   decided to proceed with definitive treatment, total knee replacement.  Risks of infection, DVT, component failure, need for revision surgery, neurovascular injury were reviewed in the office setting.  The  postop course was reviewed stressing the efforts to maximize post-operative satisfaction and function.  Consent was obtained for benefit of pain   relief.      PROCEDURE IN DETAIL:  The patient was brought to the operative theater.   Once adequate anesthesia, preoperative antibiotics, 2 gm of Ancef,1 gm of Tranexamic Acid, and 10 mg of Decadron administered, the patient was positioned supine with a left thigh tourniquet placed.  The  left lower extremity was prepped and draped in sterile fashion.  A time-   out was performed identifying the patient, planned procedure, and the appropriate extremity.      The left lower extremity was placed in the [facility] leg holder.  The leg was   exsanguinated, tourniquet elevated to 250 mmHg.  A midline incision was   made followed by median parapatellar arthrotomy.  Following initial   exposure, attention was first  directed to the patella.  Precut   measurement was noted to be 23 mm.  I resected down to 14 mm and used a   38 anatomic patellar button to restore patellar height as well as cover the cut surface.      The lug holes were drilled and a metal shim was placed to protect the   patella from retractors and saw blade during the procedure.      At this point, attention was now directed to the femur.  The femoral   canal was opened with a drill, irrigated to try to prevent fat emboli.  An   intramedullary rod was passed at 5 degrees valgus, 9 mm of bone was   resected off the distal femur.  Following this resection, the tibia was   subluxated anteriorly.  Using the extramedullary guide, 2 mm of bone was resected off   the proximal medial tibia.  We confirmed the gap would be   stable medially and laterally with a size 5 spacer block as well as confirmed that the tibial cut was perpendicular in the coronal plane, checking with an alignment rod.      Once this was done, I sized the femur to be a size 6 in the anterior-   posterior dimension, chose a  narrow component based on medial and   lateral dimension.  The size 6 rotation block was then pinned in   position anterior referenced using the C-clamp to set rotation.  The   anterior, posterior, and  chamfer cuts were made without difficulty nor   notching making certain that I was along the anterior cortex to help   with flexion gap stability.      The final box cut was made off the lateral aspect of distal femur.      At this point, the tibia was sized to be a size 6.  The size 6 tray was   then pinned in position through the medial third of the tubercle,   drilled, and keel punched.  Trial reduction was now carried with a 6 femur,  6 tibia, a size 6 then 7 mm PS insert, and the 38 anatomic patella botton.  The knee was brought to full extension with good flexion stability with the patella   tracking through the trochlea without application of pressure.  Given   all these findings the trial components removed.  Final components were   opened and cement was mixed.  The knee was irrigated with normal saline solution and pulse lavage.  The synovial lining was   then injected with 30 cc of 0.25% Marcaine with epinephrine, 1 cc of Toradol and 30 cc of NS for a total of 61 cc.     Final implants were then cemented onto cleaned and dried cut surfaces of bone with the knee brought to extension with a size 7 mm PS trial insert.      Once the cement had fully cured, excess cement was removed   throughout the knee.  I confirmed that I was satisfied with the range of   motion and stability, and the final size 7 mm PS AOX insert was chosen.  It was   placed into the knee.      The tourniquet had been let down at 28 minutes.  No significant   hemostasis was required.  The extensor mechanism was then reapproximated using #1 Vicryl and #1 Stratafix sutures with the knee   in flexion.  The  remaining wound was closed with 2-0 Vicryl and running 4-0 Monocryl.   The knee was cleaned, dried, dressed  sterilely using Dermabond and   Aquacel dressing.  The patient was then   brought to recovery room in stable condition, tolerating the procedure   well.   Please note that Physician Assistant, Griffith Citron, PA-C was present for the entirety of the case, and was utilized for pre-operative positioning, peri-operative retractor management, general facilitation of the procedure and for primary wound closure at the end of the case.              Pietro Cassis Alvan Dame, M.D.    10/02/2018 4:39 PM

## 2018-10-02 NOTE — Progress Notes (Signed)
AssistedDr. Houser with left, ultrasound guided, adductor canal block. Side rails up, monitors on throughout procedure. See vital signs in flow sheet. Tolerated Procedure well.  

## 2018-10-02 NOTE — Discharge Instructions (Signed)

## 2018-10-02 NOTE — Anesthesia Procedure Notes (Signed)
Spinal  Patient location during procedure: OR Start time: 10/02/2018 4:40 PM End time: 10/02/2018 4:45 PM Staffing Anesthesiologist: Barnet Glasgow, MD Preanesthetic Checklist Completed: patient identified, site marked, surgical consent, pre-op evaluation, timeout performed, IV checked, risks and benefits discussed and monitors and equipment checked Spinal Block Patient position: sitting Prep: DuraPrep Patient monitoring: heart rate, cardiac monitor, continuous pulse ox and blood pressure Approach: midline Location: L3-4 Injection technique: single-shot Needle Needle type: Sprotte  Needle gauge: 24 G Needle length: 9 cm Needle insertion depth: 7 cm Assessment Sensory level: T4 Additional Notes 1 attempt pt tolerated procedure well

## 2018-10-02 NOTE — Anesthesia Postprocedure Evaluation (Signed)
Anesthesia Post Note  Patient: [name]  Procedure(s) Performed: TOTAL LEFT  KNEE ARTHROPLASTY (Left Knee)     Patient location during evaluation: PACU Anesthesia Type: Spinal Level of consciousness: sedated Pain management: pain level controlled Vital Signs Assessment: post-procedure vital signs reviewed and stable Respiratory status: spontaneous breathing Postop Assessment: no backache, spinal receding, no headache and no apparent nausea or vomiting Anesthetic complications: no    Last Vitals:  Vitals:   10/02/18 1915 10/02/18 1930  BP: 109/62 113/66  Pulse: (!) 57 63  Resp: 19 18  Temp:    SpO2: 99% 100%    Last Pain:  Vitals:   10/02/18 1930  TempSrc:   PainSc: 0-No pain   Pain Goal: Patients Stated Pain Goal: 4 (10/02/18 1345)  LLE Motor Response: No movement due to regional block (10/02/18 1930) LLE Sensation: Numbness, Tingling, No pain (10/02/18 1930) RLE Motor Response: No movement due to regional block (10/02/18 1930) RLE Sensation: Numbness, Tingling, No pain (10/02/18 1930) L Sensory Level: L2-Upper inner thigh, upper buttock (10/02/18 1930) R Sensory Level: L3-Anterior knee, lower leg (10/02/18 1930) Epidural/Spinal Function Cutaneous sensation: Able to Discern Pressure (10/02/18 1930), Patient able to flex knees: No (10/02/18 1930), Patient able to lift hips off bed: No (10/02/18 1930), Back pain beyond tenderness at insertion site: No (10/02/18 1930), Progressively worsening motor and/or sensory loss: No (10/02/18 1930), Bowel and/or bladder incontinence post epidural: No (10/02/18 1930)  Huston Foley

## 2018-10-02 NOTE — Interval H&P Note (Signed)
History and Physical Interval Note:  10/02/2018 3:51 PM  [name]  has presented today for surgery, with the diagnosis of LEFT knee osteoarthritis  The various methods of treatment have been discussed with the patient and family. After consideration of risks, benefits and other options for treatment, the patient has consented to  Procedure(s) with comments: TOTAL LEFT  KNEE ARTHROPLASTY (Left) - 90 mins- Ok per [employer] as a surgical intervention .  The patient's history has been reviewed, patient examined, no change in status, stable for surgery.  I have reviewed the patient's chart and labs.  Questions were answered to the patient's satisfaction.     [name]

## 2018-10-02 NOTE — Transfer of Care (Signed)
Immediate Anesthesia Transfer of Care Note  Patient: [name]  Procedure(s) Performed: TOTAL LEFT  KNEE ARTHROPLASTY (Left Knee)  Patient Location: PACU  Anesthesia Type:Spinal  Level of Consciousness: sedated, patient cooperative and responds to stimulation  Airway & Oxygen Therapy: Patient Spontanous Breathing and Patient connected to face mask oxygen  Post-op Assessment: Report given to RN and Post -op Vital signs reviewed and stable  Post vital signs: Reviewed and stable  Last Vitals:  Vitals Value Taken Time  BP    Temp    Pulse    Resp    SpO2      Last Pain:  Vitals:   10/02/18 1345  TempSrc:   PainSc: 1       Patients Stated Pain Goal: 4 (83/67/25 5001)  Complications: No apparent anesthesia complications

## 2018-10-03 ENCOUNTER — Other Ambulatory Visit: Payer: Self-pay

## 2018-10-03 ENCOUNTER — Encounter (HOSPITAL_COMMUNITY): Payer: Self-pay

## 2018-10-03 DIAGNOSIS — E663 Overweight: Secondary | ICD-10-CM | POA: Diagnosis present

## 2018-10-03 DIAGNOSIS — M1712 Unilateral primary osteoarthritis, left knee: Secondary | ICD-10-CM | POA: Diagnosis not present

## 2018-10-03 LAB — BASIC METABOLIC PANEL
Anion gap: 8 (ref 5–15)
BUN: 22 mg/dL (ref 8–23)
CO2: 21 mmol/L — ABNORMAL LOW (ref 22–32)
Calcium: 8.4 mg/dL — ABNORMAL LOW (ref 8.9–10.3)
Chloride: 109 mmol/L (ref 98–111)
Creatinine, Ser: 1.41 mg/dL — ABNORMAL HIGH (ref 0.61–1.24)
GFR calc Af Amer: 54 mL/min — ABNORMAL LOW (ref >60)
GFR calc non Af Amer: 47 mL/min — ABNORMAL LOW (ref >60)
Glucose, Bld: 187 mg/dL — ABNORMAL HIGH (ref 70–99)
Potassium: 3.7 mmol/L (ref 3.5–5.1)
SODIUM: 138 mmol/L (ref 135–145)

## 2018-10-03 LAB — CBC
HCT: 34.1 % — ABNORMAL LOW (ref 39.0–52.0)
Hemoglobin: 10.7 g/dL — ABNORMAL LOW (ref 13.0–17.0)
MCH: 30.8 pg (ref 26.0–34.0)
MCHC: 31.4 g/dL (ref 30.0–36.0)
MCV: 98.3 fL (ref 80.0–100.0)
NRBC: 0 % (ref 0.0–0.2)
Platelets: 113 10*3/uL — ABNORMAL LOW (ref 150–400)
RBC: 3.47 MIL/uL — AB (ref 4.22–5.81)
RDW: 13.4 % (ref 11.5–15.5)
WBC: 9.5 10*3/uL (ref 4.0–10.5)

## 2018-10-03 NOTE — Evaluation (Signed)
Physical Therapy Evaluation Patient Details Name: [name] MRN: [id number] DOB: June 30, 1936 Today's Date: 10/03/2018   History of Present Illness  Patient is an 83 y/o male s/p L TKA on 10/02/2018. PMH signficant for CAD, HTN, MI.  Clinical Impression  Patient admitted s/p above listed procedure. Patient reports IND with mobility and ADLs prior to admission. Patient today requiring use of RW with light Min A to stand from recliner with cueing for hand placement and safety. Patient with good tolerance to mobility, however reports increased "tightness" with progressive distances. Will plan to see patient for one additional session to review exercise program and stair navigation with wife.     Follow Up Recommendations Follow surgeon's recommendation for DC plan and follow-up therapies    Equipment Recommendations  None recommended by PT    Recommendations for Other Services       Precautions / Restrictions Precautions Precautions: Fall;Knee Precaution Comments: reviewed no pillow under knee Restrictions Weight Bearing Restrictions: No Other Position/Activity Restrictions: WBAT L LE      Mobility  Bed Mobility               General bed mobility comments: up in recliner  Transfers Overall transfer level: Needs assistance Equipment used: Rolling walker (2 wheeled) Transfers: Sit to/from [facility] Sit to Stand: Min assist Stand pivot transfers: Min guard       General transfer comment: Min A to stand from recliner; cueing for hand placement  Ambulation/Gait Ambulation/Gait assistance: Min guard Gait Distance (Feet): 250 Feet Assistive device: Rolling walker (2 wheeled) Gait Pattern/deviations: Step-to pattern;Step-through pattern;Decreased stride length;Decreased weight shift to left;Antalgic Gait velocity: decreased   General Gait Details: mildly antalgic gait pattern with reduced weight shift to L LE; reports tightness with  ambulation  Stairs Stairs: Yes Stairs assistance: Min guard;Min assist Stair Management: No rails;Step to pattern;Backwards;With walker Number of Stairs: 3 General stair comments: Min A for safety and RW setup - will plan to review this again with wife present  Wheelchair Mobility    Modified Rankin (Stroke Patients Only)       Balance Overall balance assessment: Mild deficits observed, not formally tested                                           Pertinent Vitals/Pain Pain Assessment: 0-10 Pain Score: 4  Pain Location: L knee Pain Descriptors / Indicators: Aching;Guarding;Tightness Pain Intervention(s): Limited activity within patient's tolerance;Monitored during session;Repositioned;Premedicated before session;Ice applied    Home Living Family/patient expects to be discharged to:: Private residence Living Arrangements: Spouse/significant other Available Help at Discharge: Family;Available 24 hours/day Type of Home: House Home Access: Stairs to enter Entrance Stairs-Rails: None Entrance Stairs-Number of Steps: 2 Home Layout: One level Home Equipment: Walker - 2 wheels;Toilet riser;Cane - single point;Walker - 4 wheels;Bedside commode;Shower seat      Prior Function Level of Independence: Independent               Hand Dominance        Extremity/Trunk Assessment   Upper Extremity Assessment Upper Extremity Assessment: Overall WFL for tasks assessed    Lower Extremity Assessment Lower Extremity Assessment: Generalized weakness;LLE deficits/detail LLE Deficits / Details: expected post-op pain and weakness    Cervical / Trunk Assessment Cervical / Trunk Assessment: Normal  Communication   Communication: No difficulties  Cognition Arousal/Alertness: Awake/alert Behavior During Therapy:  WFL for tasks assessed/performed Overall Cognitive Status: Within Functional Limits for tasks assessed                                         General Comments General comments (skin integrity, edema, etc.): wife present and supportive    Exercises Total Joint Exercises Ankle Circles/Pumps: AROM;Both;10 reps;Seated Quad Sets: AROM;Left;5 reps;Seated Long Arc Quad: AROM;Left;5 reps;Seated Goniometric ROM: ~5-90   Assessment/Plan    PT Assessment Patient needs continued PT services  PT Problem List Decreased strength;Decreased range of motion;Decreased activity tolerance;Decreased balance;Decreased mobility;Decreased knowledge of use of DME;Decreased safety awareness       PT Treatment Interventions DME instruction;Gait training;Stair training;Functional mobility training;Therapeutic activities;Therapeutic exercise;Balance training;Patient/family education    PT Goals (Current goals can be found in the Care Plan section)  Acute Rehab PT Goals Patient Stated Goal: return home today PT Goal Formulation: With patient Time For Goal Achievement: 10/17/18 Potential to Achieve Goals: Good    Frequency 7X/week   Barriers to discharge        Co-evaluation               AM-PAC PT "6 Clicks" Mobility  Outcome Measure Help needed turning from your back to your side while in a flat bed without using bedrails?: A Little Help needed moving from lying on your back to sitting on the side of a flat bed without using bedrails?: A Little Help needed moving to and from a bed to a chair (including a wheelchair)?: A Little Help needed standing up from a chair using your arms (e.g., wheelchair or bedside chair)?: A Little Help needed to walk in hospital room?: A Little Help needed climbing 3-5 steps with a railing? : A Little 6 Click Score: 18    End of Session Equipment Utilized During Treatment: Gait belt Activity Tolerance: Patient tolerated treatment well Patient left: in chair;with call bell/phone within reach;with family/visitor present Nurse Communication: Mobility status PT Visit Diagnosis: Unsteadiness on  feet (R26.81);Other abnormalities of gait and mobility (R26.89);Muscle weakness (generalized) (M62.81)    Time: 9794-8016 PT Time Calculation (min) (ACUTE ONLY): 20 min   Charges:   PT Evaluation $PT Eval Moderate Complexity: 1 Mod          Lanney Gins, PT, DPT Supplemental Physical Therapist 10/03/18 9:35 AM Pager: [phone] Office: [phone]

## 2018-10-03 NOTE — Progress Notes (Signed)
     Subjective: 1 Day Post-Op Procedure(s) (LRB): TOTAL LEFT  KNEE ARTHROPLASTY (Left)   Patient reports pain as mild, pain controlled. No events throughout the night. Dr. Alvan Dame discussed the procedure and post-op expectations. Ready to be discharged home if he does well with PT.    Patient's anticipated LOS is less than 2 midnights, meeting these requirements: - Lives within 1 hour of care - Has a competent adult at home to recover with post-op recover - NO history of  - Chronic pain requiring opiods  - Diabetes  - Coronary Artery Disease  - Heart failure  - Heart attack  - Stroke  - DVT/VTE  - Cardiac arrhythmia  - Respiratory Failure/COPD  - Renal failure  - Anemia  - Advanced Liver disease    Objective:   VITALS:   Vitals:   10/03/18 0353 10/03/18 0538  BP: 124/69 (!) 114/55  Pulse: 65 68  Resp:  18  Temp:  98.6 F (37 C)  SpO2:  95%    Dorsiflexion/Plantar flexion intact Incision: dressing C/D/I No cellulitis present Compartment soft  LABS Recent Labs    10/03/18 0543  HGB 10.7*  HCT 34.1*  WBC 9.5  PLT 113*    Recent Labs    10/03/18 0543  NA 138  K 3.7  BUN 22  CREATININE 1.41*  GLUCOSE 187*     Assessment/Plan: 1 Day Post-Op Procedure(s) (LRB): TOTAL LEFT  KNEE ARTHROPLASTY (Left) Foley cath d/c'ed Advance diet Up with therapy D/C IV fluids Discharge home Follow up in 2 weeks at [facility] Follow up with OLIN,Neel Buffone D in 2 weeks.  Contact information:  [facility]) [address] [place] [phone]    Overweight (BMI 25-29.9) Estimated body mass index is 27.3 kg/m as calculated from the following:   Height as of 09/28/18: 5\' 8"  (1.727 m).   Weight as of 09/28/18: 81.4 kg. Patient also counseled that weight may inhibit the healing process Patient counseled that losing weight will help with future health issues       West Pugh. Niccole Witthuhn   PAC  10/03/2018, 7:54 AM

## 2018-10-03 NOTE — Care Management Obs Status (Signed)
MEDICARE OBSERVATION STATUS NOTIFICATION   Patient Details  Name: [name] MRN: [id number] Date of Birth: 04-21-1936   Medicare Observation Status Notification Given:  Yes    Leeroy Cha, RN 10/03/2018, 11:14 AM

## 2018-10-03 NOTE — Plan of Care (Signed)
°  Problem: Education: °Goal: Knowledge of the prescribed therapeutic regimen will improve °Outcome: Progressing °  °Problem: Clinical Measurements: °Goal: Postoperative complications will be avoided or minimized °Outcome: Progressing °  °Problem: Pain Management: °Goal: Pain level will decrease with appropriate interventions °Outcome: Progressing °  °

## 2018-10-03 NOTE — Care Management Note (Addendum)
Case Management Note  Patient Details  Name: [name] MRN: [id number] Date of Birth: 1936-03-21  Subjective/Objective:                  discharge  Action/Plan: dme-has equip at home hhc- for P..T Expected Discharge Date:  10/03/18               Expected Discharge Plan:  [place]  In-House Referral:    [facility] for P.T. Discharge planning Services  CM Consult  Post Acute Care Choice:  Home Health, Durable Medical Equipment Choice offered to:     DME Arranged:  3-N-1, Walker rolling DME Agency:  [facility]  HH Arranged:  PT [place] Agency:  [facility] (formerly [employer])  Status of Service:  Completed, signed off  If discussed at [employer], dates discussed:    Additional Comments:  Leeroy Cha, RN 10/03/2018, 10:19 AM

## 2018-10-03 NOTE — Progress Notes (Signed)
Physical Therapy Treatment Patient Details Name: [name] MRN: [id number] DOB: 28-May-1936 Today's Date: 10/03/2018    History of Present Illness Patient is an 83 y/o male s/p L TKA on 10/02/2018. PMH signficant for CAD, HTN, MI.    PT Comments    Patient seen for mobility progress, exercise instruction, and stair training with patient and wife. Patient performing all mobility with min guard/supervision for safety - no physical assist needed for mobility. Patient able to perform all HEP activities with good tolerance and control. From a mobility standpoint patient making excellent progress and ready for d/c home once medically appropriate.     Follow Up Recommendations  Follow surgeon's recommendation for DC plan and follow-up therapies     Equipment Recommendations  None recommended by PT    Recommendations for Other Services       Precautions / Restrictions Precautions Precautions: Fall;Knee Precaution Comments: reviewed no pillow under knee Restrictions Weight Bearing Restrictions: No Other Position/Activity Restrictions: WBAT L LE    Mobility  Bed Mobility               General bed mobility comments: up in recliner  Transfers Overall transfer level: Needs assistance Equipment used: Rolling walker (2 wheeled) Transfers: Sit to/from [facility] Sit to Stand: Min guard Stand pivot transfers: Min guard       General transfer comment: min guard for transfers for safety - no physical assist provided  Ambulation/Gait Ambulation/Gait assistance: Min guard;Supervision Gait Distance (Feet): 300 Feet Assistive device: Rolling walker (2 wheeled) Gait Pattern/deviations: Step-to pattern;Step-through pattern;Decreased stride length;Decreased weight shift to left;Antalgic Gait velocity: decreased   General Gait Details: able to progress to step through pattern; cueing to increase heel strike for normalized gait pattern   Stairs Stairs:  Yes Stairs assistance: Min guard;Supervision Stair Management: No rails;Step to pattern;Backwards;With walker Number of Stairs: 3 General stair comments: wife present with pateint able to cue wife on how to safely assist   Wheelchair Mobility    Modified Rankin (Stroke Patients Only)       Balance Overall balance assessment: Mild deficits observed, not formally tested                                          Cognition Arousal/Alertness: Awake/alert Behavior During Therapy: WFL for tasks assessed/performed Overall Cognitive Status: Within Functional Limits for tasks assessed                                        Exercises Total Joint Exercises Ankle Circles/Pumps: AROM;Both;10 reps;Seated Quad Sets: AROM;Left;10 reps;Seated Heel Slides: AROM;Left;10 reps;Seated Hip ABduction/ADduction: AROM;Left;10 reps;Seated Straight Leg Raises: AROM;Left;10 reps;Seated Long Arc Quad: AROM;Left;10 reps;Seated    General Comments        Pertinent Vitals/Pain Pain Assessment: 0-10 Pain Score: 3  Pain Location: L knee Pain Descriptors / Indicators: Aching;Guarding;Tightness Pain Intervention(s): Limited activity within patient's tolerance;Monitored during session;Patient requesting pain meds-RN notified    Home Living                      Prior Function            PT Goals (current goals can now be found in the care plan section) Acute Rehab PT Goals Patient Stated Goal: return home today PT  Goal Formulation: With patient Time For Goal Achievement: 10/17/18 Potential to Achieve Goals: Good    Frequency    7X/week      PT Plan Current plan remains appropriate    Co-evaluation              AM-PAC PT "6 Clicks" Mobility   Outcome Measure  Help needed turning from your back to your side while in a flat bed without using bedrails?: A Little Help needed moving from lying on your back to sitting on the side of a flat  bed without using bedrails?: A Little Help needed moving to and from a bed to a chair (including a wheelchair)?: A Little Help needed standing up from a chair using your arms (e.g., wheelchair or bedside chair)?: A Little Help needed to walk in hospital room?: A Little Help needed climbing 3-5 steps with a railing? : A Little 6 Click Score: 18    End of Session Equipment Utilized During Treatment: Gait belt Activity Tolerance: Patient tolerated treatment well Patient left: in chair;with call bell/phone within reach;with family/visitor present Nurse Communication: Mobility status PT Visit Diagnosis: Unsteadiness on feet (R26.81);Other abnormalities of gait and mobility (R26.89);Muscle weakness (generalized) (M62.81)     Time: 9450-3888 PT Time Calculation (min) (ACUTE ONLY): 27 min  Charges:  $Gait Training: 8-22 mins $Therapeutic Exercise: 8-22 mins                     Lanney Gins, PT, DPT Supplemental Physical Therapist 10/03/18 1:26 PM Pager: [phone] Office: [phone]

## 2018-10-06 DIAGNOSIS — R2689 Other abnormalities of gait and mobility: Secondary | ICD-10-CM | POA: Diagnosis not present

## 2018-10-06 DIAGNOSIS — Z96652 Presence of left artificial knee joint: Secondary | ICD-10-CM | POA: Diagnosis not present

## 2018-10-06 DIAGNOSIS — M25662 Stiffness of left knee, not elsewhere classified: Secondary | ICD-10-CM | POA: Diagnosis not present

## 2018-10-06 DIAGNOSIS — M25562 Pain in left knee: Secondary | ICD-10-CM | POA: Diagnosis not present

## 2018-10-06 NOTE — Discharge Summary (Signed)
Physician Discharge Summary  Patient ID: [name] MRN: [id number] DOB/AGE: 81/31/1937 81 y.o.  Admit date: 10/02/2018 Discharge date: 10/03/2018   Procedures:  Procedure(s) (LRB): TOTAL LEFT  KNEE ARTHROPLASTY (Left)  Attending Physician:  Dr. Paralee Cancel   Admission Diagnoses:   Left knee primary OA / pain  Discharge Diagnoses:  Principal Problem:   S/P left TKA Active Problems:   Overweight (BMI 25.0-29.9)  Past Medical History:  Diagnosis Date  . Arthritis    knees,back  . CAD (coronary artery disease) CARDIOLOGIST-  DR HOCHREIN   previous myocardial infarction in 2001 with 90% circumflex lesion treated with PTCA. The LAD had 70-80% stenosis which was treawted with angioplasty. Most recent catheterization in 2003 demonstrated the LAD at 30% mid stenosis, circumflex 60-70% stenosis in the mid AV grove, and 40% stenosis in the previously stented area. The right coronary artery had 30-40% stenosis in the mid portion.   Marland Kitchen GERD (gastroesophageal reflux disease)   . Heart murmur   . History of kidney stones   . Hypertension   . Left ureteral stone   . Myocardial infarction ([place])   . Pre-diabetes   . Skin cancer    face    HPI:    [name], 83 y.o. male, has a history of pain and functional disability in the left knee due to arthritis and has failed non-surgical conservative treatments for greater than 12 weeks to includecorticosteriod injections, viscosupplementation injections and activity modification.  Onset of symptoms was gradual, starting 5 years ago with gradually worsening course since that time. The patient noted prior procedures on the knee to include  arthroscopy on the left knee(s).  Patient currently rates pain in the left knee(s) at 7 out of 10 with activity. Patient has worsening of pain with activity and weight bearing, pain that interferes with activities of daily living and joint swelling.  Patient has evidence of no fracture, no dislocation and  bilateral bone on bone degenerative changes with complete loss of joint space and periarticular osteophytes and cystic changes mainly in the medial with moderate patellofemoral changes identified. This is consistent with both knees. by imaging studies. There is no active infection.  PCP: Shirline Frees, MD   Discharged Condition: good  Hospital Course:  Patient underwent the above stated procedure on 10/02/2018. Patient tolerated the procedure well and brought to the recovery room in good condition and subsequently to the floor.  POD #1 BP: 114/55 ; Pulse: 68 ; Temp: 98.6 F (37 C) ; Resp: 18 Patient reports pain as mild, pain controlled. No events throughout the night. Dr. Alvan Dame discussed the procedure and post-op expectations. Ready to be discharged home. Dorsiflexion/plantar flexion intact, incision: dressing C/D/I, no cellulitis present and compartment soft.   LABS  Basename    HGB     10.7  HCT     34.1    Discharge Exam: General appearance: alert, cooperative and no distress Extremities: Homans sign is negative, no sign of DVT, no edema, redness or tenderness in the calves or thighs and no ulcers, gangrene or trophic changes  Disposition:  Home with follow up in 2 weeks   Follow-up Information    Paralee Cancel, MD. Schedule an appointment as soon as possible for a visit in 2 weeks.   Specialty:  Orthopedic Surgery Contact information: [address] [place] [address] [phone]           Discharge Instructions    Call MD / Call 911  Complete by:  As directed    If you experience chest pain or shortness of breath, CALL 911 and be transported to the hospital emergency room.  If you develope a fever above 101 F, pus (white drainage) or increased drainage or redness at the wound, or calf pain, call your surgeon's office.   Change dressing   Complete by:  As directed    Maintain surgical dressing until follow up in the clinic. If the edges start to  pull up, may reinforce with tape. If the dressing is no longer working, may remove and cover with gauze and tape, but must keep the area dry and clean.  Call with any questions or concerns.   Constipation Prevention   Complete by:  As directed    Drink plenty of fluids.  Prune juice may be helpful.  You may use a stool softener, such as Colace (over the counter) 100 mg twice a day.  Use MiraLax (over the counter) for constipation as needed.   Diet - low sodium heart healthy   Complete by:  As directed    Discharge instructions   Complete by:  As directed    Maintain surgical dressing until follow up in the clinic. If the edges start to pull up, may reinforce with tape. If the dressing is no longer working, may remove and cover with gauze and tape, but must keep the area dry and clean.  Follow up in 2 weeks at [facility]. Call with any questions or concerns.   Increase activity slowly as tolerated   Complete by:  As directed    Weight bearing as tolerated with assist device (walker, cane, etc) as directed, use it as long as suggested by your surgeon or therapist, typically at least 4-6 weeks.   TED hose   Complete by:  As directed    Use stockings (TED hose) for 2 weeks on both leg(s).  You may remove them at night for sleeping.      Allergies as of 10/03/2018   No Known Allergies     Medication List    STOP taking these medications   acetaminophen 650 MG CR tablet Commonly known as:  TYLENOL   aspirin 81 MG EC tablet Replaced by:  aspirin 81 MG chewable tablet     TAKE these medications   aspirin 81 MG chewable tablet Commonly known as:  Aspirin Childrens Chew 1 tablet (81 mg total) by mouth 2 (two) times daily for 30 days. Take for 4 weeks, then resume regular dose. Replaces:  aspirin 81 MG EC tablet   atorvastatin 80 MG tablet Commonly known as:  LIPITOR Take 1 tablet (80 mg total) by mouth at bedtime.   docusate sodium 100 MG capsule Commonly known as:   Colace Take 1 capsule (100 mg total) by mouth 2 (two) times daily.   ferrous sulfate 325 (65 FE) MG tablet Commonly known as:  FerrouSul Take 1 tablet (325 mg total) by mouth 3 (three) times daily with meals. What changed:  when to take this   folic acid 706 MCG tablet Commonly known as:  FOLVITE Take 800 mcg by mouth daily.   HYDROcodone-acetaminophen 7.5-325 MG tablet Commonly known as:  Norco Take 1-2 tablets by mouth every 4 (four) hours as needed for moderate pain.   methocarbamol 500 MG tablet Commonly known as:  Robaxin Take 1 tablet (500 mg total) by mouth every 6 (six) hours as needed for muscle spasms.   metoprolol tartrate 25 MG tablet  Commonly known as:  LOPRESSOR Take 1 tablet (25 mg total) by mouth 2 (two) times daily.   NexIUM 20 MG capsule Generic drug:  esomeprazole Take 20 mg by mouth daily before breakfast.   niacin 500 MG tablet Take 500 mg by mouth daily.   nitroGLYCERIN 0.4 MG SL tablet Commonly known as:  NITROSTAT Place 0.4 mg under the tongue every 5 (five) minutes as needed for chest pain.   polyethylene glycol packet Commonly known as:  MIRALAX / GLYCOLAX Take 17 g by mouth 2 (two) times daily.            Discharge Care Instructions  (From admission, onward)         Start     Ordered   10/03/18 0000  Change dressing    Comments:  Maintain surgical dressing until follow up in the clinic. If the edges start to pull up, may reinforce with tape. If the dressing is no longer working, may remove and cover with gauze and tape, but must keep the area dry and clean.  Call with any questions or concerns.   10/03/18 0757           Signed: West Pugh. Chantilly Linskey   PA-C  10/06/2018, 10:08 AM

## 2018-10-10 DIAGNOSIS — R2689 Other abnormalities of gait and mobility: Secondary | ICD-10-CM | POA: Diagnosis not present

## 2018-10-10 DIAGNOSIS — M25662 Stiffness of left knee, not elsewhere classified: Secondary | ICD-10-CM | POA: Diagnosis not present

## 2018-10-10 DIAGNOSIS — Z96652 Presence of left artificial knee joint: Secondary | ICD-10-CM | POA: Diagnosis not present

## 2018-10-10 DIAGNOSIS — M25562 Pain in left knee: Secondary | ICD-10-CM | POA: Diagnosis not present

## 2018-10-13 DIAGNOSIS — M25562 Pain in left knee: Secondary | ICD-10-CM | POA: Diagnosis not present

## 2018-10-13 DIAGNOSIS — R2689 Other abnormalities of gait and mobility: Secondary | ICD-10-CM | POA: Diagnosis not present

## 2018-10-13 DIAGNOSIS — Z96652 Presence of left artificial knee joint: Secondary | ICD-10-CM | POA: Diagnosis not present

## 2018-10-13 DIAGNOSIS — M25662 Stiffness of left knee, not elsewhere classified: Secondary | ICD-10-CM | POA: Diagnosis not present

## 2018-10-17 DIAGNOSIS — R7303 Prediabetes: Secondary | ICD-10-CM | POA: Diagnosis not present

## 2018-10-17 DIAGNOSIS — Z96652 Presence of left artificial knee joint: Secondary | ICD-10-CM | POA: Diagnosis not present

## 2018-10-17 DIAGNOSIS — D509 Iron deficiency anemia, unspecified: Secondary | ICD-10-CM | POA: Diagnosis not present

## 2018-10-17 DIAGNOSIS — K219 Gastro-esophageal reflux disease without esophagitis: Secondary | ICD-10-CM | POA: Diagnosis not present

## 2018-10-17 DIAGNOSIS — N183 Chronic kidney disease, stage 3 (moderate): Secondary | ICD-10-CM | POA: Diagnosis not present

## 2018-10-17 DIAGNOSIS — M25662 Stiffness of left knee, not elsewhere classified: Secondary | ICD-10-CM | POA: Diagnosis not present

## 2018-10-17 DIAGNOSIS — M25562 Pain in left knee: Secondary | ICD-10-CM | POA: Diagnosis not present

## 2018-10-17 DIAGNOSIS — E782 Mixed hyperlipidemia: Secondary | ICD-10-CM | POA: Diagnosis not present

## 2018-10-17 DIAGNOSIS — I10 Essential (primary) hypertension: Secondary | ICD-10-CM | POA: Diagnosis not present

## 2018-10-17 DIAGNOSIS — R2689 Other abnormalities of gait and mobility: Secondary | ICD-10-CM | POA: Diagnosis not present

## 2018-10-19 DIAGNOSIS — R2689 Other abnormalities of gait and mobility: Secondary | ICD-10-CM | POA: Diagnosis not present

## 2018-10-19 DIAGNOSIS — M25662 Stiffness of left knee, not elsewhere classified: Secondary | ICD-10-CM | POA: Diagnosis not present

## 2018-10-19 DIAGNOSIS — Z96652 Presence of left artificial knee joint: Secondary | ICD-10-CM | POA: Diagnosis not present

## 2018-10-19 DIAGNOSIS — M25562 Pain in left knee: Secondary | ICD-10-CM | POA: Diagnosis not present

## 2018-10-24 DIAGNOSIS — R2689 Other abnormalities of gait and mobility: Secondary | ICD-10-CM | POA: Diagnosis not present

## 2018-10-24 DIAGNOSIS — Z96652 Presence of left artificial knee joint: Secondary | ICD-10-CM | POA: Diagnosis not present

## 2018-10-24 DIAGNOSIS — M25662 Stiffness of left knee, not elsewhere classified: Secondary | ICD-10-CM | POA: Diagnosis not present

## 2018-10-24 DIAGNOSIS — M25562 Pain in left knee: Secondary | ICD-10-CM | POA: Diagnosis not present

## 2018-10-26 DIAGNOSIS — Z96652 Presence of left artificial knee joint: Secondary | ICD-10-CM | POA: Diagnosis not present

## 2018-10-26 DIAGNOSIS — R2689 Other abnormalities of gait and mobility: Secondary | ICD-10-CM | POA: Diagnosis not present

## 2018-10-26 DIAGNOSIS — M25562 Pain in left knee: Secondary | ICD-10-CM | POA: Diagnosis not present

## 2018-10-26 DIAGNOSIS — M25662 Stiffness of left knee, not elsewhere classified: Secondary | ICD-10-CM | POA: Diagnosis not present

## 2018-10-31 DIAGNOSIS — Z96652 Presence of left artificial knee joint: Secondary | ICD-10-CM | POA: Diagnosis not present

## 2018-10-31 DIAGNOSIS — M25562 Pain in left knee: Secondary | ICD-10-CM | POA: Diagnosis not present

## 2018-10-31 DIAGNOSIS — R2689 Other abnormalities of gait and mobility: Secondary | ICD-10-CM | POA: Diagnosis not present

## 2018-10-31 DIAGNOSIS — M25662 Stiffness of left knee, not elsewhere classified: Secondary | ICD-10-CM | POA: Diagnosis not present

## 2018-11-02 DIAGNOSIS — M25662 Stiffness of left knee, not elsewhere classified: Secondary | ICD-10-CM | POA: Diagnosis not present

## 2018-11-02 DIAGNOSIS — R2689 Other abnormalities of gait and mobility: Secondary | ICD-10-CM | POA: Diagnosis not present

## 2018-11-02 DIAGNOSIS — Z96652 Presence of left artificial knee joint: Secondary | ICD-10-CM | POA: Diagnosis not present

## 2018-11-02 DIAGNOSIS — M25562 Pain in left knee: Secondary | ICD-10-CM | POA: Diagnosis not present

## 2018-11-07 DIAGNOSIS — Z96652 Presence of left artificial knee joint: Secondary | ICD-10-CM | POA: Diagnosis not present

## 2018-11-07 DIAGNOSIS — R2689 Other abnormalities of gait and mobility: Secondary | ICD-10-CM | POA: Diagnosis not present

## 2018-11-07 DIAGNOSIS — M25562 Pain in left knee: Secondary | ICD-10-CM | POA: Diagnosis not present

## 2018-11-07 DIAGNOSIS — M25662 Stiffness of left knee, not elsewhere classified: Secondary | ICD-10-CM | POA: Diagnosis not present

## 2018-11-09 DIAGNOSIS — M25662 Stiffness of left knee, not elsewhere classified: Secondary | ICD-10-CM | POA: Diagnosis not present

## 2018-11-09 DIAGNOSIS — R2689 Other abnormalities of gait and mobility: Secondary | ICD-10-CM | POA: Diagnosis not present

## 2018-11-09 DIAGNOSIS — M25562 Pain in left knee: Secondary | ICD-10-CM | POA: Diagnosis not present

## 2018-11-09 DIAGNOSIS — Z96652 Presence of left artificial knee joint: Secondary | ICD-10-CM | POA: Diagnosis not present

## 2018-11-14 DIAGNOSIS — M25562 Pain in left knee: Secondary | ICD-10-CM | POA: Diagnosis not present

## 2018-11-14 DIAGNOSIS — Z96652 Presence of left artificial knee joint: Secondary | ICD-10-CM | POA: Diagnosis not present

## 2018-11-14 DIAGNOSIS — R2689 Other abnormalities of gait and mobility: Secondary | ICD-10-CM | POA: Diagnosis not present

## 2018-11-14 DIAGNOSIS — M25662 Stiffness of left knee, not elsewhere classified: Secondary | ICD-10-CM | POA: Diagnosis not present

## 2018-11-15 DIAGNOSIS — Z96652 Presence of left artificial knee joint: Secondary | ICD-10-CM | POA: Diagnosis not present

## 2018-11-15 DIAGNOSIS — Z471 Aftercare following joint replacement surgery: Secondary | ICD-10-CM | POA: Diagnosis not present

## 2018-11-15 DIAGNOSIS — M24662 Ankylosis, left knee: Secondary | ICD-10-CM | POA: Diagnosis not present

## 2018-11-16 DIAGNOSIS — R2689 Other abnormalities of gait and mobility: Secondary | ICD-10-CM | POA: Diagnosis not present

## 2018-11-16 DIAGNOSIS — Z96652 Presence of left artificial knee joint: Secondary | ICD-10-CM | POA: Diagnosis not present

## 2018-11-16 DIAGNOSIS — M25662 Stiffness of left knee, not elsewhere classified: Secondary | ICD-10-CM | POA: Diagnosis not present

## 2018-11-16 DIAGNOSIS — M25562 Pain in left knee: Secondary | ICD-10-CM | POA: Diagnosis not present

## 2018-11-21 DIAGNOSIS — R2689 Other abnormalities of gait and mobility: Secondary | ICD-10-CM | POA: Diagnosis not present

## 2018-11-21 DIAGNOSIS — Z96652 Presence of left artificial knee joint: Secondary | ICD-10-CM | POA: Diagnosis not present

## 2018-11-21 DIAGNOSIS — M25562 Pain in left knee: Secondary | ICD-10-CM | POA: Diagnosis not present

## 2018-11-21 DIAGNOSIS — M25662 Stiffness of left knee, not elsewhere classified: Secondary | ICD-10-CM | POA: Diagnosis not present

## 2018-11-23 DIAGNOSIS — Z96652 Presence of left artificial knee joint: Secondary | ICD-10-CM | POA: Diagnosis not present

## 2018-11-23 DIAGNOSIS — M25662 Stiffness of left knee, not elsewhere classified: Secondary | ICD-10-CM | POA: Diagnosis not present

## 2018-11-23 DIAGNOSIS — R2689 Other abnormalities of gait and mobility: Secondary | ICD-10-CM | POA: Diagnosis not present

## 2018-11-23 DIAGNOSIS — M25562 Pain in left knee: Secondary | ICD-10-CM | POA: Diagnosis not present

## 2018-11-28 DIAGNOSIS — R2689 Other abnormalities of gait and mobility: Secondary | ICD-10-CM | POA: Diagnosis not present

## 2018-11-28 DIAGNOSIS — Z96652 Presence of left artificial knee joint: Secondary | ICD-10-CM | POA: Diagnosis not present

## 2018-11-28 DIAGNOSIS — M25562 Pain in left knee: Secondary | ICD-10-CM | POA: Diagnosis not present

## 2018-11-28 DIAGNOSIS — M25662 Stiffness of left knee, not elsewhere classified: Secondary | ICD-10-CM | POA: Diagnosis not present

## 2018-11-30 DIAGNOSIS — M25562 Pain in left knee: Secondary | ICD-10-CM | POA: Diagnosis not present

## 2018-11-30 DIAGNOSIS — Z96652 Presence of left artificial knee joint: Secondary | ICD-10-CM | POA: Diagnosis not present

## 2018-11-30 DIAGNOSIS — R2689 Other abnormalities of gait and mobility: Secondary | ICD-10-CM | POA: Diagnosis not present

## 2018-11-30 DIAGNOSIS — M25662 Stiffness of left knee, not elsewhere classified: Secondary | ICD-10-CM | POA: Diagnosis not present

## 2018-12-05 DIAGNOSIS — R2689 Other abnormalities of gait and mobility: Secondary | ICD-10-CM | POA: Diagnosis not present

## 2018-12-05 DIAGNOSIS — M25662 Stiffness of left knee, not elsewhere classified: Secondary | ICD-10-CM | POA: Diagnosis not present

## 2018-12-05 DIAGNOSIS — Z96652 Presence of left artificial knee joint: Secondary | ICD-10-CM | POA: Diagnosis not present

## 2018-12-05 DIAGNOSIS — M25562 Pain in left knee: Secondary | ICD-10-CM | POA: Diagnosis not present

## 2018-12-07 DIAGNOSIS — R2689 Other abnormalities of gait and mobility: Secondary | ICD-10-CM | POA: Diagnosis not present

## 2018-12-07 DIAGNOSIS — M25662 Stiffness of left knee, not elsewhere classified: Secondary | ICD-10-CM | POA: Diagnosis not present

## 2018-12-07 DIAGNOSIS — M25562 Pain in left knee: Secondary | ICD-10-CM | POA: Diagnosis not present

## 2018-12-07 DIAGNOSIS — Z96652 Presence of left artificial knee joint: Secondary | ICD-10-CM | POA: Diagnosis not present

## 2018-12-13 DIAGNOSIS — M25562 Pain in left knee: Secondary | ICD-10-CM | POA: Diagnosis not present

## 2018-12-13 DIAGNOSIS — M25662 Stiffness of left knee, not elsewhere classified: Secondary | ICD-10-CM | POA: Diagnosis not present

## 2018-12-13 DIAGNOSIS — R2689 Other abnormalities of gait and mobility: Secondary | ICD-10-CM | POA: Diagnosis not present

## 2018-12-13 DIAGNOSIS — Z96652 Presence of left artificial knee joint: Secondary | ICD-10-CM | POA: Diagnosis not present

## 2018-12-20 DIAGNOSIS — Z96652 Presence of left artificial knee joint: Secondary | ICD-10-CM | POA: Diagnosis not present

## 2018-12-20 DIAGNOSIS — R2689 Other abnormalities of gait and mobility: Secondary | ICD-10-CM | POA: Diagnosis not present

## 2018-12-20 DIAGNOSIS — M25662 Stiffness of left knee, not elsewhere classified: Secondary | ICD-10-CM | POA: Diagnosis not present

## 2018-12-20 DIAGNOSIS — M25562 Pain in left knee: Secondary | ICD-10-CM | POA: Diagnosis not present

## 2018-12-27 DIAGNOSIS — R2689 Other abnormalities of gait and mobility: Secondary | ICD-10-CM | POA: Diagnosis not present

## 2018-12-27 DIAGNOSIS — Z96652 Presence of left artificial knee joint: Secondary | ICD-10-CM | POA: Diagnosis not present

## 2018-12-27 DIAGNOSIS — M25562 Pain in left knee: Secondary | ICD-10-CM | POA: Diagnosis not present

## 2018-12-27 DIAGNOSIS — M25662 Stiffness of left knee, not elsewhere classified: Secondary | ICD-10-CM | POA: Diagnosis not present

## 2019-01-17 DIAGNOSIS — M1711 Unilateral primary osteoarthritis, right knee: Secondary | ICD-10-CM | POA: Diagnosis not present

## 2019-01-17 DIAGNOSIS — Z471 Aftercare following joint replacement surgery: Secondary | ICD-10-CM | POA: Diagnosis not present

## 2019-01-17 DIAGNOSIS — Z96652 Presence of left artificial knee joint: Secondary | ICD-10-CM | POA: Diagnosis not present

## 2019-01-23 DIAGNOSIS — L821 Other seborrheic keratosis: Secondary | ICD-10-CM | POA: Diagnosis not present

## 2019-01-23 DIAGNOSIS — L570 Actinic keratosis: Secondary | ICD-10-CM | POA: Diagnosis not present

## 2019-01-23 DIAGNOSIS — L814 Other melanin hyperpigmentation: Secondary | ICD-10-CM | POA: Diagnosis not present

## 2019-04-21 DIAGNOSIS — R69 Illness, unspecified: Secondary | ICD-10-CM | POA: Diagnosis not present

## 2019-06-06 DIAGNOSIS — I2510 Atherosclerotic heart disease of native coronary artery without angina pectoris: Secondary | ICD-10-CM | POA: Diagnosis not present

## 2019-06-06 DIAGNOSIS — M1990 Unspecified osteoarthritis, unspecified site: Secondary | ICD-10-CM | POA: Diagnosis not present

## 2019-06-06 DIAGNOSIS — K219 Gastro-esophageal reflux disease without esophagitis: Secondary | ICD-10-CM | POA: Diagnosis not present

## 2019-06-06 DIAGNOSIS — D509 Iron deficiency anemia, unspecified: Secondary | ICD-10-CM | POA: Diagnosis not present

## 2019-06-06 DIAGNOSIS — Z0000 Encounter for general adult medical examination without abnormal findings: Secondary | ICD-10-CM | POA: Diagnosis not present

## 2019-06-06 DIAGNOSIS — N1831 Chronic kidney disease, stage 3a: Secondary | ICD-10-CM | POA: Diagnosis not present

## 2019-06-06 DIAGNOSIS — I10 Essential (primary) hypertension: Secondary | ICD-10-CM | POA: Diagnosis not present

## 2019-06-06 DIAGNOSIS — E782 Mixed hyperlipidemia: Secondary | ICD-10-CM | POA: Diagnosis not present

## 2019-06-06 DIAGNOSIS — R7303 Prediabetes: Secondary | ICD-10-CM | POA: Diagnosis not present

## 2019-06-06 DIAGNOSIS — Z125 Encounter for screening for malignant neoplasm of prostate: Secondary | ICD-10-CM | POA: Diagnosis not present

## 2019-08-15 DIAGNOSIS — N400 Enlarged prostate without lower urinary tract symptoms: Secondary | ICD-10-CM | POA: Diagnosis not present

## 2019-08-15 DIAGNOSIS — R3121 Asymptomatic microscopic hematuria: Secondary | ICD-10-CM | POA: Diagnosis not present

## 2019-08-15 DIAGNOSIS — R972 Elevated prostate specific antigen [PSA]: Secondary | ICD-10-CM | POA: Diagnosis not present

## 2019-08-29 DIAGNOSIS — N200 Calculus of kidney: Secondary | ICD-10-CM | POA: Diagnosis not present

## 2019-08-29 DIAGNOSIS — R3121 Asymptomatic microscopic hematuria: Secondary | ICD-10-CM | POA: Diagnosis not present

## 2019-09-10 DIAGNOSIS — N200 Calculus of kidney: Secondary | ICD-10-CM | POA: Diagnosis not present

## 2019-09-10 DIAGNOSIS — N400 Enlarged prostate without lower urinary tract symptoms: Secondary | ICD-10-CM | POA: Diagnosis not present

## 2019-09-10 DIAGNOSIS — R3121 Asymptomatic microscopic hematuria: Secondary | ICD-10-CM | POA: Diagnosis not present

## 2019-09-10 DIAGNOSIS — N281 Cyst of kidney, acquired: Secondary | ICD-10-CM | POA: Diagnosis not present

## 2019-09-10 DIAGNOSIS — N5201 Erectile dysfunction due to arterial insufficiency: Secondary | ICD-10-CM | POA: Diagnosis not present

## 2019-09-10 DIAGNOSIS — R972 Elevated prostate specific antigen [PSA]: Secondary | ICD-10-CM | POA: Diagnosis not present

## 2019-11-23 DIAGNOSIS — E782 Mixed hyperlipidemia: Secondary | ICD-10-CM | POA: Diagnosis not present

## 2019-11-23 DIAGNOSIS — I2510 Atherosclerotic heart disease of native coronary artery without angina pectoris: Secondary | ICD-10-CM | POA: Diagnosis not present

## 2019-11-23 DIAGNOSIS — M179 Osteoarthritis of knee, unspecified: Secondary | ICD-10-CM | POA: Diagnosis not present

## 2019-11-23 DIAGNOSIS — D509 Iron deficiency anemia, unspecified: Secondary | ICD-10-CM | POA: Diagnosis not present

## 2019-11-23 DIAGNOSIS — N183 Chronic kidney disease, stage 3 unspecified: Secondary | ICD-10-CM | POA: Diagnosis not present

## 2019-11-23 DIAGNOSIS — I10 Essential (primary) hypertension: Secondary | ICD-10-CM | POA: Diagnosis not present

## 2019-11-23 DIAGNOSIS — M1990 Unspecified osteoarthritis, unspecified site: Secondary | ICD-10-CM | POA: Diagnosis not present

## 2019-11-28 DIAGNOSIS — R972 Elevated prostate specific antigen [PSA]: Secondary | ICD-10-CM | POA: Diagnosis not present

## 2019-11-28 DIAGNOSIS — N5201 Erectile dysfunction due to arterial insufficiency: Secondary | ICD-10-CM | POA: Diagnosis not present

## 2019-12-05 DIAGNOSIS — I10 Essential (primary) hypertension: Secondary | ICD-10-CM | POA: Diagnosis not present

## 2019-12-05 DIAGNOSIS — K219 Gastro-esophageal reflux disease without esophagitis: Secondary | ICD-10-CM | POA: Diagnosis not present

## 2019-12-05 DIAGNOSIS — R7303 Prediabetes: Secondary | ICD-10-CM | POA: Diagnosis not present

## 2019-12-05 DIAGNOSIS — N5201 Erectile dysfunction due to arterial insufficiency: Secondary | ICD-10-CM | POA: Diagnosis not present

## 2019-12-05 DIAGNOSIS — N183 Chronic kidney disease, stage 3 unspecified: Secondary | ICD-10-CM | POA: Diagnosis not present

## 2019-12-05 DIAGNOSIS — E782 Mixed hyperlipidemia: Secondary | ICD-10-CM | POA: Diagnosis not present

## 2019-12-05 DIAGNOSIS — N400 Enlarged prostate without lower urinary tract symptoms: Secondary | ICD-10-CM | POA: Diagnosis not present

## 2019-12-05 DIAGNOSIS — R972 Elevated prostate specific antigen [PSA]: Secondary | ICD-10-CM | POA: Diagnosis not present

## 2019-12-24 DIAGNOSIS — R69 Illness, unspecified: Secondary | ICD-10-CM | POA: Diagnosis not present

## 2020-02-26 DIAGNOSIS — N183 Chronic kidney disease, stage 3 unspecified: Secondary | ICD-10-CM | POA: Diagnosis not present

## 2020-02-26 DIAGNOSIS — I10 Essential (primary) hypertension: Secondary | ICD-10-CM | POA: Diagnosis not present

## 2020-02-26 DIAGNOSIS — E782 Mixed hyperlipidemia: Secondary | ICD-10-CM | POA: Diagnosis not present

## 2020-02-26 DIAGNOSIS — D509 Iron deficiency anemia, unspecified: Secondary | ICD-10-CM | POA: Diagnosis not present

## 2020-02-26 DIAGNOSIS — M1990 Unspecified osteoarthritis, unspecified site: Secondary | ICD-10-CM | POA: Diagnosis not present

## 2020-02-26 DIAGNOSIS — I2510 Atherosclerotic heart disease of native coronary artery without angina pectoris: Secondary | ICD-10-CM | POA: Diagnosis not present

## 2020-02-26 DIAGNOSIS — M179 Osteoarthritis of knee, unspecified: Secondary | ICD-10-CM | POA: Diagnosis not present

## 2020-04-01 DIAGNOSIS — I10 Essential (primary) hypertension: Secondary | ICD-10-CM | POA: Diagnosis not present

## 2020-04-01 DIAGNOSIS — M179 Osteoarthritis of knee, unspecified: Secondary | ICD-10-CM | POA: Diagnosis not present

## 2020-04-01 DIAGNOSIS — M1990 Unspecified osteoarthritis, unspecified site: Secondary | ICD-10-CM | POA: Diagnosis not present

## 2020-04-01 DIAGNOSIS — I2510 Atherosclerotic heart disease of native coronary artery without angina pectoris: Secondary | ICD-10-CM | POA: Diagnosis not present

## 2020-04-01 DIAGNOSIS — N183 Chronic kidney disease, stage 3 unspecified: Secondary | ICD-10-CM | POA: Diagnosis not present

## 2020-04-01 DIAGNOSIS — E782 Mixed hyperlipidemia: Secondary | ICD-10-CM | POA: Diagnosis not present

## 2020-04-01 DIAGNOSIS — D509 Iron deficiency anemia, unspecified: Secondary | ICD-10-CM | POA: Diagnosis not present

## 2020-04-23 DIAGNOSIS — N183 Chronic kidney disease, stage 3 unspecified: Secondary | ICD-10-CM | POA: Diagnosis not present

## 2020-04-23 DIAGNOSIS — M179 Osteoarthritis of knee, unspecified: Secondary | ICD-10-CM | POA: Diagnosis not present

## 2020-04-23 DIAGNOSIS — I2510 Atherosclerotic heart disease of native coronary artery without angina pectoris: Secondary | ICD-10-CM | POA: Diagnosis not present

## 2020-04-23 DIAGNOSIS — M1990 Unspecified osteoarthritis, unspecified site: Secondary | ICD-10-CM | POA: Diagnosis not present

## 2020-04-23 DIAGNOSIS — I10 Essential (primary) hypertension: Secondary | ICD-10-CM | POA: Diagnosis not present

## 2020-04-23 DIAGNOSIS — E782 Mixed hyperlipidemia: Secondary | ICD-10-CM | POA: Diagnosis not present

## 2020-04-23 DIAGNOSIS — D509 Iron deficiency anemia, unspecified: Secondary | ICD-10-CM | POA: Diagnosis not present

## 2020-04-24 DIAGNOSIS — H26493 Other secondary cataract, bilateral: Secondary | ICD-10-CM | POA: Diagnosis not present

## 2020-04-24 DIAGNOSIS — H524 Presbyopia: Secondary | ICD-10-CM | POA: Diagnosis not present

## 2020-04-24 DIAGNOSIS — Z961 Presence of intraocular lens: Secondary | ICD-10-CM | POA: Diagnosis not present

## 2020-04-24 DIAGNOSIS — H43391 Other vitreous opacities, right eye: Secondary | ICD-10-CM | POA: Diagnosis not present

## 2020-05-03 DIAGNOSIS — R69 Illness, unspecified: Secondary | ICD-10-CM | POA: Diagnosis not present

## 2020-05-15 DIAGNOSIS — M179 Osteoarthritis of knee, unspecified: Secondary | ICD-10-CM | POA: Diagnosis not present

## 2020-05-15 DIAGNOSIS — D509 Iron deficiency anemia, unspecified: Secondary | ICD-10-CM | POA: Diagnosis not present

## 2020-05-15 DIAGNOSIS — I10 Essential (primary) hypertension: Secondary | ICD-10-CM | POA: Diagnosis not present

## 2020-05-15 DIAGNOSIS — E782 Mixed hyperlipidemia: Secondary | ICD-10-CM | POA: Diagnosis not present

## 2020-05-15 DIAGNOSIS — I2510 Atherosclerotic heart disease of native coronary artery without angina pectoris: Secondary | ICD-10-CM | POA: Diagnosis not present

## 2020-05-15 DIAGNOSIS — N183 Chronic kidney disease, stage 3 unspecified: Secondary | ICD-10-CM | POA: Diagnosis not present

## 2020-05-15 DIAGNOSIS — M1990 Unspecified osteoarthritis, unspecified site: Secondary | ICD-10-CM | POA: Diagnosis not present

## 2020-06-09 DIAGNOSIS — M179 Osteoarthritis of knee, unspecified: Secondary | ICD-10-CM | POA: Diagnosis not present

## 2020-06-09 DIAGNOSIS — R7309 Other abnormal glucose: Secondary | ICD-10-CM | POA: Diagnosis not present

## 2020-06-09 DIAGNOSIS — E782 Mixed hyperlipidemia: Secondary | ICD-10-CM | POA: Diagnosis not present

## 2020-06-09 DIAGNOSIS — K219 Gastro-esophageal reflux disease without esophagitis: Secondary | ICD-10-CM | POA: Diagnosis not present

## 2020-06-09 DIAGNOSIS — I10 Essential (primary) hypertension: Secondary | ICD-10-CM | POA: Diagnosis not present

## 2020-06-09 DIAGNOSIS — Z0000 Encounter for general adult medical examination without abnormal findings: Secondary | ICD-10-CM | POA: Diagnosis not present

## 2020-06-09 DIAGNOSIS — N183 Chronic kidney disease, stage 3 unspecified: Secondary | ICD-10-CM | POA: Diagnosis not present

## 2020-06-09 DIAGNOSIS — R7303 Prediabetes: Secondary | ICD-10-CM | POA: Diagnosis not present

## 2020-06-09 DIAGNOSIS — R946 Abnormal results of thyroid function studies: Secondary | ICD-10-CM | POA: Diagnosis not present

## 2020-06-17 DIAGNOSIS — H26491 Other secondary cataract, right eye: Secondary | ICD-10-CM | POA: Diagnosis not present

## 2020-06-23 DIAGNOSIS — M5459 Other low back pain: Secondary | ICD-10-CM | POA: Diagnosis not present

## 2020-06-23 DIAGNOSIS — M545 Low back pain, unspecified: Secondary | ICD-10-CM | POA: Diagnosis not present

## 2020-06-30 DIAGNOSIS — R69 Illness, unspecified: Secondary | ICD-10-CM | POA: Diagnosis not present

## 2020-07-09 DIAGNOSIS — M47816 Spondylosis without myelopathy or radiculopathy, lumbar region: Secondary | ICD-10-CM | POA: Diagnosis not present

## 2020-07-11 DIAGNOSIS — H26492 Other secondary cataract, left eye: Secondary | ICD-10-CM | POA: Diagnosis not present

## 2020-07-11 DIAGNOSIS — Z09 Encounter for follow-up examination after completed treatment for conditions other than malignant neoplasm: Secondary | ICD-10-CM | POA: Diagnosis not present

## 2020-07-29 DIAGNOSIS — H0015 Chalazion left lower eyelid: Secondary | ICD-10-CM | POA: Diagnosis not present

## 2020-08-21 DIAGNOSIS — Z20828 Contact with and (suspected) exposure to other viral communicable diseases: Secondary | ICD-10-CM | POA: Diagnosis not present

## 2020-08-21 DIAGNOSIS — R509 Fever, unspecified: Secondary | ICD-10-CM | POA: Diagnosis not present

## 2020-08-21 DIAGNOSIS — R0981 Nasal congestion: Secondary | ICD-10-CM | POA: Diagnosis not present

## 2020-08-26 DIAGNOSIS — N183 Chronic kidney disease, stage 3 unspecified: Secondary | ICD-10-CM | POA: Diagnosis not present

## 2020-08-26 DIAGNOSIS — I2510 Atherosclerotic heart disease of native coronary artery without angina pectoris: Secondary | ICD-10-CM | POA: Diagnosis not present

## 2020-08-26 DIAGNOSIS — M179 Osteoarthritis of knee, unspecified: Secondary | ICD-10-CM | POA: Diagnosis not present

## 2020-08-26 DIAGNOSIS — M1990 Unspecified osteoarthritis, unspecified site: Secondary | ICD-10-CM | POA: Diagnosis not present

## 2020-08-26 DIAGNOSIS — E782 Mixed hyperlipidemia: Secondary | ICD-10-CM | POA: Diagnosis not present

## 2020-08-26 DIAGNOSIS — E039 Hypothyroidism, unspecified: Secondary | ICD-10-CM | POA: Diagnosis not present

## 2020-08-26 DIAGNOSIS — K219 Gastro-esophageal reflux disease without esophagitis: Secondary | ICD-10-CM | POA: Diagnosis not present

## 2020-08-26 DIAGNOSIS — I10 Essential (primary) hypertension: Secondary | ICD-10-CM | POA: Diagnosis not present

## 2020-08-26 DIAGNOSIS — D509 Iron deficiency anemia, unspecified: Secondary | ICD-10-CM | POA: Diagnosis not present

## 2020-09-09 DIAGNOSIS — E039 Hypothyroidism, unspecified: Secondary | ICD-10-CM | POA: Diagnosis not present

## 2020-09-25 DIAGNOSIS — M47816 Spondylosis without myelopathy or radiculopathy, lumbar region: Secondary | ICD-10-CM | POA: Diagnosis not present

## 2020-10-09 DIAGNOSIS — G894 Chronic pain syndrome: Secondary | ICD-10-CM | POA: Diagnosis not present

## 2020-11-11 DIAGNOSIS — M961 Postlaminectomy syndrome, not elsewhere classified: Secondary | ICD-10-CM | POA: Diagnosis not present

## 2020-11-25 DIAGNOSIS — Z79891 Long term (current) use of opiate analgesic: Secondary | ICD-10-CM | POA: Diagnosis not present

## 2020-12-08 DIAGNOSIS — I2510 Atherosclerotic heart disease of native coronary artery without angina pectoris: Secondary | ICD-10-CM | POA: Diagnosis not present

## 2020-12-08 DIAGNOSIS — I10 Essential (primary) hypertension: Secondary | ICD-10-CM | POA: Diagnosis not present

## 2020-12-08 DIAGNOSIS — E039 Hypothyroidism, unspecified: Secondary | ICD-10-CM | POA: Diagnosis not present

## 2020-12-08 DIAGNOSIS — K219 Gastro-esophageal reflux disease without esophagitis: Secondary | ICD-10-CM | POA: Diagnosis not present

## 2020-12-08 DIAGNOSIS — E782 Mixed hyperlipidemia: Secondary | ICD-10-CM | POA: Diagnosis not present

## 2020-12-08 DIAGNOSIS — R7309 Other abnormal glucose: Secondary | ICD-10-CM | POA: Diagnosis not present

## 2020-12-08 DIAGNOSIS — N1831 Chronic kidney disease, stage 3a: Secondary | ICD-10-CM | POA: Diagnosis not present

## 2020-12-23 DIAGNOSIS — I2510 Atherosclerotic heart disease of native coronary artery without angina pectoris: Secondary | ICD-10-CM | POA: Diagnosis not present

## 2020-12-23 DIAGNOSIS — I10 Essential (primary) hypertension: Secondary | ICD-10-CM | POA: Diagnosis not present

## 2020-12-23 DIAGNOSIS — M5416 Radiculopathy, lumbar region: Secondary | ICD-10-CM | POA: Diagnosis not present

## 2020-12-23 DIAGNOSIS — K219 Gastro-esophageal reflux disease without esophagitis: Secondary | ICD-10-CM | POA: Diagnosis not present

## 2020-12-23 DIAGNOSIS — N1831 Chronic kidney disease, stage 3a: Secondary | ICD-10-CM | POA: Diagnosis not present

## 2020-12-23 DIAGNOSIS — M1990 Unspecified osteoarthritis, unspecified site: Secondary | ICD-10-CM | POA: Diagnosis not present

## 2020-12-23 DIAGNOSIS — E039 Hypothyroidism, unspecified: Secondary | ICD-10-CM | POA: Diagnosis not present

## 2020-12-23 DIAGNOSIS — E782 Mixed hyperlipidemia: Secondary | ICD-10-CM | POA: Diagnosis not present

## 2020-12-23 DIAGNOSIS — D509 Iron deficiency anemia, unspecified: Secondary | ICD-10-CM | POA: Diagnosis not present

## 2021-02-06 DIAGNOSIS — M1990 Unspecified osteoarthritis, unspecified site: Secondary | ICD-10-CM | POA: Diagnosis not present

## 2021-02-06 DIAGNOSIS — I2510 Atherosclerotic heart disease of native coronary artery without angina pectoris: Secondary | ICD-10-CM | POA: Diagnosis not present

## 2021-02-06 DIAGNOSIS — D509 Iron deficiency anemia, unspecified: Secondary | ICD-10-CM | POA: Diagnosis not present

## 2021-02-06 DIAGNOSIS — K219 Gastro-esophageal reflux disease without esophagitis: Secondary | ICD-10-CM | POA: Diagnosis not present

## 2021-02-06 DIAGNOSIS — N183 Chronic kidney disease, stage 3 unspecified: Secondary | ICD-10-CM | POA: Diagnosis not present

## 2021-02-06 DIAGNOSIS — I10 Essential (primary) hypertension: Secondary | ICD-10-CM | POA: Diagnosis not present

## 2021-02-06 DIAGNOSIS — N1831 Chronic kidney disease, stage 3a: Secondary | ICD-10-CM | POA: Diagnosis not present

## 2021-02-06 DIAGNOSIS — E039 Hypothyroidism, unspecified: Secondary | ICD-10-CM | POA: Diagnosis not present

## 2021-02-06 DIAGNOSIS — E782 Mixed hyperlipidemia: Secondary | ICD-10-CM | POA: Diagnosis not present

## 2021-02-06 DIAGNOSIS — M179 Osteoarthritis of knee, unspecified: Secondary | ICD-10-CM | POA: Diagnosis not present

## 2021-03-10 DIAGNOSIS — K219 Gastro-esophageal reflux disease without esophagitis: Secondary | ICD-10-CM | POA: Diagnosis not present

## 2021-03-10 DIAGNOSIS — I2510 Atherosclerotic heart disease of native coronary artery without angina pectoris: Secondary | ICD-10-CM | POA: Diagnosis not present

## 2021-03-10 DIAGNOSIS — I10 Essential (primary) hypertension: Secondary | ICD-10-CM | POA: Diagnosis not present

## 2021-03-10 DIAGNOSIS — M1990 Unspecified osteoarthritis, unspecified site: Secondary | ICD-10-CM | POA: Diagnosis not present

## 2021-03-10 DIAGNOSIS — N183 Chronic kidney disease, stage 3 unspecified: Secondary | ICD-10-CM | POA: Diagnosis not present

## 2021-03-10 DIAGNOSIS — D509 Iron deficiency anemia, unspecified: Secondary | ICD-10-CM | POA: Diagnosis not present

## 2021-03-10 DIAGNOSIS — E782 Mixed hyperlipidemia: Secondary | ICD-10-CM | POA: Diagnosis not present

## 2021-03-10 DIAGNOSIS — M179 Osteoarthritis of knee, unspecified: Secondary | ICD-10-CM | POA: Diagnosis not present

## 2021-03-10 DIAGNOSIS — E039 Hypothyroidism, unspecified: Secondary | ICD-10-CM | POA: Diagnosis not present

## 2021-03-10 DIAGNOSIS — N1831 Chronic kidney disease, stage 3a: Secondary | ICD-10-CM | POA: Diagnosis not present

## 2021-03-25 DIAGNOSIS — G894 Chronic pain syndrome: Secondary | ICD-10-CM | POA: Diagnosis not present

## 2021-03-25 DIAGNOSIS — Z79891 Long term (current) use of opiate analgesic: Secondary | ICD-10-CM | POA: Diagnosis not present

## 2021-05-14 DIAGNOSIS — N400 Enlarged prostate without lower urinary tract symptoms: Secondary | ICD-10-CM | POA: Diagnosis not present

## 2021-05-21 DIAGNOSIS — N281 Cyst of kidney, acquired: Secondary | ICD-10-CM | POA: Diagnosis not present

## 2021-05-21 DIAGNOSIS — R350 Frequency of micturition: Secondary | ICD-10-CM | POA: Diagnosis not present

## 2021-05-21 DIAGNOSIS — N401 Enlarged prostate with lower urinary tract symptoms: Secondary | ICD-10-CM | POA: Diagnosis not present

## 2021-05-21 DIAGNOSIS — R972 Elevated prostate specific antigen [PSA]: Secondary | ICD-10-CM | POA: Diagnosis not present

## 2021-06-15 DIAGNOSIS — I2510 Atherosclerotic heart disease of native coronary artery without angina pectoris: Secondary | ICD-10-CM | POA: Diagnosis not present

## 2021-06-15 DIAGNOSIS — D509 Iron deficiency anemia, unspecified: Secondary | ICD-10-CM | POA: Diagnosis not present

## 2021-06-15 DIAGNOSIS — Z0000 Encounter for general adult medical examination without abnormal findings: Secondary | ICD-10-CM | POA: Diagnosis not present

## 2021-06-15 DIAGNOSIS — E782 Mixed hyperlipidemia: Secondary | ICD-10-CM | POA: Diagnosis not present

## 2021-06-15 DIAGNOSIS — E039 Hypothyroidism, unspecified: Secondary | ICD-10-CM | POA: Diagnosis not present

## 2021-06-15 DIAGNOSIS — R7309 Other abnormal glucose: Secondary | ICD-10-CM | POA: Diagnosis not present

## 2021-06-15 DIAGNOSIS — I10 Essential (primary) hypertension: Secondary | ICD-10-CM | POA: Diagnosis not present

## 2021-06-15 DIAGNOSIS — N183 Chronic kidney disease, stage 3 unspecified: Secondary | ICD-10-CM | POA: Diagnosis not present

## 2021-06-15 DIAGNOSIS — R7303 Prediabetes: Secondary | ICD-10-CM | POA: Diagnosis not present

## 2021-06-15 DIAGNOSIS — K219 Gastro-esophageal reflux disease without esophagitis: Secondary | ICD-10-CM | POA: Diagnosis not present

## 2021-06-15 DIAGNOSIS — M179 Osteoarthritis of knee, unspecified: Secondary | ICD-10-CM | POA: Diagnosis not present

## 2021-07-13 DIAGNOSIS — L570 Actinic keratosis: Secondary | ICD-10-CM | POA: Diagnosis not present

## 2021-07-13 DIAGNOSIS — L820 Inflamed seborrheic keratosis: Secondary | ICD-10-CM | POA: Diagnosis not present

## 2021-07-13 DIAGNOSIS — L821 Other seborrheic keratosis: Secondary | ICD-10-CM | POA: Diagnosis not present

## 2021-08-07 DIAGNOSIS — M25551 Pain in right hip: Secondary | ICD-10-CM | POA: Diagnosis not present

## 2021-09-04 DIAGNOSIS — N281 Cyst of kidney, acquired: Secondary | ICD-10-CM | POA: Diagnosis not present

## 2021-09-04 DIAGNOSIS — N200 Calculus of kidney: Secondary | ICD-10-CM | POA: Diagnosis not present

## 2021-09-04 DIAGNOSIS — K862 Cyst of pancreas: Secondary | ICD-10-CM | POA: Diagnosis not present

## 2021-09-04 DIAGNOSIS — K8020 Calculus of gallbladder without cholecystitis without obstruction: Secondary | ICD-10-CM | POA: Diagnosis not present

## 2021-09-09 DIAGNOSIS — N200 Calculus of kidney: Secondary | ICD-10-CM | POA: Diagnosis not present

## 2021-09-09 DIAGNOSIS — N281 Cyst of kidney, acquired: Secondary | ICD-10-CM | POA: Diagnosis not present

## 2021-09-09 DIAGNOSIS — R972 Elevated prostate specific antigen [PSA]: Secondary | ICD-10-CM | POA: Diagnosis not present

## 2021-10-27 ENCOUNTER — Encounter (HOSPITAL_COMMUNITY): Payer: Self-pay

## 2021-10-27 ENCOUNTER — Emergency Department (HOSPITAL_COMMUNITY): Payer: Medicare HMO

## 2021-10-27 ENCOUNTER — Other Ambulatory Visit: Payer: Self-pay

## 2021-10-27 ENCOUNTER — Emergency Department (HOSPITAL_COMMUNITY)
Admission: EM | Admit: 2021-10-27 | Discharge: 2021-10-27 | Disposition: A | Discharge status: Home / Self Care | Payer: Medicare HMO | Attending: Emergency Medicine | Admitting: Emergency Medicine

## 2021-10-27 DIAGNOSIS — R109 Unspecified abdominal pain: Secondary | ICD-10-CM | POA: Diagnosis not present

## 2021-10-27 DIAGNOSIS — K449 Diaphragmatic hernia without obstruction or gangrene: Secondary | ICD-10-CM | POA: Diagnosis not present

## 2021-10-27 DIAGNOSIS — N200 Calculus of kidney: Secondary | ICD-10-CM | POA: Insufficient documentation

## 2021-10-27 DIAGNOSIS — I10 Essential (primary) hypertension: Secondary | ICD-10-CM | POA: Insufficient documentation

## 2021-10-27 DIAGNOSIS — N201 Calculus of ureter: Secondary | ICD-10-CM | POA: Diagnosis not present

## 2021-10-27 DIAGNOSIS — Z79899 Other long term (current) drug therapy: Secondary | ICD-10-CM | POA: Insufficient documentation

## 2021-10-27 DIAGNOSIS — K8020 Calculus of gallbladder without cholecystitis without obstruction: Secondary | ICD-10-CM | POA: Diagnosis not present

## 2021-10-27 LAB — COMPREHENSIVE METABOLIC PANEL
ALT: 21 U/L (ref 0–44)
AST: 22 U/L (ref 15–41)
Albumin: 4 g/dL (ref 3.5–5.0)
Alkaline Phosphatase: 56 U/L (ref 38–126)
Anion gap: 9 (ref 5–15)
BUN: 18 mg/dL (ref 8–23)
CO2: 23 mmol/L (ref 22–32)
Calcium: 9.1 mg/dL (ref 8.9–10.3)
Chloride: 107 mmol/L (ref 98–111)
Creatinine, Ser: 1.63 mg/dL — ABNORMAL HIGH (ref 0.61–1.24)
GFR, Estimated: 42 mL/min — ABNORMAL LOW (ref >60)
Glucose, Bld: 159 mg/dL — ABNORMAL HIGH (ref 70–99)
Potassium: 3.8 mmol/L (ref 3.5–5.1)
Sodium: 139 mmol/L (ref 135–145)
Total Bilirubin: 0.9 mg/dL (ref 0.3–1.2)
Total Protein: 7 g/dL (ref 6.5–8.1)

## 2021-10-27 LAB — CBC WITH DIFFERENTIAL/PLATELET
Abs Immature Granulocytes: 0.06 10*3/uL (ref 0.00–0.07)
Basophils Absolute: 0.1 10*3/uL (ref 0.0–0.1)
Basophils Relative: 1 %
Eosinophils Absolute: 0 10*3/uL (ref 0.0–0.5)
Eosinophils Relative: 0 %
HCT: 38.1 % — ABNORMAL LOW (ref 39.0–52.0)
Hemoglobin: 12.4 g/dL — ABNORMAL LOW (ref 13.0–17.0)
Immature Granulocytes: 1 %
Lymphocytes Relative: 12 %
Lymphs Abs: 1.2 10*3/uL (ref 0.7–4.0)
MCH: 31.6 pg (ref 26.0–34.0)
MCHC: 32.5 g/dL (ref 30.0–36.0)
MCV: 97.2 fL (ref 80.0–100.0)
Monocytes Absolute: 0.5 10*3/uL (ref 0.1–1.0)
Monocytes Relative: 5 %
Neutro Abs: 7.9 10*3/uL — ABNORMAL HIGH (ref 1.7–7.7)
Neutrophils Relative %: 81 %
Platelets: 139 10*3/uL — ABNORMAL LOW (ref 150–400)
RBC: 3.92 MIL/uL — ABNORMAL LOW (ref 4.22–5.81)
RDW: 14.4 % (ref 11.5–15.5)
WBC: 9.8 10*3/uL (ref 4.0–10.5)
nRBC: 0 % (ref 0.0–0.2)

## 2021-10-27 LAB — URINALYSIS, ROUTINE W REFLEX MICROSCOPIC
Bacteria, UA: NONE SEEN
Bilirubin Urine: NEGATIVE
Glucose, UA: NEGATIVE mg/dL
Ketones, ur: NEGATIVE mg/dL
Leukocytes,Ua: NEGATIVE
Nitrite: NEGATIVE
Protein, ur: 30 mg/dL — AB
RBC / HPF: >50 RBC/hpf — ABNORMAL HIGH (ref 0–5)
Specific Gravity, Urine: 1.02 (ref 1.005–1.030)
pH: 5 (ref 5.0–8.0)

## 2021-10-27 MED ORDER — TRAMADOL HCL 50 MG PO TABS: 50.0000 mg | ORAL_TABLET | Freq: Four times a day (QID) | ORAL | 0 refills | Status: DC | PRN

## 2021-10-27 MED ORDER — LACTATED RINGERS IV BOLUS
500.0000 mL | Freq: Once | INTRAVENOUS | Status: AC
  Administered 2021-10-27: 500 mL via INTRAVENOUS

## 2021-10-27 MED ORDER — KETOROLAC TROMETHAMINE 15 MG/ML IJ SOLN
15.0000 mg | Freq: Once | INTRAMUSCULAR | Status: AC
  Administered 2021-10-27: 15 mg via INTRAVENOUS
  Filled 2021-10-27: qty 1

## 2021-10-27 MED ORDER — FENTANYL CITRATE PF 50 MCG/ML IJ SOSY
50.0000 ug | PREFILLED_SYRINGE | Freq: Once | INTRAMUSCULAR | Status: AC
  Administered 2021-10-27: 50 ug via INTRAVENOUS
  Filled 2021-10-27: qty 1

## 2021-10-27 MED ORDER — ONDANSETRON HCL 4 MG PO TABS: 4.0000 mg | ORAL_TABLET | Freq: Four times a day (QID) | ORAL | 0 refills | Status: DC

## 2021-10-27 MED ORDER — TAMSULOSIN HCL 0.4 MG PO CAPS: 0.4000 mg | ORAL_CAPSULE | Freq: Every day | ORAL | 0 refills | Status: DC

## 2021-10-27 NOTE — ED Triage Notes (Addendum)
Pt presents to ED with c/o right side flank pain that began at midnight. Pt endorses N/V with difficulty urinating. Pt vomiting upon triage. Hx Kidney Stones  ?

## 2021-10-27 NOTE — ED Notes (Signed)
Pt ambulated w/pulse ox. SPO2 95-97% ?

## 2021-10-27 NOTE — Discharge Instructions (Addendum)
You were seen here today for evaluation of your right flank pain. It was discovered that you have a kidney stone. I am sending you home on several medications. One being Tamsulosin which you will take nightly with supper. Additionally, I have prescribed some Zofran which is an anti nausea medication for you to take as needed. Lastly, I have prescribed you a few Tramadol for pain.  Please call [facility] upon discharge to schedule and appointment either today for tomorrow for re-evaluation. If you have any fevers, chills, worsening pain, nausea/vomiting despite the medications, please return to the ED immediately for re-evaluation. Additionally, please follow up with your primary care provider for possible sleep apnea.  ? ?Contact a health care provider if: ?You have pain that gets worse or does not get better with medicine. ?Get help right away if: ?You have a fever or chills. ?You develop severe pain. ?You develop new abdominal pain. ?You faint. ?You are unable to urinate. ?

## 2021-10-27 NOTE — ED Provider Notes (Signed)
?[facility] DEPT ?Provider Note ? ? ?CSN: [id number] ?Arrival date & time: 10/27/21  0559 ? ?  ? ?History ?Chief Complaint  ?Patient presents with  ? Flank Pain  ? ? ?[name] is a 86 y.o. male with h/o renal stones, chronic back pain, HTN, pre-diabetes presents to the ED for evaluation of right sided flank pain that started around midnight/0000 today. The patient reports that this feels just like his last kidney stone.  He reports he has urinary urgency and frequency at baseline but has not noticed any exacerbation in this.  He reports he feels that he is not emptying his bladder completely. He reports it as a continuous, sharp pain.  He denies any fever, dysuria, hematuria, abdominal pain, diarrhea, constipation, urine dribbling, chest pain, or shortness of breath.  The patient does endorse some nausea and 2 episodes of nonbloody, nonbilious emesis.  He reports he has had multiple backs surgeries in the lumbar area and is had a lithotripsy and basket procedure for previous stones in the past. ? ? ?Flank Pain ?Pertinent negatives include no chest pain, no abdominal pain and no shortness of breath.  ? ?  ? ?Home Medications ?Prior to Admission medications   ?Medication Sig Start Date End Date Taking? Authorizing Provider  ?ondansetron (ZOFRAN) 4 MG tablet Take 1 tablet (4 mg total) by mouth every 6 (six) hours. 10/27/21  Yes Sherrell Puller, PA-C  ?tamsulosin (FLOMAX) 0.4 MG CAPS capsule Take 1 capsule (0.4 mg total) by mouth daily after supper. 10/27/21  Yes Sherrell Puller, PA-C  ?traMADol (ULTRAM) 50 MG tablet Take 1 tablet (50 mg total) by mouth every 6 (six) hours as needed. 10/27/21  Yes Sherrell Puller, PA-C  ?atorvastatin (LIPITOR) 80 MG tablet Take 1 tablet (80 mg total) by mouth at bedtime. 11/26/13   Minus Breeding, MD  ?docusate sodium (COLACE) 100 MG capsule Take 1 capsule (100 mg total) by mouth 2 (two) times daily. 10/02/18   Danae Orleans, PA-C  ?esomeprazole (NEXIUM) 20 MG  capsule Take 20 mg by mouth daily before breakfast.     [provider]  ?ferrous sulfate (FERROUSUL) 325 (65 FE) MG tablet Take 1 tablet (325 mg total) by mouth 3 (three) times daily with meals. 10/02/18   Danae Orleans, PA-C  ?folic acid (FOLVITE) 998 MCG tablet Take 800 mcg by mouth daily.     [provider]  ?methocarbamol (ROBAXIN) 500 MG tablet Take 1 tablet (500 mg total) by mouth every 6 (six) hours as needed for muscle spasms. 10/02/18   Danae Orleans, PA-C  ?metoprolol tartrate (LOPRESSOR) 25 MG tablet Take 1 tablet (25 mg total) by mouth 2 (two) times daily. 11/26/13   Minus Breeding, MD  ?niacin 500 MG tablet Take 500 mg by mouth daily.    [provider]  ?nitroGLYCERIN (NITROSTAT) 0.4 MG SL tablet Place 0.4 mg under the tongue every 5 (five) minutes as needed for chest pain.    [provider]  ?polyethylene glycol (MIRALAX / GLYCOLAX) packet Take 17 g by mouth 2 (two) times daily. 10/02/18   Danae Orleans, PA-C  ?   ? ?Allergies    ?Patient has no known allergies.   ? ?Review of Systems   ?Review of Systems  ?Constitutional:  Negative for fever.  ?Respiratory:  Negative for shortness of breath.   ?Cardiovascular:  Negative for chest pain.  ?Gastrointestinal:  Positive for nausea and vomiting. Negative for abdominal pain, constipation and diarrhea.  ?Genitourinary:  Positive  for flank pain, frequency and urgency.  ? ?Physical Exam ?Updated Vital Signs ?BP 123/79   Pulse 73   Temp 98.6 ?F (37 ?C) (Oral)   Resp 18   Ht '5\' 8"'$  (1.727 m)   Wt 81.4 kg   SpO2 96%   BMI 27.29 kg/m?  ?Physical Exam ?Vitals and nursing note reviewed.  ?Constitutional:   ?   Appearance: He is not toxic-appearing or diaphoretic.  ?   Comments: Uncomfortable, but not toxic appearing  ?HENT:  ?   Head: Normocephalic and atraumatic.  ?   Mouth/Throat:  ?   Mouth: Mucous membranes are dry.  ?Eyes:  ?   General: No scleral icterus. ?Cardiovascular:  ?   Rate and Rhythm: Normal rate and  regular rhythm.  ?Pulmonary:  ?   Effort: Pulmonary effort is normal. No respiratory distress.  ?   Breath sounds: Normal breath sounds.  ?Abdominal:  ?   General: Bowel sounds are normal.  ?   Palpations: Abdomen is soft.  ?   Tenderness: There is no abdominal tenderness. There is no right CVA tenderness, left CVA tenderness, guarding or rebound.  ?Musculoskeletal:     ?   General: No deformity.  ?     Arms: ? ?   Cervical back: Normal range of motion.  ?   Right lower leg: No edema.  ?   Left lower leg: No edema.  ?   Comments: The patient reports his pain is in the marked area above, however is not tender to palpation. I do not appreciated any erythema or overlying warmth. No skin changes noted.  ? ?No midline or paraspinal tenderness to palpation. The patient is able to change form a lying to sitting position without assistance.   ?Skin: ?   General: Skin is warm and dry.  ?Neurological:  ?   General: No focal deficit present.  ?   Mental Status: He is alert. Mental status is at baseline.  ? ? ?ED Results / Procedures / Treatments   ?Labs ?(all labs ordered are listed, but only abnormal results are displayed) ?Labs Reviewed  ?CBC WITH DIFFERENTIAL/PLATELET - Abnormal; Notable for the following components:  ?    Result Value  ? RBC 3.92 (*)   ? Hemoglobin 12.4 (*)   ? HCT 38.1 (*)   ? Platelets 139 (*)   ? Neutro Abs 7.9 (*)   ? All other components within normal limits  ?URINALYSIS, ROUTINE W REFLEX MICROSCOPIC - Abnormal; Notable for the following components:  ? APPearance HAZY (*)   ? Hgb urine dipstick LARGE (*)   ? Protein, ur 30 (*)   ? RBC / HPF >50 (*)   ? All other components within normal limits  ?COMPREHENSIVE METABOLIC PANEL - Abnormal; Notable for the following components:  ? Glucose, Bld 159 (*)   ? Creatinine, Ser 1.63 (*)   ? GFR, Estimated 42 (*)   ? All other components within normal limits  ? ? ?EKG ?None ? ?Radiology ?CT Renal Stone Study ? ?Result Date: 10/27/2021 ?CLINICAL DATA:  Right flank  pain EXAM: CT ABDOMEN AND PELVIS WITHOUT CONTRAST TECHNIQUE: Multidetector CT imaging of the abdomen and pelvis was performed following the standard protocol without IV contrast. RADIATION DOSE REDUCTION: This exam was performed according to the departmental dose-optimization program which includes automated exposure control, adjustment of the mA and/or kV according to patient size and/or use of iterative reconstruction technique. COMPARISON:  CT abdomen and pelvis 09/04/2021 FINDINGS: Lower  chest: Cardiomegaly and small pericardial effusion. Evidence of interstitial edema. Small bilateral pleural effusions right greater than left. Hepatobiliary: Liver is normal in size and contour with no suspicious mass identified. Multiple calcified stones identified in the gallbladder. No gallbladder wall thickening or pericholecystic edema. No biliary ductal dilatation identified. Pancreas: Atrophic with no ductal dilatation identified. Slight interval decreased size of a 1.5 cm well-defined hypodense cystic lesion in the tail the pancreas. Stable 1.2 cm lesion in the posterior pancreatic head. Spleen: Normal in size without focal abnormality. Adrenals/Urinary Tract: Adrenal glands are normal. A few nonobstructing renal calculi identified bilaterally measuring up to 3 mm. There is a 4 x 5 mm obstructing calculus in the proximal right ureter approximately 4 cm distal to the UPJ. Mild right hydronephrosis and perinephric fat stranding. No urinary bladder mass identified. Note is made of the right anterior aspect of the urinary bladder partially entering a right inguinal hernia. Stomach/Bowel: Small hiatal hernia. No bowel obstruction, free air or pneumatosis. No bowel wall edema identified. Vascular/Lymphatic: Aortic atherosclerosis. No enlarged abdominal or pelvic lymph nodes. Reproductive: Prostate gland is enlarged. Other: No ascites. Right inguinal hernia containing fat and small portion of the urinary bladder.  Musculoskeletal: Degenerative and postsurgical changes in the lumbar spine. No suspicious bony lesions identified. IMPRESSION: 1. 4 x 5 mm obstructing calculus in the proximal right ureter. Mild right hydronephrosis. 2. Bilater

## 2021-10-27 NOTE — ED Notes (Signed)
Pt desatting to 85-89. Placed on 2L [place]. ?

## 2021-10-30 DIAGNOSIS — N201 Calculus of ureter: Secondary | ICD-10-CM | POA: Diagnosis not present

## 2021-10-30 DIAGNOSIS — R8271 Bacteriuria: Secondary | ICD-10-CM | POA: Diagnosis not present

## 2021-11-02 DIAGNOSIS — N200 Calculus of kidney: Secondary | ICD-10-CM | POA: Diagnosis not present

## 2021-11-04 ENCOUNTER — Telehealth: Payer: Self-pay | Admitting: Cardiovascular Disease

## 2021-11-04 NOTE — Telephone Encounter (Signed)
Pt has appt 11/05/21 with Dr. Angelena Form. Will forward notes to MD for upcoming appt. Will send FYI to requesting office pt has appt 11/05/21. Once Dr. Angelena Form clears the pt he will have his nurse fax over the clearance notes. All information can be obtained in the chart with the pre op clearance information.  ?

## 2021-11-04 NOTE — Telephone Encounter (Signed)
? ?  Name: [name]  ?DOB: 02-Mar-1936  ?MRN: [id number] ? ?Primary Cardiologist: None ? ?Chart reviewed as part of pre-operative protocol coverage. Because of Damari Suastegui Beason's past medical history and time since last visit, he will require an in-person follow-up visit in order to better assess preoperative cardiovascular risk. ? ?Pre-op covering staff: ?- Please schedule appointment and call patient to inform them. If patient already had an upcoming appointment within acceptable timeframe, please add "pre-op clearance" to the appointment notes so provider is aware. ?- Please contact requesting surgeon's office via preferred method (i.e, phone, fax) to inform them of need for appointment prior to surgery. ? ?If applicable, this message will also be routed to pharmacy pool and/or primary cardiologist for input on holding anticoagulant/antiplatelet agent as requested below so that this information is available to the clearing provider at time of patient's appointment.  ? ?Lenna Sciara, NP  ?11/04/2021, 2:54 PM  ? ?

## 2021-11-04 NOTE — Telephone Encounter (Signed)
? ?  Pre-operative Risk Assessment  ?  ?Patient Name: [name]  ?DOB: 1935-08-24 ?MRN: [id number]  ? ?  ? ?Request for Surgical Clearance   ? ?Procedure:   Right extra caporal shock wave lithotripsy ? ?Date of Surgery:  Clearance TBD                              ?   ?Surgeon:  Dr. Junious Silk ?Surgeon's Group or Practice Name:  [facility] ?Phone number:  [phone] ?Fax number:  [phone] ?  ?Type of Clearance Requested:   ?- Medical  ?- Pharmacy:  Hold Aspirin 72 hours prior ?  ?Type of Anesthesia:  Local  ?  ?Additional requests/questions:   n/a ? ?Signed, ?Kamira J Martinique   ?11/04/2021, 11:23 AM  ? ?

## 2021-11-05 ENCOUNTER — Encounter: Payer: Self-pay | Admitting: Cardiovascular Disease

## 2021-11-05 ENCOUNTER — Ambulatory Visit: Payer: Medicare HMO | Admitting: Cardiovascular Disease

## 2021-11-05 VITALS — BP 136/80 | HR 85 | Ht 68.0 in | Wt 175.2 lb

## 2021-11-05 DIAGNOSIS — I10 Essential (primary) hypertension: Secondary | ICD-10-CM | POA: Diagnosis not present

## 2021-11-05 DIAGNOSIS — I2510 Atherosclerotic heart disease of native coronary artery without angina pectoris: Secondary | ICD-10-CM | POA: Diagnosis not present

## 2021-11-05 DIAGNOSIS — I4819 Other persistent atrial fibrillation: Secondary | ICD-10-CM | POA: Diagnosis not present

## 2021-11-05 DIAGNOSIS — E7800 Pure hypercholesterolemia, unspecified: Secondary | ICD-10-CM

## 2021-11-05 DIAGNOSIS — Z01810 Encounter for preprocedural cardiovascular examination: Secondary | ICD-10-CM | POA: Diagnosis not present

## 2021-11-05 MED ORDER — APIXABAN 5 MG PO TABS: 5.0000 mg | ORAL_TABLET | Freq: Two times a day (BID) | ORAL | 5 refills | Status: DC

## 2021-11-05 NOTE — Progress Notes (Addendum)
? ? ?Chief Complaint  ?Patient presents with  ? Follow-up  ?  CAD, pre-operative risk assessment  ?New patient given 3 year absence since follow up ? ?History of Present Illness: 86 yo male with history of CAD, arthritis, GERD, HTN here today for pre-operative cardiovascular examination. He has been followed in our office by Dr. Percival Spanish. He is known to have CAD and has had prior balloon angioplasty of the Circumflex artery. Last cardiac cath in 2003 with non-obstructive disease. Stress test in 2016 with no ischemia. Echo in 2016 with LVEF=55-60% and aortic valve sclerosis. He was last seen by Dr. Percival Spanish in 2020. He has an upcoming lithotripsy for treatment of kidney stones. Clearance requested to hold ASA.  ? ?He has no chest pain or dyspnea. He is very active. Overall very functional for his age. He had severe right flank pain last week. He is expecting that he will need a procedure within the next week to remove the kidney stone. His EKG today shows atrial fibrillation which is a new finding.  ? ?Primary Care Physician: Shirline Frees, MD ?Primary Cardiologist: Dr. Percival Spanish ? ? ?Past Medical History:  ?Diagnosis Date  ? Arthritis   ? knees,back  ? CAD (coronary artery disease) CARDIOLOGIST-  [facility]  ? previous myocardial infarction in 2001 with 90% circumflex lesion treated with PTCA. The LAD had 70-80% stenosis which was treawted with angioplasty. Most recent catheterization in 2003 demonstrated the LAD at 30% mid stenosis, circumflex 60-70% stenosis in the mid AV grove, and 40% stenosis in the previously stented area. The right coronary artery had 30-40% stenosis in the mid portion.   ? GERD (gastroesophageal reflux disease)   ? Heart murmur   ? History of kidney stones   ? Hypertension   ? Left ureteral stone   ? Myocardial infarction [facility])   ? Pre-diabetes   ? Skin cancer   ? face  ? ? ?Past Surgical History:  ?Procedure Laterality Date  ? APPENDECTOMY  as teen  ? CARDIAC CATHETERIZATION  03-29-2000    dr hochrein  ? Nonobstuctive CAD /   CFX patent stent and LAD diffuse luminal irregularities including mid segment previously been angioplastied  ? CARDIOVASCULAR STRESS TEST  05-09-2015   dr hochrein  ? low risk perfusion study/  no reversible ischemia/  normal LV function and wall motion , ef 63%  ? CATARACT EXTRACTION W/ INTRAOCULAR LENS  IMPLANT, BILATERAL  left 05-06-2015/  right 06-10-2015  ? CORONARY ANGIOPLASTY WITH STENT PLACEMENT  01-21-2000  dr hochrein  ? PTCA to pLAD/  PTCA and BMS x1 to mid LCFX/  normal LVSF  ? CORONARY ANGIOPLASTY WITH STENT PLACEMENT  01-10-2002  dr Lyndel Safe  ? PTCA and BMS to mid AV LCFX (in-stent restenosis)/  diffuse 30-40% mRCA ,  mLAD diffuse 30%,  normal LVSF, ef 65%  ? CYSTOSCOPY/RETROGRADE/URETEROSCOPY/STONE EXTRACTION WITH BASKET Left 05/23/2015  ? Procedure: CYSTOSCOPY, FULGERATION OF PROSTATE,  LEFT URETEROSCOPY, JJ STENT PLACEMENT;  Surgeon: Festus Aloe, MD;  Location: [facility];  Service: Urology;  Laterality: Left;  ? ELECTROPHYSIOLOGIC STUDY  03-30-2000   dr Caryl Comes  ? EXTRACORPOREAL SHOCK WAVE LITHOTRIPSY Left 10-30-2012  ? HOLMIUM LASER APPLICATION Left 55/73/2202  ? Procedure: WITH HOLMIUM LASER LITHOTRIPSY;  Surgeon: Festus Aloe, MD;  Location: [facility];  Service: Urology;  Laterality: Left;  ? KNEE ARTHROSCOPY Bilateral x2 right/  left 2015  ? LUMBAR LAMINECTOMY/DECOMPRESSION MICRODISCECTOMY N/A 09/28/2012  ? Procedure: L3  -L5 DECOMPRESSION L4 -  L5 FUSION WITH INSTRUMENTATION 2 LEVELS;  Surgeon: Melina Schools, MD;  Location: [place];  Service: Orthopedics;  Laterality: N/A;  ? TONSILLECTOMY  as child  ? TOTAL KNEE ARTHROPLASTY Left 10/02/2018  ? Procedure: TOTAL LEFT  KNEE ARTHROPLASTY;  Surgeon: Paralee Cancel, MD;  Location: [facility];  Service: Orthopedics;  Laterality: Left;  90 mins- Ok per [employer]  ? TOTAL SHOULDER ARTHROPLASTY Right 01/14/2011  ? TRANSTHORACIC ECHOCARDIOGRAM  05-21-2015  ? mild LVH,  ef 55-605,  grade 2  diastolic dysfunction, basal inferior hyperkinesis/  moderate AV calcification sclerosis without stenosis/  trivial MR/  moderate LAE/  mild TR/  mild RAE  ? ? ?Current Outpatient Medications  ?Medication Sig Dispense Refill  ? apixaban (ELIQUIS) 5 MG TABS tablet Take 1 tablet (5 mg total) by mouth 2 (two) times daily. 60 tablet 5  ? aspirin EC 81 MG tablet Take 81 mg by mouth daily. Swallow whole.    ? atorvastatin (LIPITOR) 80 MG tablet Take 1 tablet (80 mg total) by mouth at bedtime. 90 tablet 3  ? Cholecalciferol (VITAMIN D3 PO) Take by mouth daily.    ? Cyanocobalamin (B-12 PO) Take by mouth daily.    ? docusate sodium (COLACE) 100 MG capsule Take 1 capsule (100 mg total) by mouth 2 (two) times daily. 10 capsule 0  ? esomeprazole (NEXIUM) 20 MG capsule Take 20 mg by mouth daily before breakfast.     ? ferrous sulfate (FERROUSUL) 325 (65 FE) MG tablet Take 1 tablet (325 mg total) by mouth 3 (three) times daily with meals.  3  ? folic acid (FOLVITE) 782 MCG tablet Take 800 mcg by mouth daily.     ? levothyroxine (SYNTHROID) 25 MCG tablet Take 25 mcg by mouth daily before breakfast.    ? methocarbamol (ROBAXIN) 500 MG tablet Take 1 tablet (500 mg total) by mouth every 6 (six) hours as needed for muscle spasms. 40 tablet 0  ? metoprolol tartrate (LOPRESSOR) 25 MG tablet Take 1 tablet (25 mg total) by mouth 2 (two) times daily. 180 tablet 3  ? niacin 500 MG tablet Take 500 mg by mouth daily.    ? nitroGLYCERIN (NITROSTAT) 0.4 MG SL tablet Place 0.4 mg under the tongue every 5 (five) minutes as needed for chest pain.    ? ondansetron (ZOFRAN) 4 MG tablet Take 1 tablet (4 mg total) by mouth every 6 (six) hours. 12 tablet 0  ? polyethylene glycol (MIRALAX / GLYCOLAX) packet Take 17 g by mouth 2 (two) times daily. 14 each 0  ? tamsulosin (FLOMAX) 0.4 MG CAPS capsule Take 1 capsule (0.4 mg total) by mouth daily after supper. 14 capsule 0  ? traMADol (ULTRAM) 50 MG tablet Take 1 tablet (50 mg total) by mouth every 6 (six)  hours as needed. 15 tablet 0  ? ?No current facility-administered medications for this visit.  ? ? ?No Known Allergies ? ?Social History  ? ?Socioeconomic History  ? Marital status: Married  ?  Spouse name: Not on file  ? Number of children: 2  ? Years of education: Not on file  ? Highest education level: Not on file  ?Occupational History  ? Occupation: retired  ?Tobacco Use  ? Smoking status: Former  ?  Packs/day: 1.00  ?  Years: 3.00  ?  Pack years: 3.00  ?  Types: Cigarettes  ?  Quit date: 09/26/1962  ?  Years since quitting: 59.1  ? Smokeless tobacco: Never  ?Vaping Use  ?  Vaping Use: Never used  ?Substance and Sexual Activity  ? Alcohol use: No  ? Drug use: No  ? Sexual activity: Not Currently  ?Other Topics Concern  ? Not on file  ?Social History Narrative  ? Lives at home with wife.  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    ? ?Social Determinants of Health  ? ?Financial Resource Strain: Not on file  ?Food Insecurity: Not on file  ?Transportation Needs: Not on file  ?Physical Activity: Not on file  ?Stress: Not on file  ?Social Connections: Not on file  ?Intimate Partner Violence: Not on file  ? ? ?Family History  ?Problem Relation Age of Onset  ? Heart failure Mother   ? Prostate cancer Father   ? ? ?Review of Systems:  As stated in the HPI and otherwise negative.  ? ?BP 136/80   Pulse 85   Ht '5\' 8"'$  (1.727 m)   Wt 175 lb 3.2 oz (79.5 kg)   SpO2 99%   BMI 26.64 kg/m?  ? ?Physical Examination: ?General: Well developed, well nourished, NAD  ?HEENT: OP clear, mucus membranes moist  ?SKIN: warm, dry. No rashes. ?Neuro: No focal deficits  ?Musculoskeletal: Muscle strength 5/5 all ext  ?Psychiatric: Mood and affect normal  ?Neck: No JVD, no carotid bruits, no thyromegaly, no lymphadenopathy.  ?Lungs:Clear bilaterally, no wheezes, rhonci, crackles ?Cardiovascular: Irreg irreg. Systolic murmur ?Abdomen:Soft. Bowel sounds present. Non-tender.  ?Extremities: No lower extremity edema. Pulses are 2 + in the bilateral DP/PT. ? ?EKG:   EKG is ordered today. ?The ekg ordered today demonstrates Atrial fib, rate 85 bpm ? ?Recent Labs: ?10/27/2021: ALT 21; BUN 18; Creatinine, Ser 1.63; Hemoglobin 12.4; Platelets 139; Potassium 3.8; Sodium 139

## 2021-11-05 NOTE — Addendum Note (Signed)
Addended by: Rodman Key on: 11/05/2021 03:10 PM ? ? Modules accepted: Orders ? ?

## 2021-11-05 NOTE — Patient Instructions (Addendum)
Medication Instructions:  ?Your physician has recommended you make the following change in your medication:  ?1.) lets plan on you starting Eliquis 5 mg - one tablet by mouth twice daily--starting based on what day your kidney stone procedure will be.   ?2.) stop aspirin when you start Elqius ? ? ?*If you need a refill on your cardiac medications before your next appointment, please call your pharmacy* ? ? ?Lab Work: ?Today: BMET ? ?If you have labs (blood work) drawn today and your tests are completely normal, you will receive your results only by: ?MyChart Message (if you have MyChart) OR ?A paper copy in the mail ?If you have any lab test that is abnormal or we need to change your treatment, we will call you to review the results. ? ? ?Testing/Procedures: ?Your physician has requested that you have an echocardiogram. Echocardiography is a painless test that uses sound waves to create images of your heart. It provides your doctor with information about the size and shape of your heart and how well your heart?s chambers and valves are working. This procedure takes approximately one hour. There are no restrictions for this procedure. ? ? ?Follow-Up: ?At [facility], you and your health needs are our priority.  As part of our continuing mission to provide you with exceptional heart care, we have created designated Provider Care Teams.  These Care Teams include your primary Cardiologist (physician) and Advanced Practice Providers (APPs -  Physician Assistants and Nurse Practitioners) who all work together to provide you with the care you need, when you need it. ? ?We recommend signing up for the patient portal called "MyChart".  Sign up information is provided on this After Visit Summary.  MyChart is used to connect with patients for Virtual Visits (Telemedicine).  Patients are able to view lab/test results, encounter notes, upcoming appointments, etc.  Non-urgent messages can be sent to your provider as well.    ?To learn more about what you can do with MyChart, go to [email or web address].   ? ?Your next appointment:   ?4 week(s) ? ?The format for your next appointment:   ?In Person ? ?Provider:   ?Minus Breeding, MD or [facility] ? ? ?Other Instructions ?  ?

## 2021-11-06 LAB — BASIC METABOLIC PANEL
BUN/Creatinine Ratio: 13 (ref 10–24)
BUN: 19 mg/dL (ref 8–27)
CO2: 18 mmol/L — ABNORMAL LOW (ref 20–29)
Calcium: 9.2 mg/dL (ref 8.6–10.2)
Chloride: 106 mmol/L (ref 96–106)
Creatinine, Ser: 1.5 mg/dL — ABNORMAL HIGH (ref 0.76–1.27)
Glucose: 101 mg/dL — ABNORMAL HIGH (ref 70–99)
Potassium: 4.8 mmol/L (ref 3.5–5.2)
Sodium: 141 mmol/L (ref 134–144)
eGFR: 46 mL/min/{1.73_m2} — ABNORMAL LOW (ref >59)

## 2021-11-10 ENCOUNTER — Other Ambulatory Visit: Payer: Self-pay | Admitting: Urology

## 2021-11-12 ENCOUNTER — Encounter (HOSPITAL_BASED_OUTPATIENT_CLINIC_OR_DEPARTMENT_OTHER): Payer: Self-pay | Admitting: Urology

## 2021-11-12 NOTE — Progress Notes (Signed)
Patient to arrive at 0600 on 11/16/2021. History and medications reviewed. Pre-procedure instructions given. Patient has not started his eliquis that was recently prescribed. He will stop ASA as instructed tomorrow. NPO after MN on Sunday. Will take his lopressor and synthroid with sip of water in am of procedure. Driver secured. ?

## 2021-11-16 ENCOUNTER — Encounter (HOSPITAL_BASED_OUTPATIENT_CLINIC_OR_DEPARTMENT_OTHER): Payer: Self-pay | Admitting: Urology

## 2021-11-16 ENCOUNTER — Other Ambulatory Visit: Payer: Self-pay

## 2021-11-16 ENCOUNTER — Encounter (HOSPITAL_BASED_OUTPATIENT_CLINIC_OR_DEPARTMENT_OTHER)
Admission: RE | Disposition: A | Discharge status: Home / Self Care | Payer: Self-pay | Source: Home / Self Care | Attending: Urology

## 2021-11-16 ENCOUNTER — Ambulatory Visit (HOSPITAL_BASED_OUTPATIENT_CLINIC_OR_DEPARTMENT_OTHER)
Admission: RE | Admit: 2021-11-16 | Discharge: 2021-11-16 | Disposition: A | Discharge status: Home / Self Care | Payer: Medicare HMO | Attending: Urology | Admitting: Urology

## 2021-11-16 ENCOUNTER — Ambulatory Visit (HOSPITAL_COMMUNITY): Payer: Medicare HMO

## 2021-11-16 DIAGNOSIS — Z01818 Encounter for other preprocedural examination: Secondary | ICD-10-CM | POA: Diagnosis not present

## 2021-11-16 DIAGNOSIS — N200 Calculus of kidney: Secondary | ICD-10-CM | POA: Diagnosis not present

## 2021-11-16 DIAGNOSIS — N202 Calculus of kidney with calculus of ureter: Secondary | ICD-10-CM | POA: Insufficient documentation

## 2021-11-16 DIAGNOSIS — N201 Calculus of ureter: Secondary | ICD-10-CM | POA: Diagnosis not present

## 2021-11-16 HISTORY — PX: EXTRACORPOREAL SHOCK WAVE LITHOTRIPSY: SHX1557

## 2021-11-16 SURGERY — LITHOTRIPSY, ESWL
Anesthesia: LOCAL | Laterality: Right

## 2021-11-16 MED ORDER — CIPROFLOXACIN HCL 500 MG PO TABS
500.0000 mg | ORAL_TABLET | Freq: Once | ORAL | Status: AC
  Administered 2021-11-16: 500 mg via ORAL

## 2021-11-16 MED ORDER — DIPHENHYDRAMINE HCL 25 MG PO CAPS
25.0000 mg | ORAL_CAPSULE | Freq: Once | ORAL | Status: AC
  Administered 2021-11-16: 25 mg via ORAL

## 2021-11-16 MED ORDER — DIAZEPAM 5 MG PO TABS
10.0000 mg | ORAL_TABLET | Freq: Once | ORAL | Status: AC
  Administered 2021-11-16: 10 mg via ORAL

## 2021-11-16 MED ORDER — SODIUM CHLORIDE 0.9 % IV SOLN: INTRAVENOUS | Status: DC

## 2021-11-16 MED ORDER — CIPROFLOXACIN HCL 500 MG PO TABS
ORAL_TABLET | ORAL | Status: AC
  Filled 2021-11-16: qty 1

## 2021-11-16 MED ORDER — DIPHENHYDRAMINE HCL 25 MG PO CAPS
ORAL_CAPSULE | ORAL | Status: AC
  Filled 2021-11-16: qty 1

## 2021-11-16 MED ORDER — DIAZEPAM 5 MG PO TABS
ORAL_TABLET | ORAL | Status: AC
  Filled 2021-11-16: qty 2

## 2021-11-16 NOTE — Discharge Instructions (Signed)
See [facility] discharge instructions in chart.  

## 2021-11-16 NOTE — H&P (Signed)
H&P ? ?Chief Complaint: Right-sided kidney stone ? ?History of Present Illness: 86 year old male presents for shockwave lithotripsy of a right distal ureteral calculus.  He knows that this is possibly the first part of a staged procedure. ? ?Past Medical History:  ?Diagnosis Date  ? Arthritis   ? knees,back  ? CAD (coronary artery disease) CARDIOLOGIST-  DR [facility]  ? previous myocardial infarction in 2001 with 90% circumflex lesion treated with PTCA. The LAD had 70-80% stenosis which was treawted with angioplasty. Most recent catheterization in 2003 demonstrated the LAD at 30% mid stenosis, circumflex 60-70% stenosis in the mid AV grove, and 40% stenosis in the previously stented area. The right coronary artery had 30-40% stenosis in the mid portion.   ? GERD (gastroesophageal reflux disease)   ? Heart murmur   ? History of kidney stones   ? Hypertension   ? Left ureteral stone   ? Myocardial infarction [facility])   ? Pre-diabetes   ? Skin cancer   ? face  ? ? ?Past Surgical History:  ?Procedure Laterality Date  ? APPENDECTOMY  as teen  ? CARDIAC CATHETERIZATION  03-29-2000   dr hochrein  ? Nonobstuctive CAD /   CFX patent stent and LAD diffuse luminal irregularities including mid segment previously been angioplastied  ? CARDIOVASCULAR STRESS TEST  05-09-2015   dr hochrein  ? low risk perfusion study/  no reversible ischemia/  normal LV function and wall motion , ef 63%  ? CATARACT EXTRACTION W/ INTRAOCULAR LENS  IMPLANT, BILATERAL  left 05-06-2015/  right 06-10-2015  ? CORONARY ANGIOPLASTY WITH STENT PLACEMENT  01-21-2000  dr hochrein  ? PTCA to pLAD/  PTCA and BMS x1 to mid LCFX/  normal LVSF  ? CORONARY ANGIOPLASTY WITH STENT PLACEMENT  01-10-2002  dr Lyndel Safe  ? PTCA and BMS to mid AV LCFX (in-stent restenosis)/  diffuse 30-40% mRCA ,  mLAD diffuse 30%,  normal LVSF, ef 65%  ? CYSTOSCOPY/RETROGRADE/URETEROSCOPY/STONE EXTRACTION WITH BASKET Left 05/23/2015  ? Procedure: CYSTOSCOPY, FULGERATION OF PROSTATE,  LEFT  URETEROSCOPY, JJ STENT PLACEMENT;  Surgeon: Festus Aloe, MD;  Location: [facility];  Service: Urology;  Laterality: Left;  ? ELECTROPHYSIOLOGIC STUDY  03-30-2000   dr Caryl Comes  ? EXTRACORPOREAL SHOCK WAVE LITHOTRIPSY Left 10-30-2012  ? HOLMIUM LASER APPLICATION Left 27/01/2375  ? Procedure: WITH HOLMIUM LASER LITHOTRIPSY;  Surgeon: Festus Aloe, MD;  Location: [facility];  Service: Urology;  Laterality: Left;  ? KNEE ARTHROSCOPY Bilateral x2 right/  left 2015  ? LUMBAR LAMINECTOMY/DECOMPRESSION MICRODISCECTOMY N/A 09/28/2012  ? Procedure: L3  -L5 DECOMPRESSION L4 - L5 FUSION WITH INSTRUMENTATION 2 LEVELS;  Surgeon: Melina Schools, MD;  Location: [place];  Service: Orthopedics;  Laterality: N/A;  ? TONSILLECTOMY  as child  ? TOTAL KNEE ARTHROPLASTY Left 10/02/2018  ? Procedure: TOTAL LEFT  KNEE ARTHROPLASTY;  Surgeon: Paralee Cancel, MD;  [facility];  Service: Orthopedics;  Laterality: Left;  90 mins- Ok per [employer]  ? TOTAL SHOULDER ARTHROPLASTY Right 01/14/2011  ? TRANSTHORACIC ECHOCARDIOGRAM  05-21-2015  ? mild LVH,  ef 55-605,  grade 2 diastolic dysfunction, basal inferior hyperkinesis/  moderate AV calcification sclerosis without stenosis/  trivial MR/  moderate LAE/  mild TR/  mild RAE  ? ? ?Home Medications:  ? ? ?Allergies: No Known Allergies ? ?Family History  ?Problem Relation Age of Onset  ? Heart failure Mother   ? Prostate cancer Father   ? ? ?Social History:  reports that he quit smoking  about 59 years ago. He has a 3.00 pack-year smoking history. He has never used smokeless tobacco. He reports that he does not drink alcohol and does not use drugs. ? ?ROS: ?A complete review of systems was performed.  All systems are negative except for pertinent findings as noted. ? ?Physical Exam:  ?Vital signs in last 24 hours: ?BP 138/76   Pulse 76   Temp 97.9 ?F (36.6 ?C) (Oral)   Resp 17   Ht '5\' 8"'$  (1.727 m)   Wt 76.2 kg   SpO2 95%   BMI 25.54 kg/m?  ?Constitutional:   Alert and oriented, No acute distress ?Cardiovascular: Regular rate  ?Respiratory: Normal respiratory effort ?Lymphatic: No lymphadenopathy ?Neurologic: Grossly intact, no focal deficits ?Psychiatric: Normal mood and affect ? ?I have reviewed prior pt notes ? ?I have reviewed notes from referring/previous physicians ? ?I have reviewed urinalysis results ? ?I have independently reviewed prior imaging ? ? ? ?Impression/Assessment:  ?Right distal ureteral calculus ? ?Plan:  ?ESWL ? ?

## 2021-11-17 ENCOUNTER — Ambulatory Visit (HOSPITAL_COMMUNITY): Payer: Medicare HMO | Attending: Cardiology

## 2021-11-17 ENCOUNTER — Encounter (HOSPITAL_BASED_OUTPATIENT_CLINIC_OR_DEPARTMENT_OTHER): Payer: Self-pay | Admitting: Urology

## 2021-11-17 DIAGNOSIS — I2510 Atherosclerotic heart disease of native coronary artery without angina pectoris: Secondary | ICD-10-CM | POA: Insufficient documentation

## 2021-11-17 DIAGNOSIS — I4891 Unspecified atrial fibrillation: Secondary | ICD-10-CM

## 2021-11-17 DIAGNOSIS — I10 Essential (primary) hypertension: Secondary | ICD-10-CM | POA: Diagnosis not present

## 2021-11-17 DIAGNOSIS — Z01810 Encounter for preprocedural cardiovascular examination: Secondary | ICD-10-CM

## 2021-11-17 DIAGNOSIS — E7800 Pure hypercholesterolemia, unspecified: Secondary | ICD-10-CM | POA: Diagnosis not present

## 2021-11-17 LAB — ECHOCARDIOGRAM COMPLETE: S' Lateral: 3.7 cm

## 2021-11-30 DIAGNOSIS — N201 Calculus of ureter: Secondary | ICD-10-CM | POA: Diagnosis not present

## 2021-12-02 DIAGNOSIS — I10 Essential (primary) hypertension: Secondary | ICD-10-CM | POA: Insufficient documentation

## 2021-12-02 DIAGNOSIS — I4819 Other persistent atrial fibrillation: Secondary | ICD-10-CM | POA: Insufficient documentation

## 2021-12-02 DIAGNOSIS — E785 Hyperlipidemia, unspecified: Secondary | ICD-10-CM | POA: Insufficient documentation

## 2021-12-02 NOTE — H&P (View-Only) (Signed)
?  ?Cardiology Office Note ? ? ?Date:  12/03/2021  ? ?ID:  [name], DOB [birth year], MRN [id number] ? ?PCP:  Shirline Frees, MD  ?Cardiologist:   Minus Breeding, MD ?Referring:  Shirline Frees, MD ? ?Chief Complaint  ?Patient presents with  ? Shortness of Breath  ? ? ?  ?History of Present Illness: ?[name] is a 86 y.o. male who presents for preop evaluation.  He has a cardiac history with previous MI in 2001 with 90% circ lesion treated with PTCA.  LAD had 55 - 80% stenosis He was last cathed in 2003 with non obstructive disease.  He had a stress perfusion study prior to another surgery in 2016 that was normal.   He was diagnosed recently with new onset atrial fib.  He had severe MR on his echo.  His EF was 55 to 60%.  He did have severely dilated left atrium.  There was some aortic sclerosis. ? ?He says that he has been short of breath walking to his mailbox which is maybe 50 feet.  He is not having any PND or orthopnea.  He recovers quickly.  He is not really noticing his heart racing or skipping he just feels short of breath.  He says this happened relatively recently over the last 3 to 4 months.  He is not describing chest pressure, neck or arm discomfort.  Has not had any weight gain or edema. ? ?He was being seen for lithotripsy but he did not have to have this as the stone passed into his bladder. ? ?Of note he has not been as active as he has been the caregiver for his wife.  Has been a little slower because of this. ? ? ?Past Medical History:  ?Diagnosis Date  ? Arthritis   ? knees,back  ? CAD (coronary artery disease) CARDIOLOGIST-  DR [facility]  ? previous myocardial infarction in 2001 with 90% circumflex lesion treated with PTCA. The LAD had 70-80% stenosis which was treawted with angioplasty. Most recent catheterization in 2003 demonstrated the LAD at 30% mid stenosis, circumflex 60-70% stenosis in the mid AV grove, and 40% stenosis in the previously stented area. The right coronary artery  had 30-40% stenosis in the mid portion.   ? GERD (gastroesophageal reflux disease)   ? Heart murmur   ? History of kidney stones   ? Hypertension   ? Left ureteral stone   ? Myocardial infarction [facility])   ? Pre-diabetes   ? Skin cancer   ? face  ? ? ?Past Surgical History:  ?Procedure Laterality Date  ? APPENDECTOMY  as teen  ? CARDIAC CATHETERIZATION  03-29-2000   dr Mar Walmer  ? Nonobstuctive CAD /   CFX patent stent and LAD diffuse luminal irregularities including mid segment previously been angioplastied  ? CARDIOVASCULAR STRESS TEST  05-09-2015   dr Leana Springston  ? low risk perfusion study/  no reversible ischemia/  normal LV function and wall motion , ef 63%  ? CATARACT EXTRACTION W/ INTRAOCULAR LENS  IMPLANT, BILATERAL  left 05-06-2015/  right 06-10-2015  ? CORONARY ANGIOPLASTY WITH STENT PLACEMENT  01-21-2000  dr Federick Levene  ? PTCA to pLAD/  PTCA and BMS x1 to mid LCFX/  normal LVSF  ? CORONARY ANGIOPLASTY WITH STENT PLACEMENT  01-10-2002  dr Lyndel Safe  ? PTCA and BMS to mid AV LCFX (in-stent restenosis)/  diffuse 30-40% mRCA ,  mLAD diffuse 30%,  normal LVSF, ef 65%  ? CYSTOSCOPY/RETROGRADE/URETEROSCOPY/STONE EXTRACTION WITH BASKET Left 05/23/2015  ?  Procedure: CYSTOSCOPY, FULGERATION OF PROSTATE,  LEFT URETEROSCOPY, JJ STENT PLACEMENT;  Surgeon: Festus Aloe, MD;  Location: [facility];  Service: Urology;  Laterality: Left;  ? ELECTROPHYSIOLOGIC STUDY  03-30-2000   dr Caryl Comes  ? EXTRACORPOREAL SHOCK WAVE LITHOTRIPSY Left 10-30-2012  ? EXTRACORPOREAL SHOCK WAVE LITHOTRIPSY Right 11/16/2021  ? Procedure: EXTRACORPOREAL SHOCK WAVE LITHOTRIPSY (ESWL);  Surgeon: Franchot Gallo, MD;  Location: [facility];  Service: Urology;  Laterality: Right;  ? HOLMIUM LASER APPLICATION Left 75/17/0017  ? Procedure: WITH HOLMIUM LASER LITHOTRIPSY;  Surgeon: Festus Aloe, MD;  [facility];  Service: Urology;  Laterality: Left;  ? KNEE ARTHROSCOPY Bilateral x2 right/  left  2015  ? LUMBAR LAMINECTOMY/DECOMPRESSION MICRODISCECTOMY N/A 09/28/2012  ? Procedure: L3  -L5 DECOMPRESSION L4 - L5 FUSION WITH INSTRUMENTATION 2 LEVELS;  Surgeon: Melina Schools, MD;  Location: [place];  Service: Orthopedics;  Laterality: N/A;  ? TONSILLECTOMY  as child  ? TOTAL KNEE ARTHROPLASTY Left 10/02/2018  ? Procedure: TOTAL LEFT  KNEE ARTHROPLASTY;  Surgeon: Paralee Cancel, MD;  [facility];  Service: Orthopedics;  Laterality: Left;  90 mins- Ok per [employer]  ? TOTAL SHOULDER ARTHROPLASTY Right 01/14/2011  ? TRANSTHORACIC ECHOCARDIOGRAM  05-21-2015  ? mild LVH,  ef 55-605,  grade 2 diastolic dysfunction, basal inferior hyperkinesis/  moderate AV calcification sclerosis without stenosis/  trivial MR/  moderate LAE/  mild TR/  mild RAE  ? ? ? ?Current Outpatient Medications  ?Medication Sig Dispense Refill  ? apixaban (ELIQUIS) 5 MG TABS tablet Take 1 tablet (5 mg total) by mouth 2 (two) times daily. 60 tablet 5  ? atorvastatin (LIPITOR) 80 MG tablet Take 1 tablet (80 mg total) by mouth at bedtime. 90 tablet 3  ? Cholecalciferol (VITAMIN D3 PO) Take by mouth daily.    ? Cyanocobalamin (B-12 PO) Take by mouth daily.    ? docusate sodium (COLACE) 100 MG capsule Take 1 capsule (100 mg total) by mouth 2 (two) times daily. 10 capsule 0  ? esomeprazole (NEXIUM) 20 MG capsule Take 20 mg by mouth daily before breakfast.     ? ferrous sulfate (FERROUSUL) 325 (65 FE) MG tablet Take 1 tablet (325 mg total) by mouth 3 (three) times daily with meals.  3  ? folic acid (FOLVITE) 494 MCG tablet Take 800 mcg by mouth daily.     ? levothyroxine (SYNTHROID) 25 MCG tablet Take 25 mcg by mouth daily before breakfast.    ? methocarbamol (ROBAXIN) 500 MG tablet Take 1 tablet (500 mg total) by mouth every 6 (six) hours as needed for muscle spasms. 40 tablet 0  ? metoprolol tartrate (LOPRESSOR) 25 MG tablet Take 1 tablet (25 mg total) by mouth 2 (two) times daily. 180 tablet 3  ? niacin 500 MG tablet Take 500 mg by mouth daily.    ?  nitroGLYCERIN (NITROSTAT) 0.4 MG SL tablet Place 0.4 mg under the tongue every 5 (five) minutes as needed for chest pain.    ? ondansetron (ZOFRAN) 4 MG tablet Take 1 tablet (4 mg total) by mouth every 6 (six) hours. 12 tablet 0  ? oxyCODONE-acetaminophen (PERCOCET/ROXICET) 5-325 MG tablet Take 1 tablet by mouth every 6 (six) hours as needed for severe pain.    ? polyethylene glycol (MIRALAX / GLYCOLAX) packet Take 17 g by mouth 2 (two) times daily. 14 each 0  ? traMADol (ULTRAM) 50 MG tablet Take 1 tablet (50 mg total) by mouth every 6 (six) hours  as needed. 15 tablet 0  ? ?No current facility-administered medications for this visit.  ? ? ?Allergies:   Patient has no known allergies.  ? ? ?ROS:  Please see the history of present illness.   Otherwise, review of systems are positive for none.   All other systems are reviewed and negative.  ? ? ?PHYSICAL EXAM: ?VS:  BP 138/70   Pulse (!) 58   Ht '5\' 8"'$  (1.727 m)   Wt 172 lb (78 kg)   SpO2 100%   BMI 26.15 kg/m?  , BMI Body mass index is 26.15 kg/m?. ?GENERAL:  Well appearing ?NECK:  Positive jugular venous distention, waveform within normal limits, carotid upstroke brisk and symmetric, no bruits, no thyromegaly ?LUNGS:  Clear to auscultation bilaterally ?CHEST:  Unremarkable ?HEART:  PMI not displaced or sustained,S1 and S2 within normal limits, no S3, no clicks, no rubs, 3 out of 6 holosystolic murmur heard at the apex and radiating to to the axilla, no diastolic murmurs, irregular  ?ABD:  Flat, positive bowel sounds normal in frequency in pitch, no bruits, no rebound, no guarding, no midline pulsatile mass, no hepatomegaly, no splenomegaly ?EXT:  2 plus pulses throughout, no edema, no cyanosis no clubbing ? ? ?EKG:  EKG is not ordered today. ?NA ? ? ?Recent Labs: ?10/27/2021: ALT 21; Hemoglobin 12.4; Platelets 139 ?11/05/2021: BUN 19; Creatinine, Ser 1.50; Potassium 4.8; Sodium 141  ? ? ? ?Wt Readings from Last 3 Encounters:  ?12/03/21 172 lb (78 kg)  ?11/16/21 168  lb (76.2 kg)  ?11/05/21 175 lb 3.2 oz (79.5 kg)  ?  ? ? ?Other studies Reviewed: ?Additional studies/ records that were reviewed today include: Echo ?Review of the above records demonstrates:  Please see elsewh

## 2021-12-02 NOTE — Progress Notes (Signed)
?  ?Cardiology Office Note ? ? ?Date:  12/03/2021  ? ?ID:  [name], DOB 01-May-1936, MRN [id number] ? ?PCP:  Shirline Frees, MD  ?Cardiologist:   Minus Breeding, MD ?Referring:  Shirline Frees, MD ? ?Chief Complaint  ?Patient presents with  ? Shortness of Breath  ? ? ?  ?History of Present Illness: ?[name] is a 86 y.o. male who presents for preop evaluation.  He has a cardiac history with previous MI in 2001 with 90% circ lesion treated with PTCA.  LAD had 24 - 80% stenosis He was last cathed in 2003 with non obstructive disease.  He had a stress perfusion study prior to another surgery in 2016 that was normal.   He was diagnosed recently with new onset atrial fib.  He had severe MR on his echo.  His EF was 55 to 60%.  He did have severely dilated left atrium.  There was some aortic sclerosis. ? ?He says that he has been short of breath walking to his mailbox which is maybe 50 feet.  He is not having any PND or orthopnea.  He recovers quickly.  He is not really noticing his heart racing or skipping he just feels short of breath.  He says this happened relatively recently over the last 3 to 4 months.  He is not describing chest pressure, neck or arm discomfort.  Has not had any weight gain or edema. ? ?He was being seen for lithotripsy but he did not have to have this as the stone passed into his bladder. ? ?Of note he has not been as active as he has been the caregiver for his wife.  Has been a little slower because of this. ? ? ?Past Medical History:  ?Diagnosis Date  ? Arthritis   ? knees,back  ? CAD (coronary artery disease) CARDIOLOGIST-  DR [facility]  ? previous myocardial infarction in 2001 with 90% circumflex lesion treated with PTCA. The LAD had 70-80% stenosis which was treawted with angioplasty. Most recent catheterization in 2003 demonstrated the LAD at 30% mid stenosis, circumflex 60-70% stenosis in the mid AV grove, and 40% stenosis in the previously stented area. The right coronary artery  had 30-40% stenosis in the mid portion.   ? GERD (gastroesophageal reflux disease)   ? Heart murmur   ? History of kidney stones   ? Hypertension   ? Left ureteral stone   ? Myocardial infarction [facility])   ? Pre-diabetes   ? Skin cancer   ? face  ? ? ?Past Surgical History:  ?Procedure Laterality Date  ? APPENDECTOMY  as teen  ? CARDIAC CATHETERIZATION  03-29-2000   dr Ellese Julius  ? Nonobstuctive CAD /   CFX patent stent and LAD diffuse luminal irregularities including mid segment previously been angioplastied  ? CARDIOVASCULAR STRESS TEST  05-09-2015   dr Aryonna Gunnerson  ? low risk perfusion study/  no reversible ischemia/  normal LV function and wall motion , ef 63%  ? CATARACT EXTRACTION W/ INTRAOCULAR LENS  IMPLANT, BILATERAL  left 05-06-2015/  right 06-10-2015  ? CORONARY ANGIOPLASTY WITH STENT PLACEMENT  01-21-2000  dr Tasheena Wambolt  ? PTCA to pLAD/  PTCA and BMS x1 to mid LCFX/  normal LVSF  ? CORONARY ANGIOPLASTY WITH STENT PLACEMENT  01-10-2002  dr Lyndel Safe  ? PTCA and BMS to mid AV LCFX (in-stent restenosis)/  diffuse 30-40% mRCA ,  mLAD diffuse 30%,  normal LVSF, ef 65%  ? CYSTOSCOPY/RETROGRADE/URETEROSCOPY/STONE EXTRACTION WITH BASKET Left 05/23/2015  ?  Procedure: CYSTOSCOPY, FULGERATION OF PROSTATE,  LEFT URETEROSCOPY, JJ STENT PLACEMENT;  Surgeon: Festus Aloe, MD;  [facility];  Service: Urology;  Laterality: Left;  ? ELECTROPHYSIOLOGIC STUDY  03-30-2000   dr Caryl Comes  ? EXTRACORPOREAL SHOCK WAVE LITHOTRIPSY Left 10-30-2012  ? EXTRACORPOREAL SHOCK WAVE LITHOTRIPSY Right 11/16/2021  ? Procedure: EXTRACORPOREAL SHOCK WAVE LITHOTRIPSY (ESWL);  Surgeon: Franchot Gallo, MD;  Location[facility];  Service: Urology;  Laterality: Right;  ? HOLMIUM LASER APPLICATION Left 88/50/2774  ? Procedure: WITH HOLMIUM LASER LITHOTRIPSY;  Surgeon: Festus Aloe, MD;  Location: [facility];  Service: Urology;  Laterality: Left;  ? KNEE ARTHROSCOPY Bilateral x2 right/  left  2015  ? LUMBAR LAMINECTOMY/DECOMPRESSION MICRODISCECTOMY N/A 09/28/2012  ? Procedure: L3  -L5 DECOMPRESSION L4 - L5 FUSION WITH INSTRUMENTATION 2 LEVELS;  Surgeon: Melina Schools, MD;  Location: [place];  Service: Orthopedics;  Laterality: N/A;  ? TONSILLECTOMY  as child  ? TOTAL KNEE ARTHROPLASTY Left 10/02/2018  ? Procedure: TOTAL LEFT  KNEE ARTHROPLASTY;  Surgeon: Paralee Cancel, MD;  [facility];  Service: Orthopedics;  Laterality: Left;  90 mins- Ok per [employer]  ? TOTAL SHOULDER ARTHROPLASTY Right 01/14/2011  ? TRANSTHORACIC ECHOCARDIOGRAM  05-21-2015  ? mild LVH,  ef 55-605,  grade 2 diastolic dysfunction, basal inferior hyperkinesis/  moderate AV calcification sclerosis without stenosis/  trivial MR/  moderate LAE/  mild TR/  mild RAE  ? ? ? ?Current Outpatient Medications  ?Medication Sig Dispense Refill  ? apixaban (ELIQUIS) 5 MG TABS tablet Take 1 tablet (5 mg total) by mouth 2 (two) times daily. 60 tablet 5  ? atorvastatin (LIPITOR) 80 MG tablet Take 1 tablet (80 mg total) by mouth at bedtime. 90 tablet 3  ? Cholecalciferol (VITAMIN D3 PO) Take by mouth daily.    ? Cyanocobalamin (B-12 PO) Take by mouth daily.    ? docusate sodium (COLACE) 100 MG capsule Take 1 capsule (100 mg total) by mouth 2 (two) times daily. 10 capsule 0  ? esomeprazole (NEXIUM) 20 MG capsule Take 20 mg by mouth daily before breakfast.     ? ferrous sulfate (FERROUSUL) 325 (65 FE) MG tablet Take 1 tablet (325 mg total) by mouth 3 (three) times daily with meals.  3  ? folic acid (FOLVITE) 128 MCG tablet Take 800 mcg by mouth daily.     ? levothyroxine (SYNTHROID) 25 MCG tablet Take 25 mcg by mouth daily before breakfast.    ? methocarbamol (ROBAXIN) 500 MG tablet Take 1 tablet (500 mg total) by mouth every 6 (six) hours as needed for muscle spasms. 40 tablet 0  ? metoprolol tartrate (LOPRESSOR) 25 MG tablet Take 1 tablet (25 mg total) by mouth 2 (two) times daily. 180 tablet 3  ? niacin 500 MG tablet Take 500 mg by mouth daily.    ?  nitroGLYCERIN (NITROSTAT) 0.4 MG SL tablet Place 0.4 mg under the tongue every 5 (five) minutes as needed for chest pain.    ? ondansetron (ZOFRAN) 4 MG tablet Take 1 tablet (4 mg total) by mouth every 6 (six) hours. 12 tablet 0  ? oxyCODONE-acetaminophen (PERCOCET/ROXICET) 5-325 MG tablet Take 1 tablet by mouth every 6 (six) hours as needed for severe pain.    ? polyethylene glycol (MIRALAX / GLYCOLAX) packet Take 17 g by mouth 2 (two) times daily. 14 each 0  ? traMADol (ULTRAM) 50 MG tablet Take 1 tablet (50 mg total) by mouth every 6 (six) hours  as needed. 15 tablet 0  ? ?No current facility-administered medications for this visit.  ? ? ?Allergies:   Patient has no known allergies.  ? ? ?ROS:  Please see the history of present illness.   Otherwise, review of systems are positive for none.   All other systems are reviewed and negative.  ? ? ?PHYSICAL EXAM: ?VS:  BP 138/70   Pulse (!) 58   Ht '5\' 8"'$  (1.727 m)   Wt 172 lb (78 kg)   SpO2 100%   BMI 26.15 kg/m?  , BMI Body mass index is 26.15 kg/m?. ?GENERAL:  Well appearing ?NECK:  Positive jugular venous distention, waveform within normal limits, carotid upstroke brisk and symmetric, no bruits, no thyromegaly ?LUNGS:  Clear to auscultation bilaterally ?CHEST:  Unremarkable ?HEART:  PMI not displaced or sustained,S1 and S2 within normal limits, no S3, no clicks, no rubs, 3 out of 6 holosystolic murmur heard at the apex and radiating to to the axilla, no diastolic murmurs, irregular  ?ABD:  Flat, positive bowel sounds normal in frequency in pitch, no bruits, no rebound, no guarding, no midline pulsatile mass, no hepatomegaly, no splenomegaly ?EXT:  2 plus pulses throughout, no edema, no cyanosis no clubbing ? ? ?EKG:  EKG is not ordered today. ?NA ? ? ?Recent Labs: ?10/27/2021: ALT 21; Hemoglobin 12.4; Platelets 139 ?11/05/2021: BUN 19; Creatinine, Ser 1.50; Potassium 4.8; Sodium 141  ? ? ? ?Wt Readings from Last 3 Encounters:  ?12/03/21 172 lb (78 kg)  ?11/16/21 168  lb (76.2 kg)  ?11/05/21 175 lb 3.2 oz (79.5 kg)  ?  ? ? ?Other studies Reviewed: ?Additional studies/ records that were reviewed today include: Echo ?Review of the above records demonstrates:  Please see elsewh

## 2021-12-03 ENCOUNTER — Ambulatory Visit: Payer: Medicare HMO | Admitting: Cardiology

## 2021-12-03 ENCOUNTER — Encounter: Payer: Self-pay | Admitting: Cardiology

## 2021-12-03 ENCOUNTER — Ambulatory Visit
Admission: RE | Admit: 2021-12-03 | Discharge: 2021-12-03 | Disposition: A | Discharge status: Home / Self Care | Payer: Medicare HMO | Source: Ambulatory Visit | Attending: Cardiology | Admitting: Cardiology

## 2021-12-03 VITALS — BP 138/70 | HR 58 | Ht 68.0 in | Wt 172.0 lb

## 2021-12-03 DIAGNOSIS — I10 Essential (primary) hypertension: Secondary | ICD-10-CM | POA: Diagnosis not present

## 2021-12-03 DIAGNOSIS — I2510 Atherosclerotic heart disease of native coronary artery without angina pectoris: Secondary | ICD-10-CM

## 2021-12-03 DIAGNOSIS — I4819 Other persistent atrial fibrillation: Secondary | ICD-10-CM

## 2021-12-03 DIAGNOSIS — E785 Hyperlipidemia, unspecified: Secondary | ICD-10-CM

## 2021-12-03 DIAGNOSIS — R079 Chest pain, unspecified: Secondary | ICD-10-CM | POA: Diagnosis not present

## 2021-12-03 DIAGNOSIS — J90 Pleural effusion, not elsewhere classified: Secondary | ICD-10-CM | POA: Diagnosis not present

## 2021-12-03 DIAGNOSIS — J811 Chronic pulmonary edema: Secondary | ICD-10-CM | POA: Diagnosis not present

## 2021-12-03 DIAGNOSIS — R0602 Shortness of breath: Secondary | ICD-10-CM | POA: Diagnosis not present

## 2021-12-03 MED ORDER — FUROSEMIDE 20 MG PO TABS
ORAL_TABLET | ORAL | 0 refills | Status: DC
Start: 2021-12-03 — End: 2021-12-22

## 2021-12-03 NOTE — Patient Instructions (Addendum)
Medication Instructions:  ?Take Lasix 20 mg daily for 3 days only ?Continue all other medications ?*If you need a refill on your cardiac medications before your next appointment, please call your pharmacy* ? ? ?Lab Work: ?Bmet,bnp and cbc in 5 days ? ? ?Testing/Procedures: ?Chest xray today at [place] [facility] ? ?TEE  will be scheduled with Structural Heart Dr.   Follow instructions below  ? ? ?Follow-Up: ?At [facility], you and your health needs are our priority.  As part of our continuing mission to provide you with exceptional heart care, we have created designated Provider Care Teams.  These Care Teams include your primary Cardiologist (physician) and Advanced Practice Providers (APPs -  Physician Assistants and Nurse Practitioners) who all work together to provide you with the care you need, when you need it. ? ?We recommend signing up for the patient portal called "MyChart".  Sign up information is provided on this After Visit Summary.  MyChart is used to connect with patients for Virtual Visits (Telemedicine).  Patients are able to view lab/test results, encounter notes, upcoming appointments, etc.  Non-urgent messages can be sent to your provider as well.   ?To learn more about what you can do with MyChart, go to [email or web address].   ? ?Your next appointment:  After TEE ?  ? ?The format for your next appointment: Office ? ? ?Provider:  Dr.Hochrein ? ? ? ? ?You are scheduled for a TEE on Friday 12/11/21  with Dr.Acharya. Please arrive at the [facility] (Main Entrance A) at [facility]: [address] [place], [place] [address] at 8:00 am. ? ?DIET: Nothing to eat or drink after midnight except a sip of water with medications (see medication instructions below) ? ?FYI: For your safety, and to allow [place] to monitor your vital signs accurately during the surgery/procedure we request that   ?if you have artificial nails, gel coating, SNS etc. Please have those removed prior to  your surgery/procedure. Not having the nail coverings /polish removed may result in cancellation or delay of your surgery/procedure. ? ? ?Medication Instructions: ? ? ? ?Labs: Bmet,bnp,cbc ? ? ? ?You must have a responsible person to drive you home and stay in the waiting area during your procedure. Failure to do so could result in cancellation. ? ?Interior and spatial designer cards. ? ?*Special Note: Every effort is made to have your procedure done on time. Occasionally there are emergencies that occur at the hospital that may cause delays. Please be patient if a delay does occur.  ? ?Important Information About Sugar ? ? ? ? ? ? ?

## 2021-12-03 NOTE — Addendum Note (Signed)
Addended by: Kathyrn Lass on: 12/03/2021 04:05 PM ? ? Modules accepted: Orders ? ?

## 2021-12-04 ENCOUNTER — Encounter (HOSPITAL_COMMUNITY): Payer: Self-pay | Admitting: Internal Medicine

## 2021-12-04 ENCOUNTER — Other Ambulatory Visit: Payer: Self-pay | Admitting: *Deleted

## 2021-12-04 DIAGNOSIS — R011 Cardiac murmur, unspecified: Secondary | ICD-10-CM

## 2021-12-04 DIAGNOSIS — I340 Nonrheumatic mitral (valve) insufficiency: Secondary | ICD-10-CM

## 2021-12-04 DIAGNOSIS — I4819 Other persistent atrial fibrillation: Secondary | ICD-10-CM

## 2021-12-04 NOTE — Progress Notes (Signed)
Ordered placed  for TEE ?

## 2021-12-04 NOTE — Progress Notes (Signed)
Attempted to obtain medical history via telephone, unable to reach at this time. Unable to leave a voicemail to return pre surgical testing department's phone call.  ?

## 2021-12-07 ENCOUNTER — Encounter: Payer: Self-pay | Admitting: *Deleted

## 2021-12-09 DIAGNOSIS — I10 Essential (primary) hypertension: Secondary | ICD-10-CM | POA: Diagnosis not present

## 2021-12-09 DIAGNOSIS — I4819 Other persistent atrial fibrillation: Secondary | ICD-10-CM | POA: Diagnosis not present

## 2021-12-09 DIAGNOSIS — E785 Hyperlipidemia, unspecified: Secondary | ICD-10-CM | POA: Diagnosis not present

## 2021-12-09 DIAGNOSIS — R0602 Shortness of breath: Secondary | ICD-10-CM | POA: Diagnosis not present

## 2021-12-09 DIAGNOSIS — I2510 Atherosclerotic heart disease of native coronary artery without angina pectoris: Secondary | ICD-10-CM | POA: Diagnosis not present

## 2021-12-09 DIAGNOSIS — I350 Nonrheumatic aortic (valve) stenosis: Secondary | ICD-10-CM | POA: Diagnosis not present

## 2021-12-10 LAB — BASIC METABOLIC PANEL
BUN/Creatinine Ratio: 12 (ref 10–24)
BUN: 16 mg/dL (ref 8–27)
CO2: 26 mmol/L (ref 20–29)
Calcium: 9.3 mg/dL (ref 8.6–10.2)
Chloride: 103 mmol/L (ref 96–106)
Creatinine, Ser: 1.29 mg/dL — ABNORMAL HIGH (ref 0.76–1.27)
Glucose: 104 mg/dL — ABNORMAL HIGH (ref 70–99)
Potassium: 4.5 mmol/L (ref 3.5–5.2)
Sodium: 139 mmol/L (ref 134–144)
eGFR: 55 mL/min/{1.73_m2} — ABNORMAL LOW (ref >59)

## 2021-12-10 LAB — CBC WITH DIFFERENTIAL/PLATELET
Basophils Absolute: 0 10*3/uL (ref 0.0–0.2)
Basos: 1 %
EOS (ABSOLUTE): 0.1 10*3/uL (ref 0.0–0.4)
Eos: 2 %
Hematocrit: 36.3 % — ABNORMAL LOW (ref 37.5–51.0)
Hemoglobin: 11.8 g/dL — ABNORMAL LOW (ref 13.0–17.7)
Immature Grans (Abs): 0 10*3/uL (ref 0.0–0.1)
Immature Granulocytes: 0 %
Lymphocytes Absolute: 1.3 10*3/uL (ref 0.7–3.1)
Lymphs: 24 %
MCH: 29.6 pg (ref 26.6–33.0)
MCHC: 32.5 g/dL (ref 31.5–35.7)
MCV: 91 fL (ref 79–97)
Monocytes Absolute: 0.5 10*3/uL (ref 0.1–0.9)
Monocytes: 9 %
Neutrophils Absolute: 3.5 10*3/uL (ref 1.4–7.0)
Neutrophils: 64 %
Platelets: 141 10*3/uL — ABNORMAL LOW (ref 150–450)
RBC: 3.99 x10E6/uL — ABNORMAL LOW (ref 4.14–5.80)
RDW: 13.8 % (ref 11.6–15.4)
WBC: 5.5 10*3/uL (ref 3.4–10.8)

## 2021-12-10 LAB — BRAIN NATRIURETIC PEPTIDE: BNP: 615.9 pg/mL — ABNORMAL HIGH (ref 0.0–100.0)

## 2021-12-11 ENCOUNTER — Ambulatory Visit (HOSPITAL_BASED_OUTPATIENT_CLINIC_OR_DEPARTMENT_OTHER)
Admission: RE | Admit: 2021-12-11 | Discharge: 2021-12-11 | Disposition: A | Discharge status: Home / Self Care | Payer: Medicare HMO | Source: Ambulatory Visit | Attending: Cardiology | Admitting: Cardiology

## 2021-12-11 ENCOUNTER — Ambulatory Visit (HOSPITAL_BASED_OUTPATIENT_CLINIC_OR_DEPARTMENT_OTHER): Payer: Medicare HMO | Admitting: Anesthesiology

## 2021-12-11 ENCOUNTER — Encounter: Payer: Self-pay | Admitting: *Deleted

## 2021-12-11 ENCOUNTER — Other Ambulatory Visit: Payer: Self-pay

## 2021-12-11 ENCOUNTER — Encounter (HOSPITAL_COMMUNITY)
Admission: RE | Disposition: A | Discharge status: Home / Self Care | Payer: Self-pay | Source: Home / Self Care | Attending: Internal Medicine

## 2021-12-11 ENCOUNTER — Ambulatory Visit (HOSPITAL_COMMUNITY): Payer: Medicare HMO | Admitting: Anesthesiology

## 2021-12-11 ENCOUNTER — Encounter (HOSPITAL_COMMUNITY): Payer: Self-pay | Admitting: Internal Medicine

## 2021-12-11 ENCOUNTER — Ambulatory Visit (HOSPITAL_COMMUNITY)
Admission: RE | Admit: 2021-12-11 | Discharge: 2021-12-11 | Disposition: A | Discharge status: Home / Self Care | Payer: Medicare HMO | Attending: Internal Medicine | Admitting: Internal Medicine

## 2021-12-11 DIAGNOSIS — I340 Nonrheumatic mitral (valve) insufficiency: Secondary | ICD-10-CM | POA: Diagnosis not present

## 2021-12-11 DIAGNOSIS — I088 Other rheumatic multiple valve diseases: Secondary | ICD-10-CM

## 2021-12-11 DIAGNOSIS — I2510 Atherosclerotic heart disease of native coronary artery without angina pectoris: Secondary | ICD-10-CM

## 2021-12-11 DIAGNOSIS — E785 Hyperlipidemia, unspecified: Secondary | ICD-10-CM | POA: Diagnosis not present

## 2021-12-11 DIAGNOSIS — I252 Old myocardial infarction: Secondary | ICD-10-CM | POA: Diagnosis not present

## 2021-12-11 DIAGNOSIS — Z7901 Long term (current) use of anticoagulants: Secondary | ICD-10-CM | POA: Diagnosis not present

## 2021-12-11 DIAGNOSIS — I10 Essential (primary) hypertension: Secondary | ICD-10-CM | POA: Insufficient documentation

## 2021-12-11 DIAGNOSIS — I4819 Other persistent atrial fibrillation: Secondary | ICD-10-CM | POA: Insufficient documentation

## 2021-12-11 DIAGNOSIS — I4891 Unspecified atrial fibrillation: Secondary | ICD-10-CM | POA: Diagnosis not present

## 2021-12-11 DIAGNOSIS — I341 Nonrheumatic mitral (valve) prolapse: Secondary | ICD-10-CM | POA: Insufficient documentation

## 2021-12-11 DIAGNOSIS — I3139 Other pericardial effusion (noninflammatory): Secondary | ICD-10-CM | POA: Diagnosis not present

## 2021-12-11 DIAGNOSIS — R011 Cardiac murmur, unspecified: Secondary | ICD-10-CM

## 2021-12-11 HISTORY — PX: TEE WITHOUT CARDIOVERSION: SHX5443

## 2021-12-11 HISTORY — PX: BUBBLE STUDY: SHX6837

## 2021-12-11 SURGERY — ECHOCARDIOGRAM, TRANSESOPHAGEAL
Anesthesia: Monitor Anesthesia Care

## 2021-12-11 MED ORDER — BUTAMBEN-TETRACAINE-BENZOCAINE 2-2-14 % EX AERO
INHALATION_SPRAY | CUTANEOUS | Status: DC | PRN
  Administered 2021-12-11: 2 via TOPICAL

## 2021-12-11 MED ORDER — PROPOFOL 10 MG/ML IV BOLUS
INTRAVENOUS | Status: DC | PRN
  Administered 2021-12-11 (×2): 20 mg via INTRAVENOUS

## 2021-12-11 MED ORDER — PROPOFOL 500 MG/50ML IV EMUL
INTRAVENOUS | Status: DC | PRN
  Administered 2021-12-11: 100 ug/kg/min via INTRAVENOUS

## 2021-12-11 MED ORDER — SODIUM CHLORIDE 0.9 % IV SOLN: INTRAVENOUS | Status: DC

## 2021-12-11 MED ORDER — LIDOCAINE 2% (20 MG/ML) 5 ML SYRINGE
INTRAMUSCULAR | Status: DC | PRN
  Administered 2021-12-11: 100 mg via INTRAVENOUS

## 2021-12-11 MED ORDER — SODIUM CHLORIDE 0.9 % IV SOLN
INTRAVENOUS | Status: AC | PRN
  Administered 2021-12-11: 500 mL via INTRAVENOUS

## 2021-12-11 MED ORDER — EPHEDRINE SULFATE-NACL 50-0.9 MG/10ML-% IV SOSY
PREFILLED_SYRINGE | INTRAVENOUS | Status: DC | PRN
  Administered 2021-12-11: 5 mg via INTRAVENOUS

## 2021-12-11 NOTE — Interval H&P Note (Signed)
History and Physical Interval Note: ? ?12/11/2021 ?9:24 AM ? ?[name]  has presented today for surgery, with the diagnosis of severe MR.  The various methods of treatment have been discussed with the patient and family. After consideration of risks, benefits and other options for treatment, the patient has consented to  Procedure(s): ?TRANSESOPHAGEAL ECHOCARDIOGRAM (TEE) (N/A) as a surgical intervention.  The patient's history has been reviewed, patient examined, no change in status, stable for surgery.  I have reviewed the patient's chart and labs.  Questions were answered to the patient's satisfaction.   ? ? ?[name] ? ? ?

## 2021-12-11 NOTE — Anesthesia Postprocedure Evaluation (Signed)
Anesthesia Post Note ? ?Patient: [name] ? ?Procedure(s) Performed: TRANSESOPHAGEAL ECHOCARDIOGRAM (TEE) ?BUBBLE STUDY ? ?  ? ?Patient location during evaluation: Endoscopy ?Anesthesia Type: MAC ?Level of consciousness: awake and alert ?Pain management: pain level controlled ?Vital Signs Assessment: post-procedure vital signs reviewed and stable ?Respiratory status: spontaneous breathing, nonlabored ventilation, respiratory function stable and patient connected to nasal cannula oxygen ?Cardiovascular status: stable and blood pressure returned to baseline ?Postop Assessment: no apparent nausea or vomiting ?Anesthetic complications: no ? ? ?No notable events documented. ? ?Last Vitals:  ?Vitals:  ? 12/11/21 1042 12/11/21 1057  ?BP: 122/63 (!) 111/49  ?Pulse: 69 (!) 56  ?Resp: (!) 22 19  ?Temp:    ?SpO2: 94% 94%  ?  ?Last Pain:  ?Vitals:  ? 12/11/21 0816  ?TempSrc: Temporal  ?PainSc: 0-No pain  ? ? ?  ?  ?  ?  ?  ?  ? ?Santa Lighter ? ? ? ? ?

## 2021-12-11 NOTE — Anesthesia Preprocedure Evaluation (Signed)
Anesthesia Evaluation  ?Patient identified by MRN, date of birth, ID band ?Patient awake ? ? ? ?Reviewed: ?Allergy & Precautions, NPO status , Patient's Chart, lab work & pertinent test results, reviewed documented beta blocker date and time  ? ?History of Anesthesia Complications ?Negative for: history of anesthetic complications ? ?Airway ?Mallampati: II ? ?TM Distance: >3 FB ?Neck ROM: Full ? ? ? Dental ? ?(+) Teeth Intact, Dental Advisory Given, Caps ?  ?Pulmonary ?former smoker,  ?  ?Pulmonary exam normal ?breath sounds clear to auscultation ? ? ? ? ? ? Cardiovascular ?hypertension, Pt. on medications ?(-) angina+ CAD, + Past MI and + Cardiac Stents  ?+ dysrhythmias Atrial Fibrillation + Valvular Problems/Murmurs MR  ?Rhythm:Irregular Rate:Abnormal ?+ Systolic murmurs ? ?  ?Neuro/Psych ?negative neurological ROS ?   ? GI/Hepatic ?Neg liver ROS, GERD  Medicated and Controlled,  ?Endo/Other  ?negative endocrine ROS ? Renal/GU ?negative Renal ROS  ? ?  ?Musculoskeletal ? ?(+) Arthritis ,  ? Abdominal ?  ?Peds ? Hematology ? ?(+) Blood dyscrasia (Eliquis), ,   ?Anesthesia Other Findings ?Day of surgery medications reviewed with the patient. ? Reproductive/Obstetrics ? ?  ? ? ? ? ? ? ? ? ? ? ? ? ? ?  ?  ? ? ? ? ? ? ? ? ?Anesthesia Physical ?Anesthesia Plan ? ?ASA: 3 ? ?Anesthesia Plan: MAC  ? ?Post-op Pain Management: Minimal or no pain anticipated  ? ?Induction: Intravenous ? ?PONV Risk Score and Plan: 1 and TIVA and Treatment may vary due to age or medical condition ? ?Airway Management Planned: Natural Airway and Nasal Cannula ? ?Additional Equipment:  ? ?Intra-op Plan:  ? ?Post-operative Plan:  ? ?Informed Consent: I have reviewed the patients History and Physical, chart, labs and discussed the procedure including the risks, benefits and alternatives for the proposed anesthesia with the patient or authorized representative who has indicated his/her understanding and acceptance.   ? ? ? ?Dental advisory given ? ?Plan Discussed with: CRNA ? ?Anesthesia Plan Comments:   ? ? ? ? ? ? ?Anesthesia Quick Evaluation ? ?

## 2021-12-11 NOTE — CV Procedure (Signed)
INDICATIONS: ?Severe MR ? ?PROCEDURE:  ? ?Informed consent was obtained prior to the procedure. The risks, benefits and alternatives for the procedure were discussed and the patient comprehended these risks.  Risks include, but are not limited to, cough, sore throat, vomiting, nausea, somnolence, esophageal and stomach trauma or perforation, bleeding, low blood pressure, aspiration, pneumonia, infection, trauma to the teeth and death.   ? ?After a procedural time-out, the oropharynx was anesthetized with 20% benzocaine spray.  ? ?During this procedure the patient was administered propofol per anesthesia.  The patient's heart rate, blood pressure, and oxygen saturation were monitored continuously during the procedure. The period of conscious sedation was 40 minutes, of which I was present face-to-face 100% of this time. ? ?The transesophageal probe was inserted in the esophagus and stomach without difficulty and multiple views were obtained.  The patient was kept under observation until the patient left the procedure room.  The patient left the procedure room in stable condition.  ? ?Agitated microbubble saline contrast was administered. ? ?COMPLICATIONS:   ? ?There were no immediate complications. ? ?FINDINGS:  ? ?FORMAL ECHOCARDIOGRAM REPORT PENDING ?Mild bileaflet mitral valve prolapse with severe, commissural MR. Main central jet with at least 2 other commissural jets in total appears severe.  ?Normal biventricular chamber size and function.  ?Biatrial dilation.  ?No LA appendage thrombus ?No PFO.  ?Moderately calcified aortic valve with no aortic stenosis, no AI. ?Trivial TR, PR, grossly normal TV and PV. ? ?RECOMMENDATIONS:   ? ? D/c when alert ? ?Time Spent Directly with the Patient: ? ?60 minutes  ? ?Elouise Munroe ?12/11/2021, 10:33 AM ? ?

## 2021-12-11 NOTE — Transfer of Care (Signed)
Immediate Anesthesia Transfer of Care Note ? ?Patient: [name] ? ?Procedure(s) Performed: TRANSESOPHAGEAL ECHOCARDIOGRAM (TEE) ?BUBBLE STUDY ? ?Patient Location: PACU ? ?Anesthesia Type:General ? ?Level of Consciousness: drowsy ? ?Airway & Oxygen Therapy: Patient Spontanous Breathing ? ?Post-op Assessment: Report given to RN and Post -op Vital signs reviewed and stable ? ?Post vital signs: Reviewed and stable ? ?Last Vitals:  ?Vitals Value Taken Time  ?BP 98/59 12/11/21 1030  ?Temp    ?Pulse 66 12/11/21 1031  ?Resp 18 12/11/21 1031  ?SpO2 94 % 12/11/21 1031  ?Vitals shown include unvalidated device data. ? ?Last Pain:  ?Vitals:  ? 12/11/21 0816  ?TempSrc: Temporal  ?PainSc: 0-No pain  ?   ? ?  ? ?Complications: No notable events documented. ?

## 2021-12-11 NOTE — Anesthesia Procedure Notes (Signed)
Procedure Name: General with mask airway ?Date/Time: 12/11/2021 9:39 AM ?Performed by: Erick Colace, CRNA ?Pre-anesthesia Checklist: Patient identified, Emergency Drugs available, Patient being monitored and Suction available ?Patient Re-evaluated:Patient Re-evaluated prior to induction ?Oxygen Delivery Method: Nasal cannula ?Preoxygenation: Pre-oxygenation with 100% oxygen ?Induction Type: IV induction ?Airway Equipment and Method: Bite block ?Dental Injury: Teeth and Oropharynx as per pre-operative assessment  ? ? ? ? ?

## 2021-12-11 NOTE — Discharge Instructions (Signed)

## 2021-12-12 LAB — ECHO TEE
AV Mean grad: 5 mmHg
MV M vel: 5.08 m/s
MV Peak grad: 103 mmHg
MV VTI: 1.72 cm2
Radius: 0.8 cm

## 2021-12-14 ENCOUNTER — Encounter (HOSPITAL_COMMUNITY): Payer: Self-pay | Admitting: Internal Medicine

## 2021-12-14 ENCOUNTER — Telehealth: Payer: Self-pay

## 2021-12-14 NOTE — Telephone Encounter (Signed)
Per [employer] review of 12/11/2021 TEE: ?"For patient MA: This is atrial functional MR and does not require a surgical consult ? ?This looks like a clippable valve. The fossa looks approachable for transseptal puncture in the SAXB and Bicaval views. TR is noted. LA dimensions are large enough for device steering and straddle. The MR jet is centrally located and caused by atrial dilation/coaptation gap. The posterior leaflet measures 1.64cm in the LVOT grasping view. MVA measures 6.93cm2 & gradient is 1 at a HR of 75bpm. I'd like to verify both MVA and gradient in the case prior to the start. Based on the nature of the MR, I would plan to use 2 NTW clips starting just lateral of A2/P2 and assessing for residual & gradient." ? ? ? ?

## 2021-12-18 DIAGNOSIS — E782 Mixed hyperlipidemia: Secondary | ICD-10-CM | POA: Diagnosis not present

## 2021-12-18 DIAGNOSIS — E039 Hypothyroidism, unspecified: Secondary | ICD-10-CM | POA: Diagnosis not present

## 2021-12-18 DIAGNOSIS — N183 Chronic kidney disease, stage 3 unspecified: Secondary | ICD-10-CM | POA: Diagnosis not present

## 2021-12-18 DIAGNOSIS — I10 Essential (primary) hypertension: Secondary | ICD-10-CM | POA: Diagnosis not present

## 2021-12-18 DIAGNOSIS — I340 Nonrheumatic mitral (valve) insufficiency: Secondary | ICD-10-CM | POA: Diagnosis not present

## 2021-12-18 DIAGNOSIS — K219 Gastro-esophageal reflux disease without esophagitis: Secondary | ICD-10-CM | POA: Diagnosis not present

## 2021-12-18 DIAGNOSIS — I482 Chronic atrial fibrillation, unspecified: Secondary | ICD-10-CM | POA: Diagnosis not present

## 2021-12-18 DIAGNOSIS — I2510 Atherosclerotic heart disease of native coronary artery without angina pectoris: Secondary | ICD-10-CM | POA: Diagnosis not present

## 2021-12-18 DIAGNOSIS — D509 Iron deficiency anemia, unspecified: Secondary | ICD-10-CM | POA: Diagnosis not present

## 2021-12-18 DIAGNOSIS — R7303 Prediabetes: Secondary | ICD-10-CM | POA: Diagnosis not present

## 2021-12-20 DIAGNOSIS — I482 Chronic atrial fibrillation, unspecified: Secondary | ICD-10-CM | POA: Insufficient documentation

## 2021-12-20 NOTE — Progress Notes (Unsigned)
Cardiology Office Note   Date:  12/22/2021   ID:  [name], DOB [birth year], MRN [id number]  PCP:  Shirline Frees, MD  Cardiologist:   Minus Breeding, MD Referring:  Shirline Frees, MD  Chief Complaint  Patient presents with   Shortness of Breath      History of Present Illness: [name] is a 86 y.o. male who presents for preop evaluation.  He has a cardiac history with previous MI in 2001 with 90% circ lesion treated with PTCA.  LAD had 43 - 80% stenosis He was last cathed in 2003 with non obstructive disease.  He had a stress perfusion study prior to another surgery in 2016 that was normal.   He was diagnosed recently with new onset atrial fib.  He had severe MR on his echo.  His EF was 55 to 60%.  He did have severely dilated left atrium.  There was some aortic sclerosis.  I treated him with diuretic and discussed his case with my structural partners.    The patient is deemed to be a candidate for MitraClip.  We talked about this today.  He continues to have dyspnea with exertion.  He did take 3 days worth of Lasix and was not sure if this helped.  He has renal insufficiency so I been cautious about this.  He is describing some shortness of breath when he lies down at night.  He is not describing lower extremity swelling.  He is watching his salt.  He has not been having any new chest pressure, neck or arm discomfort.   Past Medical History:  Diagnosis Date   Arthritis    knees,back   CAD (coronary artery disease) CARDIOLOGIST-  DR Marcheta Horsey   previous myocardial infarction in 2001 with 90% circumflex lesion treated with PTCA. The LAD had 70-80% stenosis which was treawted with angioplasty. Most recent catheterization in 2003 demonstrated the LAD at 30% mid stenosis, circumflex 60-70% stenosis in the mid AV grove, and 40% stenosis in the previously stented area. The right coronary artery had 30-40% stenosis in the mid portion.    GERD (gastroesophageal reflux disease)     Heart murmur    History of kidney stones    Hypertension    Left ureteral stone    Myocardial infarction [facility])    Pre-diabetes    Skin cancer    face    Past Surgical History:  Procedure Laterality Date   APPENDECTOMY  as teen   BUBBLE STUDY  12/11/2021   Procedure: BUBBLE STUDY;  Surgeon: Elouise Munroe, MD;  Location: [facility] ENDOSCOPY;  Service: Cardiology;;   CARDIAC CATHETERIZATION  03-29-2000   dr Bohden Dung   Nonobstuctive CAD /   CFX patent stent and LAD diffuse luminal irregularities including mid segment previously been angioplastied   CARDIOVASCULAR STRESS TEST  05-09-2015   dr Travon Crochet   low risk perfusion study/  no reversible ischemia/  normal LV function and wall motion , ef 63%   CATARACT EXTRACTION W/ INTRAOCULAR LENS  IMPLANT, BILATERAL  left 05-06-2015/  right 06-10-2015   CORONARY ANGIOPLASTY WITH STENT PLACEMENT  01-21-2000  dr Cari Vandeberg   PTCA to pLAD/  PTCA and BMS x1 to mid LCFX/  normal LVSF   CORONARY ANGIOPLASTY WITH STENT PLACEMENT  01-10-2002  dr Lyndel Safe   PTCA and BMS to mid AV LCFX (in-stent restenosis)/  diffuse 30-40% mRCA ,  mLAD diffuse 30%,  normal LVSF, ef 65%   CYSTOSCOPY/RETROGRADE/URETEROSCOPY/STONE EXTRACTION WITH BASKET  Left 05/23/2015   Procedure: Albertine Grates OF PROSTATE,  LEFT URETEROSCOPY, JJ STENT PLACEMENT;  Surgeon: Festus Aloe, MD;  Location: [facility];  Service: Urology;  Laterality: Left;   ELECTROPHYSIOLOGIC STUDY  03-30-2000   dr Caryl Comes   EXTRACORPOREAL SHOCK WAVE LITHOTRIPSY Left 10-30-2012   EXTRACORPOREAL SHOCK WAVE LITHOTRIPSY Right 11/16/2021   Procedure: EXTRACORPOREAL SHOCK WAVE LITHOTRIPSY (ESWL);  Surgeon: Franchot Gallo, MD;  Location: [facility];  Service: Urology;  Laterality: Right;   HOLMIUM LASER APPLICATION Left 62/83/1517   Procedure: WITH HOLMIUM LASER LITHOTRIPSY;  Surgeon: Festus Aloe, MD;  Location: [facility];  Service: Urology;  Laterality:  Left;   KNEE ARTHROSCOPY Bilateral x2 right/  left 2015   LUMBAR LAMINECTOMY/DECOMPRESSION MICRODISCECTOMY N/A 09/28/2012   Procedure: L3  -L5 DECOMPRESSION L4 - L5 FUSION WITH INSTRUMENTATION 2 LEVELS;  Surgeon: Melina Schools, MD;  Location: [place];  Service: Orthopedics;  Laterality: N/A;   TEE WITHOUT CARDIOVERSION N/A 12/11/2021   Procedure: TRANSESOPHAGEAL ECHOCARDIOGRAM (TEE);  Surgeon: Elouise Munroe, MD;  Location: [place];  Service: Cardiology;  Laterality: N/A;   TONSILLECTOMY  as child   TOTAL KNEE ARTHROPLASTY Left 10/02/2018   Procedure: TOTAL LEFT  KNEE ARTHROPLASTY;  Surgeon: Paralee Cancel, MD;  Location: [facility];  Service: Orthopedics;  Laterality: Left;  90 mins- Ok per Tiffany   TOTAL SHOULDER ARTHROPLASTY Right 01/14/2011   TRANSTHORACIC ECHOCARDIOGRAM  05-21-2015   mild LVH,  ef 55-605,  grade 2 diastolic dysfunction, basal inferior hyperkinesis/  moderate AV calcification sclerosis without stenosis/  trivial MR/  moderate LAE/  mild TR/  mild RAE     Current Outpatient Medications  Medication Sig Dispense Refill   apixaban (ELIQUIS) 5 MG TABS tablet Take 1 tablet (5 mg total) by mouth 2 (two) times daily. 60 tablet 5   atorvastatin (LIPITOR) 80 MG tablet Take 1 tablet (80 mg total) by mouth at bedtime. 90 tablet 3   Cholecalciferol (VITAMIN D3 PO) Take by mouth daily.     Cyanocobalamin (B-12 PO) Take by mouth daily.     docusate sodium (COLACE) 100 MG capsule Take 1 capsule (100 mg total) by mouth 2 (two) times daily. 10 capsule 0   esomeprazole (NEXIUM) 20 MG capsule Take 20 mg by mouth daily before breakfast.      ferrous sulfate (FERROUSUL) 325 (65 FE) MG tablet Take 1 tablet (325 mg total) by mouth 3 (three) times daily with meals. (Patient taking differently: Take 325 mg by mouth daily with breakfast.)  3   folic acid (FOLVITE) 616 MCG tablet Take 800 mcg by mouth daily.      levothyroxine (SYNTHROID) 25 MCG tablet Take 25 mcg by mouth daily before breakfast.      methocarbamol (ROBAXIN) 500 MG tablet Take 1 tablet (500 mg total) by mouth every 6 (six) hours as needed for muscle spasms. 40 tablet 0   metoprolol tartrate (LOPRESSOR) 25 MG tablet Take 1 tablet (25 mg total) by mouth 2 (two) times daily. 180 tablet 3   niacin 500 MG tablet Take 500 mg by mouth daily.     nitroGLYCERIN (NITROSTAT) 0.4 MG SL tablet Place 0.4 mg under the tongue every 5 (five) minutes as needed for chest pain.     ondansetron (ZOFRAN) 4 MG tablet Take 1 tablet (4 mg total) by mouth every 6 (six) hours. 12 tablet 0   oxyCODONE-acetaminophen (PERCOCET/ROXICET) 5-325 MG tablet Take 1 tablet by mouth every 6 (six) hours as needed  for severe pain.     polyethylene glycol (MIRALAX / GLYCOLAX) packet Take 17 g by mouth 2 (two) times daily. (Patient taking differently: Take 17 g by mouth daily as needed.) 14 each 0   traMADol (ULTRAM) 50 MG tablet Take 1 tablet (50 mg total) by mouth every 6 (six) hours as needed. (Patient taking differently: Take 50 mg by mouth every 6 (six) hours as needed for moderate pain.) 15 tablet 0   furosemide (LASIX) 20 MG tablet Take 1 tablet (20 mg total) by mouth daily. Take 20 mg daily for 3 days only 90 tablet 3   No current facility-administered medications for this visit.    Allergies:   Patient has no known allergies.    ROS:  Please see the history of present illness.   Otherwise, review of systems are positive for none.   All other systems are reviewed and negative.    PHYSICAL EXAM: VS:  BP 136/72 (BP Location: Left Arm, Patient Position: Sitting, Cuff Size: Normal)   Pulse 62   Ht '5\' 8"'$  (1.727 m)   Wt 169 lb (76.7 kg)   BMI 25.70 kg/m  , BMI Body mass index is 25.7 kg/m. GENERAL:  Well appearing NECK:  No jugular venous distention, waveform within normal limits, carotid upstroke brisk and symmetric, no bruits, no thyromegaly LUNGS:  Clear to auscultation bilaterally CHEST:  Unremarkable HEART:  PMI not displaced or sustained,S1 and S2  within normal limits, no S3, no clicks, no rubs, 3 out of 6 holosystolic murmur radiating slightly at the aortic outflow tract, no diastolic murmurs, irregular  ABD:  Flat, positive bowel sounds normal in frequency in pitch, no bruits, no rebound, no guarding, no midline pulsatile mass, no hepatomegaly, no splenomegaly EXT:  2 plus pulses throughout, no edema, no cyanosis no clubbing  EKG:  EKG is not ordered today.    Recent Labs: 10/27/2021: ALT 21 12/09/2021: BNP 615.9; BUN 16; Creatinine, Ser 1.29; Hemoglobin 11.8; Platelets 141; Potassium 4.5; Sodium 139     Wt Readings from Last 3 Encounters:  12/22/21 169 lb (76.7 kg)  12/11/21 171 lb 15.3 oz (78 kg)  12/03/21 172 lb (78 kg)      Other studies Reviewed: Additional studies/ records that were reviewed today include: Labs Review of the above records demonstrates:  Please see elsewhere in the note.     ASSESSMENT AND PLAN:  PERSISTENT ATRIAL FIB: He has atrial fibrillation.   He is going to continue his Eliquis.  He seems to have good rate control.  Eventually he will need cardioversion but I am going to get his valve addressed first.   CAD:   He is not having any active anginal symptoms.  I will defer further work-up to the structural heart heart disease.  I am.  MR: He has severe symptomatic MR. I will refer him to [facility] for consideration of MitraClip.  I am going to go ahead and start him on Lasix 20 mg daily but follow-up is basic metabolic profile next week.   HTN:  The blood pressure is at target.  No change in therapy other than as above.   DYSLIPIDEMIA:   LDL was previously at target.  No change in therapy.  79 with an HDL of 39.  No change in therapy.   Current medicines are reviewed at length with the patient today.  The patient does not have concerns regarding medicines.  The following changes have been made: As above  Labs/ tests ordered today include:  None  Orders Placed This  Encounter  Procedures   Basic Metabolic Panel (BMET)     Disposition:   FU with me in about  3 months.    Signed, Minus Breeding, MD  12/22/2021 4:46 PM    [facility]

## 2021-12-22 ENCOUNTER — Ambulatory Visit: Payer: Medicare HMO | Admitting: Cardiology

## 2021-12-22 ENCOUNTER — Encounter: Payer: Self-pay | Admitting: Cardiology

## 2021-12-22 VITALS — BP 136/72 | HR 62 | Ht 68.0 in | Wt 169.0 lb

## 2021-12-22 DIAGNOSIS — I2510 Atherosclerotic heart disease of native coronary artery without angina pectoris: Secondary | ICD-10-CM

## 2021-12-22 DIAGNOSIS — I340 Nonrheumatic mitral (valve) insufficiency: Secondary | ICD-10-CM | POA: Diagnosis not present

## 2021-12-22 DIAGNOSIS — I10 Essential (primary) hypertension: Secondary | ICD-10-CM

## 2021-12-22 DIAGNOSIS — E785 Hyperlipidemia, unspecified: Secondary | ICD-10-CM | POA: Diagnosis not present

## 2021-12-22 DIAGNOSIS — I482 Chronic atrial fibrillation, unspecified: Secondary | ICD-10-CM

## 2021-12-22 MED ORDER — FUROSEMIDE 20 MG PO TABS: 20.0000 mg | ORAL_TABLET | Freq: Every day | ORAL | 3 refills | Status: DC

## 2021-12-22 NOTE — Patient Instructions (Signed)
Medication Instructions:   TAKE FUROSEMIDE 20 MG ONCE DAILY  *If you need a refill on your cardiac medications before your next appointment, please call your pharmacy*   Lab Work:  Your physician recommends that you return for lab work in: ONE WEEK-DO NOT NEED TO FAST  If you have labs (blood work) drawn today and your tests are completely normal, you will receive your results only by: [place] (if you have MyChart) OR A paper copy in the mail If you have any lab test that is abnormal or we need to change your treatment, we will call you to review the results.   Follow-Up: At [facility], you and your health needs are our priority.  As part of our continuing mission to provide you with exceptional heart care, we have created designated Provider Care Teams.  These Care Teams include your primary Cardiologist (physician) and Advanced Practice Providers (APPs -  Physician Assistants and Nurse Practitioners) who all work together to provide you with the care you need, when you need it.  We recommend signing up for the patient portal called "MyChart".  Sign up information is provided on this After Visit Summary.  MyChart is used to connect with patients for Virtual Visits (Telemedicine).  Patients are able to view lab/test results, encounter notes, upcoming appointments, etc.  Non-urgent messages can be sent to your provider as well.   To learn more about what you can do with MyChart, go to [email or web address].    Your next appointment:   3 month(s)  The format for your next appointment:   In Person  Provider:   Minus Breeding, MD       Important Information About Sugar

## 2022-01-04 DIAGNOSIS — I340 Nonrheumatic mitral (valve) insufficiency: Secondary | ICD-10-CM | POA: Diagnosis not present

## 2022-01-05 LAB — BASIC METABOLIC PANEL
BUN/Creatinine Ratio: 13 (ref 10–24)
BUN: 17 mg/dL (ref 8–27)
CO2: 27 mmol/L (ref 20–29)
Calcium: 9.5 mg/dL (ref 8.6–10.2)
Chloride: 103 mmol/L (ref 96–106)
Creatinine, Ser: 1.34 mg/dL — ABNORMAL HIGH (ref 0.76–1.27)
Glucose: 99 mg/dL (ref 70–99)
Potassium: 4.3 mmol/L (ref 3.5–5.2)
Sodium: 140 mmol/L (ref 134–144)
eGFR: 53 mL/min/{1.73_m2} — ABNORMAL LOW (ref >59)

## 2022-01-06 ENCOUNTER — Encounter: Payer: Self-pay | Admitting: *Deleted

## 2022-01-18 ENCOUNTER — Ambulatory Visit: Payer: Medicare HMO | Admitting: Internal Medicine

## 2022-01-18 ENCOUNTER — Encounter: Payer: Self-pay | Admitting: Internal Medicine

## 2022-01-18 VITALS — BP 118/60 | HR 78 | Ht 68.0 in | Wt 166.0 lb

## 2022-01-18 DIAGNOSIS — I482 Chronic atrial fibrillation, unspecified: Secondary | ICD-10-CM | POA: Diagnosis not present

## 2022-01-18 DIAGNOSIS — I340 Nonrheumatic mitral (valve) insufficiency: Secondary | ICD-10-CM | POA: Diagnosis not present

## 2022-01-18 DIAGNOSIS — Z01812 Encounter for preprocedural laboratory examination: Secondary | ICD-10-CM

## 2022-01-18 LAB — BASIC METABOLIC PANEL
BUN/Creatinine Ratio: 12 (ref 10–24)
BUN: 17 mg/dL (ref 8–27)
CO2: 24 mmol/L (ref 20–29)
Calcium: 9.7 mg/dL (ref 8.6–10.2)
Chloride: 103 mmol/L (ref 96–106)
Creatinine, Ser: 1.42 mg/dL — ABNORMAL HIGH (ref 0.76–1.27)
Glucose: 95 mg/dL (ref 70–99)
Potassium: 4 mmol/L (ref 3.5–5.2)
Sodium: 139 mmol/L (ref 134–144)
eGFR: 49 mL/min/{1.73_m2} — ABNORMAL LOW (ref >59)

## 2022-01-18 LAB — CBC
Hematocrit: 37.8 % (ref 37.5–51.0)
Hemoglobin: 12.7 g/dL — ABNORMAL LOW (ref 13.0–17.7)
MCH: 30.4 pg (ref 26.6–33.0)
MCHC: 33.6 g/dL (ref 31.5–35.7)
MCV: 90 fL (ref 79–97)
Platelets: 147 10*3/uL — ABNORMAL LOW (ref 150–450)
RBC: 4.18 x10E6/uL (ref 4.14–5.80)
RDW: 14.2 % (ref 11.6–15.4)
WBC: 7.2 10*3/uL (ref 3.4–10.8)

## 2022-01-18 NOTE — H&P (View-Only) (Signed)
Patient ID: [name] MRN: [id number] DOB/AGE: [birth year] 84 y.o.  Primary Care Physician:Harris, Gwyndolyn Saxon, MD Primary Cardiologist: Sharlette Dense CARDIOVASCULAR PROBLEM LIST:   1.  Coronary artery disease status post myocardial infarction in 2001 with balloon angioplasty of circumflex and LAD; most recent cardiac catheterization in 2003 resulted in bare-metal stenting of the left circumflex due to restenosis 2.  Hypertension 3.  Hyperlipidemia 4.  Persistent atrial fibrillation on apixaban 5.  Severe mitral regurgitation (atrial MR)   HISTORY OF PRESENT ILLNESS: The patient is a 86 y.o. male with the indicated medical history here for recommendations regarding severe mitral regurgitation.  The patient has been cared for by Dr. Percival Spanish for several years.  He was seen by Dr. Percival Spanish in early May and at that time reported relatively new onset dyspnea.  The patient tells me that he has developed exertional dyspnea over the last several months.  He tells me that this is affected his days of daily living.  He cannot golf without resting.  He has problems with shortness of breath just brushing his teeth or taking a shower.  He has trouble going to the mailbox.  He fortunately has not been short of breath at rest.  He denies any palpitations, paroxysmal nocturnal dyspnea, orthopnea, or peripheral edema.  He denies any exertional angina.  He has had no severe bleeding or bruising episodes.  He denies any signs or symptoms of stroke.  He sees a Pharmacist, community on a regular basis and had a cleaning recently.  He has no teeth that need to be extracted and reports good dental health.   Family History  Problem Relation Age of Onset   Heart failure Mother    Prostate cancer Father     Social History   Socioeconomic History   Marital status: Married    Spouse name: Not on file   Number of children: 2   Years of education: Not on file   Highest education level: Not on file   Occupational History   Occupation: retired  Tobacco Use   Smoking status: Former    Packs/day: 1.00    Years: 3.00    Total pack years: 3.00    Types: Cigarettes    Quit date: 09/26/1962    Years since quitting: 59.3   Smokeless tobacco: Never  Vaping Use   Vaping Use: Never used  Substance and Sexual Activity   Alcohol use: No   Drug use: No   Sexual activity: Not Currently  Other Topics Concern   Not on file  Social History Narrative   Lives at home with wife.  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     Social Determinants of Health   Financial Resource Strain: Not on file  Food Insecurity: Not on file  Transportation Needs: Not on file  Physical Activity: Not on file  Stress: Not on file  Social Connections: Not on file  Intimate Partner Violence: Not on file     Prior to Admission medications   Medication Sig Start Date End Date Taking? Authorizing Provider  apixaban (ELIQUIS) 5 MG TABS tablet Take 1 tablet (5 mg total) by mouth 2 (two) times daily. 11/05/21   Burnell Blanks, MD  atorvastatin (LIPITOR) 80 MG tablet Take 1 tablet (80 mg total) by mouth at bedtime. 11/26/13   Minus Breeding, MD  Cholecalciferol (VITAMIN D3 PO) Take by mouth daily.    [provider]  Cyanocobalamin (B-12 PO) Take by mouth daily.  [provider]  docusate sodium (COLACE) 100 MG capsule Take 1 capsule (100 mg total) by mouth 2 (two) times daily. 10/02/18   Danae Orleans, PA-C  esomeprazole (NEXIUM) 20 MG capsule Take 20 mg by mouth daily before breakfast.     [provider]  ferrous sulfate (FERROUSUL) 325 (65 FE) MG tablet Take 1 tablet (325 mg total) by mouth 3 (three) times daily with meals. Patient taking differently: Take 325 mg by mouth daily with breakfast. 10/02/18   Danae Orleans, PA-C  folic acid (FOLVITE) 539 MCG tablet Take 800 mcg by mouth daily.     [provider]  furosemide (LASIX) 20 MG tablet Take 1 tablet (20 mg total) by mouth daily. Take  20 mg daily for 3 days only 12/22/21   Minus Breeding, MD  levothyroxine (SYNTHROID) 25 MCG tablet Take 25 mcg by mouth daily before breakfast.    [provider]  methocarbamol (ROBAXIN) 500 MG tablet Take 1 tablet (500 mg total) by mouth every 6 (six) hours as needed for muscle spasms. 10/02/18   Danae Orleans, PA-C  metoprolol tartrate (LOPRESSOR) 25 MG tablet Take 1 tablet (25 mg total) by mouth 2 (two) times daily. 11/26/13   Minus Breeding, MD  niacin 500 MG tablet Take 500 mg by mouth daily.    [provider]  nitroGLYCERIN (NITROSTAT) 0.4 MG SL tablet Place 0.4 mg under the tongue every 5 (five) minutes as needed for chest pain.    [provider]  ondansetron (ZOFRAN) 4 MG tablet Take 1 tablet (4 mg total) by mouth every 6 (six) hours. 10/27/21   Sherrell Puller, PA-C  oxyCODONE-acetaminophen (PERCOCET/ROXICET) 5-325 MG tablet Take 1 tablet by mouth every 6 (six) hours as needed for severe pain.    [provider]  polyethylene glycol (MIRALAX / GLYCOLAX) packet Take 17 g by mouth 2 (two) times daily. Patient taking differently: Take 17 g by mouth daily as needed. 10/02/18   Danae Orleans, PA-C  traMADol (ULTRAM) 50 MG tablet Take 1 tablet (50 mg total) by mouth every 6 (six) hours as needed. Patient taking differently: Take 50 mg by mouth every 6 (six) hours as needed for moderate pain. 10/27/21   Sherrell Puller, PA-C    No Known Allergies  REVIEW OF SYSTEMS:  General: no fevers/chills/night sweats Eyes: no blurry vision, diplopia, or amaurosis ENT: no sore throat or hearing loss Resp: no cough, wheezing, or hemoptysis CV: no edema or palpitations GI: no abdominal pain, nausea, vomiting, diarrhea, or constipation GU: no dysuria, frequency, or hematuria Skin: no rash Neuro: no headache, numbness, tingling, or weakness of extremities Musculoskeletal: no joint pain or swelling Heme: no bleeding, DVT, or easy bruising Endo: no polydipsia or  polyuria  BP 118/60   Pulse 78   Ht '5\' 8"'$  (1.727 m)   Wt 166 lb (75.3 kg)   SpO2 96%   BMI 25.24 kg/m   PHYSICAL EXAM: GEN:  AO x 3 in no acute distress HEENT: normal Dentition: Good Neck: JVP normal. +2 carotid upstrokes without bruits. No thyromegaly. Lungs: equal expansion, clear bilaterally CV: Apex is discrete and nondisplaced, regular rate and rhythm with 3 out of 6 holosystolic murmur Abd: soft, non-tender, non-distended; no bruit; positive bowel sounds Ext: no edema, ecchymoses, or cyanosis Vascular: 2+ femoral pulses, 2+ radial pulses       Skin: warm and dry without rash Neuro: CN II-XII grossly intact; motor and sensory grossly intact    DATA AND STUDIES:  EKG: From April 2023 demonstrates rate controlled atrial fibrillation with an occasional PVC; EKG today demonstrates rate controlled atrial fibrillation  2D ECHO: April 2023 demonstrates an ejection fraction of 55 to 60%, normal right ventricular function, severely dilated left atrium, trivial pericardial effusion, and severe mitral regurgitation  TEE: May 2023 demonstrates an ejection fraction of 60 to 65% with mildly reduced RV function and mild RV dilatation.  Severe mitral regurgitation is seen and there was one predominant central jet with another lateral jet and a more trivial medial jet.  The mean gradient across the valve is 1 mmHg with a valve area of close to 7 cm with a long posterior leaflet of 1.64 mm  CARDIAC CATH: N/A  STS RISK CALCULATOR: Pending  NHYA CLASS: 2-3    ASSESSMENT AND PLAN:   Severe mitral regurgitation  Chronic atrial fibrillation (HCC)  The patient has developed severe symptomatic mitral regurgitation due to an enlarged left atrium from chronic atrial fibrillation.  He has developed lifestyle limiting symptoms.  We had a long conversation about potential treatment options including medical therapy.  Given his ongoing symptoms he would like to proceed with definitive  treatment.  I did review his TEE which demonstrates anatomy amenable to transcatheter mitral edge-to-edge repair.  After discussing the procedure, its benefits, and potential risks the patient would like to proceed.  I will refer him for coronary angiography and a right heart catheterization.  He will be on vacation in late July and early August and would like the procedure perhaps before he goes on vacation.  I will review his imaging with our imaging section at our valve meeting.   I have personally reviewed the patients imaging data as summarized above.  I have reviewed the natural history of aortic stenosis with the patient and family members who are present today. We have discussed the limitations of medical therapy and the poor prognosis associated with symptomatic mitral regurgitation. We have also reviewed potential treatment options, including palliative medical therapy, conventional mitral surgery, and transcatheter mitral edge-to-edge repair. We discussed treatment options in the context of this patient's specific comorbid medical conditions.   All of the patient's questions were answered today. Will make further recommendations based on the results of studies outlined above.   Total time spent with patient today 50 minutes. This includes reviewing records, evaluating the patient and coordinating care.   Early Osmond, MD  01/18/2022 10:27 AM    [facility] [place], [place], [place]  [address] Phone: [phone]; Fax: [phone]

## 2022-01-18 NOTE — Progress Notes (Addendum)
Patient ID: [name] MRN: [id number] DOB/AGE: [birth year] 86 y.o.  Primary Care Physician:Harris, Gwyndolyn Saxon, MD Primary Cardiologist: Sharlette Dense CARDIOVASCULAR PROBLEM LIST:   1.  Coronary artery disease status post myocardial infarction in 2001 with balloon angioplasty of circumflex and LAD; most recent cardiac catheterization in 2003 resulted in bare-metal stenting of the left circumflex due to restenosis 2.  Hypertension 3.  Hyperlipidemia 4.  Persistent atrial fibrillation on apixaban 5.  Severe mitral regurgitation (atrial MR)   HISTORY OF PRESENT ILLNESS: The patient is a 86 y.o. male with the indicated medical history here for recommendations regarding severe mitral regurgitation.  The patient has been cared for by Dr. Percival Spanish for several years.  He was seen by Dr. Percival Spanish in early May and at that time reported relatively new onset dyspnea.  The patient tells me that he has developed exertional dyspnea over the last several months.  He tells me that this is affected his days of daily living.  He cannot golf without resting.  He has problems with shortness of breath just brushing his teeth or taking a shower.  He has trouble going to the mailbox.  He fortunately has not been short of breath at rest.  He denies any palpitations, paroxysmal nocturnal dyspnea, orthopnea, or peripheral edema.  He denies any exertional angina.  He has had no severe bleeding or bruising episodes.  He denies any signs or symptoms of stroke.  He sees a Pharmacist, community on a regular basis and had a cleaning recently.  He has no teeth that need to be extracted and reports good dental health.   Family History  Problem Relation Age of Onset   Heart failure Mother    Prostate cancer Father     Social History   Socioeconomic History   Marital status: Married    Spouse name: Not on file   Number of children: 2   Years of education: Not on file   Highest education level: Not on file   Occupational History   Occupation: retired  Tobacco Use   Smoking status: Former    Packs/day: 1.00    Years: 3.00    Total pack years: 3.00    Types: Cigarettes    Quit date: 09/26/1962    Years since quitting: 59.3   Smokeless tobacco: Never  Vaping Use   Vaping Use: Never used  Substance and Sexual Activity   Alcohol use: No   Drug use: No   Sexual activity: Not Currently  Other Topics Concern   Not on file  Social History Narrative   Lives at home with wife.  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     Social Determinants of Health   Financial Resource Strain: Not on file  Food Insecurity: Not on file  Transportation Needs: Not on file  Physical Activity: Not on file  Stress: Not on file  Social Connections: Not on file  Intimate Partner Violence: Not on file     Prior to Admission medications   Medication Sig Start Date End Date Taking? Authorizing Provider  apixaban (ELIQUIS) 5 MG TABS tablet Take 1 tablet (5 mg total) by mouth 2 (two) times daily. 11/05/21   Burnell Blanks, MD  atorvastatin (LIPITOR) 80 MG tablet Take 1 tablet (80 mg total) by mouth at bedtime. 11/26/13   Minus Breeding, MD  Cholecalciferol (VITAMIN D3 PO) Take by mouth daily.    [provider]  Cyanocobalamin (B-12 PO) Take by mouth daily.  [provider]  docusate sodium (COLACE) 100 MG capsule Take 1 capsule (100 mg total) by mouth 2 (two) times daily. 10/02/18   Danae Orleans, PA-C  esomeprazole (NEXIUM) 20 MG capsule Take 20 mg by mouth daily before breakfast.     [provider]  ferrous sulfate (FERROUSUL) 325 (65 FE) MG tablet Take 1 tablet (325 mg total) by mouth 3 (three) times daily with meals. Patient taking differently: Take 325 mg by mouth daily with breakfast. 10/02/18   Danae Orleans, PA-C  folic acid (FOLVITE) 355 MCG tablet Take 800 mcg by mouth daily.     [provider]  furosemide (LASIX) 20 MG tablet Take 1 tablet (20 mg total) by mouth daily. Take  20 mg daily for 3 days only 12/22/21   Minus Breeding, MD  levothyroxine (SYNTHROID) 25 MCG tablet Take 25 mcg by mouth daily before breakfast.    [provider]  methocarbamol (ROBAXIN) 500 MG tablet Take 1 tablet (500 mg total) by mouth every 6 (six) hours as needed for muscle spasms. 10/02/18   Danae Orleans, PA-C  metoprolol tartrate (LOPRESSOR) 25 MG tablet Take 1 tablet (25 mg total) by mouth 2 (two) times daily. 11/26/13   Minus Breeding, MD  niacin 500 MG tablet Take 500 mg by mouth daily.    [provider]  nitroGLYCERIN (NITROSTAT) 0.4 MG SL tablet Place 0.4 mg under the tongue every 5 (five) minutes as needed for chest pain.    [provider]  ondansetron (ZOFRAN) 4 MG tablet Take 1 tablet (4 mg total) by mouth every 6 (six) hours. 10/27/21   Sherrell Puller, PA-C  oxyCODONE-acetaminophen (PERCOCET/ROXICET) 5-325 MG tablet Take 1 tablet by mouth every 6 (six) hours as needed for severe pain.    [provider]  polyethylene glycol (MIRALAX / GLYCOLAX) packet Take 17 g by mouth 2 (two) times daily. Patient taking differently: Take 17 g by mouth daily as needed. 10/02/18   Danae Orleans, PA-C  traMADol (ULTRAM) 50 MG tablet Take 1 tablet (50 mg total) by mouth every 6 (six) hours as needed. Patient taking differently: Take 50 mg by mouth every 6 (six) hours as needed for moderate pain. 10/27/21   Sherrell Puller, PA-C    No Known Allergies  REVIEW OF SYSTEMS:  General: no fevers/chills/night sweats Eyes: no blurry vision, diplopia, or amaurosis ENT: no sore throat or hearing loss Resp: no cough, wheezing, or hemoptysis CV: no edema or palpitations GI: no abdominal pain, nausea, vomiting, diarrhea, or constipation GU: no dysuria, frequency, or hematuria Skin: no rash Neuro: no headache, numbness, tingling, or weakness of extremities Musculoskeletal: no joint pain or swelling Heme: no bleeding, DVT, or easy bruising Endo: no polydipsia or  polyuria  BP 118/60   Pulse 78   Ht '5\' 8"'$  (1.727 m)   Wt 166 lb (75.3 kg)   SpO2 96%   BMI 25.24 kg/m   PHYSICAL EXAM: GEN:  AO x 3 in no acute distress HEENT: normal Dentition: Good Neck: JVP normal. +2 carotid upstrokes without bruits. No thyromegaly. Lungs: equal expansion, clear bilaterally CV: Apex is discrete and nondisplaced, regular rate and rhythm with 3 out of 6 holosystolic murmur Abd: soft, non-tender, non-distended; no bruit; positive bowel sounds Ext: no edema, ecchymoses, or cyanosis Vascular: 2+ femoral pulses, 2+ radial pulses       Skin: warm and dry without rash Neuro: CN II-XII grossly intact; motor and sensory grossly intact    DATA AND STUDIES:  EKG: From April 2023 demonstrates rate controlled atrial fibrillation with an occasional PVC; EKG today demonstrates rate controlled atrial fibrillation  2D ECHO: April 2023 demonstrates an ejection fraction of 55 to 60%, normal right ventricular function, severely dilated left atrium, trivial pericardial effusion, and severe mitral regurgitation  TEE: May 2023 demonstrates an ejection fraction of 60 to 65% with mildly reduced RV function and mild RV dilatation.  Severe mitral regurgitation is seen and there was one predominant central jet with another lateral jet and a more trivial medial jet.  The mean gradient across the valve is 1 mmHg with a valve area of close to 7 cm with a long posterior leaflet of 1.64 mm  CARDIAC CATH: N/A  STS RISK CALCULATOR:  Isolated MVR Risk of Mortality: 4.735% Renal Failure: 5.152% Permanent Stroke: 2.385% Prolonged Ventilation: 12.681% DSW Infection: 0.094% Reoperation: 7.818% Morbidity or Mortality: 22.681% Short Length of Stay: 16.862% Long Length of Stay: 10.767%  MV Repair Risk of Mortality: 3.257% Renal Failure: 2.629% Permanent Stroke: 2.377% Prolonged Ventilation: 7.728% DSW Infection: 0.049% Reoperation: 5.995% Morbidity or  Mortality: 15.734% Short Length of Stay: 24.597% Long Length of Stay: 7.493%  NHYA CLASS: 2-3  ASSESSMENT AND PLAN:   Severe mitral regurgitation  Chronic atrial fibrillation (HCC)  The patient has developed severe symptomatic mitral regurgitation due to an enlarged left atrium from chronic atrial fibrillation.  He has developed lifestyle limiting symptoms.  We had a long conversation about potential treatment options including medical therapy.  Given his ongoing symptoms he would like to proceed with definitive treatment.  I did review his TEE which demonstrates anatomy amenable to transcatheter mitral edge-to-edge repair.  After discussing the procedure, its benefits, and potential risks the patient would like to proceed.  I will refer him for coronary angiography and a right heart catheterization.  He will be on vacation in late July and early August and would like the procedure perhaps before he goes on vacation.  I will review his imaging with our imaging section at our valve meeting.   I have personally reviewed the patients imaging data as summarized above.  I have reviewed the natural history of aortic stenosis with the patient and family members who are present today. We have discussed the limitations of medical therapy and the poor prognosis associated with symptomatic mitral regurgitation. We have also reviewed potential treatment options, including palliative medical therapy, conventional mitral surgery, and transcatheter mitral edge-to-edge repair. We discussed treatment options in the context of this patient's specific comorbid medical conditions.   All of the patient's questions were answered today. Will make further recommendations based on the results of studies outlined above.   Total time spent with patient today 50 minutes. This includes reviewing records, evaluating the patient and coordinating care.   Early Osmond, MD  01/18/2022 10:27 AM    [facility], [place], [place]  [address] Phone: [phone]; Fax: [phone]

## 2022-01-18 NOTE — Patient Instructions (Signed)
Medication Instructions:  No changes today. *If you need a refill on your cardiac medications before your next appointment, please call your pharmacy*   Lab Work: Today: cbc, bmet If you have labs (blood work) drawn today and your tests are completely normal, you will receive your results only by: [facility] (if you have MyChart) OR A paper copy in the mail If you have any lab test that is abnormal or we need to change your treatment, we will call you to review the results.   Testing/Procedures: Your physician has requested that you have a cardiac catheterization. Cardiac catheterization is used to diagnose and/or treat various heart conditions. Doctors may recommend this procedure for a number of different reasons. The most common reason is to evaluate chest pain. Chest pain can be a symptom of coronary artery disease (CAD), and cardiac catheterization can show whether plaque is narrowing or blocking your heart's arteries. This procedure is also used to evaluate the valves, as well as measure the blood flow and oxygen levels in different parts of your heart. For further information please visit [email or web address]. Please follow instruction sheet, as given.   Follow-Up: Per Structural Heart Valve Team   Other Instructions  [facility] OFFICE [place], [address] [place] [address] Dept: [phone] Loc: [facility]  01/18/2022  You are scheduled for a Cardiac Catheterization on Wednesday, June 28 with Dr. Lenna Sciara.  1. Please arrive at the Main Entrance A at [facility]: [place], [place] [address] at 8:00 AM (This time is two hours before your procedure to ensure your preparation). Free valet parking service is available.   Special note: Every effort is made to have your procedure done on time. Please understand that emergencies sometimes delay  scheduled procedures.  2. Diet: Do not eat solid foods after midnight.  You may have clear liquids until 5 AM upon the day of the procedure.  3. Labs: You will need to have blood drawn today. You do not need to be fasting.  4. Medication instructions in preparation for your procedure:   Contrast Allergy: No  DO NOT TAKE ELIQUIS AFTER SUNDAY JUNE 25.  YOU WILL BE INSTRUCTED ON WHEN TO RESTART.  On the morning of your procedure, take Aspirin 81 MG and any morning medicines NOT listed above.  You may use sips of water.  5. Plan to go home the same day, you will only stay overnight if medically necessary. 6. You MUST have a responsible adult to drive you home. 7. An adult MUST be with you the first 24 hours after you arrive home. 8. Bring a current list of your medications, and the last time and date medication taken. 9. Bring ID and current insurance cards. 10.Please wear clothes that are easy to get on and off and wear slip-on shoes.  Thank you for allowing [place] to care for you!   -- [facility]

## 2022-01-18 NOTE — Addendum Note (Signed)
Addended by: Rodman Key on: 01/18/2022 02:20 PM   Modules accepted: Orders

## 2022-01-25 ENCOUNTER — Telehealth: Payer: Self-pay | Admitting: *Deleted

## 2022-01-27 ENCOUNTER — Ambulatory Visit (HOSPITAL_COMMUNITY)
Admission: RE | Admit: 2022-01-27 | Discharge: 2022-01-27 | Disposition: A | Discharge status: Home / Self Care | Payer: Medicare HMO | Attending: Internal Medicine | Admitting: Internal Medicine

## 2022-01-27 ENCOUNTER — Encounter (HOSPITAL_COMMUNITY): Payer: Self-pay | Admitting: Internal Medicine

## 2022-01-27 ENCOUNTER — Other Ambulatory Visit: Payer: Self-pay

## 2022-01-27 ENCOUNTER — Encounter (HOSPITAL_COMMUNITY)
Admission: RE | Disposition: A | Discharge status: Home / Self Care | Payer: Self-pay | Source: Home / Self Care | Attending: Internal Medicine

## 2022-01-27 DIAGNOSIS — Z87891 Personal history of nicotine dependence: Secondary | ICD-10-CM | POA: Insufficient documentation

## 2022-01-27 DIAGNOSIS — I340 Nonrheumatic mitral (valve) insufficiency: Secondary | ICD-10-CM | POA: Insufficient documentation

## 2022-01-27 DIAGNOSIS — E785 Hyperlipidemia, unspecified: Secondary | ICD-10-CM | POA: Insufficient documentation

## 2022-01-27 DIAGNOSIS — I10 Essential (primary) hypertension: Secondary | ICD-10-CM | POA: Insufficient documentation

## 2022-01-27 DIAGNOSIS — I2510 Atherosclerotic heart disease of native coronary artery without angina pectoris: Secondary | ICD-10-CM | POA: Diagnosis not present

## 2022-01-27 DIAGNOSIS — Z955 Presence of coronary angioplasty implant and graft: Secondary | ICD-10-CM | POA: Diagnosis not present

## 2022-01-27 DIAGNOSIS — I482 Chronic atrial fibrillation, unspecified: Secondary | ICD-10-CM | POA: Diagnosis not present

## 2022-01-27 DIAGNOSIS — Z7901 Long term (current) use of anticoagulants: Secondary | ICD-10-CM | POA: Diagnosis not present

## 2022-01-27 HISTORY — PX: RIGHT/LEFT HEART CATH AND CORONARY ANGIOGRAPHY: CATH118266

## 2022-01-27 LAB — POCT I-STAT 7, (LYTES, BLD GAS, ICA,H+H)
Acid-base deficit: 2 mmol/L (ref 0.0–2.0)
Bicarbonate: 24.2 mmol/L (ref 20.0–28.0)
Calcium, Ion: 1.25 mmol/L (ref 1.15–1.40)
HCT: 32 % — ABNORMAL LOW (ref 39.0–52.0)
Hemoglobin: 10.9 g/dL — ABNORMAL LOW (ref 13.0–17.0)
O2 Saturation: 93 %
Potassium: 3.6 mmol/L (ref 3.5–5.1)
Sodium: 143 mmol/L (ref 135–145)
TCO2: 25 mmol/L (ref 22–32)
pCO2 arterial: 43.7 mmHg (ref 32–48)
pH, Arterial: 7.351 (ref 7.35–7.45)
pO2, Arterial: 71 mmHg — ABNORMAL LOW (ref 83–108)

## 2022-01-27 LAB — POCT I-STAT EG7
Acid-Base Excess: 0 mmol/L (ref 0.0–2.0)
Acid-base deficit: 1 mmol/L (ref 0.0–2.0)
Bicarbonate: 24.6 mmol/L (ref 20.0–28.0)
Bicarbonate: 25.3 mmol/L (ref 20.0–28.0)
Calcium, Ion: 1.23 mmol/L (ref 1.15–1.40)
Calcium, Ion: 1.29 mmol/L (ref 1.15–1.40)
HCT: 32 % — ABNORMAL LOW (ref 39.0–52.0)
HCT: 32 % — ABNORMAL LOW (ref 39.0–52.0)
Hemoglobin: 10.9 g/dL — ABNORMAL LOW (ref 13.0–17.0)
Hemoglobin: 10.9 g/dL — ABNORMAL LOW (ref 13.0–17.0)
O2 Saturation: 60 %
O2 Saturation: 71 %
Potassium: 3.6 mmol/L (ref 3.5–5.1)
Potassium: 3.8 mmol/L (ref 3.5–5.1)
Sodium: 142 mmol/L (ref 135–145)
Sodium: 143 mmol/L (ref 135–145)
TCO2: 26 mmol/L (ref 22–32)
TCO2: 27 mmol/L (ref 22–32)
pCO2, Ven: 43.2 mmHg — ABNORMAL LOW (ref 44–60)
pCO2, Ven: 45 mmHg (ref 44–60)
pH, Ven: 7.358 (ref 7.25–7.43)
pH, Ven: 7.363 (ref 7.25–7.43)
pO2, Ven: 32 mmHg (ref 32–45)
pO2, Ven: 39 mmHg (ref 32–45)

## 2022-01-27 SURGERY — RIGHT/LEFT HEART CATH AND CORONARY ANGIOGRAPHY
Anesthesia: LOCAL

## 2022-01-27 MED ORDER — SODIUM CHLORIDE 0.9% FLUSH: 3.0000 mL | Freq: Two times a day (BID) | INTRAVENOUS | Status: DC

## 2022-01-27 MED ORDER — ACETAMINOPHEN 325 MG PO TABS: 650.0000 mg | ORAL_TABLET | ORAL | Status: DC | PRN

## 2022-01-27 MED ORDER — SODIUM CHLORIDE 0.9 % WEIGHT BASED INFUSION: 1.0000 mL/kg/h | INTRAVENOUS | Status: DC

## 2022-01-27 MED ORDER — HEPARIN SODIUM (PORCINE) 1000 UNIT/ML IJ SOLN
INTRAMUSCULAR | Status: AC
  Filled 2022-01-27: qty 10

## 2022-01-27 MED ORDER — HYDRALAZINE HCL 20 MG/ML IJ SOLN: 10.0000 mg | INTRAMUSCULAR | Status: DC | PRN

## 2022-01-27 MED ORDER — SODIUM CHLORIDE 0.9 % WEIGHT BASED INFUSION
3.0000 mL/kg/h | INTRAVENOUS | Status: AC
  Administered 2022-01-27: 3 mL/kg/h via INTRAVENOUS

## 2022-01-27 MED ORDER — ASPIRIN 81 MG PO CHEW: 81.0000 mg | CHEWABLE_TABLET | ORAL | Status: DC

## 2022-01-27 MED ORDER — VERAPAMIL HCL 2.5 MG/ML IV SOLN
INTRAVENOUS | Status: AC
Start: 2022-01-27 — End: ?
  Filled 2022-01-27: qty 2

## 2022-01-27 MED ORDER — SODIUM CHLORIDE 0.9 % IV SOLN: 250.0000 mL | INTRAVENOUS | Status: DC | PRN

## 2022-01-27 MED ORDER — IOHEXOL 350 MG/ML SOLN
INTRAVENOUS | Status: DC | PRN
  Administered 2022-01-27: 30 mL

## 2022-01-27 MED ORDER — HEPARIN (PORCINE) IN NACL 1000-0.9 UT/500ML-% IV SOLN
INTRAVENOUS | Status: DC | PRN
  Administered 2022-01-27 (×2): 500 mL

## 2022-01-27 MED ORDER — LIDOCAINE HCL (PF) 1 % IJ SOLN
INTRAMUSCULAR | Status: AC
  Filled 2022-01-27: qty 30

## 2022-01-27 MED ORDER — LIDOCAINE HCL (PF) 1 % IJ SOLN
INTRAMUSCULAR | Status: DC | PRN
  Administered 2022-01-27 (×2): 2 mL via SUBCUTANEOUS

## 2022-01-27 MED ORDER — MIDAZOLAM HCL 2 MG/2ML IJ SOLN
INTRAMUSCULAR | Status: DC | PRN
  Administered 2022-01-27: 1 mg via INTRAVENOUS

## 2022-01-27 MED ORDER — FENTANYL CITRATE (PF) 100 MCG/2ML IJ SOLN
INTRAMUSCULAR | Status: DC | PRN
  Administered 2022-01-27: 25 ug via INTRAVENOUS

## 2022-01-27 MED ORDER — HEPARIN (PORCINE) IN NACL 1000-0.9 UT/500ML-% IV SOLN
INTRAVENOUS | Status: AC
  Filled 2022-01-27: qty 1000

## 2022-01-27 MED ORDER — HEPARIN SODIUM (PORCINE) 1000 UNIT/ML IJ SOLN
INTRAMUSCULAR | Status: DC | PRN
  Administered 2022-01-27: 5000 [IU] via INTRAVENOUS

## 2022-01-27 MED ORDER — SODIUM CHLORIDE 0.9% FLUSH: 3.0000 mL | INTRAVENOUS | Status: DC | PRN

## 2022-01-27 MED ORDER — FENTANYL CITRATE (PF) 100 MCG/2ML IJ SOLN
INTRAMUSCULAR | Status: AC
  Filled 2022-01-27: qty 2

## 2022-01-27 MED ORDER — ONDANSETRON HCL 4 MG/2ML IJ SOLN: 4.0000 mg | Freq: Four times a day (QID) | INTRAMUSCULAR | Status: DC | PRN

## 2022-01-27 MED ORDER — VERAPAMIL HCL 2.5 MG/ML IV SOLN
INTRAVENOUS | Status: DC | PRN
  Administered 2022-01-27: 10 mL via INTRA_ARTERIAL

## 2022-01-27 MED ORDER — LABETALOL HCL 5 MG/ML IV SOLN: 10.0000 mg | INTRAVENOUS | Status: DC | PRN

## 2022-01-27 MED ORDER — MIDAZOLAM HCL 2 MG/2ML IJ SOLN
INTRAMUSCULAR | Status: AC
  Filled 2022-01-27: qty 2

## 2022-01-27 SURGICAL SUPPLY — 13 items
BAND CMPR LRG ZPHR (HEMOSTASIS) ×1
BAND ZEPHYR COMPRESS 30 LONG (HEMOSTASIS) ×1 IMPLANT
CATH BALLN WEDGE 5F 110CM (CATHETERS) ×1 IMPLANT
CATH INFINITI 6F ANG MULTIPACK (CATHETERS) ×1 IMPLANT
CATH INFINITI 6F FL3.5 (CATHETERS) ×1 IMPLANT
GLIDESHEATH SLEND SS 6F .021 (SHEATH) ×1 IMPLANT
GUIDEWIRE .025 260CM (WIRE) ×1 IMPLANT
KIT HEART LEFT (KITS) ×2 IMPLANT
PACK CARDIAC CATHETERIZATION (CUSTOM PROCEDURE TRAY) ×2 IMPLANT
SHEATH GLIDE SLENDER 4/5FR (SHEATH) ×1 IMPLANT
TRANSDUCER W/STOPCOCK (MISCELLANEOUS) ×2 IMPLANT
TUBING CIL FLEX 10 FLL-RA (TUBING) ×2 IMPLANT
WIRE EMERALD 3MM-J .035X260CM (WIRE) ×1 IMPLANT

## 2022-01-27 NOTE — Interval H&P Note (Signed)
History and Physical Interval Note:  01/27/2022 9:31 AM  [name]  has presented today for surgery, with the diagnosis of severe MR.  The various methods of treatment have been discussed with the patient and family. After consideration of risks, benefits and other options for treatment, the patient has consented to  Procedure(s): RIGHT/LEFT HEART CATH AND CORONARY ANGIOGRAPHY (N/A) as a surgical intervention.  The patient's history has been reviewed, patient examined, no change in status, stable for surgery.  I have reviewed the patient's chart and labs.  Questions were answered to the patient's satisfaction.     Early Osmond

## 2022-02-08 DIAGNOSIS — M79671 Pain in right foot: Secondary | ICD-10-CM | POA: Diagnosis not present

## 2022-03-10 ENCOUNTER — Telehealth: Payer: Self-pay

## 2022-03-10 NOTE — Telephone Encounter (Signed)
Confirmed with Dr. Ali Lowe that the patient need an appointment with [facility] for Clip eval, NOT Surgery.  Cancelled Dr. Abran Duke consult this Friday. Scheduled the patient for consult with Dr. Aundra Dubin 03/12/22 at 1140. Instructed Ms. Balaban to have the patient call tomorrow to confirm. She was grateful for call and agrees with plan.

## 2022-03-11 ENCOUNTER — Telehealth: Payer: Self-pay | Admitting: Cardiology

## 2022-03-11 DIAGNOSIS — M79671 Pain in right foot: Secondary | ICD-10-CM | POA: Diagnosis not present

## 2022-03-11 NOTE — Telephone Encounter (Signed)
See 03/11/22 phone note.

## 2022-03-11 NOTE — Telephone Encounter (Signed)
Patient returned call to confirm changed appointment for tomorrow from Dr. Kipp Brood to Dr. Aundra Dubin at 11:40am. Directions reviewed with understanding. Parking code shared with the patient for HF parking access.   Kathyrn Drown NP-C Structural Heart Team  Pager: [phone] Phone: [phone]

## 2022-03-12 ENCOUNTER — Encounter: Payer: Medicare HMO | Admitting: Thoracic Surgery (Cardiothoracic Vascular Surgery)

## 2022-03-12 ENCOUNTER — Ambulatory Visit (HOSPITAL_COMMUNITY)
Admission: RE | Admit: 2022-03-12 | Discharge: 2022-03-12 | Disposition: A | Discharge status: Home / Self Care | Payer: Medicare HMO | Source: Ambulatory Visit | Attending: Cardiology | Admitting: Cardiology

## 2022-03-12 ENCOUNTER — Other Ambulatory Visit (HOSPITAL_COMMUNITY): Payer: Self-pay

## 2022-03-12 VITALS — BP 125/70 | HR 68 | Wt 171.0 lb

## 2022-03-12 DIAGNOSIS — Z8616 Personal history of COVID-19: Secondary | ICD-10-CM | POA: Diagnosis not present

## 2022-03-12 DIAGNOSIS — I340 Nonrheumatic mitral (valve) insufficiency: Secondary | ICD-10-CM | POA: Diagnosis not present

## 2022-03-12 DIAGNOSIS — I4819 Other persistent atrial fibrillation: Secondary | ICD-10-CM | POA: Diagnosis not present

## 2022-03-12 DIAGNOSIS — I110 Hypertensive heart disease with heart failure: Secondary | ICD-10-CM | POA: Insufficient documentation

## 2022-03-12 DIAGNOSIS — Z79899 Other long term (current) drug therapy: Secondary | ICD-10-CM | POA: Insufficient documentation

## 2022-03-12 DIAGNOSIS — I2510 Atherosclerotic heart disease of native coronary artery without angina pectoris: Secondary | ICD-10-CM | POA: Insufficient documentation

## 2022-03-12 DIAGNOSIS — I5032 Chronic diastolic (congestive) heart failure: Secondary | ICD-10-CM | POA: Diagnosis not present

## 2022-03-12 DIAGNOSIS — I341 Nonrheumatic mitral (valve) prolapse: Secondary | ICD-10-CM | POA: Insufficient documentation

## 2022-03-12 DIAGNOSIS — I482 Chronic atrial fibrillation, unspecified: Secondary | ICD-10-CM | POA: Diagnosis not present

## 2022-03-12 DIAGNOSIS — I252 Old myocardial infarction: Secondary | ICD-10-CM | POA: Insufficient documentation

## 2022-03-12 LAB — BASIC METABOLIC PANEL
Anion gap: 7 (ref 5–15)
BUN: 20 mg/dL (ref 8–23)
CO2: 27 mmol/L (ref 22–32)
Calcium: 9.2 mg/dL (ref 8.9–10.3)
Chloride: 108 mmol/L (ref 98–111)
Creatinine, Ser: 1.4 mg/dL — ABNORMAL HIGH (ref 0.61–1.24)
GFR, Estimated: 50 mL/min — ABNORMAL LOW (ref >60)
Glucose, Bld: 110 mg/dL — ABNORMAL HIGH (ref 70–99)
Potassium: 3.7 mmol/L (ref 3.5–5.1)
Sodium: 142 mmol/L (ref 135–145)

## 2022-03-12 LAB — BRAIN NATRIURETIC PEPTIDE: B Natriuretic Peptide: 917.2 pg/mL — ABNORMAL HIGH (ref 0.0–100.0)

## 2022-03-12 MED ORDER — DAPAGLIFLOZIN PROPANEDIOL 10 MG PO TABS: 10.0000 mg | ORAL_TABLET | Freq: Every day | ORAL | 3 refills | Status: DC

## 2022-03-12 NOTE — Patient Instructions (Addendum)
Start farxiga '10mg'$  daily   Labs done today, your results will be available in MyChart, we will contact you for abnormal readings.  Repeat blood work in 10 days  Your physician recommends that you schedule a follow-up appointment as scheduled   If you have any questions or concerns before your next appointment please send [place] a message through [place] or call our office at [phone].    TO LEAVE A MESSAGE FOR THE NURSE SELECT OPTION 2, PLEASE LEAVE A MESSAGE INCLUDING: YOUR NAME DATE OF BIRTH CALL BACK NUMBER REASON FOR CALL**this is important as we prioritize the call backs  YOU WILL RECEIVE A CALL BACK THE SAME DAY AS LONG AS YOU CALL BEFORE 4:00 PM  At the [facility], you and your health needs are our priority. As part of our continuing mission to provide you with exceptional heart care, we have created designated Provider Care Teams. These Care Teams include your primary Cardiologist (physician) and Advanced Practice Providers (APPs- Physician Assistants and Nurse Practitioners) who all work together to provide you with the care you need, when you need it.   You may see any of the following providers on your designated Care Team at your next follow up: Dr Glori Bickers Dr Haynes Kerns, NP Lyda Jester, [place] [facility] [place], [place] Audry Riles, PharmD   Please be sure to bring in all your medications bottles to every appointment.

## 2022-03-14 NOTE — Progress Notes (Signed)
PCP: Shirline Frees, MD Cardiology: Dr. Percival Spanish HF Cardiology: Dr. Aundra Dubin  86 y.o. with history of persistent atrial fibrillation and mitral regurgitation was referred by Dr. Ali Lowe for CHF evaluation prior to Mitraclip.  Patient has history of CAD with MI and POBA to LCx and LAD in 2001 as well as BMS to LCx in 2003.  He reported exertional dyspnea beginning last year that he attributed to the after-effects of COVID-19 x 2 episodes.  He saw Dr. Percival Spanish in 4/23 as a pre-op for lithotripsy and was noted to be in atrial fibrillation (first time that AF had been recorded).  Echo was then done, showing preserved LV EF but severe MR.  TEE in 5/23 showed EF 60-65%, mildly decreased RV systolic function, severe LAE, mild bileaflet myxomatous MV prolapse with severe MR (central).  Cath in 6/23 showed mild nonobstructive coronary disease.  Patient was sent to Dr Ali Lowe for evaluation for Mitraclip, which is tentatively planned for later this month.   Patient reports that he is breathing better on Lasix.  He is still in atrial fibrillation (does not feel it). He is short of breath/tired with heavy yardwork or if he walks fast. Generally does ok if he walks at a slow/steady pace.  He is short of breath walking up stairs.  No lightheadedness/syncope.  No chest pain.  Main complaint recently has actually been plantar fasciitis, recently had a steroid injection.   Labs (6/23): K 4, creatinine 1.42  ECG (personally reviewed): atrial fibrillation, nonspecific T wave flattening  PMH: 1. CAD: MI in 2001 with POBA to CFX and LAD.  - LHC 2003 with BMS to LCx. - LHC (6/23): minimal CAD, patent LCx stent.  2. Mitral regurgitation: TEE (5/23) with EF 60-65%, mildly decreased RV systolic function, severe LAE, mild bileaflet myxomatous MV prolapse with severe MR (central).  - RHC (5/23): mean 5, PA 38/10 mean 23, mean PCWP 17 with v waves to 34, CI 3.5. 3. Atrial fibrillation: Persistent since at least 4/23.  4.  HTN 5. Hyperlipidemia 6. Hypothyroidism 7. COVID-19 x 2 8. Nephrolithiasis  Social History   Socioeconomic History   Marital status: Married    Spouse name: Not on file   Number of children: 2   Years of education: Not on file   Highest education level: Not on file  Occupational History   Occupation: retired  Tobacco Use   Smoking status: Former    Packs/day: 1.00    Years: 3.00    Total pack years: 3.00    Types: Cigarettes    Quit date: 09/26/1962    Years since quitting: 59.5   Smokeless tobacco: Never  Vaping Use   Vaping Use: Never used  Substance and Sexual Activity   Alcohol use: No   Drug use: No   Sexual activity: Not Currently  Other Topics Concern   Not on file  Social History Narrative   Lives at home with wife.  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     Social Determinants of Health   Financial Resource Strain: Not on file  Food Insecurity: Not on file  Transportation Needs: Not on file  Physical Activity: Not on file  Stress: Not on file  Social Connections: Not on file  Intimate Partner Violence: Not on file   Family History  Problem Relation Age of Onset   Heart failure Mother    Prostate cancer Father    ROS: All systems reviewed and negative except as per HPI.   Current Outpatient Medications  Medication Sig Dispense Refill   apixaban (ELIQUIS) 5 MG TABS tablet Take 1 tablet (5 mg total) by mouth 2 (two) times daily. 60 tablet 5   atorvastatin (LIPITOR) 80 MG tablet Take 1 tablet (80 mg total) by mouth at bedtime. 90 tablet 3   Cholecalciferol (VITAMIN D3 PO) Take by mouth daily.     Cyanocobalamin (B-12 PO) Take by mouth daily.     dapagliflozin propanediol (FARXIGA) 10 MG TABS tablet Take 1 tablet (10 mg total) by mouth daily before breakfast. 90 tablet 3   docusate sodium (COLACE) 100 MG capsule Take 1 capsule (100 mg total) by mouth 2 (two) times daily. (Patient taking differently: Take 100 mg by mouth daily as needed for mild constipation or moderate  constipation.) 10 capsule 0   esomeprazole (NEXIUM) 20 MG capsule Take 20 mg by mouth daily before breakfast.      ferrous sulfate (FERROUSUL) 325 (65 FE) MG tablet Take 1 tablet (325 mg total) by mouth 3 (three) times daily with meals. (Patient taking differently: Take 325 mg by mouth daily with breakfast.)  3   folic acid (FOLVITE) 865 MCG tablet Take 800 mcg by mouth daily.      furosemide (LASIX) 20 MG tablet Take 1 tablet (20 mg total) by mouth daily. Take 20 mg daily for 3 days only 90 tablet 3   levothyroxine (SYNTHROID) 25 MCG tablet Take 25 mcg by mouth daily before breakfast.     methocarbamol (ROBAXIN) 500 MG tablet Take 1 tablet (500 mg total) by mouth every 6 (six) hours as needed for muscle spasms. 40 tablet 0   metoprolol tartrate (LOPRESSOR) 25 MG tablet Take 1 tablet (25 mg total) by mouth 2 (two) times daily. 180 tablet 3   niacin 500 MG tablet Take 500 mg by mouth daily.     nitroGLYCERIN (NITROSTAT) 0.4 MG SL tablet Place 0.4 mg under the tongue every 5 (five) minutes as needed for chest pain.     ondansetron (ZOFRAN) 4 MG tablet Take 1 tablet (4 mg total) by mouth every 6 (six) hours. 12 tablet 0   polyethylene glycol (MIRALAX / GLYCOLAX) packet Take 17 g by mouth 2 (two) times daily. (Patient taking differently: Take 17 g by mouth daily as needed for mild constipation or moderate constipation.) 14 each 0   traMADol (ULTRAM) 50 MG tablet Take 1 tablet (50 mg total) by mouth every 6 (six) hours as needed. (Patient taking differently: Take 50 mg by mouth every 6 (six) hours as needed for moderate pain.) 15 tablet 0   oxyCODONE-acetaminophen (PERCOCET/ROXICET) 5-325 MG tablet Take 1 tablet by mouth every 6 (six) hours as needed for severe pain (Kidney stones).     No current facility-administered medications for this encounter.   BP 125/70   Pulse 68   Wt 77.6 kg (171 lb)   SpO2 97%   BMI 26.00 kg/m  General: NAD Neck: JVP 8-9 cm, no thyromegaly or thyroid nodule.  Lungs:  Clear to auscultation bilaterally with normal respiratory effort. CV: Nondisplaced PMI.  Heart irregular S1/S2, no S3/S4, 2/6 HSM apex.  Trace ankle edema.  No carotid bruit.  Normal pedal pulses.  Abdomen: Soft, nontender, no hepatosplenomegaly, no distention.  Skin: Intact without lesions or rashes.  Neurologic: Alert and oriented x 3.  Psych: Normal affect. Extremities: No clubbing or cyanosis.  HEENT: Normal.   Assessment/Plan: 1. Mitral regurgitation: TEE in 5/23 with EF 60-65%, mildly decreased RV systolic function, severe LAE, mild bileaflet myxomatous  MV prolapse with severe MR (central). Suspect mixed picture of primary and functional MR, with myxomatous MV prolapse as well as a component of atrial functional MR with central jet and enlarged left atrium. I agree with Dr. Ali Lowe that he appears to be a reasonable candidate for Mitraclip.  mTEER would also likely give [place] the best chance for getting him back into NSR long-term.  Would favor proceeding. 2. Atrial fibrillation: Persistent since at least 4/23.  Suspect this may play a role in mitral regurgitation (atrial functional MR), and MR also likely perpetuates the atrial fibrillation by ongoing LA dilation.  I do not think he would hold NSR long-term with severe MR.  - Proceed with Mitraclip.  - After Mitraclip, would favor trial of amiodarone followed by DCCV.   3. Chronic diastolic CHF: In setting of severe MR.  He is mildly volume overloaded by exam.   - Continue Lasix 20 mg daily.  - Add Farxiga 10 mg daily, BMET/BNP today and BMET in 10 days.   Followup in 1 month after Mitraclip procedure.  At that time, would initiate amiodarone and plan eventual DCCV attempt.   Loralie Champagne 03/14/2022

## 2022-03-17 ENCOUNTER — Other Ambulatory Visit: Payer: Self-pay

## 2022-03-17 DIAGNOSIS — I340 Nonrheumatic mitral (valve) insufficiency: Secondary | ICD-10-CM

## 2022-03-18 DIAGNOSIS — L578 Other skin changes due to chronic exposure to nonionizing radiation: Secondary | ICD-10-CM | POA: Diagnosis not present

## 2022-03-18 DIAGNOSIS — L821 Other seborrheic keratosis: Secondary | ICD-10-CM | POA: Diagnosis not present

## 2022-03-18 DIAGNOSIS — L570 Actinic keratosis: Secondary | ICD-10-CM | POA: Diagnosis not present

## 2022-03-18 DIAGNOSIS — L720 Epidermal cyst: Secondary | ICD-10-CM | POA: Diagnosis not present

## 2022-03-22 ENCOUNTER — Other Ambulatory Visit: Payer: Self-pay | Admitting: Cardiology

## 2022-03-22 DIAGNOSIS — I5022 Chronic systolic (congestive) heart failure: Secondary | ICD-10-CM | POA: Diagnosis not present

## 2022-03-22 NOTE — Pre-Procedure Instructions (Signed)
Surgical Instructions    Your procedure is scheduled on Thursday, August 24th.  Report to [facility] Main Entrance "A" at 5:45 A.M., then check in with the Admitting office.  Call this number if you have problems the morning of surgery:  [phone]   If you have any questions prior to your surgery date call [phone]: Open Monday-Friday 8am-4pm    Remember:  Do not eat or drink after midnight the night before your surgery  Take Eliquis as directed on 03/22/22 then STOP. The morning of the procedure, ONLY TAKE Synthroid (levothyroxine) and Lopressor (metoprolol).  HOLD dapagliflozin propanediol (FARXIGA) for 72 hours prior to surgery. Last dose should be Sunday, 03/21/22.  As of today, STOP taking any Aspirin (unless otherwise instructed by your surgeon) Aleve, Naproxen, Ibuprofen, Motrin, Advil, Goody's, BC's, all herbal medications, fish oil, and all vitamins.                     Do NOT Smoke (Tobacco/Vaping) for 24 hours prior to your procedure.  If you use a CPAP at night, you may bring your mask/headgear for your overnight stay.   Contacts, glasses, piercing's, hearing aid's, dentures or partials may not be worn into surgery, please bring cases for these belongings.    For patients admitted to the hospital, discharge time will be determined by your treatment team.   Patients discharged the day of surgery will not be allowed to drive home, and someone needs to stay with them for 24 hours.  SURGICAL WAITING ROOM VISITATION Patients having surgery or a procedure may have no more than 2 support people in the waiting area - these visitors may rotate.   Children under the age of 32 must have an adult with them who is not the patient. If the patient needs to stay at the hospital during part of their recovery, the visitor guidelines for inpatient rooms apply. Pre-op nurse will coordinate an appropriate time for 1 support person to accompany patient in pre-op.  This support person  may not rotate.   Please refer to the [facility] website for the visitor guidelines for Inpatients (after your surgery is over and you are in a regular room).    Special instructions:   [facility]- Preparing For Surgery  Before surgery, you can play an important role. Because skin is not sterile, your skin needs to be as free of germs as possible. You can reduce the number of germs on your skin by washing with CHG (chlorahexidine gluconate) Soap before surgery.  CHG is an antiseptic cleaner which kills germs and bonds with the skin to continue killing germs even after washing.    Oral Hygiene is also important to reduce your risk of infection.  Remember - BRUSH YOUR TEETH THE MORNING OF SURGERY WITH YOUR REGULAR TOOTHPASTE  Please do not use if you have an allergy to CHG or antibacterial soaps. If your skin becomes reddened/irritated stop using the CHG.  Do not shave (including legs and underarms) for at least 48 hours prior to first CHG shower. It is OK to shave your face.  Please follow these instructions carefully.   Shower the NIGHT BEFORE SURGERY and the MORNING OF SURGERY  If you chose to wash your hair, wash your hair first as usual with your normal shampoo.  After you shampoo, rinse your hair and body thoroughly to remove the shampoo.  Use CHG Soap as you would any other liquid soap. You can apply CHG directly to the skin  and wash gently with a scrungie or a clean washcloth.   Apply the CHG Soap to your body ONLY FROM THE NECK DOWN.  Do not use on open wounds or open sores. Avoid contact with your eyes, ears, mouth and genitals (private parts). Wash Face and genitals (private parts)  with your normal soap.   Wash thoroughly, paying special attention to the area where your surgery will be performed.  Thoroughly rinse your body with warm water from the neck down.  DO NOT shower/wash with your normal soap after using and rinsing off the CHG Soap.  Pat yourself dry with a CLEAN  TOWEL.  Wear CLEAN PAJAMAS to bed the night before surgery  Place CLEAN SHEETS on your bed the night before your surgery  DO NOT SLEEP WITH PETS.   Day of Surgery: Take a shower with CHG soap. Do not wear jewelry Do not wear lotions, powders, colognes, or deodorant. Men may shave face and neck. Do not bring valuables to the hospital.  [facility] is not responsible for any belongings or valuables. Wear Clean/Comfortable clothing the morning of surgery Remember to brush your teeth WITH YOUR REGULAR TOOTHPASTE.   Please read over the following fact sheets that you were given.    If you received a COVID test during your pre-op visit  it is requested that you wear a mask when out in public, stay away from anyone that may not be feeling well and notify your surgeon if you develop symptoms. If you have been in contact with anyone that has tested positive in the last 10 days please notify you surgeon.

## 2022-03-23 ENCOUNTER — Encounter (HOSPITAL_COMMUNITY)
Admission: RE | Admit: 2022-03-23 | Discharge: 2022-03-23 | Disposition: A | Discharge status: Home / Self Care | Payer: Medicare HMO | Source: Ambulatory Visit | Attending: Internal Medicine | Admitting: Internal Medicine

## 2022-03-23 ENCOUNTER — Other Ambulatory Visit: Payer: Self-pay

## 2022-03-23 ENCOUNTER — Encounter (HOSPITAL_COMMUNITY): Payer: Self-pay

## 2022-03-23 ENCOUNTER — Ambulatory Visit (HOSPITAL_COMMUNITY)
Admission: RE | Admit: 2022-03-23 | Discharge: 2022-03-23 | Disposition: A | Discharge status: Home / Self Care | Payer: Medicare HMO | Source: Ambulatory Visit | Attending: Internal Medicine | Admitting: Internal Medicine

## 2022-03-23 VITALS — BP 135/69 | HR 62 | Temp 97.6°F | Resp 18 | Ht 69.0 in | Wt 166.5 lb

## 2022-03-23 DIAGNOSIS — Z01818 Encounter for other preprocedural examination: Secondary | ICD-10-CM | POA: Insufficient documentation

## 2022-03-23 DIAGNOSIS — Z20822 Contact with and (suspected) exposure to covid-19: Secondary | ICD-10-CM | POA: Insufficient documentation

## 2022-03-23 DIAGNOSIS — I700 Atherosclerosis of aorta: Secondary | ICD-10-CM | POA: Diagnosis not present

## 2022-03-23 DIAGNOSIS — I340 Nonrheumatic mitral (valve) insufficiency: Secondary | ICD-10-CM | POA: Insufficient documentation

## 2022-03-23 DIAGNOSIS — I517 Cardiomegaly: Secondary | ICD-10-CM | POA: Diagnosis not present

## 2022-03-23 HISTORY — DX: Hypothyroidism, unspecified: E03.9

## 2022-03-23 LAB — PROTIME-INR
INR: 1.1 (ref 0.8–1.2)
Prothrombin Time: 13.9 seconds (ref 11.4–15.2)

## 2022-03-23 LAB — TYPE AND SCREEN
ABO/RH(D): A POS
Antibody Screen: NEGATIVE

## 2022-03-23 LAB — URINALYSIS, ROUTINE W REFLEX MICROSCOPIC
Bacteria, UA: NONE SEEN
Bilirubin Urine: NEGATIVE
Glucose, UA: >=500 mg/dL — AB
Ketones, ur: NEGATIVE mg/dL
Leukocytes,Ua: NEGATIVE
Nitrite: NEGATIVE
Protein, ur: NEGATIVE mg/dL
Specific Gravity, Urine: 1.013 (ref 1.005–1.030)
pH: 6 (ref 5.0–8.0)

## 2022-03-23 LAB — CBC
HCT: 43.3 % (ref 39.0–52.0)
Hemoglobin: 13.9 g/dL (ref 13.0–17.0)
MCH: 30.7 pg (ref 26.0–34.0)
MCHC: 32.1 g/dL (ref 30.0–36.0)
MCV: 95.6 fL (ref 80.0–100.0)
Platelets: 163 10*3/uL (ref 150–400)
RBC: 4.53 MIL/uL (ref 4.22–5.81)
RDW: 16.7 % — ABNORMAL HIGH (ref 11.5–15.5)
WBC: 13.2 10*3/uL — ABNORMAL HIGH (ref 4.0–10.5)
nRBC: 0 % (ref 0.0–0.2)

## 2022-03-23 LAB — BASIC METABOLIC PANEL
BUN/Creatinine Ratio: 16 (ref 10–24)
BUN: 23 mg/dL (ref 8–27)
CO2: 22 mmol/L (ref 20–29)
Calcium: 9.2 mg/dL (ref 8.6–10.2)
Chloride: 103 mmol/L (ref 96–106)
Creatinine, Ser: 1.4 mg/dL — ABNORMAL HIGH (ref 0.76–1.27)
Glucose: 116 mg/dL — ABNORMAL HIGH (ref 70–99)
Potassium: 4.4 mmol/L (ref 3.5–5.2)
Sodium: 139 mmol/L (ref 134–144)
eGFR: 50 mL/min/{1.73_m2} — ABNORMAL LOW (ref >59)

## 2022-03-23 LAB — COMPREHENSIVE METABOLIC PANEL
ALT: 34 U/L (ref 0–44)
AST: 25 U/L (ref 15–41)
Albumin: 3.9 g/dL (ref 3.5–5.0)
Alkaline Phosphatase: 55 U/L (ref 38–126)
Anion gap: 7 (ref 5–15)
BUN: 27 mg/dL — ABNORMAL HIGH (ref 8–23)
CO2: 26 mmol/L (ref 22–32)
Calcium: 9.4 mg/dL (ref 8.9–10.3)
Chloride: 106 mmol/L (ref 98–111)
Creatinine, Ser: 1.62 mg/dL — ABNORMAL HIGH (ref 0.61–1.24)
GFR, Estimated: 42 mL/min — ABNORMAL LOW (ref >60)
Glucose, Bld: 97 mg/dL (ref 70–99)
Potassium: 4.2 mmol/L (ref 3.5–5.1)
Sodium: 139 mmol/L (ref 135–145)
Total Bilirubin: 1 mg/dL (ref 0.3–1.2)
Total Protein: 6.9 g/dL (ref 6.5–8.1)

## 2022-03-23 LAB — SURGICAL PCR SCREEN
MRSA, PCR: NEGATIVE
Staphylococcus aureus: NEGATIVE

## 2022-03-23 LAB — BRAIN NATRIURETIC PEPTIDE: B Natriuretic Peptide: 884.9 pg/mL — ABNORMAL HIGH (ref 0.0–100.0)

## 2022-03-23 NOTE — Progress Notes (Signed)
PCP - Dr. Shirline Frees Cardiologist - Dr. Minus Breeding   PPM/ICD - n/a  Chest x-ray - 03/23/22 EKG - 03/12/22 Stress Test - 2016 ECHO - 12/11/21 Cardiac Cath - 01/27/22  Sleep Study - denies CPAP - denies  Blood Thinner Instructions: Eliquis LD 03/22/22 Aspirin Instructions: denies  COVID TEST- 03/23/22, done in PAT  Anesthesia review: Yes, hx of CAD  Patient denies shortness of breath, fever, cough and chest pain at PAT appointment   All instructions explained to the patient, with a verbal understanding of the material. Patient agrees to go over the instructions while at home for a better understanding. Patient also instructed to self quarantine after being tested for COVID-19. The opportunity to ask questions was provided.

## 2022-03-24 ENCOUNTER — Ambulatory Visit: Payer: Medicare HMO | Admitting: Cardiology

## 2022-03-24 LAB — SARS CORONAVIRUS 2 (TAT 6-24 HRS): SARS Coronavirus 2: NEGATIVE

## 2022-03-24 MED ORDER — CEFAZOLIN SODIUM-DEXTROSE 2-4 GM/100ML-% IV SOLN
2.0000 g | INTRAVENOUS | Status: DC
  Filled 2022-03-24: qty 100

## 2022-03-24 MED ORDER — CEFAZOLIN SODIUM-DEXTROSE 2-4 GM/100ML-% IV SOLN
2.0000 g | INTRAVENOUS | Status: AC
  Administered 2022-03-25: 2 g via INTRAVENOUS
  Filled 2022-03-24: qty 100

## 2022-03-24 MED ORDER — PHENYLEPHRINE HCL-NACL 20-0.9 MG/250ML-% IV SOLN
30.0000 ug/min | INTRAVENOUS | Status: AC
  Administered 2022-03-25: 25 ug/min via INTRAVENOUS
  Filled 2022-03-24 (×2): qty 250

## 2022-03-24 MED ORDER — PLASMA-LYTE A IV SOLN
INTRAVENOUS | Status: DC
  Filled 2022-03-24: qty 2.5

## 2022-03-24 MED ORDER — POTASSIUM CHLORIDE 2 MEQ/ML IV SOLN
80.0000 meq | INTRAVENOUS | Status: DC
  Filled 2022-03-24: qty 40

## 2022-03-24 MED ORDER — NITROGLYCERIN IN D5W 200-5 MCG/ML-% IV SOLN
2.0000 ug/min | INTRAVENOUS | Status: DC
  Filled 2022-03-24: qty 250

## 2022-03-24 MED ORDER — TRANEXAMIC ACID (OHS) BOLUS VIA INFUSION
15.0000 mg/kg | INTRAVENOUS | Status: DC
  Filled 2022-03-24: qty 1133

## 2022-03-24 MED ORDER — TRANEXAMIC ACID (OHS) PUMP PRIME SOLUTION
2.0000 mg/kg | INTRAVENOUS | Status: DC
  Filled 2022-03-24: qty 1.51

## 2022-03-24 MED ORDER — VANCOMYCIN HCL 1250 MG/250ML IV SOLN
1250.0000 mg | INTRAVENOUS | Status: DC
  Filled 2022-03-24: qty 250

## 2022-03-24 MED ORDER — TRANEXAMIC ACID 1000 MG/10ML IV SOLN
1.5000 mg/kg/h | INTRAVENOUS | Status: DC
  Filled 2022-03-24: qty 25

## 2022-03-24 MED ORDER — DEXMEDETOMIDINE HCL IN NACL 400 MCG/100ML IV SOLN
0.1000 ug/kg/h | INTRAVENOUS | Status: DC
  Filled 2022-03-24: qty 100

## 2022-03-24 MED ORDER — HEPARIN 30,000 UNITS/1000 ML (OHS) CELLSAVER SOLUTION
Status: DC
  Filled 2022-03-24: qty 1000

## 2022-03-24 MED ORDER — MAGNESIUM SULFATE 50 % IJ SOLN
40.0000 meq | INTRAMUSCULAR | Status: DC
  Filled 2022-03-24: qty 9.85

## 2022-03-24 MED ORDER — MILRINONE LACTATE IN DEXTROSE 20-5 MG/100ML-% IV SOLN
0.3000 ug/kg/min | INTRAVENOUS | Status: DC
  Filled 2022-03-24: qty 100

## 2022-03-24 MED ORDER — EPINEPHRINE HCL 5 MG/250ML IV SOLN IN NS
0.0000 ug/min | INTRAVENOUS | Status: DC
  Filled 2022-03-24: qty 250

## 2022-03-24 MED ORDER — NOREPINEPHRINE 4 MG/250ML-% IV SOLN
0.0000 ug/min | INTRAVENOUS | Status: DC
  Filled 2022-03-24: qty 250

## 2022-03-24 MED ORDER — INSULIN REGULAR(HUMAN) IN NACL 100-0.9 UT/100ML-% IV SOLN
INTRAVENOUS | Status: DC
  Filled 2022-03-24 (×2): qty 100

## 2022-03-25 ENCOUNTER — Inpatient Hospital Stay (HOSPITAL_COMMUNITY)
Admission: RE | Admit: 2022-03-25 | Discharge: 2022-03-26 | DRG: 267 | Disposition: A | Discharge status: Home / Self Care | Payer: Medicare HMO | Source: Ambulatory Visit | Attending: Internal Medicine | Admitting: Internal Medicine

## 2022-03-25 ENCOUNTER — Encounter (HOSPITAL_COMMUNITY)
Admission: RE | Disposition: A | Discharge status: Home / Self Care | Payer: Medicare HMO | Source: Ambulatory Visit | Attending: Internal Medicine

## 2022-03-25 ENCOUNTER — Encounter (HOSPITAL_COMMUNITY): Payer: Self-pay | Admitting: Internal Medicine

## 2022-03-25 ENCOUNTER — Inpatient Hospital Stay (HOSPITAL_COMMUNITY): Payer: Medicare HMO | Admitting: Physician Assistant

## 2022-03-25 ENCOUNTER — Inpatient Hospital Stay (HOSPITAL_COMMUNITY): Payer: Medicare HMO | Admitting: Certified Registered Nurse Anesthetist

## 2022-03-25 ENCOUNTER — Other Ambulatory Visit: Payer: Self-pay | Admitting: Physician Assistant

## 2022-03-25 ENCOUNTER — Other Ambulatory Visit: Payer: Self-pay

## 2022-03-25 ENCOUNTER — Inpatient Hospital Stay (HOSPITAL_COMMUNITY): Payer: Medicare HMO

## 2022-03-25 DIAGNOSIS — Z955 Presence of coronary angioplasty implant and graft: Secondary | ICD-10-CM | POA: Diagnosis not present

## 2022-03-25 DIAGNOSIS — Z96652 Presence of left artificial knee joint: Secondary | ICD-10-CM | POA: Diagnosis not present

## 2022-03-25 DIAGNOSIS — Z6824 Body mass index (BMI) 24.0-24.9, adult: Secondary | ICD-10-CM

## 2022-03-25 DIAGNOSIS — R7303 Prediabetes: Secondary | ICD-10-CM | POA: Diagnosis present

## 2022-03-25 DIAGNOSIS — N1832 Chronic kidney disease, stage 3b: Secondary | ICD-10-CM | POA: Diagnosis not present

## 2022-03-25 DIAGNOSIS — Z8616 Personal history of COVID-19: Secondary | ICD-10-CM

## 2022-03-25 DIAGNOSIS — I340 Nonrheumatic mitral (valve) insufficiency: Secondary | ICD-10-CM | POA: Diagnosis not present

## 2022-03-25 DIAGNOSIS — Z79899 Other long term (current) drug therapy: Secondary | ICD-10-CM

## 2022-03-25 DIAGNOSIS — Z85828 Personal history of other malignant neoplasm of skin: Secondary | ICD-10-CM | POA: Diagnosis not present

## 2022-03-25 DIAGNOSIS — I5032 Chronic diastolic (congestive) heart failure: Secondary | ICD-10-CM | POA: Diagnosis present

## 2022-03-25 DIAGNOSIS — E7800 Pure hypercholesterolemia, unspecified: Secondary | ICD-10-CM | POA: Diagnosis present

## 2022-03-25 DIAGNOSIS — E663 Overweight: Secondary | ICD-10-CM | POA: Diagnosis not present

## 2022-03-25 DIAGNOSIS — I130 Hypertensive heart and chronic kidney disease with heart failure and stage 1 through stage 4 chronic kidney disease, or unspecified chronic kidney disease: Secondary | ICD-10-CM | POA: Diagnosis present

## 2022-03-25 DIAGNOSIS — Z954 Presence of other heart-valve replacement: Secondary | ICD-10-CM | POA: Diagnosis not present

## 2022-03-25 DIAGNOSIS — E785 Hyperlipidemia, unspecified: Secondary | ICD-10-CM | POA: Diagnosis present

## 2022-03-25 DIAGNOSIS — Z9889 Other specified postprocedural states: Secondary | ICD-10-CM

## 2022-03-25 DIAGNOSIS — I482 Chronic atrial fibrillation, unspecified: Secondary | ICD-10-CM | POA: Diagnosis present

## 2022-03-25 DIAGNOSIS — Z7901 Long term (current) use of anticoagulants: Secondary | ICD-10-CM

## 2022-03-25 DIAGNOSIS — E039 Hypothyroidism, unspecified: Secondary | ICD-10-CM | POA: Diagnosis present

## 2022-03-25 DIAGNOSIS — Z006 Encounter for examination for normal comparison and control in clinical research program: Secondary | ICD-10-CM

## 2022-03-25 DIAGNOSIS — I2510 Atherosclerotic heart disease of native coronary artery without angina pectoris: Secondary | ICD-10-CM | POA: Diagnosis present

## 2022-03-25 DIAGNOSIS — I088 Other rheumatic multiple valve diseases: Secondary | ICD-10-CM | POA: Diagnosis not present

## 2022-03-25 DIAGNOSIS — I3139 Other pericardial effusion (noninflammatory): Secondary | ICD-10-CM | POA: Diagnosis not present

## 2022-03-25 DIAGNOSIS — I4819 Other persistent atrial fibrillation: Secondary | ICD-10-CM | POA: Diagnosis present

## 2022-03-25 DIAGNOSIS — Z96611 Presence of right artificial shoulder joint: Secondary | ICD-10-CM | POA: Diagnosis not present

## 2022-03-25 DIAGNOSIS — Z79890 Hormone replacement therapy (postmenopausal): Secondary | ICD-10-CM | POA: Diagnosis not present

## 2022-03-25 DIAGNOSIS — I10 Essential (primary) hypertension: Secondary | ICD-10-CM | POA: Diagnosis not present

## 2022-03-25 DIAGNOSIS — Z981 Arthrodesis status: Secondary | ICD-10-CM | POA: Diagnosis not present

## 2022-03-25 DIAGNOSIS — K219 Gastro-esophageal reflux disease without esophagitis: Secondary | ICD-10-CM | POA: Diagnosis not present

## 2022-03-25 DIAGNOSIS — Z87891 Personal history of nicotine dependence: Secondary | ICD-10-CM

## 2022-03-25 DIAGNOSIS — I252 Old myocardial infarction: Secondary | ICD-10-CM | POA: Diagnosis not present

## 2022-03-25 DIAGNOSIS — Q211 Atrial septal defect, unspecified: Secondary | ICD-10-CM | POA: Diagnosis not present

## 2022-03-25 DIAGNOSIS — Z8249 Family history of ischemic heart disease and other diseases of the circulatory system: Secondary | ICD-10-CM

## 2022-03-25 HISTORY — PX: TEE WITHOUT CARDIOVERSION: SHX5443

## 2022-03-25 HISTORY — DX: Other specified postprocedural states: Z98.890

## 2022-03-25 HISTORY — PX: TRANSCATHETER MITRAL EDGE TO EDGE REPAIR: CATH118311

## 2022-03-25 HISTORY — DX: Nonrheumatic mitral (valve) insufficiency: I34.0

## 2022-03-25 HISTORY — PX: MITRAL VALVE REPAIR: CATH118311

## 2022-03-25 LAB — POCT ACTIVATED CLOTTING TIME
Activated Clotting Time: 239 seconds
Activated Clotting Time: 275 seconds
Activated Clotting Time: 287 seconds
Activated Clotting Time: 305 seconds

## 2022-03-25 LAB — ECHO TEE
MV M vel: 4.04 m/s
MV Peak grad: 65.3 mmHg
Radius: 0.7 cm

## 2022-03-25 SURGERY — MITRAL VALVE REPAIR
Anesthesia: General

## 2022-03-25 MED ORDER — PANTOPRAZOLE SODIUM 40 MG PO TBEC
40.0000 mg | DELAYED_RELEASE_TABLET | Freq: Every day | ORAL | Status: DC
  Administered 2022-03-26: 40 mg via ORAL
  Filled 2022-03-25: qty 1

## 2022-03-25 MED ORDER — SODIUM CHLORIDE 0.9 % IV SOLN: 250.0000 mL | INTRAVENOUS | Status: DC | PRN

## 2022-03-25 MED ORDER — APIXABAN 5 MG PO TABS
5.0000 mg | ORAL_TABLET | Freq: Two times a day (BID) | ORAL | Status: DC
  Administered 2022-03-25 – 2022-03-26 (×2): 5 mg via ORAL
  Filled 2022-03-25 (×2): qty 1

## 2022-03-25 MED ORDER — SUGAMMADEX SODIUM 200 MG/2ML IV SOLN
INTRAVENOUS | Status: DC | PRN
  Administered 2022-03-25: 200 mg via INTRAVENOUS

## 2022-03-25 MED ORDER — TRAMADOL HCL 50 MG PO TABS
50.0000 mg | ORAL_TABLET | Freq: Four times a day (QID) | ORAL | Status: DC | PRN
  Administered 2022-03-25: 50 mg via ORAL
  Filled 2022-03-25: qty 1

## 2022-03-25 MED ORDER — LACTATED RINGERS IV SOLN: INTRAVENOUS | Status: DC | PRN

## 2022-03-25 MED ORDER — HEPARIN (PORCINE) IN NACL 2000-0.9 UNIT/L-% IV SOLN
INTRAVENOUS | Status: AC
  Filled 2022-03-25: qty 1000

## 2022-03-25 MED ORDER — PROPOFOL 10 MG/ML IV BOLUS
INTRAVENOUS | Status: DC | PRN
  Administered 2022-03-25: 130 mg via INTRAVENOUS
  Administered 2022-03-25 (×3): 20 mg via INTRAVENOUS

## 2022-03-25 MED ORDER — DAPAGLIFLOZIN PROPANEDIOL 10 MG PO TABS
10.0000 mg | ORAL_TABLET | Freq: Every day | ORAL | Status: DC
  Administered 2022-03-26: 10 mg via ORAL
  Filled 2022-03-25: qty 1

## 2022-03-25 MED ORDER — HEPARIN SODIUM (PORCINE) 1000 UNIT/ML IJ SOLN
INTRAMUSCULAR | Status: DC | PRN
  Administered 2022-03-25: 2000 [IU] via INTRAVENOUS
  Administered 2022-03-25: 6000 [IU] via INTRAVENOUS
  Administered 2022-03-25: 4000 [IU] via INTRAVENOUS
  Administered 2022-03-25: 2000 [IU] via INTRAVENOUS
  Administered 2022-03-25: 3000 [IU] via INTRAVENOUS

## 2022-03-25 MED ORDER — SODIUM CHLORIDE 0.9 % IV SOLN: INTRAVENOUS | Status: DC

## 2022-03-25 MED ORDER — HEPARIN (PORCINE) IN NACL 2000-0.9 UNIT/L-% IV SOLN
INTRAVENOUS | Status: DC | PRN
  Administered 2022-03-25 (×3): 1000 mL

## 2022-03-25 MED ORDER — PHENYLEPHRINE HCL (PRESSORS) 10 MG/ML IV SOLN
INTRAVENOUS | Status: DC | PRN
  Administered 2022-03-25 (×3): 80 ug via INTRAVENOUS

## 2022-03-25 MED ORDER — ONDANSETRON HCL 4 MG/2ML IJ SOLN
INTRAMUSCULAR | Status: DC | PRN
  Administered 2022-03-25: 4 mg via INTRAVENOUS

## 2022-03-25 MED ORDER — CHLORHEXIDINE GLUCONATE 4 % EX LIQD: 60.0000 mL | Freq: Once | CUTANEOUS | Status: DC

## 2022-03-25 MED ORDER — CLOPIDOGREL BISULFATE 75 MG PO TABS
75.0000 mg | ORAL_TABLET | Freq: Every day | ORAL | Status: DC
  Administered 2022-03-26: 75 mg via ORAL
  Filled 2022-03-25: qty 1

## 2022-03-25 MED ORDER — FENTANYL CITRATE (PF) 250 MCG/5ML IJ SOLN
INTRAMUSCULAR | Status: DC | PRN
  Administered 2022-03-25: 100 ug via INTRAVENOUS

## 2022-03-25 MED ORDER — FUROSEMIDE 20 MG PO TABS: 20.0000 mg | ORAL_TABLET | Freq: Every day | ORAL | Status: DC

## 2022-03-25 MED ORDER — CHLORHEXIDINE GLUCONATE 4 % EX LIQD: 30.0000 mL | CUTANEOUS | Status: DC

## 2022-03-25 MED ORDER — ACETAMINOPHEN 325 MG PO TABS: 650.0000 mg | ORAL_TABLET | ORAL | Status: DC | PRN

## 2022-03-25 MED ORDER — METOPROLOL TARTRATE 25 MG PO TABS
25.0000 mg | ORAL_TABLET | Freq: Two times a day (BID) | ORAL | Status: DC
  Administered 2022-03-25 – 2022-03-26 (×2): 25 mg via ORAL
  Filled 2022-03-25 (×2): qty 1

## 2022-03-25 MED ORDER — ROCURONIUM BROMIDE 10 MG/ML (PF) SYRINGE
PREFILLED_SYRINGE | INTRAVENOUS | Status: DC | PRN
  Administered 2022-03-25: 20 mg via INTRAVENOUS
  Administered 2022-03-25 (×2): 10 mg via INTRAVENOUS
  Administered 2022-03-25: 70 mg via INTRAVENOUS

## 2022-03-25 MED ORDER — DOCUSATE SODIUM 100 MG PO CAPS: 100.0000 mg | ORAL_CAPSULE | Freq: Every day | ORAL | Status: DC | PRN

## 2022-03-25 MED ORDER — ATORVASTATIN CALCIUM 80 MG PO TABS
80.0000 mg | ORAL_TABLET | Freq: Every day | ORAL | Status: DC
  Administered 2022-03-25: 80 mg via ORAL
  Filled 2022-03-25: qty 1

## 2022-03-25 MED ORDER — HEPARIN (PORCINE) IN NACL 1000-0.9 UT/500ML-% IV SOLN
INTRAVENOUS | Status: DC | PRN
  Administered 2022-03-25: 500 mL

## 2022-03-25 MED ORDER — SODIUM CHLORIDE 0.9% FLUSH: 3.0000 mL | INTRAVENOUS | Status: DC | PRN

## 2022-03-25 MED ORDER — EPHEDRINE SULFATE (PRESSORS) 50 MG/ML IJ SOLN
INTRAMUSCULAR | Status: DC | PRN
  Administered 2022-03-25 (×2): 2.5 mg via INTRAVENOUS
  Administered 2022-03-25: 5 mg via INTRAVENOUS

## 2022-03-25 MED ORDER — ONDANSETRON HCL 4 MG/2ML IJ SOLN: 4.0000 mg | Freq: Four times a day (QID) | INTRAMUSCULAR | Status: DC | PRN

## 2022-03-25 MED ORDER — CEFAZOLIN SODIUM-DEXTROSE 2-4 GM/100ML-% IV SOLN: 2.0000 g | INTRAVENOUS | Status: DC

## 2022-03-25 MED ORDER — HEPARIN (PORCINE) IN NACL 1000-0.9 UT/500ML-% IV SOLN
INTRAVENOUS | Status: AC
  Filled 2022-03-25: qty 500

## 2022-03-25 MED ORDER — CEFAZOLIN SODIUM-DEXTROSE 2-4 GM/100ML-% IV SOLN
INTRAVENOUS | Status: AC
  Filled 2022-03-25: qty 100

## 2022-03-25 MED ORDER — SODIUM CHLORIDE 0.9% FLUSH
3.0000 mL | Freq: Two times a day (BID) | INTRAVENOUS | Status: DC
  Administered 2022-03-25 (×2): 3 mL via INTRAVENOUS

## 2022-03-25 MED ORDER — LIDOCAINE 2% (20 MG/ML) 5 ML SYRINGE
INTRAMUSCULAR | Status: DC | PRN
  Administered 2022-03-25: 60 mg via INTRAVENOUS

## 2022-03-25 MED ORDER — HYDRALAZINE HCL 20 MG/ML IJ SOLN: 5.0000 mg | INTRAMUSCULAR | Status: DC | PRN

## 2022-03-25 MED ORDER — LABETALOL HCL 5 MG/ML IV SOLN: 10.0000 mg | INTRAVENOUS | Status: DC | PRN

## 2022-03-25 MED ORDER — CHLORHEXIDINE GLUCONATE 0.12 % MT SOLN
15.0000 mL | Freq: Once | OROMUCOSAL | Status: AC
  Administered 2022-03-25: 15 mL via OROMUCOSAL
  Filled 2022-03-25: qty 15

## 2022-03-25 SURGICAL SUPPLY — 17 items
CATH MITRA STEERABLE GUIDE (CATHETERS) IMPLANT
CLIP MITRA G4 DELIVERY SYS XTW (Clip) IMPLANT
CLOSURE PERCLOSE PROSTYLE (VASCULAR PRODUCTS) IMPLANT
KIT HEART LEFT (KITS) ×2 IMPLANT
KIT VERSACROSS LRG ACCESS (CATHETERS) IMPLANT
PACK CARDIAC CATHETERIZATION (CUSTOM PROCEDURE TRAY) ×1 IMPLANT
SHEATH DILAT COONS TAPER 22F (SHEATH) IMPLANT
SHEATH PINNACLE 8F 10CM (SHEATH) IMPLANT
SHEATH PROBE COVER 6X72 (BAG) ×1 IMPLANT
SHIELD RADPAD SCOOP 12X17 (MISCELLANEOUS) IMPLANT
STOPCOCK MORSE 400PSI 3WAY (MISCELLANEOUS) ×6 IMPLANT
SYSTEM MITRACLIP G4 (SYSTAGENIX WOUND MANAGEMENT) IMPLANT
TRANSDUCER W/STOPCOCK (MISCELLANEOUS) ×1 IMPLANT
TUBING ART PRESS 72  MALE/FEM (TUBING) ×1
TUBING ART PRESS 72 MALE/FEM (TUBING) ×1 IMPLANT
WIRE EMERALD 3MM-J .035X150CM (WIRE) IMPLANT
WIRE MICRO SET SILHO 5FR 7 (SHEATH) IMPLANT

## 2022-03-25 NOTE — H&P (Signed)
Cardiology Admission History and Physical:   Patient ID: [name] MRN: [id number]; DOB: [birth year]   Admission date: 03/25/2022  PCP:  Shirline Frees, MD   [facility] Providers Cardiologist:  Minus Breeding, MD        Chief Complaint: Dyspnea   History of Present Illness:   [name] is an 86 year old male with a history of coronary artery disease status post remote PCI, hypertension, hyperlipidemia, persistent atrial fibrillation on apixaban, and severe symptomatic mitral regurgitation who was evaluated in the outpatient setting.  He endorses [place] heart association class II-III symptoms of shortness of breath.  After TEE examination he is referred for mitral transcatheter edge-to-edge repair due to severe symptomatic mitral regurgitation.   Past Medical History:  Diagnosis Date   Arthritis    knees,back   CAD (coronary artery disease) CARDIOLOGIST-  DR HOCHREIN   previous myocardial infarction in 2001 with 90% circumflex lesion treated with PTCA. The LAD had 70-80% stenosis which was treawted with angioplasty. Most recent catheterization in 2003 demonstrated the LAD at 30% mid stenosis, circumflex 60-70% stenosis in the mid AV grove, and 40% stenosis in the previously stented area. The right coronary artery had 30-40% stenosis in the mid portion.    Dyspnea    GERD (gastroesophageal reflux disease)    Heart murmur    History of kidney stones    Hypertension    Hypothyroidism    Left ureteral stone    Myocardial infarction [facility])    Pre-diabetes    Skin cancer    face    Past Surgical History:  Procedure Laterality Date   APPENDECTOMY  as teen   BUBBLE STUDY  12/11/2021   Procedure: BUBBLE STUDY;  Surgeon: Elouise Munroe, MD;  Location: [facility] ENDOSCOPY;  Service: Cardiology;;   CARDIAC CATHETERIZATION  03-29-2000   dr hochrein   Nonobstuctive CAD /   CFX patent stent and LAD diffuse luminal irregularities including mid segment previously been angioplastied    CARDIOVASCULAR STRESS TEST  05-09-2015   dr hochrein   low risk perfusion study/  no reversible ischemia/  normal LV function and wall motion , ef 63%   CATARACT EXTRACTION W/ INTRAOCULAR LENS  IMPLANT, BILATERAL  left 05-06-2015/  right 06-10-2015   CORONARY ANGIOPLASTY WITH STENT PLACEMENT  01-21-2000  dr hochrein   PTCA to pLAD/  PTCA and BMS x1 to mid LCFX/  normal LVSF   CORONARY ANGIOPLASTY WITH STENT PLACEMENT  01-10-2002  dr Lyndel Safe   PTCA and BMS to mid AV LCFX (in-stent restenosis)/  diffuse 30-40% mRCA ,  mLAD diffuse 30%,  normal LVSF, ef 65%   CYSTOSCOPY/RETROGRADE/URETEROSCOPY/STONE EXTRACTION WITH BASKET Left 05/23/2015   Procedure: CYSTOSCOPY, FULGERATION OF PROSTATE,  LEFT URETEROSCOPY, JJ STENT PLACEMENT;  Surgeon: Festus Aloe, MD;  Location: [facility];  Service: Urology;  Laterality: Left;   ELECTROPHYSIOLOGIC STUDY  03-30-2000   dr Caryl Comes   EXTRACORPOREAL SHOCK WAVE LITHOTRIPSY Left 10-30-2012   EXTRACORPOREAL SHOCK WAVE LITHOTRIPSY Right 11/16/2021   Procedure: EXTRACORPOREAL SHOCK WAVE LITHOTRIPSY (ESWL);  Surgeon: Franchot Gallo, MD;  Location: [facility];  Service: Urology;  Laterality: Right;   HOLMIUM LASER APPLICATION Left 78/24/2353   Procedure: WITH HOLMIUM LASER LITHOTRIPSY;  Surgeon: Festus Aloe, MD;  Location: [facility];  Service: Urology;  Laterality: Left;   KNEE ARTHROSCOPY Bilateral x2 right/  left 2015   LUMBAR LAMINECTOMY/DECOMPRESSION MICRODISCECTOMY N/A 09/28/2012   Procedure: L3  -L5 DECOMPRESSION L4 - L5 FUSION WITH  INSTRUMENTATION 2 LEVELS;  Surgeon: Melina Schools, MD;  Location: [place];  Service: Orthopedics;  Laterality: N/A;   RIGHT/LEFT HEART CATH AND CORONARY ANGIOGRAPHY N/A 01/27/2022   Procedure: RIGHT/LEFT HEART CATH AND CORONARY ANGIOGRAPHY;  Surgeon: Early Osmond, MD;  Location: [place] [facility];  Service: Cardiovascular;  Laterality: N/A;   TEE WITHOUT CARDIOVERSION N/A  12/11/2021   Procedure: TRANSESOPHAGEAL ECHOCARDIOGRAM (TEE);  Surgeon: Elouise Munroe, MD;  Location: [place];  Service: Cardiology;  Laterality: N/A;   TONSILLECTOMY  as child   TOTAL KNEE ARTHROPLASTY Left 10/02/2018   Procedure: TOTAL LEFT  KNEE ARTHROPLASTY;  Surgeon: Paralee Cancel, MD;  Location: [facility];  Service: Orthopedics;  Laterality: Left;  90 mins- Ok per Tiffany   TOTAL SHOULDER ARTHROPLASTY Right 01/14/2011   TRANSTHORACIC ECHOCARDIOGRAM  05-21-2015   mild LVH,  ef 55-605,  grade 2 diastolic dysfunction, basal inferior hyperkinesis/  moderate AV calcification sclerosis without stenosis/  trivial MR/  moderate LAE/  mild TR/  mild RAE     Medications Prior to Admission: Prior to Admission medications   Medication Sig Start Date End Date Taking? Authorizing Provider  acetaminophen (TYLENOL) 500 MG tablet Take 500 mg by mouth every 6 (six) hours as needed for moderate pain.   Yes [provider]  apixaban (ELIQUIS) 5 MG TABS tablet Take 1 tablet (5 mg total) by mouth 2 (two) times daily. 11/05/21  Yes Burnell Blanks, MD  atorvastatin (LIPITOR) 80 MG tablet Take 1 tablet (80 mg total) by mouth at bedtime. 11/26/13  Yes Minus Breeding, MD  Cholecalciferol (VITAMIN D) 50 MCG (2000 UT) tablet Take 2,000 Units by mouth daily.   Yes [provider]  Cyanocobalamin (B-12) 5000 MCG CAPS Take 5,000 mcg by mouth daily.   Yes [provider]  dapagliflozin propanediol (FARXIGA) 10 MG TABS tablet Take 1 tablet (10 mg total) by mouth daily before breakfast. 03/12/22  Yes Larey Dresser, MD  diclofenac Sodium (VOLTAREN) 1 % GEL Apply 1 Application topically 4 (four) times daily as needed (pain).   Yes [provider]  docusate sodium (COLACE) 100 MG capsule Take 1 capsule (100 mg total) by mouth 2 (two) times daily. Patient taking differently: Take 100 mg by mouth daily as needed for mild constipation or moderate constipation. 10/02/18  Yes Babish,  Rodman Key, PA-C  esomeprazole (NEXIUM) 40 MG capsule Take 40 mg by mouth daily.   Yes [provider]  ferrous sulfate (FERROUSUL) 325 (65 FE) MG tablet Take 1 tablet (325 mg total) by mouth 3 (three) times daily with meals. Patient taking differently: Take 325 mg by mouth daily with breakfast. 10/02/18  Yes Babish, Rodman Key, PA-C  folic acid (FOLVITE) 389 MCG tablet Take 800 mcg by mouth daily.    Yes [provider]  furosemide (LASIX) 20 MG tablet Take 1 tablet (20 mg total) by mouth daily. Take 20 mg daily for 3 days only Patient taking differently: Take 20 mg by mouth daily. 12/22/21  Yes Minus Breeding, MD  levothyroxine (SYNTHROID) 25 MCG tablet Take 25 mcg by mouth daily before breakfast.   Yes [provider]  metoprolol tartrate (LOPRESSOR) 25 MG tablet Take 1 tablet (25 mg total) by mouth 2 (two) times daily. 11/26/13  Yes Minus Breeding, MD  niacin 500 MG tablet Take 500 mg by mouth daily.   Yes [provider]  nitroGLYCERIN (NITROSTAT) 0.4 MG SL tablet Place 0.4 mg under the tongue every 5 (five) minutes as needed  for chest pain.   Yes [provider]  predniSONE (DELTASONE) 10 MG tablet Take 10 mg by mouth See admin instructions. TAKE ONE TABLET BY MOUTH THREE (3) TIMES A DAY X2 DAYS, THEN TAKE ONE TABLET BY MOUTH TWO (2) TIMES A DAY X5 DAYS, THEN TAKE ONE TABLET BY MOUTH DAILY UNTIL FINISHED 03/11/22  Yes [provider]     Allergies:   No Known Allergies  Social History:   Social History   Socioeconomic History   Marital status: Married    Spouse name: Not on file   Number of children: 2   Years of education: Not on file   Highest education level: Not on file  Occupational History   Occupation: retired  Tobacco Use   Smoking status: Former    Packs/day: 1.00    Years: 3.00    Total pack years: 3.00    Types: Cigarettes    Quit date: 09/26/1962    Years since quitting: 59.5   Smokeless tobacco: Never  Vaping Use    Vaping Use: Never used  Substance and Sexual Activity   Alcohol use: No   Drug use: No   Sexual activity: Not Currently  Other Topics Concern   Not on file  Social History Narrative   Lives at home with wife.  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     Social Determinants of Health   Financial Resource Strain: Not on file  Food Insecurity: Not on file  Transportation Needs: Not on file  Physical Activity: Not on file  Stress: Not on file  Social Connections: Not on file  Intimate Partner Violence: Not on file    Family History:   The patient's family history includes Heart failure in his mother; Prostate cancer in his father.    ROS:  Please see the history of present illness.  All other ROS reviewed and negative.     Physical Exam/Data:   Vitals:   03/25/22 0615  BP: 127/67  Pulse: 77  Resp: 17  Temp: 97.6 F (36.4 C)  SpO2: 96%  Weight: 74.8 kg  Height: '5\' 9"'$  (1.753 m)   No intake or output data in the 24 hours ending 03/25/22 0729    03/25/2022    6:15 AM 03/23/2022    1:02 PM 03/12/2022   11:36 AM  Last 3 Weights  Weight (lbs) 165 lb 166 lb 8 oz 171 lb  Weight (kg) 74.844 kg 75.524 kg 77.565 kg     Body mass index is 24.37 kg/m.  General:  Well nourished, well developed, in no acute distress HEENT: normal Neck: no JVD Vascular: No carotid bruits; Distal pulses 2+ bilaterally   Cardiac:  normal S1, S2; regular rate and rhythm with 3 out of 6 holosystolic murmur Lungs:  clear to auscultation bilaterally, no wheezing, rhonchi or rales  Abd: soft, nontender, no hepatomegaly  Ext: no edema Musculoskeletal:  No deformities, BUE and BLE strength normal and equal Skin: warm and dry  Neuro:  CNs 2-12 intact, no focal abnormalities noted Psych:  Normal affect    EKG:  The ECG that was done March 12, 2022 was personally reviewed and demonstrates atrial fibrillation  Relevant CV Studies: 2D ECHO: April 2023 demonstrates an ejection fraction of 55 to 60%, normal right  ventricular function, severely dilated left atrium, trivial pericardial effusion, and severe mitral regurgitation   TEE: May 2023 demonstrates an ejection fraction of 60 to 65% with mildly reduced RV function and mild RV dilatation.  Severe  mitral regurgitation is seen and there was one predominant central jet with another lateral jet and a more trivial medial jet.  The mean gradient across the valve is 1 mmHg with a valve area of close to 7 cm with a long posterior leaflet of 1.64 mm  Coronary angiography and right heart catheterization 2023: 1.  Minimal obstructive coronary artery disease with patent left circumflex stents. 2.  Cardiac output of 6.6 L/min and index of 3.5 L/min/m with mean RA pressure of 5, mean wedge pressure of 17 with V waves to 34 mmHg consistent with severe mitral regurgitation.  Laboratory Data:  High Sensitivity Troponin:  No results for input(s): "TROPONINIHS" in the last 720 hours.    Chemistry Recent Labs  Lab 03/22/22 1456 03/23/22 1400  NA 139 139  K 4.4 4.2  CL 103 106  CO2 22 26  GLUCOSE 116* 97  BUN 23 27*  CREATININE 1.40* 1.62*  CALCIUM 9.2 9.4  GFRNONAA  --  42*  ANIONGAP  --  7    Recent Labs  Lab 03/23/22 1400  PROT 6.9  ALBUMIN 3.9  AST 25  ALT 34  ALKPHOS 55  BILITOT 1.0   Lipids No results for input(s): "CHOL", "TRIG", "HDL", "LABVLDL", "LDLCALC", "CHOLHDL" in the last 168 hours. Hematology Recent Labs  Lab 03/23/22 1400  WBC 13.2*  RBC 4.53  HGB 13.9  HCT 43.3  MCV 95.6  MCH 30.7  MCHC 32.1  RDW 16.7*  PLT 163   Thyroid No results for input(s): "TSH", "FREET4" in the last 168 hours. BNP Recent Labs  Lab 03/23/22 1329  BNP 884.9*    DDimer No results for input(s): "DDIMER" in the last 168 hours.   Radiology/Studies:  No results found.   Assessment and Plan:   Severe symptomatic mitral regurgitation with [place] heart association class II-III symptoms of shortness of breath: The patient will be referred  for mitral transcatheter edge-to-edge repair today. Atrial fibrillation: We will plan on resuming apixaban and adding Plavix following the procedure.   Risk Assessment/Risk Scores:       [place] Heart Association (NYHA) Functional Class NYHA Class III  CHA2DS2-VASc Score = 4   This indicates a 4.8% annual risk of stroke. The patient's score is based upon: CHF History: 1 HTN History: 1 Diabetes History: 0 Stroke History: 0 Vascular Disease History: 0 Age Score: 2 Gender Score: 0      Severity of Illness: The appropriate patient status for this patient is INPATIENT. Inpatient status is judged to be reasonable and necessary in order to provide the required intensity of service to ensure the patient's safety. The patient's presenting symptoms, physical exam findings, and initial radiographic and laboratory data in the context of their chronic comorbidities is felt to place them at high risk for further clinical deterioration. Furthermore, it is not anticipated that the patient will be medically stable for discharge from the hospital within 2 midnights of admission.   * I certify that at the point of admission it is my clinical judgment that the patient will require inpatient hospital care spanning beyond 2 midnights from the point of admission due to high intensity of service, high risk for further deterioration and high frequency of surveillance required.*   For questions or updates, please contact [place] Please consult [email or web address] for contact info under     Signed, Early Osmond, MD  03/25/2022 7:29 AM

## 2022-03-25 NOTE — Progress Notes (Signed)
  HEART [facility] VASCULAR CENTER   MULTIDISCIPLINARY HEART VALVE TEAM  Patient doing well s/p mTEER. He is hemodynamically stable. Groin site stable. Plan for early ambulation after bedrest completed and hopeful discharge over the next 24-48 hours.   Angelena Form PA-C  [facility]  Pager [phone]

## 2022-03-25 NOTE — Discharge Instructions (Signed)
Home Care Following Your MitraClip Procedure      If you have any questions or concerns you can call the structural heart office at [phone] during normal business hours 8am-4pm. If you have an urgent need after hours or on the weekend, please call [phone] to talk to the on call provider for general cardiology. If you have an emergency that requires immediate attention, please call 911.   Groin Site Care Refer to this sheet in the next few weeks. These instructions provide you with information on caring for yourself after your procedure. Your caregiver may also give you more specific instructions. Your treatment has been planned according to current medical practices, but problems sometimes occur. Call your caregiver if you have any problems or questions after your procedure. HOME CARE INSTRUCTIONS You may shower 24 hours after the procedure. Remove the bandage (dressing) and gently wash the site with plain soap and water. Gently pat the site dry.  Do not apply powder or lotion to the site.  Do not sit in a bathtub, swimming pool, or whirlpool for 5 to 7 days.  No bending, squatting, or lifting anything over 10 pounds (4.5 kg) as directed by your caregiver.  Inspect the site at least twice daily.  Do not drive home if you are discharged the same day of the procedure. Have someone else drive you.  You may drive 72 hours after the procedure unless otherwise instructed by your caregiver.  What to expect: Any bruising will usually fade within 1 to 2 weeks.  Blood that collects in the tissue (hematoma) may be painful to the touch. It should usually decrease in size and tenderness within 1 to 2 weeks.  SEEK IMMEDIATE MEDICAL CARE IF: You have unusual pain at the groin site or down the affected leg.  You have redness, warmth, swelling, or pain at the groin site.  You have drainage (other than a small amount of blood on the dressing).  You have chills.  You have a fever or persistent  symptoms for more than 72 hours.  You have a fever and your symptoms suddenly get worse.  Your leg becomes pale, cool, tingly, or numb.  You have bleeding from the site. Hold pressure on the site until it subsides.    After MitraClip Checklist  Check  Test Description   Follow up appointment in 1-2 weeks  Most of our patients will see our structural heart APP or your primary cardiologist within 1-2 weeks. Your incision site will be checked and you will be cleared to resume all normal activities if you are doing well.     1 month echo and follow up  You will have an echo to check on your heart valve clip and be seen back in the office by our structural heart APP   Follow up with your primary cardiologist You will need to be seen by your primary cardiologist in the following 3-6 months after your 1 month appointment in the valve clinic. Often times your blood thinners will be changed. This is decided on a case by case basis.    1 year echo and follow up You will have another echo to check on your heart valve after one year and be seen back in the office by our structural heart APP. This your last structural heart visit.   Bacterial endocarditis prophylaxis  You will have to take antibiotics for the rest of your life before all dental procedures (even dental cleanings) to protect your heart valve  from potential infection. Antibiotics are also required before some surgeries. Please check with your cardiologist before scheduling any surgeries. Also, please make sure to tell us if you have a penicillin allergy as you will require an alternative antibiotic.    ______________  Your Implant Identification Card Following your procedure, you will receive an Implant Identification Card, which your doctor will fill out and which you must carry with you at all times. Show your Implant Identification Card if you report to an emergency room. This card identifies you as a patient who has had a MitraClip device  implanted. If you require a magnetic resonance imaging (MRI) scan, tell your doctor or MRI technician that you have a MitraClip device implanted. Test results indicate that patients with the MitraClip device can safely undergo MRI scans under certain conditions described on the card.

## 2022-03-25 NOTE — TOC Progression Note (Signed)
Transition of Care [facility]) - Progression Note    Patient Details  Name: [name] MRN: [id number] Date of Birth: 04/28/1936  Transition of Care [facility]) CM/SW [place], RN Phone Number:[phone]  03/25/2022, 3:15 PM  Clinical Narrative:     Transition of Care [facility]) Screening Note   Patient Details  Name: [name] Date of Birth: 18-Oct-1935   Transition of Care [facility]) CM/SW Contact:    Angelita Ingles, RN Phone Number: 03/25/2022, 3:16 PM    Transition of Care Department [facility]) has reviewed patient and no TOC needs have been identified at this time. We will continue to monitor patient advancement through interdisciplinary progression rounds.           Expected Discharge Plan and Services                                                 Social Determinants of Health (SDOH) Interventions    Readmission Risk Interventions     No data to display

## 2022-03-25 NOTE — Op Note (Signed)
[facility]   MULTIDISCIPLINARY HEART TEAM  Date of Procedure:  03/25/2022  Preoperative Diagnosis: Severe Symptomatic Mitral Regurgitation (Stage D)  Postoperative Diagnosis: Same   Procedure Performed: Ultrasound-guided right transfemoral venous access Double PreClose right femoral vein Transseptal puncture using Bailess RF needle Mitral valve repair with MitraClip XTW x 2  Surgeon: Lenna Sciara, MD  Co-Surgeon: Sherren Mocha, MD  Echocardiographer: Sanda Klein, MD  Anesthesiologist: Albertha Ghee, MD  Device Implant: Mitraclip XTW x2, first clip positioned medial A2/P2, second clip positioned lateral A2/P2  Procedural Indication: Severe Non-rheumatic Mitral Regurgitation (Stage D)   Brief History: 86 year old gentleman with persistent atrial fibrillation anticoagulated with apixaban who has developed severe symptomatic mitral regurgitation with NYHA functional class III symptoms of fatigue and exertional dyspnea.  He is felt to have Carpentier type I dysfunction with functional anatomy of the valve amenable to transcatheter edge-to-edge repair.  After multidisciplinary heart team review of his case, he is referred for TEER of the mitral valve.  Echo Findings: Preop:  Normal LV systolic function Severe MR secondary to annular dilatation, Grade 4+ Post-op:  Unchanged LV systolic function 1+ residual MR  Procedural Details: Prep The patient is brought to the cardiac catheterization lab in the fasting state. General anesthesia is induced. The patient is prepped from the groin to chin. Hemodynamics are monitored via a radial artery line.   Venous Access Using ultrasound guidance, the right femoral vein is punctured. Ultrasound images are captured and stored in the patient's chart. The vein is dilated and 2 Perclose devices are deplyed at 10' and 2' positions to 'Preclose' the femoral vein. An 8 Fr sheath is inserted.  Transseptal Puncture A Baylis  Versacross wire is advanced into the SVC A Baylis transseptal dilator is advanced into the SVC, and the VersaCross RF wire is retracted into the dilator  The transseptal dilator is retracted into the RA under fluoroscopic and echo guidance to obtain position on the posterior fossa where echo measurements are made to assure appropriate access to the mitral valve. Once proper position is confirmed by echo, RF energy is delivered and the VersaCross wire is advanced into the LA without resistance. The dilator and sheath are advanced over the wire where proper position is confirmed by echo and pressure measurement Weight based IV heparin is administered and a therapeutic ACT > 250 is confirmed  Steerable Guide Catheter Insertion The VersaCross wire is positioned in the left atrium The femoral vein is progressively dilated and the 24 Fr Steerable guide catheter is inserted and then directed across the interatrial septum over the wire. Position is confirmed approximately 3 cm into the left atrium The guide is de-aired   MitraClip Insertion The MitraClip XTW is prepped per protocol and inserted via the introducer into the steerable guide catheter The Clip Delivery System (CDS) is advanced under fluoro and echo guidance so that the sleeve markers are evenly spaced on each side of the guide marker  MitraClip Positioning in the Left Atrium (Supravalvular Alignment) M-knob is applied to bring the Clip towards the mitral valve. Echo guidance is used to avoid contact with LA structures. The Clip arms are opened to 180 degrees 2D and 3D TEE imaging is performed in multiple planes and the Clip is positioned and aligned above the valve using standard steering techniques.  Trajectory is checked in multiple planes.  Entry into the Left Ventricle and Mitral Valve Leaflet Grasp The Clip is advanced across the mitral valve into the LV, maintaining proper orientation  The Clip arms are opened to 120 degrees and the  Clip is slowly retracted  Capture of both the anterior and posterior leaflets are visualized by echo and the grippers are dropped.  Initially, the clip is deployed over the center of A2/P2.  There is good leaflet capture with appropriate orientation of the clip.  However, there is moderate residual MR both medial and lateral to the clip and it was felt that this patient's anatomy would be best treated with repositioning of the clip more medially followed by a second clip positioned more laterally.  The clip is released, everted, and brought back up into the left atrium.  It is repositioned over the medial aspect of A2/P2, advanced back across the mitral valve.  Using the same technique, leaflet capture is confirmed, the clip is closed, and there is good reduction with near obliteration of the medial MR jet.  MitraClip Deployment After extensive echo evaluation, reduction in mitral regurgitation is felt to be adequate Following standard protocol, the lock line is removed after testing the lock mechanism. The lock is rechecked but unfortunately the clip reopened.  The lock is unlocked and locked several times with the same technique used but the integrity of clip closure was not intact.  The clip was reopened, gripper arms raised, clip everted, and brought back into the left atrium where it was fully closed and then removed from the body as the lock mechanism was felt to be defective.  Another XT W clip was prepped and inserted through the steerable guide catheter into the left atrium where the exact same technique was performed.  The clip was again aligned over the medial aspect of A2 P2, advanced across the mitral valve into left ventricle, retracted with good leaflet capture, grippers dropped, and clip closed.  There again was an excellent result with good reduction in the medial jet.  The clip is interrogated, lock mechanism checked, and the clip is released successfully.  We then prepared to insert a second  clip over the lateral jet.  Placement of Clip #2 MitraClip Insertion The MitraClip XTW is prepped per protocol and inserted via the introducer into the steerable guide catheter The Clip Delivery System (CDS) is advanced under fluoro and echo guidance so that the sleeve markers are evenly spaced on each side of the guide marker  MitraClip Positioning in the Left Atrium (Supravalvular Alignment) M-knob is applied to bring the Clip towards the mitral valve. Echo guidance is used to avoid contact with LA structures. Low tidal volume respiration is initiated The Clip arms are opened to 180 degrees 2D and 3D TEE imaging is performed in multiple planes and the Clip is positioned and aligned above the valve using standard steering techniques   Entry into the Left Ventricle and Mitral Valve Leaflet Grasp The Clip is advanced across the mitral valve into the LV, maintaining proper orientation and with caution taken to avoid contact with the first Clip The Clip arms are opened further and the Clip is slowly retracted  Capture of both the anterior and posterior leaflets are visualized by echo and the grippers are dropped  MitraClip Deployment After extensive echo evaluation, reduction in mitral regurgitation is felt to be adequate Following standard protocol, the MitraClip device is deployed, the lock line is removed  Device Removal The clip delivery system is removed under echo guidance The steerable guide catheter is retracted into the right atrium and the interatrial septum is assessed by echo without evidence of right-to-left shunting or  large ASD  Hemostasis The guide catheter is removed over a 0.035" wire and the Perclose sutures are tightened with complete hemostasis and no evidence of hematoma  Estimated blood loss: minimal  There are no immediate procedural complications. The patient is transferred to the post-procedure recovery area in stable condition.   Final conclusion: Successful  TEER of the mitral valve placing 2 MitraClip XT W devices, first device placed medial A2/P2, second device placed lateral A2/P2, reducing MR from 4+ at baseline to 1+ post procedure.  Transmitral mean gradient post procedure is 2 mmHg.  Small residual ASD with left-to-right flow as expected.  Sherren Mocha 03/25/2022 10:42 AM

## 2022-03-25 NOTE — Progress Notes (Signed)
Mobility Specialist Progress Note    03/25/22 1532  Mobility  Activity Ambulated with assistance in hallway  Level of Assistance Contact guard assist, steadying assist  Assistive Device Other (Comment) (HHA)  Distance Ambulated (ft) 340 ft  Activity Response Tolerated well  $Mobility charge 1 Mobility   Pre-Mobility: 63 HR, 127/77 BP, 97% SpO2 During Mobility: 91 HR Post-Mobility: 80 HR  Pt received in bed and agreeable. No complaints on walk. Returned to sitting EOB with call bell in reach.    Hildred Alamin Mobility Specialist

## 2022-03-25 NOTE — Transfer of Care (Signed)
Immediate Anesthesia Transfer of Care Note  Patient: [name]  Procedure(s) Performed: MITRAL VALVE REPAIR TRANSESOPHAGEAL ECHOCARDIOGRAM (TEE)  Patient Location: Cath Lab  Anesthesia Type:General  Level of Consciousness: awake, alert , patient cooperative and responds to stimulation  Airway & Oxygen Therapy: Patient Spontanous Breathing and Patient connected to nasal cannula oxygen  Post-op Assessment: Report given to RN and Post -op Vital signs reviewed and stable  Post vital signs: Reviewed and stable  Last Vitals:  Vitals Value Taken Time  BP 125/77 03/25/22 1035  Temp    Pulse 70 03/25/22 1041  Resp 18 03/25/22 1042  SpO2 100 % 03/25/22 1041  Vitals shown include unvalidated device data.  Last Pain:  Vitals:   03/25/22 0634  PainSc: 0-No pain      Patients Stated Pain Goal: 0 (46/50/35 [id number])  Complications: There were no known notable events for this encounter.

## 2022-03-25 NOTE — Op Note (Addendum)
[facility]   MULTIDISCIPLINARY HEART TEAM  Date of Procedure:  03/25/2022  Preoperative Diagnosis: Severe Symptomatic Mitral Regurgitation (Stage D)  Postoperative Diagnosis: Same   Procedure Performed: Ultrasound-guided right transfemoral venous access Double PreClose right femoral vein Transseptal puncture using Bailess RF needle Mitral valve repair with MitraClip XTW x 2  Surgeon: Lenna Sciara, MD  Co-Surgeon: Sherren Mocha, MD  Echocardiographer: Sanda Klein, MD  Anesthesiologist: Albertha Ghee, MD  Device Implant: Mitraclip XTW, Serial # [id number]                            Mitraclip XTW, Serial [id number]  Procedural Indication: Severe Non-rheumatic Mitral Regurgitation (Stage D)   Brief History: The patient is an 86 year old male with a history of coronary artery disease status post remote PCI, hypertension, hyperlipidemia, persistent atrial fibrillation on apixaban, and severe symptomatic mitral regurgitation was evaluated in the outpatient setting.  He endorsed New York Heart Association class II-III symptoms of shortness of breath.  For the reason he was referred for mitral transcatheter edge-to-edge repair.  Echo Findings: Preop:  Normal LV systolic function Severe MR secondary to atriopathy, Grade 4+ NYHA class 3 Post-op:  Unchanged LV systolic function 1+ residual MR  Procedural Details: Prep The patient is brought to the cardiac catheterization lab in the fasting state. General anesthesia is induced. The patient is prepped from the groin to chin. A foley catheter is placed. Hemodynamics are monitored via a radial artery line.   Venous Access Using ultrasound guidance, the right femoral vein is punctured. Ultrasound images are captured and stored in the patient's chart. The vein is dilated and 2 Perclose devices are deplyed at 10' and 2' positions to 'Preclose' the femoral vein. An 8 Fr sheath is inserted.  Transseptal Puncture A  Baylis Versacross wire is advanced into the SVC A Baylis transseptal dilator is advanced into the SVC, and the VersaCross RF wire is retracted into the dilator  The transseptal sheath is retracted into the RA under fluoroscopic and echo guidance to obtain position on the posterior fossa where echo measurements are made to assure appropriate access to the mitral valve. Once proper position is confirmed by echo, RF energy is delivered and the VersaCross wire is advanced into the LA without resistance. The dilator and sheath are advanced over the wire where proper position is confirmed by echo and pressure measurement Weight based IV heparin is administered and a therapeutic ACT > 250 is confirmed  Steerable Guide Catheter Insertion The VersaCross wire is positioned at the left upper pulmonary vein The femoral vein is progressively dilated and the 24 Fr Steerable guide catheter is inserted and then directed across the interatrial septum over the wire. Position is confirmed approximately 3 cm into the left atrium The guide is de-aired   MitraClip Insertion The MitraClip XTW is prepped per protocol and inserted via the introducer into the steerable guide catheter The Clip Delivery System (CDS) is advanced under fluoro and echo guidance so that the sleeve markers are evenly spaced on each side of the guide marker  MitraClip Positioning in the Left Atrium (Supravalvular Alignment) M-knob is applied to bring the Clip towards the mitral valve. Echo guidance is used to avoid contact with LA structures. The Clip arms are opened to 180 degrees 2D and 3D TEE imaging is performed in multiple planes and the Clip is positioned and aligned above the valve using standard steering techniques   Entry into the  Left Ventricle and Mitral Valve Leaflet Grasp The Clip is advanced across the mitral valve into the LV, maintaining proper orientation The Clip arms are opened to 120 degrees and the Clip is slowly retracted   Capture of both the anterior and posterior leaflets are visualized by echo and the grippers are dropped  MitraClip Deployment After extensive echo evaluation, reduction in mitral regurgitation is felt to be adequate During check of the locking mechanism we found that the MitraClip prosthesis did not remain closed.  We then elected to remove this clip and prepared another XT W clip in the analogous manner as detailed above.  We position the clip in roughly the same position and deployed this clip and ensured that it was locked.  We now only had a visual jet laterally we elected to pursue a second XT W clip.  Placement of Clip #2 MitraClip Insertion The MitraClip XTW is prepped per protocol and inserted via the introducer into the steerable guide catheter The Clip Delivery System (CDS) is advanced under fluoro and echo guidance so that the sleeve markers are evenly spaced on each side of the guide marker  MitraClip Positioning in the Left Atrium (Supravalvular Alignment) M-knob is applied to bring the Clip towards the mitral valve. Echo guidance is used to avoid contact with LA structures. Low tidal volume respiration is initiated The Clip arms are opened to 180 degrees 2D and 3D TEE imaging is performed in multiple planes and the Clip is positioned and aligned above the valve using standard steering techniques   Entry into the Left Ventricle and Mitral Valve Leaflet Grasp The Clip is advanced across the mitral valve into the LV, maintaining proper orientation and with caution taken to avoid contact with the first Clip The Clip arms are opened further and the Clip is slowly retracted  Capture of both the anterior and posterior leaflets are visualized by echo and the grippers are dropped  MitraClip Deployment After extensive echo evaluation, reduction in mitral regurgitation is felt to be adequate Following standard protocol, the MitraClip device is deployed, gripper line and lock line are  removed  Device Removal The clip delivery system is removed under echo guidance The steerable guide catheter is retracted into the right atrium and the interatrial septum is assessed by echo without evidence of right-to-left shunting or large ASD  Hemostasis The guide catheter is removed over a 0.035" wire and the Perclose sutures are tightened with complete hemostasis and no evidence of hematoma.  Estimated blood loss: minimal  There are no immediate procedural complications. The patient is transferred to the post-procedure recovery area in stable condition.   Early Osmond 03/25/2022 10:45 AM

## 2022-03-25 NOTE — Anesthesia Procedure Notes (Signed)
Arterial Line Insertion Start/End8/24/2023 7:05 AM, 03/25/2022 7:10 AM Performed by: Albertha Ghee, MD, Roux Brandy Melanee Left, CRNA, CRNA  Patient location: Pre-op. Preanesthetic checklist: patient identified, IV checked, site marked, risks and benefits discussed, surgical consent, monitors and equipment checked, pre-op evaluation, timeout performed and anesthesia consent Lidocaine 1% used for infiltration Right, radial was placed Catheter size: 20 G Hand hygiene performed  and maximum sterile barriers used  Bogert's test indicative of satisfactory collateral circulation Attempts: 1 Procedure performed without using ultrasound guided technique. Following insertion, dressing applied and Biopatch. Post procedure assessment: unchanged and normal  Patient tolerated the procedure well with no immediate complications.

## 2022-03-25 NOTE — Anesthesia Procedure Notes (Signed)
Procedure Name: Intubation Date/Time: 03/25/2022 7:46 AM  Performed by: Glynda Jaeger, CRNAPre-anesthesia Checklist: Patient identified, Patient being monitored, Timeout performed, Emergency Drugs available and Suction available Patient Re-evaluated:Patient Re-evaluated prior to induction Oxygen Delivery Method: Circle System Utilized Preoxygenation: Pre-oxygenation with 100% oxygen Induction Type: IV induction Ventilation: Mask ventilation without difficulty Laryngoscope Size: Mac and 4 Grade View: Grade I Tube type: Oral Tube size: 7.5 mm Number of attempts: 1 Airway Equipment and Method: Stylet Placement Confirmation: ETT inserted through vocal cords under direct vision, positive ETCO2 and breath sounds checked- equal and bilateral Secured at: 22 cm Tube secured with: Tape Dental Injury: Teeth and Oropharynx as per pre-operative assessment

## 2022-03-25 NOTE — Anesthesia Preprocedure Evaluation (Signed)
Anesthesia Evaluation  Patient identified by MRN, date of birth, ID band Patient awake    Reviewed: Allergy & Precautions, H&P , NPO status , Patient's Chart, lab work & pertinent test results  Airway Mallampati: II   Neck ROM: full    Dental   Pulmonary former smoker,    breath sounds clear to auscultation       Cardiovascular hypertension, + CAD and + Past MI  + Valvular Problems/Murmurs MR  Rhythm:regular Rate:Normal  TEE (11/2021): Severe MR. Bileaflet mitral valve prolapse. Normal LVEF. LA severely dilated. Normal aortic valve.   Neuro/Psych    GI/Hepatic GERD  ,  Endo/Other  Hypothyroidism   Renal/GU Renal InsufficiencyRenal disease     Musculoskeletal  (+) Arthritis ,   Abdominal   Peds  Hematology   Anesthesia Other Findings   Reproductive/Obstetrics                             Anesthesia Physical Anesthesia Plan  ASA: 3  Anesthesia Plan: General   Post-op Pain Management:    Induction: Intravenous  PONV Risk Score and Plan: 2 and Ondansetron, Dexamethasone, Midazolam and Treatment may vary due to age or medical condition  Airway Management Planned: Oral ETT  Additional Equipment: TEE, Arterial line and 3D TEE  Intra-op Plan:   Post-operative Plan: Extubation in OR  Informed Consent: I have reviewed the patients History and Physical, chart, labs and discussed the procedure including the risks, benefits and alternatives for the proposed anesthesia with the patient or authorized representative who has indicated his/her understanding and acceptance.     Dental advisory given  Plan Discussed with: CRNA, Anesthesiologist and Surgeon  Anesthesia Plan Comments:         Anesthesia Quick Evaluation

## 2022-03-26 ENCOUNTER — Inpatient Hospital Stay (HOSPITAL_COMMUNITY): Payer: Medicare HMO

## 2022-03-26 ENCOUNTER — Other Ambulatory Visit: Payer: Self-pay

## 2022-03-26 ENCOUNTER — Encounter (HOSPITAL_COMMUNITY): Payer: Self-pay | Admitting: Internal Medicine

## 2022-03-26 DIAGNOSIS — I5032 Chronic diastolic (congestive) heart failure: Secondary | ICD-10-CM | POA: Diagnosis not present

## 2022-03-26 DIAGNOSIS — N1832 Chronic kidney disease, stage 3b: Secondary | ICD-10-CM | POA: Diagnosis not present

## 2022-03-26 DIAGNOSIS — E039 Hypothyroidism, unspecified: Secondary | ICD-10-CM | POA: Diagnosis not present

## 2022-03-26 DIAGNOSIS — Z9889 Other specified postprocedural states: Secondary | ICD-10-CM

## 2022-03-26 DIAGNOSIS — Z8616 Personal history of COVID-19: Secondary | ICD-10-CM | POA: Diagnosis not present

## 2022-03-26 DIAGNOSIS — I4819 Other persistent atrial fibrillation: Secondary | ICD-10-CM | POA: Diagnosis not present

## 2022-03-26 DIAGNOSIS — Z006 Encounter for examination for normal comparison and control in clinical research program: Secondary | ICD-10-CM | POA: Diagnosis not present

## 2022-03-26 DIAGNOSIS — E663 Overweight: Secondary | ICD-10-CM | POA: Diagnosis not present

## 2022-03-26 DIAGNOSIS — Z954 Presence of other heart-valve replacement: Secondary | ICD-10-CM | POA: Diagnosis not present

## 2022-03-26 DIAGNOSIS — Z6824 Body mass index (BMI) 24.0-24.9, adult: Secondary | ICD-10-CM | POA: Diagnosis not present

## 2022-03-26 DIAGNOSIS — I130 Hypertensive heart and chronic kidney disease with heart failure and stage 1 through stage 4 chronic kidney disease, or unspecified chronic kidney disease: Secondary | ICD-10-CM | POA: Diagnosis not present

## 2022-03-26 DIAGNOSIS — K219 Gastro-esophageal reflux disease without esophagitis: Secondary | ICD-10-CM | POA: Diagnosis not present

## 2022-03-26 DIAGNOSIS — I340 Nonrheumatic mitral (valve) insufficiency: Secondary | ICD-10-CM | POA: Diagnosis not present

## 2022-03-26 DIAGNOSIS — Z95818 Presence of other cardiac implants and grafts: Secondary | ICD-10-CM

## 2022-03-26 DIAGNOSIS — E7800 Pure hypercholesterolemia, unspecified: Secondary | ICD-10-CM | POA: Diagnosis not present

## 2022-03-26 LAB — CBC
HCT: 36.5 % — ABNORMAL LOW (ref 39.0–52.0)
Hemoglobin: 11.9 g/dL — ABNORMAL LOW (ref 13.0–17.0)
MCH: 30.6 pg (ref 26.0–34.0)
MCHC: 32.6 g/dL (ref 30.0–36.0)
MCV: 93.8 fL (ref 80.0–100.0)
Platelets: 107 10*3/uL — ABNORMAL LOW (ref 150–400)
RBC: 3.89 MIL/uL — ABNORMAL LOW (ref 4.22–5.81)
RDW: 16.9 % — ABNORMAL HIGH (ref 11.5–15.5)
WBC: 11.8 10*3/uL — ABNORMAL HIGH (ref 4.0–10.5)
nRBC: 0 % (ref 0.0–0.2)

## 2022-03-26 LAB — BASIC METABOLIC PANEL
Anion gap: 8 (ref 5–15)
BUN: 27 mg/dL — ABNORMAL HIGH (ref 8–23)
CO2: 28 mmol/L (ref 22–32)
Calcium: 9.1 mg/dL (ref 8.9–10.3)
Chloride: 104 mmol/L (ref 98–111)
Creatinine, Ser: 1.81 mg/dL — ABNORMAL HIGH (ref 0.61–1.24)
GFR, Estimated: 37 mL/min — ABNORMAL LOW (ref >60)
Glucose, Bld: 121 mg/dL — ABNORMAL HIGH (ref 70–99)
Potassium: 4.6 mmol/L (ref 3.5–5.1)
Sodium: 140 mmol/L (ref 135–145)

## 2022-03-26 LAB — ECHOCARDIOGRAM COMPLETE
Area-P 1/2: 1.99 cm2
Height: 69 in
MV VTI: 1.1 cm2
S' Lateral: 3.3 cm
Weight: 2640 oz

## 2022-03-26 MED ORDER — AMIODARONE HCL 200 MG PO TABS: 200.0000 mg | ORAL_TABLET | ORAL | 1 refills | Status: DC

## 2022-03-26 MED ORDER — PANTOPRAZOLE SODIUM 40 MG PO TBEC: 40.0000 mg | DELAYED_RELEASE_TABLET | Freq: Every day | ORAL | 0 refills | Status: DC

## 2022-03-26 MED ORDER — CLOPIDOGREL BISULFATE 75 MG PO TABS: 75.0000 mg | ORAL_TABLET | Freq: Every day | ORAL | 0 refills | Status: DC

## 2022-03-26 MED ORDER — FUROSEMIDE 20 MG PO TABS: 20.0000 mg | ORAL_TABLET | Freq: Every day | ORAL | Status: DC

## 2022-03-26 NOTE — Discharge Summary (Addendum)
[place] [facility]VE TEAM  Discharge Summary    Patient ID: [name] MRN: [id number]; DOB: 12/29/1935  Admit date: 03/25/2022 Discharge date: 03/26/2022  Primary Care Provider: Shirline Frees, MD  Primary Cardiologist: Minus Breeding, MD / Dr. Ali Lowe (clip)  Discharge Diagnoses    Principal Problem:   S/P mitral valve clip implantation Active Problems:   HYPERCHOLESTEROLEMIA-PURE   GERD   Overweight (BMI 25.0-29.9)   Essential hypertension   Dyslipidemia   Severe mitral regurgitation   Chronic atrial fibrillation (HCC)   CAD (coronary artery disease)   Allergies No Known Allergies  Diagnostic Studies/Procedures    [facility]   MULTIDISCIPLINARY HEART TEAM   Date of Procedure:                03/25/2022   Preoperative Diagnosis:      Severe Symptomatic Mitral Regurgitation (Stage D)   Postoperative Diagnosis:    Same    Procedure Performed: Ultrasound-guided right transfemoral venous access Double PreClose right femoral vein Transseptal puncture using Bailess RF needle Mitral valve repair with MitraClip XTW x 2   Surgeon: Lenna Sciara, MD  Co-Surgeon: Sherren Mocha, MD   Echocardiographer: Sanda Klein, MD   Anesthesiologist: Albertha Ghee, MD   Device Implant: Mitraclip XTW, Serial # [id number]                            Mitraclip XTW, Serial [id number]   Procedural Indication: Severe Non-rheumatic Mitral Regurgitation (Stage D)  _____________    Echo 03/26/22: completed but pending formal read at the time of discharge   History of Present Illness     [name] is a 86 y.o. male with a history of HTN, HLD, CAD s/p remote PCI, persistent atrial fibrillation on apixaban, CKD stage IIIb, and severe symptomatic mitral regurgitation who presented to [facility] on 03/25/22 for planned mTEER.   Patient has history of CAD with MI and POBA to LCx and LAD in 2001 as well as BMS to LCx in 2003.  He  reported exertional dyspnea beginning last year that he attributed to the after-effects of COVID-19 x 2 episodes. He saw Dr. Percival Spanish in 11/22/21 as a pre-op for lithotripsy and was noted to be in atrial fibrillation (first time that AF had been recorded).  Echo was then done, showing preserved LV EF but severe MR.  TEE in 5/23 showed EF 60-65%, mildly decreased RV systolic function, severe LAE, mild bileaflet myxomatous MV prolapse with severe MR (central).  Cath in 6/23 showed mild nonobstructive coronary disease.  Patient was sent to Dr Ali Lowe for consideration of mTEER.  He was evaluated by Dr. Aundra Dubin in the advanced CHF clinic on 03/12/22 who felt he had atrial functional MR. He suspected atrial fibrillation was playing a role in severe MR and that also the severe MR perpetuates the atrial fibrillation by ongoing LA dilation. He did not think he would hold NSR long-term with severe MR and felt that mTEER was indicated. He favored proceeding with mTEER and then trialing amiodarone followed by DCCV. He has follow up with Dr Aundra Dubin on 05/04/22.  mTEER set up for 03/05/22.  Hospital Course     Consultants: none    Severe non rheumatic MR: s/p placement of 2 XTW clips with residual mild mitral regurgitation. Post op echo completed today but pending formal read. Resumed on Eliquis with the addition of Plavix to be continued x  3 months. Plan on discharge home today with close follow up in the office next week.  Persistent atrial fibrillation: plan per Dr. Aundra Dubin is to start amiodarone with plans for DCCV. Resume on home Eliqius. I will start an oral amio load '400mg'$  BID x1 week, '200mg'$  BID x1 week followed by '200mg'$  daily. Plan to cardiovert in the near future after at least 3-4 weeks of uninterrupted oral anticoagulation.   Chronic diastolic CHF: appears euvolemic. Continue Farixga '10mg'$  daily. Resume home lasix '20mg'$  daily but hold x 3 days given elevated renal function.   CKD stage IIIb: creat 1.8 today, up  from 1.4. Recently started on Farixga. Will hold lasix '20mg'$  daily x 3 days and follow closely in the outpatient setting.  GERD: Nexium was changed to Protonix given potential drug drug interaction with Plavix. 3 month supply called in to pharmacy.    _____________  Discharge Vitals Blood pressure 127/77, pulse 69, temperature 98 F (36.7 C), temperature source Oral, resp. rate 15, height '5\' 9"'$  (1.753 m), weight 74.8 kg, SpO2 97 %.  Filed Weights   03/25/22 0615  Weight: 74.8 kg    No exam done bc patient getting echo during visit   Labs & Radiologic Studies    CBC Recent Labs    03/23/22 1400 03/26/22 0021  WBC 13.2* 11.8*  HGB 13.9 11.9*  HCT 43.3 36.5*  MCV 95.6 93.8  PLT 163 062*   Basic Metabolic Panel Recent Labs    03/23/22 1400 03/26/22 0021  NA 139 140  K 4.2 4.6  CL 106 104  CO2 26 28  GLUCOSE 97 121*  BUN 27* 27*  CREATININE 1.62* 1.81*  CALCIUM 9.4 9.1   Liver Function Tests Recent Labs    03/23/22 1400  AST 25  ALT 34  ALKPHOS 55  BILITOT 1.0  PROT 6.9  ALBUMIN 3.9   No results for input(s): "LIPASE", "AMYLASE" in the last 72 hours. Cardiac Enzymes No results for input(s): "CKTOTAL", "CKMB", "CKMBINDEX", "TROPONINI" in the last 72 hours. BNP Invalid input(s): "POCBNP" D-Dimer No results for input(s): "DDIMER" in the last 72 hours. Hemoglobin A1C No results for input(s): "HGBA1C" in the last 72 hours. Fasting Lipid Panel No results for input(s): "CHOL", "HDL", "LDLCALC", "TRIG", "CHOLHDL", "LDLDIRECT" in the last 72 hours. Thyroid Function Tests No results for input(s): "TSH", "T4TOTAL", "T3FREE", "THYROIDAB" in the last 72 hours.  Invalid input(s): "FREET3" _____________  ECHO TEE  Result Date: 03/25/2022    TRANSESOPHOGEAL ECHO REPORT   Patient Name:   [name] Date of Exam: 03/25/2022 Medical Rec #:  [id number]      Height:       69.0 in Accession #:    [id number]     Weight:       165.0 lb Date of Birth:  11-06-1935      BSA:           1.904 m Patient Age:    86 years       BP:           142/108 mmHg Patient Gender: M              HR:           78 bpm. Exam Location:  Inpatient Procedure: Transesophageal Echo, 3D Echo, Color Doppler and Cardiac Doppler Indications:     I34.0 Nonrheumatic mitral (valve) insufficiency  History:         Patient has prior history of Echocardiogram examinations, most  recent 12/12/2021. CAD, Arrythmias:Atrial Fibrillation; Risk                  Factors:Hypertension and Dyslipidemia.  Sonographer:     Raquel Sarna Senior RDCS Referring Phys:  [id number] Early Osmond Diagnosing Phys: Sanda Klein MD  Sonographer Comments: MitraClip Procedure PROCEDURE: After discussion of the risks and benefits of a TEE, an informed consent was obtained from the patient. The transesophogeal probe was passed without difficulty through the esophogus of the patient. Sedation performed by different physician. The patient was monitored while under deep sedation. The patient developed no complications during the procedure. TEE, including live and postprocessed 3D imaging, was used to guide transseptal puncture, device deployment and evaluate the final result. PREPROCEDURE FINDINGS: Normal left ventricular systolic function, LVEF 29%. Myxomatous mitral leaflets with severe centrally directed mitral insufficiency along the entire A2-P2 coaptation line, more prominently towards the lateral half of A2-P2. There is abolition of systolic forward flow in the right and left pulmonary veins, but there is no systolic flow reversal. MR Vena contracta 4 mm. Quantitation inaccurate due to atrial fibrillation. Baseline mean mitral gradient is 1 mm Hg at 77 bpm. MV area by planimetry is 5.6 cm sq. Trivial pericardial effusion, localized behind the left atrium and lateral to the right ventricle. POSTPROCEDURE FINDINGS: Unchanged, normal left ventricular systolic function. Two MitraClip XTW devices are well-seated, with excellent tissue  bridge. There is mild residual mitral insufficiency, mostly immediately medial to the more lateral clip. There is prominent systolic forward flow in both right and left pulmonary veins (diastolic dominant, atrial fibrillation). Mitral valve mean gradient 2 mm Hg at 80 bpm. Pressure half time 130 ms. There is a small iatrogenic secundum atrial septal defect with exclusively left-to-right flow. Unchanged trivial pericardial effusion. IMPRESSIONS  1. Left ventricular ejection fraction, by estimation, is 55 to 60%. The left ventricle has normal function. The left ventricle has no regional wall motion abnormalities. Left ventricular diastolic function could not be evaluated.  2. Right ventricular systolic function is mildly reduced. The right ventricular size is normal.  3. Left atrial size was severely dilated. No left atrial/left atrial appendage thrombus was detected.  4. Right atrial size was severely dilated.  5. The mitral valve is myxomatous. Severe mitral valve regurgitation. No evidence of mitral stenosis. There is moderate holosystolic prolapse of both leaflets of the mitral valve. The mean mitral valve gradient is 2.0 mmHg.  6. The aortic valve is tricuspid. There is mild thickening of the aortic valve. Aortic valve regurgitation is not visualized. Aortic valve sclerosis is present, with no evidence of aortic valve stenosis.  7. Evidence of atrial level shunting detected by color flow Doppler. There is a small secundum atrial septal defect with predominantly left to right shunting across the atrial septum. FINDINGS  Left Ventricle: Left ventricular ejection fraction, by estimation, is 55 to 60%. The left ventricle has normal function. The left ventricle has no regional wall motion abnormalities. The left ventricular internal cavity size was normal in size. Left ventricular diastolic function could not be evaluated due to atrial fibrillation. Left ventricular diastolic function could not be evaluated. Right  Ventricle: The right ventricular size is normal. No increase in right ventricular wall thickness. Right ventricular systolic function is mildly reduced. Left Atrium: Left atrial size was severely dilated. No left atrial/left atrial appendage thrombus was detected. Right Atrium: Right atrial size was severely dilated. Pericardium: Trivial pericardial effusion is present. The pericardial effusion is localized near the left atrium and  localized near the right ventricle. Mitral Valve: The mitral valve is myxomatous. There is moderate holosystolic prolapse of both leaflets of the mitral valve. There is mild thickening of the anterior and posterior mitral valve leaflet(s). Severe mitral valve regurgitation. No evidence of mitral valve stenosis. MV peak gradient, 4.6 mmHg. The mean mitral valve gradient is 2.0 mmHg with average heart rate of 77 bpm. Tricuspid Valve: The tricuspid valve is normal in structure. Tricuspid valve regurgitation is mild. Aortic Valve: The aortic valve is tricuspid. There is mild thickening of the aortic valve. Aortic valve regurgitation is not visualized. Aortic valve sclerosis is present, with no evidence of aortic valve stenosis. Pulmonic Valve: The pulmonic valve was normal in structure. Pulmonic valve regurgitation is trivial. Aorta: The aortic root, ascending aorta, aortic arch and descending aorta are all structurally normal, with no evidence of dilitation or obstruction. IAS/Shunts: Evidence of atrial level shunting detected by color flow Doppler. There is a small secundum atrial septal defect with predominantly left to right shunting across the atrial septum.  AORTIC VALVE LVOT Vmax:   54.40 cm/s LVOT Vmean:  36.800 cm/s LVOT VTI:    0.108 m MITRAL VALVE MV Peak grad: 4.6 mmHg        SHUNTS MV Mean grad: 2.0 mmHg        Systemic VTI: 0.11 m MV Vmax:      1.07 m/s MV Vmean:     58.2 cm/s MR Peak grad:    65.3 mmHg MR Mean grad:    46.0 mmHg MR Vmax:         404.00 cm/s MR Vmean:         324.0 cm/s MR PISA:         3.08 cm MR PISA Eff ROA: 23 mm MR PISA Radius:  0.70 cm Dani Gobble Croitoru MD Electronically signed by Sanda Klein MD Signature Date/Time: 03/25/2022/2:50:25 PM    Final    CARDIAC CATHETERIZATION  Result Date: 03/25/2022 1.  Status post 2 XTW clips with resultant mild mitral regurgitation.   DG Chest 2 View  Result Date: 03/24/2022 CLINICAL DATA:  Mitral insufficiency EXAM: CHEST - 2 VIEW COMPARISON:  12/03/2021 FINDINGS: Right shoulder replacement. Mild cardiomegaly without acute airspace disease, pleural effusion or pneumothorax. No edema. Aortic atherosclerosis. IMPRESSION: No active cardiopulmonary disease.  Mild cardiomegaly Electronically Signed   By: Donavan Foil M.D.   On: 03/24/2022 23:11    Disposition   Pt is being discharged home today in good condition.  Follow-up Plans & Appointments     Follow-up Information     Tommie Raymond, NP. Go on 03/31/2022.   Specialty: Cardiology Why: @ 8:45 am. please arrive at least 10 minutes early. Contact information: [place] [address] [phone]                  Discharge Medications   Allergies as of 03/26/2022   No Known Allergies      Medication List     STOP taking these medications    esomeprazole 40 MG capsule Commonly known as: [place] Replaced by: pantoprazole 40 MG tablet   predniSONE 10 MG tablet Commonly known as: DELTASONE       TAKE these medications    acetaminophen 500 MG tablet Commonly known as: TYLENOL Take 500 mg by mouth every 6 (six) hours as needed for moderate pain.   amiodarone 200 MG tablet Commonly known as: Pacerone Take 1 tablet (200 mg total) by mouth  as directed. Take '400mg'$  (two tablets) twice a day x 1 week, take '200mg'$  (one tablet) twice a day x 1 week, then take '200mg'$  (one tablet) daily thereafter.   apixaban 5 MG Tabs tablet Commonly known as: ELIQUIS Take 1 tablet (5 mg total) by mouth 2 (two) times  daily.   atorvastatin 80 MG tablet Commonly known as: LIPITOR Take 1 tablet (80 mg total) by mouth at bedtime.   B-12 5000 MCG Caps Take 5,000 mcg by mouth daily.   clopidogrel 75 MG tablet Commonly known as: PLAVIX Take 1 tablet (75 mg total) by mouth daily with breakfast.   dapagliflozin propanediol 10 MG Tabs tablet Commonly known as: Farxiga Take 1 tablet (10 mg total) by mouth daily before breakfast.   diclofenac Sodium 1 % Gel Commonly known as: VOLTAREN Apply 1 Application topically 4 (four) times daily as needed (pain).   docusate sodium 100 MG capsule Commonly known as: Colace Take 1 capsule (100 mg total) by mouth 2 (two) times daily. What changed:  when to take this reasons to take this   ferrous sulfate 325 (65 FE) MG tablet Commonly known as: FerrouSul Take 1 tablet (325 mg total) by mouth 3 (three) times daily with meals. What changed: when to take this   folic acid 161 MCG tablet Commonly known as: FOLVITE Take 800 mcg by mouth daily.   furosemide 20 MG tablet Commonly known as: LASIX Take 1 tablet (20 mg total) by mouth daily. Notes to patient: HOLD FOR THREE DAYS. OKAY TO RESUME 8/29.    levothyroxine 25 MCG tablet Commonly known as: SYNTHROID Take 25 mcg by mouth daily before breakfast.   metoprolol tartrate 25 MG tablet Commonly known as: LOPRESSOR Take 1 tablet (25 mg total) by mouth 2 (two) times daily.   niacin 500 MG tablet Commonly known as: VITAMIN B3 Take 500 mg by mouth daily.   nitroGLYCERIN 0.4 MG SL tablet Commonly known as: NITROSTAT Place 0.4 mg under the tongue every 5 (five) minutes as needed for chest pain.   pantoprazole 40 MG tablet Commonly known as: PROTONIX Take 1 tablet (40 mg total) by mouth daily. Replaces: esomeprazole 40 MG capsule Notes to patient: Nexium was changed to Protonix given potential drug drug interaction with Plavix. When you come off Plavix, you can resume Nexium    Vitamin D 50 MCG (2000 UT)  tablet Take 2,000 Units by mouth daily.        Outstanding Labs/Studies   BMET  Duration of Discharge Encounter   Greater than 30 minutes including physician time.  Mable Fill, PA-C 03/26/2022, 9:47 AM [phone]   ATTENDING ATTESTATION:  After conducting a review of all available clinical information with the care team, interviewing the patient, and performing a physical exam, I agree with the findings and plan described in this note.   GEN: No acute distress.   HEENT:  MMM, no JVD, no scleral icterus Cardiac: Irregular RRR, with 2 out of 6 systolic murmur Respiratory: Clear to auscultation bilaterally. GI: Soft, nontender, non-distended  MS: No edema; No deformity. Neuro:  Nonfocal  Vasc:  +2 radial pulses; right femoral venous access site clean dry and intact  The patient is doing well after uncomplicated MitraClip procedure with 2 XTW clip was implanted with resultant mild mitral regurgitation.  My review of the patient's echocardiogram today demonstrates stable he implanted clips with mild mitral regurgitation and no pericardial effusion.  Final read is pending.  Patient will be discharged today.  Continue apixaban indefinitely and Plavix for 3 months.  We will load the patient with amiodarone in preparation for future cardioversion.  Lenna Sciara, MD Pager [phone]

## 2022-03-26 NOTE — Anesthesia Postprocedure Evaluation (Signed)
Anesthesia Post Note  Patient: [name]  Procedure(s) Performed: MITRAL VALVE REPAIR TRANSESOPHAGEAL ECHOCARDIOGRAM (TEE)     Patient location during evaluation: PACU Anesthesia Type: General Level of consciousness: awake and alert Pain management: pain level controlled Vital Signs Assessment: post-procedure vital signs reviewed and stable Respiratory status: spontaneous breathing, nonlabored ventilation, respiratory function stable and patient connected to nasal cannula oxygen Cardiovascular status: blood pressure returned to baseline and stable Postop Assessment: no apparent nausea or vomiting Anesthetic complications: no   There were no known notable events for this encounter.  Last Vitals:  Vitals:   03/25/22 2326 03/26/22 0334  BP: (!) 99/54 101/61  Pulse: 77 69  Resp:    Temp: 36.8 C 36.7 C  SpO2: 95% 96%    Last Pain:  Vitals:   03/26/22 0334  TempSrc: Oral  PainSc:                  [place] S

## 2022-03-26 NOTE — Plan of Care (Signed)

## 2022-03-26 NOTE — Progress Notes (Signed)
CARDIAC REHAB PHASE I   PRE:  Rate/Rhythm: 78 A fib  BP:  Sitting: 122/77      SaO2: 97 RA  MODE:  Ambulation:    POST:  Rate/Rhythm: 81 a fib  BP:  Sitting:       SaO2: 98 RA  Pt up out of bed and walking to door without difficulty. Echo arrived for pt test. Returned to bed for test. Pt teaching including site care, heart healthy diet, risk factors, exercise guidelines, restrictions,and CRP2. Pt declined CRP2 at this time. All questions and concerns addressed. Pt plans for home today.    [id number] Vanessa Barbara, RN BSN 03/26/2022 8:39 AM

## 2022-03-26 NOTE — Progress Notes (Signed)
Echocardiogram 2D Echocardiogram has been performed.  [name] 03/26/2022, 9:00 AM

## 2022-03-26 NOTE — Progress Notes (Signed)
RN went over d/c summary w/ pt and his family. NT removed PIVs. Belongings w/ pt. NT transported pt to private vehicle where pt's fam will transport him home

## 2022-03-28 NOTE — H&P (View-Only) (Signed)
HEART AND [place]                                     Cardiology Office Note:    Date:  03/31/2022   ID:  [name], DOB 07-03-1936, MRN [id number]  PCP:  Shirline Frees, MD  [facility] Cardiologist:  Minus Breeding, MD  [facility] Electrophysiologist:  None   Referring MD: Shirline Frees, MD   Chief Complaint  Patient presents with   Follow-up    [facility] s/p MitraClip    History of Present Illness:    [name] is a 86 y.o. male with a hx of HTN, HLD, CAD s/p remote PCI, persistent atrial fibrillation on apixaban, CKD stage IIIb, and severe symptomatic mitral regurgitation who presented to [facility] on 03/25/22 for planned mTEER and is being seen today for post procedure follow up.    [name] has history of CAD with MI and POBA to LCx and LAD in 2001 as well as BMS to LCx in 2003.  He reported exertional dyspnea beginning last year that he attributed to the after-effects of COVID-19 x 2 episodes. He saw Dr. Percival Spanish in 11/22/21 as a pre-op for lithotripsy and was noted to be in atrial fibrillation (first time that AF had been recorded).  Echo was then done, showing preserved LV EF but severe MR. TEE in 5/23 showed EF 60-65%, mildly decreased RV systolic function, severe LAE, mild bileaflet myxomatous MV prolapse with severe MR (central).  Cath in 6/23 showed mild nonobstructive coronary disease.  Patient was sent to Dr Ali Lowe for consideration of mTEER.   He was evaluated by Dr. Aundra Dubin in the advanced CHF clinic on 03/12/22 who felt he had atrial functional MR. He suspected atrial fibrillation was playing a role in severe MR and that also the severe MR perpetuates the atrial fibrillation by ongoing LA dilation. He did not think he would hold NSR long-term with severe MR and felt that mTEER was indicated. He favored proceeding with mTEER and then trialing amiodarone followed by DCCV. He was started on Farxiga '10mg'$  at that time. He has  follow up with Dr Aundra Dubin on 05/04/22.   He underwent successful mTEER 03/25/22 with 2 XTW clips with residual mild mitral regurgitation. Post op echo with a MV peak gradient at 11.0 mmHg and mean mitral valve gradient at 4.8 mmHg with average heart rate of 77 bpm. He was resumed on Eliquis with the addition of Plavix to be continued x 3 months (06/25/22).  Today he is here alone and reports that he has been doing well since clip placement however continues to have some underlying SOB with exertion. He has refrained from mowing his grass and other strenuous activities until he was seen today. He denies chest pain, LE edema, palpitations, dizziness, bleeding in stool or urine, or syncope. He has some bruising on his upper and lower extremities. Groin site is stable. EKG today shows rate controlled AF. He is being loaded with amiodarone.    Past Medical History:  Diagnosis Date   Arthritis    knees,back   CAD (coronary artery disease) CARDIOLOGIST-  DR HOCHREIN   previous myocardial infarction in 2001 with 90% circumflex lesion treated with PTCA. The LAD had 70-80% stenosis which was treawted with angioplasty. Most recent catheterization in 2003 demonstrated the LAD at 30% mid stenosis, circumflex 60-70% stenosis in  the mid AV grove, and 40% stenosis in the previously stented area. The right coronary artery had 30-40% stenosis in the mid portion.    GERD (gastroesophageal reflux disease)    History of kidney stones    Hypertension    Hypothyroidism    Left ureteral stone    Pre-diabetes    S/P mitral valve clip implantation 03/25/2022   s/p TEER wtih two XTW MitraClips by Dr. Burt Knack and Ali Lowe   Severe mitral regurgitation    Skin cancer    face    Past Surgical History:  Procedure Laterality Date   APPENDECTOMY  as teen   BUBBLE STUDY  12/11/2021   Procedure: BUBBLE STUDY;  Surgeon: Elouise Munroe, MD;  Location: [facility] ENDOSCOPY;  Service: Cardiology;;   CARDIAC CATHETERIZATION   03-29-2000   dr hochrein   Nonobstuctive CAD /   CFX patent stent and LAD diffuse luminal irregularities including mid segment previously been angioplastied   CARDIOVASCULAR STRESS TEST  05-09-2015   dr hochrein   low risk perfusion study/  no reversible ischemia/  normal LV function and wall motion , ef 63%   CATARACT EXTRACTION W/ INTRAOCULAR LENS  IMPLANT, BILATERAL  left 05-06-2015/  right 06-10-2015   CORONARY ANGIOPLASTY WITH STENT PLACEMENT  01-21-2000  dr hochrein   PTCA to pLAD/  PTCA and BMS x1 to mid LCFX/  normal LVSF   CORONARY ANGIOPLASTY WITH STENT PLACEMENT  01-10-2002  dr Lyndel Safe   PTCA and BMS to mid AV LCFX (in-stent restenosis)/  diffuse 30-40% mRCA ,  mLAD diffuse 30%,  normal LVSF, ef 65%   CYSTOSCOPY/RETROGRADE/URETEROSCOPY/STONE EXTRACTION WITH BASKET Left 05/23/2015   Procedure: CYSTOSCOPY, FULGERATION OF PROSTATE,  LEFT URETEROSCOPY, JJ STENT PLACEMENT;  Surgeon: Festus Aloe, MD;  Location: [facility];  Service: Urology;  Laterality: Left;   ELECTROPHYSIOLOGIC STUDY  03-30-2000   dr Caryl Comes   EXTRACORPOREAL SHOCK WAVE LITHOTRIPSY Left 10-30-2012   EXTRACORPOREAL SHOCK WAVE LITHOTRIPSY Right 11/16/2021   Procedure: EXTRACORPOREAL SHOCK WAVE LITHOTRIPSY (ESWL);  Surgeon: Franchot Gallo, MD;  Location: [facility];  Service: Urology;  Laterality: Right;   HOLMIUM LASER APPLICATION Left [id number]/76/7341   Procedure: WITH HOLMIUM LASER LITHOTRIPSY;  Surgeon: Festus Aloe, MD;  Location[facility];  Service: Urology;  Laterality: Left;   KNEE ARTHROSCOPY Bilateral x2 right/  left 2015   LUMBAR LAMINECTOMY/DECOMPRESSION MICRODISCECTOMY N/A 09/28/2012   Procedure: L3  -L5 DECOMPRESSION L4 - L5 FUSION WITH INSTRUMENTATION 2 LEVELS;  Surgeon: Melina Schools, MD;  Location: [place];  Service: Orthopedics;  Laterality: N/A;   MITRAL VALVE REPAIR N/A 03/25/2022   Procedure: MITRAL VALVE REPAIR;  Surgeon: Early Osmond, MD;  Location:  [place] CV [facility];  Service: Cardiovascular;  Laterality: N/A;   RIGHT/LEFT HEART CATH AND CORONARY ANGIOGRAPHY N/A 01/27/2022   Procedure: RIGHT/LEFT HEART CATH AND CORONARY ANGIOGRAPHY;  Surgeon: Early Osmond, MD;  Location: [place] CV [facility];  Service: Cardiovascular;  Laterality: N/A;   TEE WITHOUT CARDIOVERSION N/A 12/11/2021   Procedure: TRANSESOPHAGEAL ECHOCARDIOGRAM (TEE);  Surgeon: Elouise Munroe, MD;  Location: [place];  Service: Cardiology;  Laterality: N/A;   TEE WITHOUT CARDIOVERSION N/A 03/25/2022   Procedure: TRANSESOPHAGEAL ECHOCARDIOGRAM (TEE);  Surgeon: Early Osmond, MD;  Location: [place] CV [facility];  Service: Cardiovascular;  Laterality: N/A;   TONSILLECTOMY  as child   TOTAL KNEE ARTHROPLASTY Left 10/02/2018   Procedure: TOTAL LEFT  KNEE ARTHROPLASTY;  Surgeon: Paralee Cancel, MD;  Location: [facility];  Service: Orthopedics;  Laterality: Left;  90 mins- Ok per Tiffany   TOTAL SHOULDER ARTHROPLASTY Right 01/14/2011   TRANSTHORACIC ECHOCARDIOGRAM  05-21-2015   mild LVH,  ef 55-605,  grade 2 diastolic dysfunction, basal inferior hyperkinesis/  moderate AV calcification sclerosis without stenosis/  trivial MR/  moderate LAE/  mild TR/  mild RAE    Current Medications: Current Meds  Medication Sig   acetaminophen (TYLENOL) 500 MG tablet Take 500 mg by mouth every 6 (six) hours as needed for moderate pain.   amiodarone (PACERONE) 200 MG tablet Take 1 tablet (200 mg total) by mouth as directed. Take '400mg'$  (two tablets) twice a day x 1 week, take '200mg'$  (one tablet) twice a day x 1 week, then take '200mg'$  (one tablet) daily thereafter.   apixaban (ELIQUIS) 5 MG TABS tablet Take 1 tablet (5 mg total) by mouth 2 (two) times daily.   atorvastatin (LIPITOR) 80 MG tablet Take 1 tablet (80 mg total) by mouth at bedtime.   Cholecalciferol (VITAMIN D) 50 MCG (2000 UT) tablet Take 2,000 Units by mouth daily.   clopidogrel (PLAVIX) 75 MG tablet Take 1 tablet (75 mg total) by mouth  daily with breakfast.   Cyanocobalamin (B-12) 5000 MCG CAPS Take 5,000 mcg by mouth daily.   dapagliflozin propanediol (FARXIGA) 10 MG TABS tablet Take 1 tablet (10 mg total) by mouth daily before breakfast.   diclofenac Sodium (VOLTAREN) 1 % GEL Apply 1 Application topically 4 (four) times daily as needed (pain).   ferrous sulfate (FERROUSUL) 325 (65 FE) MG tablet Take 1 tablet (325 mg total) by mouth 3 (three) times daily with meals. (Patient taking differently: Take 325 mg by mouth daily with breakfast. Take 1 tablet by mouth daily. Iron)   folic acid (FOLVITE) 725 MCG tablet Take 800 mcg by mouth daily.    furosemide (LASIX) 20 MG tablet Take 1 tablet (20 mg total) by mouth daily.   levothyroxine (SYNTHROID) 25 MCG tablet Take 25 mcg by mouth daily before breakfast.   metoprolol tartrate (LOPRESSOR) 25 MG tablet Take 1 tablet (25 mg total) by mouth 2 (two) times daily.   niacin 500 MG tablet Take 500 mg by mouth daily.   nitroGLYCERIN (NITROSTAT) 0.4 MG SL tablet Place 0.4 mg under the tongue every 5 (five) minutes as needed for chest pain.   pantoprazole (PROTONIX) 40 MG tablet Take 1 tablet (40 mg total) by mouth daily.     Allergies:   Patient has no known allergies.   Social History   Socioeconomic History   Marital status: Married    Spouse name: Not on file   Number of children: 2   Years of education: Not on file   Highest education level: Not on file  Occupational History   Occupation: retired  Tobacco Use   Smoking status: Former    Packs/day: 1.00    Years: 3.00    Total pack years: 3.00    Types: Cigarettes    Quit date: 09/26/1962    Years since quitting: 59.5   Smokeless tobacco: Never  Vaping Use   Vaping Use: Never used  Substance and Sexual Activity   Alcohol use: No   Drug use: No   Sexual activity: Not Currently  Other Topics Concern   Not on file  Social History Narrative   Lives at home with wife.  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     Social Determinants of  Health   Financial Resource Strain: Not on file  Food Insecurity: Not on file  Transportation Needs: Not on file  Physical Activity: Not on file  Stress: Not on file  Social Connections: Not on file     Family History: The patient's family history includes Heart failure in his mother; Prostate cancer in his father.  ROS:   Please see the history of present illness.    All other systems reviewed and are negative.  EKGs/Labs/Other Studies Reviewed:    The following studies were reviewed today:  Echocardiogram 03/26/22:  Left ventricular ejection fraction, by estimation, is 50 to 55%. The left ventricle has low normal function. The left ventricle demonstrates regional wall motion abnormalities (see scoring diagram/findings for description). Left ventricular diastolic function could not be evaluated. There is moderate hypokinesis of the left ventricular, basal-mid lateral wall. 1. Right ventricular systolic function is normal. The right ventricular size is normal. There is normal pulmonary artery systolic pressure. The estimated right ventricular systolic pressure is 16.9 mmHg. 2. 3. Left atrial size was severely dilated. 4. Right atrial size was severely dilated. Pressure half time 115 ms. The mitral valve is myxomatous. Mild mitral valve regurgitation. Mild mitral stenosis. The mean mitral valve gradient is 4.8 mmHg with average heart rate of 77 bpm. Procedure Date: 03/25/2022. 5. 6. Tricuspid valve regurgitation is mild to moderate. The aortic valve is tricuspid. Aortic valve regurgitation is not visualized. Aortic valve sclerosis is present, with no evidence of aortic valve stenosis. 7. Evidence of atrial level shunting detected by color flow Doppler. There is a small secundum atrial septal defect with predominantly left to right shunting across the atrial septum.  Pressure half time 115 ms. The mitral valve is myxomatous. Mild mitral valve regurgitation. There is a  Mitra-Clip present in the mitral position. Mild mitral valve stenosis. MV peak gradient, 11.0 mmHg. The mean mitral valve gradient is 4.8 mmHg with average heart rate of 77 bpm.   [facility]ENTER   MULTIDISCIPLINARY HEART TEAM   Date of Procedure:                03/25/2022   Preoperative Diagnosis:      Severe Symptomatic Mitral Regurgitation (Stage D)   Postoperative Diagnosis:    Same    Procedure Performed: Ultrasound-guided right transfemoral venous access Double PreClose right femoral vein Transseptal puncture using Bailess RF needle Mitral valve repair with MitraClip XTW x 2   Surgeon: Lenna Sciara, MD  Co-Surgeon: Sherren Mocha, MD   Echocardiographer: Sanda Klein, MD   Anesthesiologist: Albertha Ghee, MD   Device Implant: Mitraclip XTW, Serial # [id number]                            Mitraclip XTW, Serial [id number]   Procedural Indication: Severe Non-rheumatic Mitral Regurgitation (Stage D)    EKG:  EKG is ordered today.  The ekg ordered today demonstrates AF with SVR with HR 59bpm   Recent Labs: 03/23/2022: ALT 34; B Natriuretic Peptide 884.9 03/31/2022: BUN 36; Creatinine, Ser 1.80; Hemoglobin 13.0; Platelets 119; Potassium 4.3; Sodium 140   Recent Lipid Panel    Component Value Date/Time   CHOL 115 11/26/2013 1044   TRIG 48.0 11/26/2013 1044   HDL 49.70 11/26/2013 1044   CHOLHDL 2 11/26/2013 1044   VLDL 9.6 11/26/2013 1044   LDLCALC 56 11/26/2013 1044   Physical Exam:    VS:  BP 130/70   Pulse (!) 59   Ht '5\' 9"'$  (1.753 m)  Wt 163 lb 9.6 oz (74.2 kg)   SpO2 99%   BMI 24.16 kg/m     Wt Readings from Last 3 Encounters:  03/31/22 163 lb 9.6 oz (74.2 kg)  03/25/22 165 lb (74.8 kg)  03/23/22 166 lb 8 oz (75.5 kg)    General: Well developed, well nourished, NAD Lungs:Clear to ausculation bilaterally. No wheezes, rales, or rhonchi. Breathing is unlabored. Cardiovascular: Irregularly irregular. No murmurs Extremities: No edema. Neuro:  Alert and oriented. No focal deficits. No facial asymmetry. MAE spontaneously. Psych: Responds to questions appropriately with normal affect.    ASSESSMENT/PLAN:    Severe non rheumatic MR: s/p MitraClip with XTW clips x2 with residual mild mitral regurgitation. Post op echo with a MV peak gradient at 11.0 mmHg and mean mitral valve gradient at 4.8 mmHg with average heart rate of 77 bpm. He was resumed on Eliquis with the addition of Plavix to be continued x 3 months (06/25/22). SBE discussed today and RXed with Amoxicillin 2g prior to dental procedures and cleanings. Plan DCCV 9/15 and follow with me 9/18 with echocardiogram.    Persistent atrial fibrillation: Plan per Dr. Aundra Dubin was to start amiodarone with plans for DCCV. He was resumed on home Eliqius post clip with the addition of Plavix. Currently taking amiodarone load '400mg'$  BID x1 week, '200mg'$  BID x1 week followed by '200mg'$  daily. EKG today with AF with SVR HR 59bpm. Asymptomatic although still having some exertional SOB which may be a symptom of his AF. He has been compliant with AC. Plan DCCV 04/16/22 and follow up with myself 04/19/22 with repeat echocardiogram. BMET, CBC today.   Chronic diastolic CHF: Appears euvolemic today. Started on Farxiga prior to clip and has had a bump in Cr to 1.8 from 1.3. Will hold for now and may be able to restart at a later time. May need to hold Lasix as well. Will address at follow up 9/18 and repeat labs.   CKD stage IIIb: Post procedure Cr up to 1.81. Repeat today remains elevated at 1.80. Baseline appears to be in the 1.3-1.4 range. Recently started on Farixga. Lasix '20mg'$  held 3 days after discharge. Will stop Wilder Glade for now and repeat labs after cardioversion. May need to hold Lasix as well.    GERD: Nexium was changed to Protonix given potential drug drug interaction with Plavix. Can transition back once Plavix stopped.     Medication Adjustments/Labs and Tests Ordered: Current medicines are reviewed  at length with the patient today.  Concerns regarding medicines are outlined above.  Orders Placed This Encounter  Procedures   Basic metabolic panel   CBC   EKG 12-Lead   No orders of the defined types were placed in this encounter.   Patient Instructions  Medication Instructions:  Your physician recommends that you continue on your current medications as directed. Please refer to the Current Medication list given to you today.  *If you need a refill on your cardiac medications before your next appointment, please call your pharmacy*   Lab Work: TODAY: BMET, CBC If you have labs (blood work) drawn today and your tests are completely normal, you will receive your results only by: [place] (if you have MyChart) OR A paper copy in the mail If you have any lab test that is abnormal or we need to change your treatment, we will call you to review the results.   Testing/Procedures: You are scheduled for a Cardioversion on 04/16/22 with Dr. Audie Box.  Please arrive at the  [employer] ([employer] at [facility]: [address] [place], [place] [address] at 7:30 am.  DIET: Nothing to eat or drink after midnight except a sip of water with medications (see medication instructions below)  FYI: For your safety, and to allow [place] to monitor your vital signs accurately during the surgery/procedure we request that   if you have artificial nails, gel coating, SNS etc. Please have those removed prior to your surgery/procedure. Not having the nail coverings /polish removed may result in cancellation or delay of your surgery/procedure.   Medication Instructions: Hold LASIX MORNING OF PROCEDURE   Continue your anticoagulant: ELIQUIS You will need to continue your anticoagulant after your procedure until you  are told by your  Provider that it is safe to stop   Labs: TODAY  You must have a responsible person to drive you home and stay in the waiting area during your procedure.  Failure to do so could result in cancellation.  Bring your insurance cards.  *Special Note: Every effort is made to have your procedure done on time. Occasionally there are emergencies that occur at the hospital that may cause delays. Please be patient if a delay does occur.     Follow-Up: At [facility], you and your health needs are our priority.  As part of our continuing mission to provide you with exceptional heart care, we have created designated Provider Care Teams.  These Care Teams include your primary Cardiologist (physician) and Advanced Practice Providers (APPs -  Physician Assistants and Nurse Practitioners) who all work together to provide you with the care you need, when you need it.  We recommend signing up for the patient portal called "MyChart".  Sign up information is provided on this After Visit Summary.  MyChart is used to connect with patients for Virtual Visits (Telemedicine).  Patients are able to view lab/test results, encounter notes, upcoming appointments, etc.  Non-urgent messages can be sent to your provider as well.   To learn more about what you can do with MyChart, go to [email or web address].    Your next appointment:   KEEPS SCHEDULED FOLLOW-UP  Important Information About Sugar         Signed, Kathyrn Drown, NP  03/31/2022 2:39 PM    [place]

## 2022-03-28 NOTE — Progress Notes (Unsigned)
HEART AND [place]                                     Cardiology Office Note:    Date:  03/31/2022   ID:  CREED KAIL, DOB 1936/03/22, MRN [id number]  PCP:  Shirline Frees, MD  [facility] Cardiologist:  Minus Breeding, MD  [facility] Electrophysiologist:  None   Referring MD: Shirline Frees, MD   Chief Complaint  Patient presents with   Follow-up    [facility] s/p MitraClip    History of Present Illness:    [name] is a 86 y.o. male with a hx of HTN, HLD, CAD s/p remote PCI, persistent atrial fibrillation on apixaban, CKD stage IIIb, and severe symptomatic mitral regurgitation who presented to [facility] on 03/25/22 for planned mTEER and is being seen today for post procedure follow up.    [name] has history of CAD with MI and POBA to LCx and LAD in 2001 as well as BMS to LCx in 2003.  He reported exertional dyspnea beginning last year that he attributed to the after-effects of COVID-19 x 2 episodes. He saw Dr. Percival Spanish in 11/22/21 as a pre-op for lithotripsy and was noted to be in atrial fibrillation (first time that AF had been recorded).  Echo was then done, showing preserved LV EF but severe MR. TEE in 5/23 showed EF 60-65%, mildly decreased RV systolic function, severe LAE, mild bileaflet myxomatous MV prolapse with severe MR (central).  Cath in 6/23 showed mild nonobstructive coronary disease.  Patient was sent to Dr Ali Lowe for consideration of mTEER.   He was evaluated by Dr. Aundra Dubin in the advanced CHF clinic on 03/12/22 who felt he had atrial functional MR. He suspected atrial fibrillation was playing a role in severe MR and that also the severe MR perpetuates the atrial fibrillation by ongoing LA dilation. He did not think he would hold NSR long-term with severe MR and felt that mTEER was indicated. He favored proceeding with mTEER and then trialing amiodarone followed by DCCV. He was started on Farxiga '10mg'$  at that time. He has  follow up with Dr Aundra Dubin on 05/04/22.   He underwent successful mTEER 03/25/22 with 2 XTW clips with residual mild mitral regurgitation. Post op echo with a MV peak gradient at 11.0 mmHg and mean mitral valve gradient at 4.8 mmHg with average heart rate of 77 bpm. He was resumed on Eliquis with the addition of Plavix to be continued x 3 months (06/25/22).  Today he is here alone and reports that he has been doing well since clip placement however continues to have some underlying SOB with exertion. He has refrained from mowing his grass and other strenuous activities until he was seen today. He denies chest pain, LE edema, palpitations, dizziness, bleeding in stool or urine, or syncope. He has some bruising on his upper and lower extremities. Groin site is stable. EKG today shows rate controlled AF. He is being loaded with amiodarone.    Past Medical History:  Diagnosis Date   Arthritis    knees,back   CAD (coronary artery disease) CARDIOLOGIST-  DR HOCHREIN   previous myocardial infarction in 2001 with 90% circumflex lesion treated with PTCA. The LAD had 70-80% stenosis which was treawted with angioplasty. Most recent catheterization in 2003 demonstrated the LAD at 30% mid stenosis, circumflex 60-70% stenosis in  the mid AV grove, and 40% stenosis in the previously stented area. The right coronary artery had 30-40% stenosis in the mid portion.    GERD (gastroesophageal reflux disease)    History of kidney stones    Hypertension    Hypothyroidism    Left ureteral stone    Pre-diabetes    S/P mitral valve clip implantation 03/25/2022   s/p TEER wtih two XTW MitraClips by Dr. Burt Knack and Ali Lowe   Severe mitral regurgitation    Skin cancer    face    Past Surgical History:  Procedure Laterality Date   APPENDECTOMY  as teen   BUBBLE STUDY  12/11/2021   Procedure: BUBBLE STUDY;  Surgeon: Elouise Munroe, MD;  Location: [facility] ENDOSCOPY;  Service: Cardiology;;   CARDIAC CATHETERIZATION   03-29-2000   dr hochrein   Nonobstuctive CAD /   CFX patent stent and LAD diffuse luminal irregularities including mid segment previously been angioplastied   CARDIOVASCULAR STRESS TEST  05-09-2015   dr hochrein   low risk perfusion study/  no reversible ischemia/  normal LV function and wall motion , ef 63%   CATARACT EXTRACTION W/ INTRAOCULAR LENS  IMPLANT, BILATERAL  left 05-06-2015/  right 06-10-2015   CORONARY ANGIOPLASTY WITH STENT PLACEMENT  01-21-2000  dr hochrein   PTCA to pLAD/  PTCA and BMS x1 to mid LCFX/  normal LVSF   CORONARY ANGIOPLASTY WITH STENT PLACEMENT  01-10-2002  dr Lyndel Safe   PTCA and BMS to mid AV LCFX (in-stent restenosis)/  diffuse 30-40% mRCA ,  mLAD diffuse 30%,  normal LVSF, ef 65%   CYSTOSCOPY/RETROGRADE/URETEROSCOPY/STONE EXTRACTION WITH BASKET Left 05/23/2015   Procedure: CYSTOSCOPY, FULGERATION OF PROSTATE,  LEFT URETEROSCOPY, JJ STENT PLACEMENT;  Surgeon: Festus Aloe, MD;  Location: [facility];  Service: Urology;  Laterality: Left;   ELECTROPHYSIOLOGIC STUDY  03-30-2000   dr Caryl Comes   EXTRACORPOREAL SHOCK WAVE LITHOTRIPSY Left 10-30-2012   EXTRACORPOREAL SHOCK WAVE LITHOTRIPSY Right 11/16/2021   Procedure: EXTRACORPOREAL SHOCK WAVE LITHOTRIPSY (ESWL);  Surgeon: Franchot Gallo, MD;  Location: [facility];  Service: Urology;  Laterality: Right;   HOLMIUM LASER APPLICATION Left 80/99/8338   Procedure: WITH HOLMIUM LASER LITHOTRIPSY;  Surgeon: Festus Aloe, MD;  Location: [facility];  Service: Urology;  Laterality: Left;   KNEE ARTHROSCOPY Bilateral x2 right/  left 2015   LUMBAR LAMINECTOMY/DECOMPRESSION MICRODISCECTOMY N/A 09/28/2012   Procedure: L3  -L5 DECOMPRESSION L4 - L5 FUSION WITH INSTRUMENTATION 2 LEVELS;  Surgeon: Melina Schools, MD;  Location: [place];  Service: Orthopedics;  Laterality: N/A;   MITRAL VALVE REPAIR N/A 03/25/2022   Procedure: MITRAL VALVE REPAIR;  Surgeon: Early Osmond, MD;  Location:  [place] CV [facility];  Service: Cardiovascular;  Laterality: N/A;   RIGHT/LEFT HEART CATH AND CORONARY ANGIOGRAPHY N/A 01/27/2022   Procedure: RIGHT/LEFT HEART CATH AND CORONARY ANGIOGRAPHY;  Surgeon: Early Osmond, MD;  Location: [place] CV [facility];  Service: Cardiovascular;  Laterality: N/A;   TEE WITHOUT CARDIOVERSION N/A 12/11/2021   Procedure: TRANSESOPHAGEAL ECHOCARDIOGRAM (TEE);  Surgeon: Elouise Munroe, MD;  Location: [place];  Service: Cardiology;  Laterality: N/A;   TEE WITHOUT CARDIOVERSION N/A 03/25/2022   Procedure: TRANSESOPHAGEAL ECHOCARDIOGRAM (TEE);  Surgeon: Early Osmond, MD;  Location: [place] [facility];  Service: Cardiovascular;  Laterality: N/A;   TONSILLECTOMY  as child   TOTAL KNEE ARTHROPLASTY Left 10/02/2018   Procedure: TOTAL LEFT  KNEE ARTHROPLASTY;  Surgeon: Paralee Cancel, MD;  Location: [facility];  Service: Orthopedics;  Laterality: Left;  90 mins- Ok per Tiffany   TOTAL SHOULDER ARTHROPLASTY Right 01/14/2011   TRANSTHORACIC ECHOCARDIOGRAM  05-21-2015   mild LVH,  ef 55-605,  grade 2 diastolic dysfunction, basal inferior hyperkinesis/  moderate AV calcification sclerosis without stenosis/  trivial MR/  moderate LAE/  mild TR/  mild RAE    Current Medications: Current Meds  Medication Sig   acetaminophen (TYLENOL) 500 MG tablet Take 500 mg by mouth every 6 (six) hours as needed for moderate pain.   amiodarone (PACERONE) 200 MG tablet Take 1 tablet (200 mg total) by mouth as directed. Take '400mg'$  (two tablets) twice a day x 1 week, take '200mg'$  (one tablet) twice a day x 1 week, then take '200mg'$  (one tablet) daily thereafter.   apixaban (ELIQUIS) 5 MG TABS tablet Take 1 tablet (5 mg total) by mouth 2 (two) times daily.   atorvastatin (LIPITOR) 80 MG tablet Take 1 tablet (80 mg total) by mouth at bedtime.   Cholecalciferol (VITAMIN D) 50 MCG (2000 UT) tablet Take 2,000 Units by mouth daily.   clopidogrel (PLAVIX) 75 MG tablet Take 1 tablet (75 mg total) by mouth  daily with breakfast.   Cyanocobalamin (B-12) 5000 MCG CAPS Take 5,000 mcg by mouth daily.   dapagliflozin propanediol (FARXIGA) 10 MG TABS tablet Take 1 tablet (10 mg total) by mouth daily before breakfast.   diclofenac Sodium (VOLTAREN) 1 % GEL Apply 1 Application topically 4 (four) times daily as needed (pain).   ferrous sulfate (FERROUSUL) 325 (65 FE) MG tablet Take 1 tablet (325 mg total) by mouth 3 (three) times daily with meals. (Patient taking differently: Take 325 mg by mouth daily with breakfast. Take 1 tablet by mouth daily. Iron)   folic acid (FOLVITE) 270 MCG tablet Take 800 mcg by mouth daily.    furosemide (LASIX) 20 MG tablet Take 1 tablet (20 mg total) by mouth daily.   levothyroxine (SYNTHROID) 25 MCG tablet Take 25 mcg by mouth daily before breakfast.   metoprolol tartrate (LOPRESSOR) 25 MG tablet Take 1 tablet (25 mg total) by mouth 2 (two) times daily.   niacin 500 MG tablet Take 500 mg by mouth daily.   nitroGLYCERIN (NITROSTAT) 0.4 MG SL tablet Place 0.4 mg under the tongue every 5 (five) minutes as needed for chest pain.   pantoprazole (PROTONIX) 40 MG tablet Take 1 tablet (40 mg total) by mouth daily.     Allergies:   Patient has no known allergies.   Social History   Socioeconomic History   Marital status: Married    Spouse name: Not on file   Number of children: 2   Years of education: Not on file   Highest education level: Not on file  Occupational History   Occupation: retired  Tobacco Use   Smoking status: Former    Packs/day: 1.00    Years: 3.00    Total pack years: 3.00    Types: Cigarettes    Quit date: 09/26/1962    Years since quitting: 59.5   Smokeless tobacco: Never  Vaping Use   Vaping Use: Never used  Substance and Sexual Activity   Alcohol use: No   Drug use: No   Sexual activity: Not Currently  Other Topics Concern   Not on file  Social History Narrative   Lives at home with wife.  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     Social Determinants of  Health   Financial Resource Strain: Not on file  Food Insecurity: Not on file  Transportation Needs: Not on file  Physical Activity: Not on file  Stress: Not on file  Social Connections: Not on file     Family History: The patient's family history includes Heart failure in his mother; Prostate cancer in his father.  ROS:   Please see the history of present illness.    All other systems reviewed and are negative.  EKGs/Labs/Other Studies Reviewed:    The following studies were reviewed today:  Echocardiogram 03/26/22:  Left ventricular ejection fraction, by estimation, is 50 to 55%. The left ventricle has low normal function. The left ventricle demonstrates regional wall motion abnormalities (see scoring diagram/findings for description). Left ventricular diastolic function could not be evaluated. There is moderate hypokinesis of the left ventricular, basal-mid lateral wall. 1. Right ventricular systolic function is normal. The right ventricular size is normal. There is normal pulmonary artery systolic pressure. The estimated right ventricular systolic pressure is 88.4 mmHg. 2. 3. Left atrial size was severely dilated. 4. Right atrial size was severely dilated. Pressure half time 115 ms. The mitral valve is myxomatous. Mild mitral valve regurgitation. Mild mitral stenosis. The mean mitral valve gradient is 4.8 mmHg with average heart rate of 77 bpm. Procedure Date: 03/25/2022. 5. 6. Tricuspid valve regurgitation is mild to moderate. The aortic valve is tricuspid. Aortic valve regurgitation is not visualized. Aortic valve sclerosis is present, with no evidence of aortic valve stenosis. 7. Evidence of atrial level shunting detected by color flow Doppler. There is a small secundum atrial septal defect with predominantly left to right shunting across the atrial septum.  Pressure half time 115 ms. The mitral valve is myxomatous. Mild mitral valve regurgitation. There is a  Mitra-Clip present in the mitral position. Mild mitral valve stenosis. MV peak gradient, 11.0 mmHg. The mean mitral valve gradient is 4.8 mmHg with average heart rate of 77 bpm.   HEART AND VASCULAR CENTER   MULTIDISCIPLINARY HEART TEAM   Date of Procedure:                03/25/2022   Preoperative Diagnosis:      Severe Symptomatic Mitral Regurgitation (Stage D)   Postoperative Diagnosis:    Same    Procedure Performed: Ultrasound-guided right transfemoral venous access Double PreClose right femoral vein Transseptal puncture using Bailess RF needle Mitral valve repair with MitraClip XTW x 2   Surgeon: Lenna Sciara, MD  Co-Surgeon: Sherren Mocha, MD   Echocardiographer: Sanda Klein, MD   Anesthesiologist: Albertha Ghee, MD   Device Implant: Mitraclip XTW, Serial # [id number]                            Mitraclip XTW, Serial [id number]   Procedural Indication: Severe Non-rheumatic Mitral Regurgitation (Stage D)    EKG:  EKG is ordered today.  The ekg ordered today demonstrates AF with SVR with HR 59bpm   Recent Labs: 03/23/2022: ALT 34; B Natriuretic Peptide 884.9 03/31/2022: BUN 36; Creatinine, Ser 1.80; Hemoglobin 13.0; Platelets 119; Potassium 4.3; Sodium 140   Recent Lipid Panel    Component Value Date/Time   CHOL 115 11/26/2013 1044   TRIG 48.0 11/26/2013 1044   HDL 49.70 11/26/2013 1044   CHOLHDL 2 11/26/2013 1044   VLDL 9.6 11/26/2013 1044   LDLCALC 56 11/26/2013 1044   Physical Exam:    VS:  BP 130/70   Pulse (!) 59   Ht '5\' 9"'$  (1.753 m)  Wt 163 lb 9.6 oz (74.2 kg)   SpO2 99%   BMI 24.16 kg/m     Wt Readings from Last 3 Encounters:  03/31/22 163 lb 9.6 oz (74.2 kg)  03/25/22 165 lb (74.8 kg)  03/23/22 166 lb 8 oz (75.5 kg)    General: Well developed, well nourished, NAD Lungs:Clear to ausculation bilaterally. No wheezes, rales, or rhonchi. Breathing is unlabored. Cardiovascular: Irregularly irregular. No murmurs Extremities: No edema. Neuro:  Alert and oriented. No focal deficits. No facial asymmetry. MAE spontaneously. Psych: Responds to questions appropriately with normal affect.    ASSESSMENT/PLAN:    Severe non rheumatic MR: s/p MitraClip with XTW clips x2 with residual mild mitral regurgitation. Post op echo with a MV peak gradient at 11.0 mmHg and mean mitral valve gradient at 4.8 mmHg with average heart rate of 77 bpm. He was resumed on Eliquis with the addition of Plavix to be continued x 3 months (06/25/22). SBE discussed today and RXed with Amoxicillin 2g prior to dental procedures and cleanings. Plan DCCV 9/15 and follow with me 9/18 with echocardiogram.    Persistent atrial fibrillation: Plan per Dr. Aundra Dubin was to start amiodarone with plans for DCCV. He was resumed on home Eliqius post clip with the addition of Plavix. Currently taking amiodarone load '400mg'$  BID x1 week, '200mg'$  BID x1 week followed by '200mg'$  daily. EKG today with AF with SVR HR 59bpm. Asymptomatic although still having some exertional SOB which may be a symptom of his AF. He has been compliant with AC. Plan DCCV 04/16/22 and follow up with myself 04/19/22 with repeat echocardiogram. BMET, CBC today.   Chronic diastolic CHF: Appears euvolemic today. Started on Farxiga prior to clip and has had a bump in Cr to 1.8 from 1.3. Will hold for now and may be able to restart at a later time. May need to hold Lasix as well. Will address at follow up 9/18 and repeat labs.   CKD stage IIIb: Post procedure Cr up to 1.81. Repeat today remains elevated at 1.80. Baseline appears to be in the 1.3-1.4 range. Recently started on Farixga. Lasix '20mg'$  held 3 days after discharge. Will stop [place] for now and repeat labs after cardioversion. May need to hold Lasix as well.    GERD: Nexium was changed to Protonix given potential drug drug interaction with Plavix. Can transition back once Plavix stopped.     Medication Adjustments/Labs and Tests Ordered: Current medicines are reviewed  at length with the patient today.  Concerns regarding medicines are outlined above.  Orders Placed This Encounter  Procedures   Basic metabolic panel   CBC   EKG 12-Lead   No orders of the defined types were placed in this encounter.   Patient Instructions  Medication Instructions:  Your physician recommends that you continue on your current medications as directed. Please refer to the Current Medication list given to you today.  *If you need a refill on your cardiac medications before your next appointment, please call your pharmacy*   Lab Work: TODAY: BMET, CBC If you have labs (blood work) drawn today and your tests are completely normal, you will receive your results only by: [place] (if you have MyChart) OR A paper copy in the mail If you have any lab test that is abnormal or we need to change your treatment, we will call you to review the results.   Testing/Procedures: You are scheduled for a Cardioversion on 04/16/22 with Dr. Audie Box.  Please arrive at the  [employer] ([employer]) at [facility]: [address] [place], [place] [address] at 7:30 am.  DIET: Nothing to eat or drink after midnight except a sip of water with medications (see medication instructions below)  FYI: For your safety, and to allow [place] to monitor your vital signs accurately during the surgery/procedure we request that   if you have artificial nails, gel coating, SNS etc. Please have those removed prior to your surgery/procedure. Not having the nail coverings /polish removed may result in cancellation or delay of your surgery/procedure.   Medication Instructions: Hold LASIX MORNING OF PROCEDURE   Continue your anticoagulant: ELIQUIS You will need to continue your anticoagulant after your procedure until you  are told by your  Provider that it is safe to stop   Labs: TODAY  You must have a responsible person to drive you home and stay in the waiting area during your procedure.  Failure to do so could result in cancellation.  Bring your insurance cards.  *Special Note: Every effort is made to have your procedure done on time. Occasionally there are emergencies that occur at the hospital that may cause delays. Please be patient if a delay does occur.     Follow-Up: At [facility], you and your health needs are our priority.  As part of our continuing mission to provide you with exceptional heart care, we have created designated Provider Care Teams.  These Care Teams include your primary Cardiologist (physician) and Advanced Practice Providers (APPs -  Physician Assistants and Nurse Practitioners) who all work together to provide you with the care you need, when you need it.  We recommend signing up for the patient portal called "MyChart".  Sign up information is provided on this After Visit Summary.  MyChart is used to connect with patients for Virtual Visits (Telemedicine).  Patients are able to view lab/test results, encounter notes, upcoming appointments, etc.  Non-urgent messages can be sent to your provider as well.   To learn more about what you can do with MyChart, go to [email or web address].    Your next appointment:   KEEPS SCHEDULED FOLLOW-UP  Important Information About Sugar         Signed, Kathyrn Drown, NP  03/31/2022 2:39 PM    [place]

## 2022-03-29 ENCOUNTER — Telehealth: Payer: Self-pay | Admitting: Physician Assistant

## 2022-03-29 NOTE — Telephone Encounter (Signed)
  HEART AND VASCULAR CENTER   MULTIDISCIPLINARY HEART VALVE TEAM   Attempted TOC call.   Angelena Form PA-C  [facility]

## 2022-03-30 NOTE — Telephone Encounter (Signed)
  HEART AND VASCULAR CENTER    Attempted to reach patient for [facility] call. Left message to call back.

## 2022-03-31 ENCOUNTER — Other Ambulatory Visit: Payer: Self-pay | Admitting: Cardiology

## 2022-03-31 ENCOUNTER — Ambulatory Visit: Payer: Medicare HMO | Attending: Cardiology | Admitting: Cardiology

## 2022-03-31 VITALS — BP 130/70 | HR 59 | Ht 69.0 in | Wt 163.6 lb

## 2022-03-31 DIAGNOSIS — E785 Hyperlipidemia, unspecified: Secondary | ICD-10-CM

## 2022-03-31 DIAGNOSIS — Z95818 Presence of other cardiac implants and grafts: Secondary | ICD-10-CM | POA: Diagnosis not present

## 2022-03-31 DIAGNOSIS — I482 Chronic atrial fibrillation, unspecified: Secondary | ICD-10-CM

## 2022-03-31 DIAGNOSIS — I340 Nonrheumatic mitral (valve) insufficiency: Secondary | ICD-10-CM

## 2022-03-31 DIAGNOSIS — I10 Essential (primary) hypertension: Secondary | ICD-10-CM

## 2022-03-31 DIAGNOSIS — I4891 Unspecified atrial fibrillation: Secondary | ICD-10-CM | POA: Diagnosis not present

## 2022-03-31 DIAGNOSIS — I4819 Other persistent atrial fibrillation: Secondary | ICD-10-CM | POA: Diagnosis not present

## 2022-03-31 DIAGNOSIS — Z9889 Other specified postprocedural states: Secondary | ICD-10-CM

## 2022-03-31 LAB — CBC
Hematocrit: 38.4 % (ref 37.5–51.0)
Hemoglobin: 13 g/dL (ref 13.0–17.7)
MCH: 30.8 pg (ref 26.6–33.0)
MCHC: 33.9 g/dL (ref 31.5–35.7)
MCV: 91 fL (ref 79–97)
Platelets: 119 10*3/uL — ABNORMAL LOW (ref 150–450)
RBC: 4.22 x10E6/uL (ref 4.14–5.80)
RDW: 16.5 % — ABNORMAL HIGH (ref 11.6–15.4)
WBC: 8.6 10*3/uL (ref 3.4–10.8)

## 2022-03-31 LAB — BASIC METABOLIC PANEL
BUN/Creatinine Ratio: 20 (ref 10–24)
BUN: 36 mg/dL — ABNORMAL HIGH (ref 8–27)
CO2: 29 mmol/L (ref 20–29)
Calcium: 9.6 mg/dL (ref 8.6–10.2)
Chloride: 106 mmol/L (ref 96–106)
Creatinine, Ser: 1.8 mg/dL — ABNORMAL HIGH (ref 0.76–1.27)
Glucose: 99 mg/dL (ref 70–99)
Potassium: 4.3 mmol/L (ref 3.5–5.2)
Sodium: 140 mmol/L (ref 134–144)
eGFR: 37 mL/min/{1.73_m2} — ABNORMAL LOW (ref >59)

## 2022-03-31 MED ORDER — AMOXICILLIN 500 MG PO CAPS: 2000.0000 mg | ORAL_CAPSULE | ORAL | 12 refills | Status: DC

## 2022-03-31 NOTE — Patient Instructions (Addendum)
Medication Instructions:  Your physician recommends that you continue on your current medications as directed. Please refer to the Current Medication list given to you today.  *If you need a refill on your cardiac medications before your next appointment, please call your pharmacy*   Lab Work: TODAY: BMET, CBC If you have labs (blood work) drawn today and your tests are completely normal, you will receive your results only by: [place] (if you have MyChart) OR A paper copy in the mail If you have any lab test that is abnormal or we need to change your treatment, we will call you to review the results.   Testing/Procedures: You are scheduled for a Cardioversion on 04/16/22 with Dr. Audie Box.  Please arrive at the [facility] (Main Entrance A) at [facility]: [address] [place], [place] [address] at 7:30 am.  DIET: Nothing to eat or drink after midnight except a sip of water with medications (see medication instructions below)  FYI: For your safety, and to allow [place] to monitor your vital signs accurately during the surgery/procedure we request that   if you have artificial nails, gel coating, SNS etc. Please have those removed prior to your surgery/procedure. Not having the nail coverings /polish removed may result in cancellation or delay of your surgery/procedure.   Medication Instructions: Hold LASIX MORNING OF PROCEDURE   Continue your anticoagulant: ELIQUIS You will need to continue your anticoagulant after your procedure until you  are told by your  Provider that it is safe to stop   Labs: TODAY  You must have a responsible person to drive you home and stay in the waiting area during your procedure. Failure to do so could result in cancellation.  Bring your insurance cards.  *Special Note: Every effort is made to have your procedure done on time. Occasionally there are emergencies that occur at the hospital that may cause delays. Please be patient if a delay does  occur.     Follow-Up: At [facility], you and your health needs are our priority.  As part of our continuing mission to provide you with exceptional heart care, we have created designated Provider Care Teams.  These Care Teams include your primary Cardiologist (physician) and Advanced Practice Providers (APPs -  Physician Assistants and Nurse Practitioners) who all work together to provide you with the care you need, when you need it.  We recommend signing up for the patient portal called "MyChart".  Sign up information is provided on this After Visit Summary.  MyChart is used to connect with patients for Virtual Visits (Telemedicine).  Patients are able to view lab/test results, encounter notes, upcoming appointments, etc.  Non-urgent messages can be sent to your provider as well.   To learn more about what you can do with MyChart, go to [email or web address].    Your next appointment:   KEEPS SCHEDULED FOLLOW-UP  Important Information About Sugar

## 2022-03-31 NOTE — Telephone Encounter (Signed)
The patient was seen in clinic today by [facility].

## 2022-04-02 ENCOUNTER — Ambulatory Visit: Payer: Medicare HMO | Admitting: Cardiology

## 2022-04-09 ENCOUNTER — Encounter (HOSPITAL_COMMUNITY): Payer: Self-pay | Admitting: Cardiovascular Disease

## 2022-04-16 ENCOUNTER — Other Ambulatory Visit: Payer: Self-pay

## 2022-04-16 ENCOUNTER — Encounter (HOSPITAL_COMMUNITY): Payer: Self-pay | Admitting: Cardiovascular Disease

## 2022-04-16 ENCOUNTER — Ambulatory Visit (HOSPITAL_COMMUNITY): Payer: Medicare HMO | Admitting: Certified Registered"

## 2022-04-16 ENCOUNTER — Encounter (HOSPITAL_COMMUNITY)
Admission: RE | Disposition: A | Discharge status: Home / Self Care | Payer: Self-pay | Source: Home / Self Care | Attending: Cardiovascular Disease

## 2022-04-16 ENCOUNTER — Ambulatory Visit (HOSPITAL_COMMUNITY)
Admission: RE | Admit: 2022-04-16 | Discharge: 2022-04-16 | Disposition: A | Discharge status: Home / Self Care | Payer: Medicare HMO | Attending: Cardiovascular Disease | Admitting: Cardiovascular Disease

## 2022-04-16 ENCOUNTER — Ambulatory Visit (HOSPITAL_BASED_OUTPATIENT_CLINIC_OR_DEPARTMENT_OTHER): Payer: Medicare HMO | Admitting: Certified Registered"

## 2022-04-16 DIAGNOSIS — I10 Essential (primary) hypertension: Secondary | ICD-10-CM

## 2022-04-16 DIAGNOSIS — I2510 Atherosclerotic heart disease of native coronary artery without angina pectoris: Secondary | ICD-10-CM

## 2022-04-16 DIAGNOSIS — I4891 Unspecified atrial fibrillation: Secondary | ICD-10-CM | POA: Diagnosis not present

## 2022-04-16 DIAGNOSIS — I130 Hypertensive heart and chronic kidney disease with heart failure and stage 1 through stage 4 chronic kidney disease, or unspecified chronic kidney disease: Secondary | ICD-10-CM | POA: Insufficient documentation

## 2022-04-16 DIAGNOSIS — E785 Hyperlipidemia, unspecified: Secondary | ICD-10-CM | POA: Insufficient documentation

## 2022-04-16 DIAGNOSIS — N1832 Chronic kidney disease, stage 3b: Secondary | ICD-10-CM | POA: Diagnosis not present

## 2022-04-16 DIAGNOSIS — I4819 Other persistent atrial fibrillation: Secondary | ICD-10-CM | POA: Diagnosis not present

## 2022-04-16 DIAGNOSIS — Z87891 Personal history of nicotine dependence: Secondary | ICD-10-CM | POA: Insufficient documentation

## 2022-04-16 DIAGNOSIS — I482 Chronic atrial fibrillation, unspecified: Secondary | ICD-10-CM

## 2022-04-16 DIAGNOSIS — Z7902 Long term (current) use of antithrombotics/antiplatelets: Secondary | ICD-10-CM | POA: Insufficient documentation

## 2022-04-16 DIAGNOSIS — K219 Gastro-esophageal reflux disease without esophagitis: Secondary | ICD-10-CM | POA: Diagnosis not present

## 2022-04-16 DIAGNOSIS — I252 Old myocardial infarction: Secondary | ICD-10-CM | POA: Diagnosis not present

## 2022-04-16 DIAGNOSIS — Z79899 Other long term (current) drug therapy: Secondary | ICD-10-CM | POA: Diagnosis not present

## 2022-04-16 DIAGNOSIS — Z7901 Long term (current) use of anticoagulants: Secondary | ICD-10-CM | POA: Diagnosis not present

## 2022-04-16 DIAGNOSIS — I5032 Chronic diastolic (congestive) heart failure: Secondary | ICD-10-CM | POA: Insufficient documentation

## 2022-04-16 HISTORY — PX: CARDIOVERSION: SHX1299

## 2022-04-16 LAB — POCT I-STAT, CHEM 8
BUN: 42 mg/dL — ABNORMAL HIGH (ref 8–23)
Calcium, Ion: 1.17 mmol/L (ref 1.15–1.40)
Chloride: 108 mmol/L (ref 98–111)
Creatinine, Ser: 2 mg/dL — ABNORMAL HIGH (ref 0.61–1.24)
Glucose, Bld: 98 mg/dL (ref 70–99)
HCT: 38 % — ABNORMAL LOW (ref 39.0–52.0)
Hemoglobin: 12.9 g/dL — ABNORMAL LOW (ref 13.0–17.0)
Potassium: 4.3 mmol/L (ref 3.5–5.1)
Sodium: 141 mmol/L (ref 135–145)
TCO2: 27 mmol/L (ref 22–32)

## 2022-04-16 SURGERY — CARDIOVERSION
Anesthesia: General

## 2022-04-16 MED ORDER — PROPOFOL 10 MG/ML IV BOLUS
INTRAVENOUS | Status: DC | PRN
  Administered 2022-04-16: 50 mg via INTRAVENOUS

## 2022-04-16 MED ORDER — LIDOCAINE 2% (20 MG/ML) 5 ML SYRINGE
INTRAMUSCULAR | Status: DC | PRN
  Administered 2022-04-16: 50 mg via INTRAVENOUS

## 2022-04-16 MED ORDER — SODIUM CHLORIDE 0.9 % IV SOLN: INTRAVENOUS | Status: DC

## 2022-04-16 NOTE — Discharge Instructions (Signed)

## 2022-04-16 NOTE — Transfer of Care (Signed)
Immediate Anesthesia Transfer of Care Note  Patient: [name]  Procedure(s) Performed: CARDIOVERSION  Patient Location: Endoscopy Unit  Anesthesia Type:General  Level of Consciousness: sedated  Airway & Oxygen Therapy: Patient connected to nasal cannula oxygen  Post-op Assessment: Post -op Vital signs reviewed and stable  Post vital signs: stable  Last Vitals:  Vitals Value Taken Time  BP    Temp    Pulse    Resp    SpO2      Last Pain:  Vitals:   04/16/22 0742  TempSrc: Temporal  PainSc: 0-No pain         Complications: No notable events documented.

## 2022-04-16 NOTE — CV Procedure (Signed)
   DIRECT CURRENT CARDIOVERSION  NAME:  [name]    MRN: [id number] DOB:  1935-10-10    ADMIT DATE: 04/16/2022  Indication:  Symptomatic atrial fibrillation   Procedure Note:  The patient signed informed consent.  They have had had therapeutic anticoagulation with eliquis greater than 3 weeks.  Anesthesia was administered by Dr. Laveda Norman.  Adequate airway was maintained throughout and vital followed per protocol.  They were cardioverted x x with 200J of biphasic synchronized energy.  They converted to NSR.  There were no apparent complications.  The patient had normal neuro status and respiratory status post procedure with vitals stable as recorded elsewhere.    Follow up: They will continue on current medical therapy and follow up with cardiology as scheduled.  [place] T. Audie Box, MD, [place]  [address], [place], [place] [address] [phone]  8:13 AM

## 2022-04-16 NOTE — Anesthesia Preprocedure Evaluation (Signed)
Anesthesia Evaluation  Patient identified by MRN, date of birth, ID band Patient awake    Reviewed: Allergy & Precautions, NPO status , Patient's Chart, lab work & pertinent test results  Airway Mallampati: III  TM Distance: >3 FB Neck ROM: Full    Dental no notable dental hx.    Pulmonary former smoker,    Pulmonary exam normal        Cardiovascular hypertension, + CAD, + Past MI and + Cardiac Stents  Normal cardiovascular exam+ dysrhythmias Atrial Fibrillation      Neuro/Psych negative neurological ROS  negative psych ROS   GI/Hepatic negative GI ROS, Neg liver ROS,   Endo/Other  Hypothyroidism   Renal/GU negative Renal ROS     Musculoskeletal  (+) Arthritis ,   Abdominal   Peds  Hematology  (+) Blood dyscrasia, ,   Anesthesia Other Findings A-FIB  Reproductive/Obstetrics                             Anesthesia Physical Anesthesia Plan  ASA: 3  Anesthesia Plan: General   Post-op Pain Management:    Induction: Intravenous  PONV Risk Score and Plan: 2 and Propofol infusion and Treatment may vary due to age or medical condition  Airway Management Planned: Mask  Additional Equipment:   Intra-op Plan:   Post-operative Plan:   Informed Consent: I have reviewed the patients History and Physical, chart, labs and discussed the procedure including the risks, benefits and alternatives for the proposed anesthesia with the patient or authorized representative who has indicated his/her understanding and acceptance.     Dental advisory given  Plan Discussed with: CRNA  Anesthesia Plan Comments:         Anesthesia Quick Evaluation

## 2022-04-16 NOTE — Anesthesia Procedure Notes (Signed)
Procedure Name: General with mask airway Date/Time: 04/16/2022 8:16 AM  Performed by: Lavell Luster, CRNAPre-anesthesia Checklist: Patient identified, Emergency Drugs available, Suction available, Patient being monitored and Timeout performed Patient Re-evaluated:Patient Re-evaluated prior to induction Oxygen Delivery Method: Non-rebreather mask Preoxygenation: Pre-oxygenation with 100% oxygen Induction Type: IV induction Ventilation: Mask ventilation without difficulty Placement Confirmation: breath sounds checked- equal and bilateral and positive ETCO2 Dental Injury: Teeth and Oropharynx as per pre-operative assessment

## 2022-04-16 NOTE — Interval H&P Note (Signed)
History and Physical Interval Note:  04/16/2022 8:07 AM  [name]  has presented today for surgery, with the diagnosis of AFIB.  The various methods of treatment have been discussed with the patient and family. After consideration of risks, benefits and other options for treatment, the patient has consented to  Procedure(s): CARDIOVERSION (N/A) as a surgical intervention.  The patient's history has been reviewed, patient examined, no change in status, stable for surgery.  I have reviewed the patient's chart and labs.  Questions were answered to the patient's satisfaction.     NPO for DCCV. On eliquis. No missed doses >3 weeks.   [place] T. Audie Box, MD, [place]  [address], [place], [place] [address] [phone]  8:07 AM

## 2022-04-18 ENCOUNTER — Encounter (HOSPITAL_COMMUNITY): Payer: Self-pay | Admitting: Cardiovascular Disease

## 2022-04-18 NOTE — Anesthesia Postprocedure Evaluation (Signed)
Anesthesia Post Note  Patient: [name]  Procedure(s) Performed: CARDIOVERSION     Patient location during evaluation: Endoscopy Anesthesia Type: General Level of consciousness: awake Pain management: pain level controlled Vital Signs Assessment: post-procedure vital signs reviewed and stable Respiratory status: spontaneous breathing, nonlabored ventilation, respiratory function stable and patient connected to nasal cannula oxygen Cardiovascular status: blood pressure returned to baseline and stable Postop Assessment: no apparent nausea or vomiting Anesthetic complications: no   No notable events documented.  Last Vitals:  Vitals:   04/16/22 0830 04/16/22 0840  BP: (!) 111/58 (!) 118/56  Pulse: (!) 46 (!) 47  Resp: 14 15  Temp:    SpO2: 96% 96%    Last Pain:  Vitals:   04/16/22 0840  TempSrc:   PainSc: 0-No pain                 [name] [name]

## 2022-04-19 ENCOUNTER — Ambulatory Visit: Payer: Medicare HMO

## 2022-04-19 ENCOUNTER — Other Ambulatory Visit (HOSPITAL_COMMUNITY): Payer: Medicare HMO

## 2022-04-23 NOTE — Progress Notes (Signed)
[name]                                     Cardiology Office Note:    Date:  08/19/2022   ID:  [name], DOB 1936/06/09, MRN [id number]  PCP:  Shirline Frees, MD  [facility] Cardiologist:  Minus Breeding, MD / Dr. Ali Lowe, MD and Dr. Burt Knack, MD (TEER) [facility] Electrophysiologist:  None   Referring MD: Shirline Frees, MD   Chief Complaint  Patient presents with   Follow-up    S/p TEER    History of Present Illness:    [name] is a 86 y.o. male with a hx of  HTN, HLD, CAD s/p remote PCI, persistent atrial fibrillation on apixaban, CKD stage IIIb, and severe symptomatic mitral regurgitation who presented to [facility] on [facility]/25/22 for planned mTEER and is being seen today for post procedure follow up.    Mr. [name] has history of CAD with MI and POBA to LCx and LAD in 2001 as well as BMS to LCx in 2003.  He reported exertional dyspnea beginning last year that he attributed to the after-effects of COVID-19 x 2 episodes. He saw Dr. Percival Spanish in 11/22/21 as a pre-op for lithotripsy and was noted to be in atrial fibrillation (first time that AF had been recorded).  Echo was then done, showing preserved LV EF but severe MR. TEE in 5/23 showed EF 60-65%, mildly decreased RV systolic function, severe LAE, mild bileaflet myxomatous MV prolapse with severe MR (central).  Cath in 6/23 showed mild nonobstructive coronary disease.  Patient was sent to Dr Ali Lowe for consideration of mTEER.   He was evaluated by Dr. Aundra Dubin in the advanced CHF clinic on 03/12/22 who felt he had atrial functional MR. He suspected atrial fibrillation was playing a role in severe MR and that also the severe MR perpetuates the atrial fibrillation by ongoing LA dilation. He did not think he would hold NSR long-term with severe MR and felt that mTEER was indicated. He favored proceeding with mTEER and then trialing amiodarone followed by DCCV. He was started  on Farxiga '10mg'$  at that time. He has follow up with Dr Aundra Dubin on 05/04/22.   He underwent successful mTEER 03/25/22 with 2 XTW clips with residual mild mitral regurgitation. Post op echo with a MV peak gradient at 11.0 mmHg and mean mitral valve gradient at 4.8 mmHg with average heart rate of 77 bpm. He was resumed on Eliquis with the addition of Plavix to be continued x 3 months (06/25/22).   He was last seen by myself 03/31/22 for post MitraClip follow up. Today he is here and reports that he has been doing well with no issues to complain about. He was previously struggling with fatigue however this has improved. He denies chest pain, Brandt edema, palpitations, dizziness, bleeding in stool or urine, or syncope.   Echocardiogram today with stable clip placement with moderate mitral valve regurgitation with pulmonary vein blunting of systolic flow (right sided PV assessed) and a mean gradient at 70mHg.    Past Medical History:  Diagnosis Date   Arthritis    knees,back   CAD (coronary artery disease) CARDIOLOGIST-  DR HOCHREIN   previous myocardial infarction in 2001 with 90% circumflex lesion treated with PTCA. The LAD had 70-80% stenosis which was treawted with angioplasty. Most recent catheterization in  2003 demonstrated the LAD at 30% mid stenosis, circumflex 60-70% stenosis in the mid AV grove, and 40% stenosis in the previously stented area. The right coronary artery had 30-40% stenosis in the mid portion.    GERD (gastroesophageal reflux disease)    History of kidney stones    Hypertension    Hypothyroidism    Left ureteral stone    Pre-diabetes    S/P mitral valve clip implantation 03/25/2022   s/p TEER wtih two XTW MitraClips by Dr. Burt Knack and Ali Lowe   Severe mitral regurgitation    Skin cancer    face    Past Surgical History:  Procedure Laterality Date   APPENDECTOMY  as teen   BUBBLE STUDY  12/11/2021   Procedure: BUBBLE STUDY;  Surgeon: Elouise Munroe, MD;  Location: [facility]  ENDOSCOPY;  Service: Cardiology;;   CARDIAC CATHETERIZATION  03-29-2000   dr hochrein   Nonobstuctive CAD /   CFX patent stent and LAD diffuse luminal irregularities including mid segment previously been angioplastied   CARDIOVASCULAR STRESS TEST  05-09-2015   dr hochrein   low risk perfusion study/  no reversible ischemia/  normal LV function and wall motion , ef 63%   CARDIOVERSION N/A 04/16/2022   Procedure: CARDIOVERSION;  Surgeon: Geralynn Rile, MD;  Location: [place];  Service: Cardiovascular;  Laterality: N/A;   CATARACT EXTRACTION W/ INTRAOCULAR LENS  IMPLANT, BILATERAL  left 05-06-2015/  right 06-10-2015   CORONARY ANGIOPLASTY WITH STENT PLACEMENT  01-21-2000  dr hochrein   PTCA to pLAD/  PTCA and BMS x1 to mid LCFX/  normal LVSF   CORONARY ANGIOPLASTY WITH STENT PLACEMENT  01-10-2002  dr Lyndel Safe   PTCA and BMS to mid AV LCFX (in-stent restenosis)/  diffuse 30-40% mRCA ,  mLAD diffuse 30%,  normal LVSF, ef 65%   CYSTOSCOPY/RETROGRADE/URETEROSCOPY/STONE EXTRACTION WITH BASKET Left 05/23/2015   Procedure: CYSTOSCOPY, FULGERATION OF PROSTATE,  LEFT URETEROSCOPY, JJ STENT PLACEMENT;  Surgeon: Festus Aloe, MD;  Location: [facility];  Service: Urology;  Laterality: Left;   ELECTROPHYSIOLOGIC STUDY  03-30-2000   dr Caryl Comes   EXTRACORPOREAL SHOCK WAVE LITHOTRIPSY Left 10-30-2012   EXTRACORPOREAL SHOCK WAVE LITHOTRIPSY Right 11/16/2021   Procedure: EXTRACORPOREAL SHOCK WAVE LITHOTRIPSY (ESWL);  Surgeon: Franchot Gallo, MD;  Location: [facility];  Service: Urology;  Laterality: Right;   HOLMIUM LASER APPLICATION Left 94/70/9628   Procedure: WITH HOLMIUM LASER LITHOTRIPSY;  Surgeon: Festus Aloe, MD;  Location: [facility];  Service: Urology;  Laterality: Left;   KNEE ARTHROSCOPY Bilateral x2 right/  left 2015   LUMBAR LAMINECTOMY/DECOMPRESSION MICRODISCECTOMY N/A 09/28/2012   Procedure: L3  -L5 DECOMPRESSION L4 - L5 FUSION WITH  INSTRUMENTATION 2 LEVELS;  Surgeon: Melina Schools, MD;  Location: [place];  Service: Orthopedics;  Laterality: N/A;   MITRAL VALVE REPAIR N/A 03/25/2022   Procedure: MITRAL VALVE REPAIR;  Surgeon: Early Osmond, MD;  Location: [place] CV [facility];  Service: Cardiovascular;  Laterality: N/A;   RIGHT/LEFT HEART CATH AND CORONARY ANGIOGRAPHY N/A 01/27/2022   Procedure: RIGHT/LEFT HEART CATH AND CORONARY ANGIOGRAPHY;  Surgeon: Early Osmond, MD;  Location: [place] [facility];  Service: Cardiovascular;  Laterality: N/A;   TEE WITHOUT CARDIOVERSION N/A 12/11/2021   Procedure: TRANSESOPHAGEAL ECHOCARDIOGRAM (TEE);  Surgeon: Elouise Munroe, MD;  Location: [place];  Service: Cardiology;  Laterality: N/A;   TEE WITHOUT CARDIOVERSION N/A 03/25/2022   Procedure: TRANSESOPHAGEAL ECHOCARDIOGRAM (TEE);  Surgeon: Early Osmond, MD;  Location: [place]  CV [facility];  Service: Cardiovascular;  Laterality: N/A;   TONSILLECTOMY  as child   TOTAL KNEE ARTHROPLASTY Left 10/02/2018   Procedure: TOTAL LEFT  KNEE ARTHROPLASTY;  Surgeon: Paralee Cancel, MD;  Location: [facility];  Service: Orthopedics;  Laterality: Left;  90 mins- Ok per Tiffany   TOTAL SHOULDER ARTHROPLASTY Right 01/14/2011   TRANSTHORACIC ECHOCARDIOGRAM  05-21-2015   mild LVH,  ef 55-605,  grade 2 diastolic dysfunction, basal inferior hyperkinesis/  moderate AV calcification sclerosis without stenosis/  trivial MR/  moderate LAE/  mild TR/  mild RAE    Current Medications: Current Meds  Medication Sig   acetaminophen (TYLENOL) 500 MG tablet Take 500 mg by mouth every 6 (six) hours as needed for moderate pain.   amoxicillin (AMOXIL) 500 MG capsule Take 4 capsules (2,000 mg total) by mouth as directed. Take 4 tablets 1 hour prior to dental work, including cleanings.   atorvastatin (LIPITOR) 80 MG tablet Take 1 tablet (80 mg total) by mouth at bedtime.   Cholecalciferol (VITAMIN D) 50 MCG (2000 UT) tablet Take 2,000 Units by mouth daily.    Cyanocobalamin (B-12) 5000 MCG CAPS Take 5,000 mcg by mouth daily.   folic acid (FOLVITE) 161 MCG tablet Take 800 mcg by mouth daily.    levothyroxine (SYNTHROID) 25 MCG tablet Take 25 mcg by mouth daily before breakfast.   niacin 500 MG tablet Take 500 mg by mouth daily.   nitroGLYCERIN (NITROSTAT) 0.4 MG SL tablet Place 0.4 mg under the tongue every 5 (five) minutes as needed for chest pain.   [DISCONTINUED] amiodarone (PACERONE) 200 MG tablet Take 1 tablet (200 mg total) by mouth as directed. Take '400mg'$  (two tablets) twice a day x 1 week, take '200mg'$  (one tablet) twice a day x 1 week, then take '200mg'$  (one tablet) daily thereafter.   [DISCONTINUED] apixaban (ELIQUIS) 5 MG TABS tablet Take 1 tablet (5 mg total) by mouth 2 (two) times daily.   [DISCONTINUED] clopidogrel (PLAVIX) 75 MG tablet Take 1 tablet (75 mg total) by mouth daily with breakfast.   [DISCONTINUED] dapagliflozin propanediol (FARXIGA) 10 MG TABS tablet Take 1 tablet (10 mg total) by mouth daily before breakfast.   [DISCONTINUED] diclofenac Sodium (VOLTAREN) 1 % GEL Apply 1 Application topically 4 (four) times daily as needed (pain).   [DISCONTINUED] ferrous sulfate (FERROUSUL) 325 (65 FE) MG tablet Take 1 tablet (325 mg total) by mouth 3 (three) times daily with meals. (Patient taking differently: Take 325 mg by mouth daily with breakfast. Take 1 tablet by mouth daily. Iron)   [DISCONTINUED] furosemide (LASIX) 20 MG tablet Take 1 tablet (20 mg total) by mouth daily.   [DISCONTINUED] metoprolol tartrate (LOPRESSOR) 25 MG tablet Take 0.5 tablets (12.5 mg total) by mouth 2 (two) times daily.   [DISCONTINUED] pantoprazole (PROTONIX) 40 MG tablet Take 1 tablet (40 mg total) by mouth daily.     Allergies:   Patient has no known allergies.   Social History   Socioeconomic History   Marital status: Married    Spouse name: Not on file   Number of children: 2   Years of education: Not on file   Highest education level: Not on file   Occupational History   Occupation: retired  Tobacco Use   Smoking status: Former    Packs/day: 1.00    Years: 3.00    Total pack years: 3.00    Types: Cigarettes    Quit date: 09/26/1962    Years since quitting: 51.9  Smokeless tobacco: Never  Vaping Use   Vaping Use: Never used  Substance and Sexual Activity   Alcohol use: No   Drug use: No   Sexual activity: Not Currently  Other Topics Concern   Not on file  Social History Narrative   Lives at home with wife.  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     Social Determinants of Health   Financial Resource Strain: Not on file  Food Insecurity: Not on file  Transportation Needs: Not on file  Physical Activity: Not on file  Stress: Not on file  Social Connections: Not on file     Family History: The patient's family history includes Heart failure in his mother; Prostate cancer in his father.  ROS:   Please see the history of present illness.    All other systems reviewed and are negative.  EKGs/Labs/Other Studies Reviewed:    The following studies were reviewed today:  Echocardiogram 04/26/22:   1. Left ventricular ejection fraction, by estimation, is 50 to 55%. The  left ventricle has low normal function. The left ventricle has no regional  wall motion abnormalities. Left ventricular diastolic parameters are  indeterminate.   2. Right ventricular systolic function is normal. The right ventricular  size is normal.   3. The mitral valve has been replaed by two XTW Mitra-Clips both medial  and lateral to A2-P2. Clips without excessive motion. Moderate mitral  valve regurgitation with pulmonary vein blunting of systolic flow (right  sided PV assessed). Jets are medial  and lateral to clips. MR best appreciated in apical views. Mean gradient  of 5 mm Hg. Unable to planimetry residual orifice   4. The aortic valve is tricuspid. There is mild calcification of the  aortic valve. There is mild thickening of the aortic valve. Aortic valve   regurgitation is not visualized. In the setting of decrease LV stroke  volume index and DVI 04, suspect mild  aortic valve stenosis.   5. Left atrial size was moderately dilated.   6. Evidence of atrial level shunting detected by color flow Doppler.   7. Tricuspid valve regurgitation is mild to moderate.    Echocardiogram 03/26/22:   Left ventricular ejection fraction, by estimation, is 50 to 55%. The left ventricle has low normal function. The left ventricle demonstrates regional wall motion abnormalities (see scoring diagram/findings for description). Left ventricular diastolic function could not be evaluated. There is moderate hypokinesis of the left ventricular, basal-mid lateral wall. 1. Right ventricular systolic function is normal. The right ventricular size is normal. There is normal pulmonary artery systolic pressure. The estimated right ventricular systolic pressure is 73.5 mmHg. 2. 3. Left atrial size was severely dilated. 4. Right atrial size was severely dilated. Pressure half time 115 ms. The mitral valve is myxomatous. Mild mitral valve regurgitation. Mild mitral stenosis. The mean mitral valve gradient is 4.8 mmHg with average heart rate of 77 bpm. Procedure Date: 03/25/2022. 5. 6. Tricuspid valve regurgitation is mild to moderate. The aortic valve is tricuspid. Aortic valve regurgitation is not visualized. Aortic valve sclerosis is present, with no evidence of aortic valve stenosis. 7. Evidence of atrial level shunting detected by color flow Doppler. There is a small secundum atrial septal defect with predominantly left to right shunting across the atrial septum.   Pressure half time 115 ms. The mitral valve is myxomatous. Mild mitral valve regurgitation. There is a Mitra-Clip present in the mitral position. Mild mitral valve stenosis. MV peak gradient, 11.0 mmHg. The mean mitral valve  gradient is 4.8 mmHg with average heart rate of 77 bpm.     [facility]   MULTIDISCIPLINARY HEART TEAM   Date of Procedure:                03/25/2022   Preoperative Diagnosis:      Severe Symptomatic Mitral Regurgitation (Stage D)   Postoperative Diagnosis:    Same    Procedure Performed: Ultrasound-guided right transfemoral venous access Double PreClose right femoral vein Transseptal puncture using Bailess RF needle Mitral valve repair with MitraClip XTW x 2   Surgeon: Lenna Sciara, MD  Co-Surgeon: Sherren Mocha, MD   Echocardiographer: Sanda Klein, MD   Anesthesiologist: Albertha Ghee, MD   Device Implant: Mitraclip XTW, Serial # [id number]                            Mitraclip XTW, Serial [id number]   Procedural Indication: Severe Non-rheumatic Mitral Regurgitation (Stage D)    EKG:  EKG is not ordered today.    Recent Labs: 05/04/2022: ALT 27; Hemoglobin 13.5; Platelets 179; TSH 5.714 06/04/2022: B Natriuretic Peptide 356.1; BUN 18; Creatinine, Ser 1.83; Potassium 3.7; Sodium 139   Recent Lipid Panel    Component Value Date/Time   CHOL 115 11/26/2013 1044   TRIG 48.0 11/26/2013 1044   HDL 49.70 11/26/2013 1044   CHOLHDL 2 11/26/2013 1044   VLDL 9.6 11/26/2013 1044   LDLCALC 56 11/26/2013 1044   Physical Exam:    VS:  BP 130/70 (BP Location: Left Arm, Patient Position: Sitting, Cuff Size: Normal)   Pulse 89   Ht '5\' 9"'$  (1.753 m)   Wt 162 lb (73.5 kg)   SpO2 96%   BMI 23.92 kg/m     Wt Readings from Last 3 Encounters:  06/04/22 162 lb 9.6 oz (73.8 kg)  05/04/22 160 lb (72.6 kg)  04/26/22 162 lb (73.5 kg)    General: Well developed, well nourished, NAD Lungs:Clear to ausculation bilaterally. Breathing is unlabored. Cardiovascular: Irregularly irregular. + murmur Extremities: No edema.  Neuro: Alert and oriented. No focal deficits. No facial asymmetry. MAE spontaneously. Psych: Responds to questions appropriately with normal affect.    ASSESSMENT/PLAN:    Severe non rheumatic MR: Patient doping well  with NYHA class I symptoms s/p MitraClip with XTW clips x2 with residual mild mitral regurgitation. Post op echo with a MV peak gradient at 11.0 mmHg and mean mitral valve gradient at 4.8 mmHg with average heart rate of 77 bpm. He was resumed on Eliquis with the addition of Plavix to be continued x 3 months (06/25/22). SBE discussed. Echo today with stable clip placement and moderate MR with pulmonary vein blunting of systolic flow (right sided PV assessed) and mean gradient at 58mHg.   Persistent atrial fibrillation: Resumed on home Eliqius post clip with the addition of Plavix. Loaded with amiodarone and has been continued on '200mg'$  daily. Asymptomatic. Continue current regimen.    Chronic diastolic CHF: Appear euvolemic today. Continue to follow closely with AHF.   CKD stage IIIb: Baseline Cr runs in the 1.8-2.0 range.    GERD: Nexium was changed to Protonix given potential drug drug interaction with Plavix. Can transition back once Plavix stopped.    Medication Adjustments/Labs and Tests Ordered: Current medicines are reviewed at length with the patient today.  Concerns regarding medicines are outlined above.  Orders Placed This Encounter  Procedures   EKG 12-Lead   Meds ordered this  encounter  Medications   DISCONTD: metoprolol tartrate (LOPRESSOR) 25 MG tablet    Sig: Take 0.5 tablets (12.5 mg total) by mouth 2 (two) times daily.    Dispense:  90 tablet    Refill:  3    Patient Instructions  Medication Instructions:  Your physician has recommended you make the following change in your medication:  START METOPROLOL 12.5 MG TWICE DAILY   *If you need a refill on your cardiac medications before your next appointment, please call your pharmacy*   Lab Work: NONE If you have labs (blood work) drawn today and your tests are completely normal, you will receive your results only by: [place] (if you have MyChart) OR A paper copy in the mail If you have any lab test that is  abnormal or we need to change your treatment, we will call you to review the results.   Testing/Procedures: NONE   Follow-Up: At [facility], you and your health needs are our priority.  As part of our continuing mission to provide you with exceptional heart care, we have created designated Provider Care Teams.  These Care Teams include your primary Cardiologist (physician) and Advanced Practice Providers (APPs -  Physician Assistants and Nurse Practitioners) who all work together to provide you with the care you need, when you need it.  We recommend signing up for the patient portal called "MyChart".  Sign up information is provided on this After Visit Summary.  MyChart is used to connect with patients for Virtual Visits (Telemedicine).  Patients are able to view lab/test results, encounter notes, upcoming appointments, etc.  Non-urgent messages can be sent to your provider as well.   To learn more about what you can do with MyChart, go to [email or web address].    Your next appointment:   KEEP SCHEDULED FOLLOW-UP  Important Information About Sugar         Signed, Kathyrn Drown, NP  08/19/2022 4:14 PM    [facility]

## 2022-04-26 ENCOUNTER — Ambulatory Visit: Payer: Medicare HMO | Attending: Internal Medicine

## 2022-04-26 ENCOUNTER — Ambulatory Visit: Payer: Medicare HMO | Admitting: Cardiology

## 2022-04-26 VITALS — BP 130/70 | HR 89 | Ht 69.0 in | Wt 162.0 lb

## 2022-04-26 DIAGNOSIS — Z9889 Other specified postprocedural states: Secondary | ICD-10-CM

## 2022-04-26 DIAGNOSIS — Z95818 Presence of other cardiac implants and grafts: Secondary | ICD-10-CM | POA: Diagnosis not present

## 2022-04-26 DIAGNOSIS — I4819 Other persistent atrial fibrillation: Secondary | ICD-10-CM

## 2022-04-26 DIAGNOSIS — I4891 Unspecified atrial fibrillation: Secondary | ICD-10-CM | POA: Diagnosis not present

## 2022-04-26 DIAGNOSIS — I10 Essential (primary) hypertension: Secondary | ICD-10-CM | POA: Diagnosis not present

## 2022-04-26 DIAGNOSIS — I340 Nonrheumatic mitral (valve) insufficiency: Secondary | ICD-10-CM

## 2022-04-26 LAB — ECHOCARDIOGRAM COMPLETE
AR max vel: 1.35 cm2
AV Area VTI: 1.32 cm2
AV Area mean vel: 1.11 cm2
AV Mean grad: 8 mmHg
AV Peak grad: 15.4 mmHg
Ao pk vel: 1.96 m/s
Area-P 1/2: 2.91 cm2
MV M vel: 6.01 m/s
MV Peak grad: 144.5 mmHg
MV VTI: 1.26 cm2
Radius: 0.4 cm
S' Lateral: 3.2 cm

## 2022-04-26 MED ORDER — METOPROLOL TARTRATE 25 MG PO TABS: 12.5000 mg | ORAL_TABLET | Freq: Two times a day (BID) | ORAL | 3 refills | Status: DC

## 2022-04-26 NOTE — Patient Instructions (Addendum)
Medication Instructions:  Your physician has recommended you make the following change in your medication:  START METOPROLOL 12.5 MG TWICE DAILY   *If you need a refill on your cardiac medications before your next appointment, please call your pharmacy*   Lab Work: NONE If you have labs (blood work) drawn today and your tests are completely normal, you will receive your results only by: [place] (if you have MyChart) OR A paper copy in the mail If you have any lab test that is abnormal or we need to change your treatment, we will call you to review the results.   Testing/Procedures: NONE   Follow-Up: At [facility], you and your health needs are our priority.  As part of our continuing mission to provide you with exceptional heart care, we have created designated Provider Care Teams.  These Care Teams include your primary Cardiologist (physician) and Advanced Practice Providers (APPs -  Physician Assistants and Nurse Practitioners) who all work together to provide you with the care you need, when you need it.  We recommend signing up for the patient portal called "MyChart".  Sign up information is provided on this After Visit Summary.  MyChart is used to connect with patients for Virtual Visits (Telemedicine).  Patients are able to view lab/test results, encounter notes, upcoming appointments, etc.  Non-urgent messages can be sent to your provider as well.   To learn more about what you can do with MyChart, go to [email or web address].    Your next appointment:   KEEP SCHEDULED FOLLOW-UP  Important Information About Sugar

## 2022-04-27 ENCOUNTER — Other Ambulatory Visit: Payer: Self-pay | Admitting: Cardiology

## 2022-04-27 DIAGNOSIS — Z9889 Other specified postprocedural states: Secondary | ICD-10-CM

## 2022-04-27 DIAGNOSIS — I340 Nonrheumatic mitral (valve) insufficiency: Secondary | ICD-10-CM

## 2022-05-04 ENCOUNTER — Encounter (HOSPITAL_COMMUNITY): Payer: Self-pay | Admitting: Cardiology

## 2022-05-04 ENCOUNTER — Ambulatory Visit (HOSPITAL_COMMUNITY)
Admission: RE | Admit: 2022-05-04 | Discharge: 2022-05-04 | Disposition: A | Discharge status: Home / Self Care | Payer: Medicare HMO | Source: Ambulatory Visit | Attending: Cardiology | Admitting: Cardiology

## 2022-05-04 ENCOUNTER — Other Ambulatory Visit: Payer: Self-pay

## 2022-05-04 VITALS — BP 100/60 | HR 61 | Wt 160.0 lb

## 2022-05-04 DIAGNOSIS — N183 Chronic kidney disease, stage 3 unspecified: Secondary | ICD-10-CM | POA: Insufficient documentation

## 2022-05-04 DIAGNOSIS — I5032 Chronic diastolic (congestive) heart failure: Secondary | ICD-10-CM | POA: Insufficient documentation

## 2022-05-04 DIAGNOSIS — Z79899 Other long term (current) drug therapy: Secondary | ICD-10-CM | POA: Insufficient documentation

## 2022-05-04 DIAGNOSIS — Z87891 Personal history of nicotine dependence: Secondary | ICD-10-CM | POA: Insufficient documentation

## 2022-05-04 DIAGNOSIS — Z8616 Personal history of COVID-19: Secondary | ICD-10-CM | POA: Insufficient documentation

## 2022-05-04 DIAGNOSIS — I340 Nonrheumatic mitral (valve) insufficiency: Secondary | ICD-10-CM | POA: Diagnosis not present

## 2022-05-04 DIAGNOSIS — I130 Hypertensive heart and chronic kidney disease with heart failure and stage 1 through stage 4 chronic kidney disease, or unspecified chronic kidney disease: Secondary | ICD-10-CM | POA: Diagnosis not present

## 2022-05-04 DIAGNOSIS — I2510 Atherosclerotic heart disease of native coronary artery without angina pectoris: Secondary | ICD-10-CM | POA: Insufficient documentation

## 2022-05-04 DIAGNOSIS — Z7901 Long term (current) use of anticoagulants: Secondary | ICD-10-CM | POA: Insufficient documentation

## 2022-05-04 DIAGNOSIS — I4819 Other persistent atrial fibrillation: Secondary | ICD-10-CM | POA: Insufficient documentation

## 2022-05-04 DIAGNOSIS — I341 Nonrheumatic mitral (valve) prolapse: Secondary | ICD-10-CM | POA: Insufficient documentation

## 2022-05-04 DIAGNOSIS — Z8249 Family history of ischemic heart disease and other diseases of the circulatory system: Secondary | ICD-10-CM | POA: Diagnosis not present

## 2022-05-04 DIAGNOSIS — I252 Old myocardial infarction: Secondary | ICD-10-CM | POA: Insufficient documentation

## 2022-05-04 DIAGNOSIS — I482 Chronic atrial fibrillation, unspecified: Secondary | ICD-10-CM | POA: Diagnosis not present

## 2022-05-04 LAB — COMPREHENSIVE METABOLIC PANEL
ALT: 27 U/L (ref 0–44)
AST: 24 U/L (ref 15–41)
Albumin: 3.4 g/dL — ABNORMAL LOW (ref 3.5–5.0)
Alkaline Phosphatase: 84 U/L (ref 38–126)
Anion gap: 9 (ref 5–15)
BUN: 26 mg/dL — ABNORMAL HIGH (ref 8–23)
CO2: 25 mmol/L (ref 22–32)
Calcium: 9.4 mg/dL (ref 8.9–10.3)
Chloride: 107 mmol/L (ref 98–111)
Creatinine, Ser: 1.95 mg/dL — ABNORMAL HIGH (ref 0.61–1.24)
GFR, Estimated: 33 mL/min — ABNORMAL LOW (ref >60)
Glucose, Bld: 133 mg/dL — ABNORMAL HIGH (ref 70–99)
Potassium: 3.7 mmol/L (ref 3.5–5.1)
Sodium: 141 mmol/L (ref 135–145)
Total Bilirubin: 1 mg/dL (ref 0.3–1.2)
Total Protein: 6.7 g/dL (ref 6.5–8.1)

## 2022-05-04 LAB — BRAIN NATRIURETIC PEPTIDE: B Natriuretic Peptide: 490 pg/mL — ABNORMAL HIGH (ref 0.0–100.0)

## 2022-05-04 LAB — CBC
HCT: 41.5 % (ref 39.0–52.0)
Hemoglobin: 13.5 g/dL (ref 13.0–17.0)
MCH: 31.5 pg (ref 26.0–34.0)
MCHC: 32.5 g/dL (ref 30.0–36.0)
MCV: 96.7 fL (ref 80.0–100.0)
Platelets: 179 10*3/uL (ref 150–400)
RBC: 4.29 MIL/uL (ref 4.22–5.81)
RDW: 15.8 % — ABNORMAL HIGH (ref 11.5–15.5)
WBC: 7.1 10*3/uL (ref 4.0–10.5)
nRBC: 0 % (ref 0.0–0.2)

## 2022-05-04 LAB — TSH: TSH: 5.714 u[IU]/mL — ABNORMAL HIGH (ref 0.350–4.500)

## 2022-05-04 MED ORDER — FUROSEMIDE 20 MG PO TABS: 20.0000 mg | ORAL_TABLET | ORAL | 2 refills | Status: DC

## 2022-05-04 NOTE — Progress Notes (Addendum)
PCP: Shirline Frees, MD Cardiology: Dr. Percival Spanish HF Cardiology: Dr. Aundra Dubin  86 y.o. with history of persistent atrial fibrillation and mitral regurgitation was referred by Dr. Ali Lowe for CHF evaluation prior to Mitraclip.  Patient has history of CAD with MI and POBA to LCx and LAD in 2001 as well as BMS to LCx in 2003.  He reported exertional dyspnea beginning last year that he attributed to the after-effects of COVID-19 x 2 episodes.  He saw Dr. Percival Spanish in 4/23 as a pre-op for lithotripsy and was noted to be in atrial fibrillation (first time that AF had been recorded).  Echo was then done, showing preserved LV EF but severe MR.  TEE in 5/23 showed EF 60-65%, mildly decreased RV systolic function, severe LAE, mild bileaflet myxomatous MV prolapse with severe MR (central).  Cath in 6/23 showed mild nonobstructive coronary disease.    Patient had Mitraclip x 2 in 9/23 with followup echo showing EF 50-55%, normal RV, 2 Mitraclips with moderate MR, mean gradient 5, mild-moderate TR.   Patient had DCCV back to NSR in 9/23.    Patient returns for followup of diastolic CHF.  He has "weak spells" that are hard for him to describe, no clearly lightheaded.  These "spells" will last 10-15 minutes.  He is short of breath at times but is improved since Mitraclip and DCCV.  No problems walking into the office today.  Short of breath with stairs/inclines.  No orthopnea/PND.  No chest pain.  HR has been running around 50 bpm. Weight down 11 lbs.   Labs (6/23): K 4, creatinine 1.42 Labs (9/23): K 4.3, creatinine 2  ECG (personally reviewed): NSR, normal  PMH: 1. CAD: MI in 2001 with POBA to CFX and LAD.  - LHC 2003 with BMS to LCx. - LHC (6/23): minimal CAD, patent LCx stent.  2. Mitral regurgitation: TEE (5/23) with EF 60-65%, mildly decreased RV systolic function, severe LAE, mild bileaflet myxomatous MV prolapse with severe MR (central).  - RHC (5/23): mean 5, PA 38/10 mean 23, mean PCWP 17 with v waves  to 34, CI 3.5. - Mitraclip x 2 in 8/23.  - Echo (9/23): EF 50-55%, normal RV, 2 Mitraclips with moderate MR, mean gradient 5, mild-moderate TR.  3. Atrial fibrillation: Persistent since at least 4/23.  - DCCV 9/23 4. HTN 5. Hyperlipidemia 6. Hypothyroidism 7. COVID-19 x 2 8. Nephrolithiasis 9. CKD stage 3  Social History   Socioeconomic History   Marital status: Married    Spouse name: Not on file   Number of children: 2   Years of education: Not on file   Highest education level: Not on file  Occupational History   Occupation: retired  Tobacco Use   Smoking status: Former    Packs/day: 1.00    Years: 3.00    Total pack years: 3.00    Types: Cigarettes    Quit date: 09/26/1962    Years since quitting: 59.6   Smokeless tobacco: Never  Vaping Use   Vaping Use: Never used  Substance and Sexual Activity   Alcohol use: No   Drug use: No   Sexual activity: Not Currently  Other Topics Concern   Not on file  Social History Narrative   Lives at home with wife.  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     Social Determinants of Health   Financial Resource Strain: Not on file  Food Insecurity: Not on file  Transportation Needs: Not on file  Physical Activity: Not on file  Stress: Not on file  Social Connections: Not on file  Intimate Partner Violence: Not on file   Family History  Problem Relation Age of Onset   Heart failure Mother    Prostate cancer Father    ROS: All systems reviewed and negative except as per HPI.   Current Outpatient Medications  Medication Sig Dispense Refill   acetaminophen (TYLENOL) 500 MG tablet Take 500 mg by mouth every 6 (six) hours as needed for moderate pain.     amiodarone (PACERONE) 200 MG tablet Take 200 mg by mouth daily.     amoxicillin (AMOXIL) 500 MG capsule Take 4 capsules (2,000 mg total) by mouth as directed. Take 4 tablets 1 hour prior to dental work, including cleanings. 12 capsule 12   apixaban (ELIQUIS) 5 MG TABS tablet Take 1 tablet (5 mg  total) by mouth 2 (two) times daily. 60 tablet 5   atorvastatin (LIPITOR) 80 MG tablet Take 1 tablet (80 mg total) by mouth at bedtime. 90 tablet 3   Cholecalciferol (VITAMIN D) 50 MCG (2000 UT) tablet Take 2,000 Units by mouth daily.     clopidogrel (PLAVIX) 75 MG tablet Take 1 tablet (75 mg total) by mouth daily with breakfast. 90 tablet 0   Cyanocobalamin (B-12) 5000 MCG CAPS Take 5,000 mcg by mouth daily.     dapagliflozin propanediol (FARXIGA) 10 MG TABS tablet Take 1 tablet (10 mg total) by mouth daily before breakfast. 90 tablet 3   ferrous sulfate 325 (65 FE) MG tablet Take 325 mg by mouth daily with breakfast.     folic acid (FOLVITE) 595 MCG tablet Take 800 mcg by mouth daily.      levothyroxine (SYNTHROID) 25 MCG tablet Take 25 mcg by mouth daily before breakfast.     niacin 500 MG tablet Take 500 mg by mouth daily.     nitroGLYCERIN (NITROSTAT) 0.4 MG SL tablet Place 0.4 mg under the tongue every 5 (five) minutes as needed for chest pain.     pantoprazole (PROTONIX) 40 MG tablet Take 1 tablet (40 mg total) by mouth daily. 90 tablet 0   furosemide (LASIX) 20 MG tablet Take 1 tablet (20 mg total) by mouth every other day. 60 tablet 2   No current facility-administered medications for this encounter.   BP 100/60   Pulse 61   Wt 72.6 kg (160 lb)   SpO2 98%   BMI 23.63 kg/m  General: NAD Neck: No JVD, no thyromegaly or thyroid nodule.  Lungs: Clear to auscultation bilaterally with normal respiratory effort. CV: Nondisplaced PMI.  Heart regular S1/S2, no S3/S4, 1/6 HSM apex.  No peripheral edema.  No carotid bruit.  Normal pedal pulses.  Abdomen: Soft, nontender, no hepatosplenomegaly, no distention.  Skin: Intact without lesions or rashes.  Neurologic: Alert and oriented x 3.  Psych: Normal affect. Extremities: No clubbing or cyanosis.  HEENT: Normal.   Assessment/Plan: 1. Mitral regurgitation: Suspect mixed picture of primary and functional MR, with myxomatous MV prolapse as  well as a component of atrial functional MR with central jet and enlarged left atrium. S/p mTEER in 8/23.  Post-mTEER echo in 9/23 showed MV repair with 2 Mitraclips with moderate MR and mean gradient 5 mmHg.  - He will need Plavix until 06/25/22.  2. Atrial fibrillation: S/p DCCV in 9/23. He remains in NSR today.  - Continue amiodarone 200 mg daily.  Check LFTs and TSH, he will need regular eye exam.    - Continue apixaban. CBC today.  3. Chronic diastolic CHF: Echo in 4/85 showed  EF 50-55%, normal RV, 2 Mitraclips with moderate MR, mean gradient 5, mild-moderate TR.  He is not volume overloaded on exam today.  NYHA class II-III symptoms.  - Continue Lasix 20 mg daily.  - Decrease Lasix to 20 mg every other day. BMET/BNP today.  4. "Weak spells:" BP and HR run low, ?related to overdiuresis or low HR.  - Stop metoprolol.  - Decrease Lasix as above.   Followup in 1 month with APP.    Loralie Champagne 05/04/2022

## 2022-05-04 NOTE — Patient Instructions (Signed)
STOP Metoprolol   CHANGE Lasix to '20mg'$  every other day   Labs done today, your results will be available in MyChart, we will contact you for abnormal readings.  Your physician recommends that you schedule a follow-up appointment in: 1 month  If you have any questions or concerns before your next appointment please send [place] a message through [place] or call our office at [phone].    TO LEAVE A MESSAGE FOR THE NURSE SELECT OPTION 2, PLEASE LEAVE A MESSAGE INCLUDING: YOUR NAME DATE OF BIRTH CALL BACK NUMBER REASON FOR CALL**this is important as we prioritize the call backs  YOU WILL RECEIVE A CALL BACK THE SAME DAY AS LONG AS YOU CALL BEFORE 4:00 PM  At the [facility], you and your health needs are our priority. As part of our continuing mission to provide you with exceptional heart care, we have created designated Provider Care Teams. These Care Teams include your primary Cardiologist (physician) and Advanced Practice Providers (APPs- Physician Assistants and Nurse Practitioners) who all work together to provide you with the care you need, when you need it.   You may see any of the following providers on your designated Care Team at your next follow up: Dr Glori Bickers Dr Loralie Champagne Dr. Roxana Hires, NP Lyda Jester, [place] [facility] [place], [place] Forestine Na, NP Audry Riles, PharmD   Please be sure to bring in all your medications bottles to every appointment.

## 2022-05-21 ENCOUNTER — Other Ambulatory Visit: Payer: Self-pay | Admitting: *Deleted

## 2022-05-21 MED ORDER — PANTOPRAZOLE SODIUM 40 MG PO TBEC: 40.0000 mg | DELAYED_RELEASE_TABLET | Freq: Every day | ORAL | 0 refills | Status: DC

## 2022-05-28 ENCOUNTER — Telehealth (HOSPITAL_COMMUNITY): Payer: Self-pay | Admitting: Pharmacy Technician

## 2022-05-28 ENCOUNTER — Other Ambulatory Visit (HOSPITAL_COMMUNITY): Payer: Self-pay | Admitting: *Deleted

## 2022-05-28 MED ORDER — DAPAGLIFLOZIN PROPANEDIOL 10 MG PO TABS: 10.0000 mg | ORAL_TABLET | Freq: Every day | ORAL | 3 refills | Status: DC

## 2022-05-28 NOTE — Telephone Encounter (Signed)
Advanced Heart Failure Patient Advocate Encounter  Patient left message that he would like some help with [place], co-pay as he has hit the donut hole. Called and spoke with the patient's wife. Unfortunately, he is over the income limit for any kind of assistance based off the income provided. Did advise the patient's wife to ask for samples of Eliquis and Farxiga at his appointment next Friday since there are no other options to help at this time.  Charlann Boxer, CPhT

## 2022-05-31 ENCOUNTER — Telehealth (HOSPITAL_COMMUNITY): Payer: Self-pay | Admitting: Surgery

## 2022-05-31 DIAGNOSIS — R972 Elevated prostate specific antigen [PSA]: Secondary | ICD-10-CM | POA: Diagnosis not present

## 2022-05-31 DIAGNOSIS — R350 Frequency of micturition: Secondary | ICD-10-CM | POA: Diagnosis not present

## 2022-05-31 DIAGNOSIS — N200 Calculus of kidney: Secondary | ICD-10-CM | POA: Diagnosis not present

## 2022-05-31 DIAGNOSIS — N401 Enlarged prostate with lower urinary tract symptoms: Secondary | ICD-10-CM | POA: Diagnosis not present

## 2022-05-31 NOTE — Telephone Encounter (Signed)
4 boxes of Farxiga 10 mg samples were given to patient for home use.  He has a scheduled HF Clinic appt on Friday and requests to speak again to Pharmacy Advocate as he feels the wrong information was given by his wife regarding their income ans assistance qualification.  He may require samples of Eliquis on Friday as well.  I will forward this message to HF Pharmacy Advocate so that they are aware.

## 2022-06-04 ENCOUNTER — Ambulatory Visit (HOSPITAL_COMMUNITY)
Admission: RE | Admit: 2022-06-04 | Discharge: 2022-06-04 | Disposition: A | Discharge status: Home / Self Care | Payer: Medicare HMO | Source: Ambulatory Visit | Attending: Family Medicine | Admitting: Family Medicine

## 2022-06-04 ENCOUNTER — Encounter (HOSPITAL_COMMUNITY): Payer: Self-pay

## 2022-06-04 ENCOUNTER — Other Ambulatory Visit (HOSPITAL_COMMUNITY): Payer: Self-pay

## 2022-06-04 VITALS — BP 144/76 | HR 72 | Wt 162.6 lb

## 2022-06-04 DIAGNOSIS — Z7984 Long term (current) use of oral hypoglycemic drugs: Secondary | ICD-10-CM | POA: Diagnosis not present

## 2022-06-04 DIAGNOSIS — N183 Chronic kidney disease, stage 3 unspecified: Secondary | ICD-10-CM | POA: Insufficient documentation

## 2022-06-04 DIAGNOSIS — I5032 Chronic diastolic (congestive) heart failure: Secondary | ICD-10-CM | POA: Insufficient documentation

## 2022-06-04 DIAGNOSIS — I130 Hypertensive heart and chronic kidney disease with heart failure and stage 1 through stage 4 chronic kidney disease, or unspecified chronic kidney disease: Secondary | ICD-10-CM | POA: Diagnosis not present

## 2022-06-04 DIAGNOSIS — Z8616 Personal history of COVID-19: Secondary | ICD-10-CM | POA: Diagnosis not present

## 2022-06-04 DIAGNOSIS — I340 Nonrheumatic mitral (valve) insufficiency: Secondary | ICD-10-CM | POA: Diagnosis not present

## 2022-06-04 DIAGNOSIS — Z7901 Long term (current) use of anticoagulants: Secondary | ICD-10-CM | POA: Diagnosis not present

## 2022-06-04 DIAGNOSIS — I2510 Atherosclerotic heart disease of native coronary artery without angina pectoris: Secondary | ICD-10-CM | POA: Insufficient documentation

## 2022-06-04 DIAGNOSIS — R531 Weakness: Secondary | ICD-10-CM | POA: Diagnosis not present

## 2022-06-04 DIAGNOSIS — I482 Chronic atrial fibrillation, unspecified: Secondary | ICD-10-CM | POA: Diagnosis not present

## 2022-06-04 DIAGNOSIS — I341 Nonrheumatic mitral (valve) prolapse: Secondary | ICD-10-CM | POA: Diagnosis not present

## 2022-06-04 DIAGNOSIS — I252 Old myocardial infarction: Secondary | ICD-10-CM | POA: Diagnosis not present

## 2022-06-04 DIAGNOSIS — Z79899 Other long term (current) drug therapy: Secondary | ICD-10-CM | POA: Diagnosis not present

## 2022-06-04 LAB — BASIC METABOLIC PANEL
Anion gap: 7 (ref 5–15)
BUN: 18 mg/dL (ref 8–23)
CO2: 26 mmol/L (ref 22–32)
Calcium: 8.9 mg/dL (ref 8.9–10.3)
Chloride: 106 mmol/L (ref 98–111)
Creatinine, Ser: 1.83 mg/dL — ABNORMAL HIGH (ref 0.61–1.24)
GFR, Estimated: 36 mL/min — ABNORMAL LOW (ref >60)
Glucose, Bld: 83 mg/dL (ref 70–99)
Potassium: 3.7 mmol/L (ref 3.5–5.1)
Sodium: 139 mmol/L (ref 135–145)

## 2022-06-04 LAB — BRAIN NATRIURETIC PEPTIDE: B Natriuretic Peptide: 356.1 pg/mL — ABNORMAL HIGH (ref 0.0–100.0)

## 2022-06-04 NOTE — Progress Notes (Signed)
PCP: Shirline Frees, MD Cardiology: Dr. Percival Spanish HF Cardiology: Dr. Aundra Dubin  86 y.o. with history of persistent atrial fibrillation and mitral regurgitation was referred by Dr. Ali Lowe for CHF evaluation prior to Mitraclip.  Patient has history of CAD with MI and POBA to LCx and LAD in 2001 as well as BMS to LCx in 2003.  He reported exertional dyspnea beginning last year that he attributed to the after-effects of COVID-19 x 2 episodes.  He saw Dr. Percival Spanish in 4/23 as a pre-op for lithotripsy and was noted to be in atrial fibrillation (first time that AF had been recorded).  Echo was then done, showing preserved LV EF but severe MR.  TEE in 5/23 showed EF 60-65%, mildly decreased RV systolic function, severe LAE, mild bileaflet myxomatous MV prolapse with severe MR (central).  Cath in 6/23 showed mild nonobstructive coronary disease.    Patient had Mitraclip x 2 in 9/23 with followup echo showing EF 50-55%, normal RV, 2 Mitraclips with moderate MR, mean gradient 5, mild-moderate TR.   Patient had DCCV back to NSR in 9/23.    Follow up 10/323, main complaint was weak spells. HR 50 and weight down 11 lbs. Metoprolol stopped and Lasix changed to every other day.   Today he returns for HF follow up with his wife. Overall feeling much better. Weak spells have resolved. 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 Main complaint is constipation today. Denies palpitations, abnormal bleeding, CP, dizziness, edema, or PND/Orthopnea. Appetite ok. No fever or chills. Weight at home stable. Taking all medications.   Labs (6/23): K 4, creatinine 1.42 Labs (9/23): K 4.3, creatinine 2 Labs (10/23): K 3.7, creatinine 1.95, normal LFTs and TSH.  ECG (personally reviewed): none ordered today.  PMH: 1. CAD: MI in 2001 with POBA to CFX and LAD.  - LHC 2003 with BMS to LCx. - LHC (6/23): minimal CAD, patent LCx stent.  2. Mitral regurgitation: TEE (5/23) with EF 60-65%, mildly decreased  RV systolic function, severe LAE, mild bileaflet myxomatous MV prolapse with severe MR (central).  - RHC (5/23): mean 5, PA 38/10 mean 23, mean PCWP 17 with v waves to 34, CI 3.5. - Mitraclip x 2 in 8/23.  - Echo (9/23): EF 50-55%, normal RV, 2 Mitraclips with moderate MR, mean gradient 5, mild-moderate TR.  3. Atrial fibrillation: Persistent since at least 4/23.  - DCCV 9/23 4. HTN 5. Hyperlipidemia 6. Hypothyroidism 7. COVID-19 x 2 8. Nephrolithiasis 9. CKD stage 3  Social History   Socioeconomic History   Marital status: Married    Spouse name: Not on file   Number of children: 2   Years of education: Not on file   Highest education level: Not on file  Occupational History   Occupation: retired  Tobacco Use   Smoking status: Former    Packs/day: 1.00    Years: 3.00    Total pack years: 3.00    Types: Cigarettes    Quit date: 09/26/1962    Years since quitting: 59.7   Smokeless tobacco: Never  Vaping Use   Vaping Use: Never used  Substance and Sexual Activity   Alcohol use: No   Drug use: No   Sexual activity: Not Currently  Other Topics Concern   Not on file  Social History Narrative   Lives at home with wife.  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     Social Determinants of Health   Financial Resource Strain: Not on file  Food Insecurity: Not on file  Transportation Needs: Not on file  Physical Activity: Not on file  Stress: Not on file  Social Connections: Not on file  Intimate Partner Violence: Not on file   Family History  Problem Relation Age of Onset   Heart failure Mother    Prostate cancer Father    ROS: All systems reviewed and negative except as per HPI.   Current Outpatient Medications  Medication Sig Dispense Refill   acetaminophen (TYLENOL) 500 MG tablet Take 500 mg by mouth every 6 (six) hours as needed for moderate pain.     amiodarone (PACERONE) 200 MG tablet Take 200 mg by mouth daily.     amoxicillin (AMOXIL) 500 MG capsule Take 4 capsules (2,000 mg  total) by mouth as directed. Take 4 tablets 1 hour prior to dental work, including cleanings. 12 capsule 12   apixaban (ELIQUIS) 5 MG TABS tablet Take 1 tablet (5 mg total) by mouth 2 (two) times daily. 60 tablet 5   atorvastatin (LIPITOR) 80 MG tablet Take 1 tablet (80 mg total) by mouth at bedtime. 90 tablet 3   Cholecalciferol (VITAMIN D) 50 MCG (2000 UT) tablet Take 2,000 Units by mouth daily.     clopidogrel (PLAVIX) 75 MG tablet Take 1 tablet (75 mg total) by mouth daily with breakfast. 90 tablet 0   Cyanocobalamin (B-12) 5000 MCG CAPS Take 5,000 mcg by mouth daily.     dapagliflozin propanediol (FARXIGA) 10 MG TABS tablet Take 1 tablet (10 mg total) by mouth daily before breakfast. 90 tablet 3   ferrous sulfate 325 (65 FE) MG tablet Take 325 mg by mouth daily with breakfast.     folic acid (FOLVITE) 401 MCG tablet Take 800 mcg by mouth daily.      furosemide (LASIX) 20 MG tablet Take 1 tablet (20 mg total) by mouth every other day. 60 tablet 2   levothyroxine (SYNTHROID) 25 MCG tablet Take 25 mcg by mouth daily before breakfast.     niacin 500 MG tablet Take 500 mg by mouth daily.     nitroGLYCERIN (NITROSTAT) 0.4 MG SL tablet Place 0.4 mg under the tongue every 5 (five) minutes as needed for chest pain.     pantoprazole (PROTONIX) 40 MG tablet Take 1 tablet (40 mg total) by mouth daily. 90 tablet 0   No current facility-administered medications for this encounter.   BP (!) 144/76   Pulse 72   Wt 73.8 kg (162 lb 9.6 oz)   SpO2 97%   BMI 24.01 kg/m  Physical Exam General:  NAD. No resp difficulty HEENT: Normal Neck: Supple. No JVD. Carotids 2+ bilat; no bruits. No lymphadenopathy or thryomegaly appreciated. Cor: PMI nondisplaced. Regular rate & rhythm. No rubs, gallops, 2/6 HSM apex Lungs: Clear Abdomen: Soft, nontender, nondistended. No hepatosplenomegaly. No bruits or masses. Good bowel sounds. Extremities: No cyanosis, clubbing, rash, edema Neuro: Alert & oriented x 3, cranial  nerves grossly intact. Moves all 4 extremities w/o difficulty. Affect pleasant.  Assessment/Plan: 1. Mitral regurgitation: Suspect mixed picture of primary and functional MR, with myxomatous MV prolapse as well as a component of atrial functional MR with central jet and enlarged left atrium. S/p mTEER in 8/23.  Post-mTEER echo in 9/23 showed MV repair with 2 Mitraclips with moderate MR and mean gradient 5 mmHg.  - He will need Plavix until 06/25/22.  2. Atrial fibrillation: S/p DCCV in 9/23. Regular on exam  today.  - Continue amiodarone 200 mg daily.  Normal LFTs and TSH (10/23), he will need regular eye exam.    - Continue apixaban. Recent CBC stable, Hgb 13.5. Samples given today. 3. Chronic diastolic CHF: Echo in 1/50 showed  EF 50-55%, normal RV, 2 Mitraclips with moderate MR, mean gradient 5, mild-moderate TR.  He is not volume overloaded on exam today.  NYHA class II-III symptoms.  - Continue Farxiga 10 mg daily. Samples given today. - Continue Lasix 20 mg every other day. BMET/BNP today.  4. "Weak spells:" BP and HR run low, ? related to overdiuresis or low HR.  - Now off metoprolol and have resolved.   Follow up in 4 months with Dr. Wynema Birch [facility] FNP-BC 06/04/2022

## 2022-06-04 NOTE — Progress Notes (Signed)
Medication Samples have been provided to the patient.  Drug name: Wilder Glade       Strength:'10MG'$          Qty: 4  LOT: [id number]  Exp.Date: 11/29/2024  Dosing instructions: take 1 tab po qd  The patient has been instructed regarding the correct time, dose, and frequency of taking this medication, including desired effects and most common side effects.   Klarissa Mcilvain R Reita Shindler 81:77 AM 06/04/2022   Medication Samples have been provided to the patient.  Drug name: Eliquis       Strength: '5mg'$         Qty: 4  LOT: [id number]  Exp.Date: 12/2023  Dosing instructions: take 1 tab po bid  The patient has been instructed regarding the correct time, dose, and frequency of taking this medication, including desired effects and most common side effects.   Caleb Prigmore R Nesbit Michon 83:33 AM 06/04/2022

## 2022-06-04 NOTE — Patient Instructions (Addendum)
Labs done today. We will contact you only if your labs are abnormal.  STOP Plavix on 06/25/22  You were provided samples of Eliquis and Farxiga during your appointment today.   No other medication changes were made. Please continue all current medications as prescribed.  Your physician recommends that you schedule a follow-up appointment in: 4 months with Dr. Aundra Dubin. Please contact our office in January 2024 to schedule a March 2024 appointment.   If you have any questions or concerns before your next appointment please send [place] a message through [place] or call our office at [phone].    TO LEAVE A MESSAGE FOR THE NURSE SELECT OPTION 2, PLEASE LEAVE A MESSAGE INCLUDING: YOUR NAME DATE OF BIRTH CALL BACK NUMBER REASON FOR CALL**this is important as we prioritize the call backs  YOU WILL RECEIVE A CALL BACK THE SAME DAY AS LONG AS YOU CALL BEFORE 4:00 PM   Do the following things EVERYDAY: Weigh yourself in the morning before breakfast. Write it down and keep it in a log. Take your medicines as prescribed Eat low salt foods--Limit salt (sodium) to 2000 mg per day.  Stay as active as you can everyday Limit all fluids for the day to less than 2 liters   At the [facility], you and your health needs are our priority. As part of our continuing mission to provide you with exceptional heart care, we have created designated Provider Care Teams. These Care Teams include your primary Cardiologist (physician) and Advanced Practice Providers (APPs- Physician Assistants and Nurse Practitioners) who all work together to provide you with the care you need, when you need it.   You may see any of the following providers on your designated Care Team at your next follow up: Dr Glori Bickers Dr Haynes Kerns, NP Lyda Jester, [place] Audry Riles, PharmD   Please be sure to bring in all your medications bottles to every appointment.

## 2022-06-04 NOTE — Addendum Note (Signed)
Encounter addended by: Shonna Chock, CMA on: 25/10/6642 12:59 PM  Actions taken: Clinical Note Signed

## 2022-06-04 NOTE — Addendum Note (Signed)
Encounter addended by: Shonna Chock, CMA on: 08/05/8401 3:01 PM  Actions taken: Charge Capture section accepted

## 2022-06-10 ENCOUNTER — Other Ambulatory Visit: Payer: Self-pay | Admitting: Physician Assistant

## 2022-06-11 NOTE — Telephone Encounter (Signed)
This is a CHF pt 

## 2022-06-16 DIAGNOSIS — M79671 Pain in right foot: Secondary | ICD-10-CM | POA: Diagnosis not present

## 2022-06-23 ENCOUNTER — Other Ambulatory Visit: Payer: Self-pay | Admitting: Physician Assistant

## 2022-06-30 DIAGNOSIS — M722 Plantar fascial fibromatosis: Secondary | ICD-10-CM | POA: Diagnosis not present

## 2022-07-01 ENCOUNTER — Other Ambulatory Visit: Payer: Self-pay | Admitting: Cardiovascular Disease

## 2022-07-01 DIAGNOSIS — I482 Chronic atrial fibrillation, unspecified: Secondary | ICD-10-CM

## 2022-07-01 NOTE — Telephone Encounter (Signed)
Prescription refill request for Eliquis received. Indication: Afib  Last office visit: 06/04/22 (CHF) Scr: 1.83 (06/04/22)  Age: 86 Weight: 73.8kg  Per dosing criteria, pt's dose should be 2.'5mg'$  BID. Confirmed dose should be changed with Tommy Medal, PharmD. Called pt and made him aware of change.

## 2022-08-06 DIAGNOSIS — G250 Essential tremor: Secondary | ICD-10-CM | POA: Diagnosis not present

## 2022-08-06 DIAGNOSIS — R7303 Prediabetes: Secondary | ICD-10-CM | POA: Diagnosis not present

## 2022-08-06 DIAGNOSIS — I2510 Atherosclerotic heart disease of native coronary artery without angina pectoris: Secondary | ICD-10-CM | POA: Diagnosis not present

## 2022-08-06 DIAGNOSIS — I482 Chronic atrial fibrillation, unspecified: Secondary | ICD-10-CM | POA: Diagnosis not present

## 2022-08-06 DIAGNOSIS — N183 Chronic kidney disease, stage 3 unspecified: Secondary | ICD-10-CM | POA: Diagnosis not present

## 2022-08-06 DIAGNOSIS — K219 Gastro-esophageal reflux disease without esophagitis: Secondary | ICD-10-CM | POA: Diagnosis not present

## 2022-08-06 DIAGNOSIS — D509 Iron deficiency anemia, unspecified: Secondary | ICD-10-CM | POA: Diagnosis not present

## 2022-08-06 DIAGNOSIS — E782 Mixed hyperlipidemia: Secondary | ICD-10-CM | POA: Diagnosis not present

## 2022-08-06 DIAGNOSIS — I10 Essential (primary) hypertension: Secondary | ICD-10-CM | POA: Diagnosis not present

## 2022-08-06 DIAGNOSIS — Z0000 Encounter for general adult medical examination without abnormal findings: Secondary | ICD-10-CM | POA: Diagnosis not present

## 2022-08-06 DIAGNOSIS — E039 Hypothyroidism, unspecified: Secondary | ICD-10-CM | POA: Diagnosis not present

## 2022-08-19 ENCOUNTER — Other Ambulatory Visit: Payer: Self-pay | Admitting: Physician Assistant

## 2022-08-23 DIAGNOSIS — R972 Elevated prostate specific antigen [PSA]: Secondary | ICD-10-CM | POA: Diagnosis not present

## 2022-09-03 DIAGNOSIS — M79671 Pain in right foot: Secondary | ICD-10-CM | POA: Diagnosis not present

## 2022-09-13 DIAGNOSIS — R051 Acute cough: Secondary | ICD-10-CM | POA: Diagnosis not present

## 2022-09-13 DIAGNOSIS — U071 COVID-19: Secondary | ICD-10-CM | POA: Diagnosis not present

## 2022-09-28 DIAGNOSIS — L300 Nummular dermatitis: Secondary | ICD-10-CM | POA: Diagnosis not present

## 2022-09-28 DIAGNOSIS — L570 Actinic keratosis: Secondary | ICD-10-CM | POA: Diagnosis not present

## 2022-09-28 DIAGNOSIS — C44229 Squamous cell carcinoma of skin of left ear and external auricular canal: Secondary | ICD-10-CM | POA: Diagnosis not present

## 2022-09-28 DIAGNOSIS — L578 Other skin changes due to chronic exposure to nonionizing radiation: Secondary | ICD-10-CM | POA: Diagnosis not present

## 2022-10-01 ENCOUNTER — Other Ambulatory Visit: Payer: Self-pay | Admitting: *Deleted

## 2022-10-01 DIAGNOSIS — I340 Nonrheumatic mitral (valve) insufficiency: Secondary | ICD-10-CM

## 2022-10-01 MED ORDER — FUROSEMIDE 20 MG PO TABS: 20.0000 mg | ORAL_TABLET | ORAL | 2 refills | Status: DC

## 2022-10-11 ENCOUNTER — Other Ambulatory Visit (HOSPITAL_COMMUNITY): Payer: Medicare HMO

## 2022-10-14 NOTE — Progress Notes (Signed)
[place]                                     Cardiology Office Note:    Date:  10/15/2022   ID:  [name], DOB 1935-09-15, MRN [id number]  PCP:  Shirline Frees, MD  [facility] Cardiologist:  Minus Breeding, MD / Dr. Ali Lowe, MD and Dr. Burt Knack, MD (TEER)  [facility] Electrophysiologist:  None   Referring MD: Shirline Frees, MD   Chief Complaint  Patient presents with   Follow-up    1 year s/p TEER   History of Present Illness:    [name] is a 87 y.o. male with a hx of  HTN, HLD, CAD s/p remote PCI, persistent atrial fibrillation on apixaban, CKD stage IIIb, and severe symptomatic mitral regurgitation who presented to [facility] on 03/25/22 for planned mTEER and is being seen today for one year follow up.    Mr. Minniefield has history of CAD with MI and POBA to LCx and LAD in 2001 as well as BMS to LCx in 2003.  He reported exertional dyspnea beginning last year that he attributed to the after-effects of COVID-19 x 2 episodes. He saw Dr. Percival Spanish in 11/22/21 as a pre-op for lithotripsy and was noted to be in atrial fibrillation (first time that AF had been recorded).  Echo was then done, showing preserved LV EF but severe MR. TEE in 5/23 showed EF 60-65%, mildly decreased RV systolic function, severe LAE, mild bileaflet myxomatous MV prolapse with severe MR (central).  Cath in 6/23 showed mild nonobstructive coronary disease.  Patient was sent to Dr Ali Lowe for consideration of mTEER.   He was evaluated by Dr. Aundra Dubin in the advanced CHF clinic on 03/12/22 who felt he had atrial functional MR. He suspected atrial fibrillation was playing a role in severe MR and that also the severe MR perpetuates the atrial fibrillation by ongoing LA dilation. He did not think he would hold NSR long-term with severe MR and felt that mTEER was indicated. He favored proceeding with mTEER and then trialing amiodarone followed by DCCV. He was started  on Farxiga 10mg  at that time. He has follow up with Dr Aundra Dubin on 05/04/22.   He underwent successful mTEER 03/25/22 with 2 XTW clips with residual mild mitral regurgitation. Post op echo with a MV peak gradient at 11.0 mmHg and mean mitral valve gradient at 4.8 mmHg with average heart rate of 77 bpm. He was resumed on Eliquis with the addition of Plavix to be continued x 3 months (06/25/22).   He has done well since he was last seen. He was initally struggling with fatigue however this has improved. He is tolerating medications and denies chest pain, Brandt edema, palpitations, dizziness, bleeding in stool or urine, or syncope. One month echocardiogram with stable clip placement with moderate mitral valve regurgitation with pulmonary vein blunting of systolic flow (right sided PV assessed) and a mean gradient at 78mmHg.   Past Medical History:  Diagnosis Date   Arthritis    knees,back   CAD (coronary artery disease) CARDIOLOGIST-  DR HOCHREIN   previous myocardial infarction in 2001 with 90% circumflex lesion treated with PTCA. The LAD had 70-80% stenosis which was treawted with angioplasty. Most recent catheterization in 2003 demonstrated the LAD at 30% mid stenosis, circumflex 60-70% stenosis in the mid AV grove, and  40% stenosis in the previously stented area. The right coronary artery had 30-40% stenosis in the mid portion.    GERD (gastroesophageal reflux disease)    History of kidney stones    Hypertension    Hypothyroidism    Left ureteral stone    Pre-diabetes    S/P mitral valve clip implantation 03/25/2022   s/p TEER wtih two XTW MitraClips by Dr. Burt Knack and Ali Lowe   Severe mitral regurgitation    Skin cancer    face    Past Surgical History:  Procedure Laterality Date   APPENDECTOMY  as teen   BUBBLE STUDY  12/11/2021   Procedure: BUBBLE STUDY;  Surgeon: Elouise Munroe, MD;  Location: [facility] ENDOSCOPY;  Service: Cardiology;;   CARDIAC CATHETERIZATION  03-29-2000   dr hochrein    Nonobstuctive CAD /   CFX patent stent and LAD diffuse luminal irregularities including mid segment previously been angioplastied   CARDIOVASCULAR STRESS TEST  05-09-2015   dr hochrein   low risk perfusion study/  no reversible ischemia/  normal LV function and wall motion , ef 63%   CARDIOVERSION N/A 04/16/2022   Procedure: CARDIOVERSION;  Surgeon: Geralynn Rile, MD;  Location: [facility];  Service: Cardiovascular;  Laterality: N/A;   CATARACT EXTRACTION W/ INTRAOCULAR LENS  IMPLANT, BILATERAL  left 05-06-2015/  right 06-10-2015   CORONARY ANGIOPLASTY WITH STENT PLACEMENT  01-21-2000  dr hochrein   PTCA to pLAD/  PTCA and BMS x1 to mid LCFX/  normal LVSF   CORONARY ANGIOPLASTY WITH STENT PLACEMENT  01-10-2002  dr Lyndel Safe   PTCA and BMS to mid AV LCFX (in-stent restenosis)/  diffuse 30-40% mRCA ,  mLAD diffuse 30%,  normal LVSF, ef 65%   CYSTOSCOPY/RETROGRADE/URETEROSCOPY/STONE EXTRACTION WITH BASKET Left 05/23/2015   Procedure: CYSTOSCOPY, FULGERATION OF PROSTATE,  LEFT URETEROSCOPY, JJ STENT PLACEMENT;  Surgeon: Festus Aloe, MD;  Location: [facility];  Service: Urology;  Laterality: Left;   ELECTROPHYSIOLOGIC STUDY  03-30-2000   dr Caryl Comes   EXTRACORPOREAL SHOCK WAVE LITHOTRIPSY Left 10-30-2012   EXTRACORPOREAL SHOCK WAVE LITHOTRIPSY Right 11/16/2021   Procedure: EXTRACORPOREAL SHOCK WAVE LITHOTRIPSY (ESWL);  Surgeon: Franchot Gallo, MD;  Location: [facility];  Service: Urology;  Laterality: Right;   HOLMIUM LASER APPLICATION Left [id number]   Procedure: WITH HOLMIUM LASER LITHOTRIPSY;  Surgeon: Festus Aloe, MD;  Location: [facility];  Service: Urology;  Laterality: Left;   KNEE ARTHROSCOPY Bilateral x2 right/  left 2015   LUMBAR LAMINECTOMY/DECOMPRESSION MICRODISCECTOMY N/A 09/28/2012   Procedure: L3  -L5 DECOMPRESSION L4 - L5 FUSION WITH INSTRUMENTATION 2 LEVELS;  Surgeon: Melina Schools, MD;  Location: [place];  Service:  Orthopedics;  Laterality: N/A;   MITRAL VALVE REPAIR N/A 03/25/2022   Procedure: MITRAL VALVE REPAIR;  Surgeon: Early Osmond, MD;  Location: [place] CV [facility];  Service: Cardiovascular;  Laterality: N/A;   RIGHT/LEFT HEART CATH AND CORONARY ANGIOGRAPHY N/A 01/27/2022   Procedure: RIGHT/LEFT HEART CATH AND CORONARY ANGIOGRAPHY;  Surgeon: Early Osmond, MD;  Location: [place] [facility];  Service: Cardiovascular;  Laterality: N/A;   TEE WITHOUT CARDIOVERSION N/A 12/11/2021   Procedure: TRANSESOPHAGEAL ECHOCARDIOGRAM (TEE);  Surgeon: Elouise Munroe, MD;  Location: [place];  Service: Cardiology;  Laterality: N/A;   TEE WITHOUT CARDIOVERSION N/A 03/25/2022   Procedure: TRANSESOPHAGEAL ECHOCARDIOGRAM (TEE);  Surgeon: Early Osmond, MD;  Location: [place] [facility];  Service: Cardiovascular;  Laterality: N/A;   TONSILLECTOMY  as child   TOTAL  KNEE ARTHROPLASTY Left 10/02/2018   Procedure: TOTAL LEFT  KNEE ARTHROPLASTY;  Surgeon: Paralee Cancel, MD;  Location: [facility];  Service: Orthopedics;  Laterality: Left;  90 mins- Ok per Tiffany   TOTAL SHOULDER ARTHROPLASTY Right 01/14/2011   TRANSTHORACIC ECHOCARDIOGRAM  05-21-2015   mild LVH,  ef 55-605,  grade 2 diastolic dysfunction, basal inferior hyperkinesis/  moderate AV calcification sclerosis without stenosis/  trivial MR/  moderate LAE/  mild TR/  mild RAE    Current Medications: Current Meds  Medication Sig   acetaminophen (TYLENOL) 500 MG tablet Take 500 mg by mouth every 6 (six) hours as needed for moderate pain.   amiodarone (PACERONE) 200 MG tablet Take 1 tablet (200 mg total) by mouth daily.   amoxicillin (AMOXIL) 500 MG capsule Take 4 capsules (2,000 mg total) by mouth as directed. Take 4 tablets 1 hour prior to dental work, including cleanings.   apixaban (ELIQUIS) 2.5 MG TABS tablet Take 1 tablet (2.5 mg total) by mouth 2 (two) times daily.   atorvastatin (LIPITOR) 80 MG tablet Take 1 tablet (80 mg total) by mouth at bedtime.    Cholecalciferol (VITAMIN D) 50 MCG (2000 UT) tablet Take 2,000 Units by mouth daily.   Cyanocobalamin (B-12) 5000 MCG CAPS Take 5,000 mcg by mouth daily.   dapagliflozin propanediol (FARXIGA) 10 MG TABS tablet Take 1 tablet (10 mg total) by mouth daily before breakfast.   ferrous sulfate 325 (65 FE) MG tablet Take 325 mg by mouth daily with breakfast.   folic acid (FOLVITE) Q000111Q MCG tablet Take 800 mcg by mouth daily.    furosemide (LASIX) 20 MG tablet Take 1 tablet (20 mg total) by mouth every other day.   levothyroxine (SYNTHROID) 25 MCG tablet Take 25 mcg by mouth daily before breakfast.   niacin 500 MG tablet Take 500 mg by mouth daily.   nitroGLYCERIN (NITROSTAT) 0.4 MG SL tablet Place 0.4 mg under the tongue every 5 (five) minutes as needed for chest pain.   pantoprazole (PROTONIX) 40 MG tablet Take 1 tablet (40 mg total) by mouth daily.   [DISCONTINUED] clopidogrel (PLAVIX) 75 MG tablet Take 1 tablet (75 mg total) by mouth daily with breakfast.     Allergies:   Patient has no known allergies.   Social History   Socioeconomic History   Marital status: Married    Spouse name: Not on file   Number of children: 2   Years of education: Not on file   Highest education level: Not on file  Occupational History   Occupation: retired  Tobacco Use   Smoking status: Former    Packs/day: 1.00    Years: 3.00    Additional pack years: 0.00    Total pack years: 3.00    Types: Cigarettes    Quit date: 09/26/1962    Years since quitting: 60.0   Smokeless tobacco: Never  Vaping Use   Vaping Use: Never used  Substance and Sexual Activity   Alcohol use: No   Drug use: No   Sexual activity: Not Currently  Other Topics Concern   Not on file  Social History Narrative   Lives at home with wife.  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     Social Determinants of Health   Financial Resource Strain: Not on file  Food Insecurity: Not on file  Transportation Needs: Not on file  Physical Activity: Not on  file  Stress: Not on file  Social Connections: Not on file     Family  History: The patient's family history includes Heart failure in his mother; Prostate cancer in his father.  ROS:   Please see the history of present illness.    All other systems reviewed and are negative.  EKGs/Labs/Other Studies Reviewed:    The following studies were reviewed today: Echocardiogram 04/26/22:   1. Left ventricular ejection fraction, by estimation, is 50 to 55%. The  left ventricle has low normal function. The left ventricle has no regional  wall motion abnormalities. Left ventricular diastolic parameters are  indeterminate.   2. Right ventricular systolic function is normal. The right ventricular  size is normal.   3. The mitral valve has been replaed by two XTW Mitra-Clips both medial  and lateral to A2-P2. Clips without excessive motion. Moderate mitral  valve regurgitation with pulmonary vein blunting of systolic flow (right  sided PV assessed). Jets are medial  and lateral to clips. MR best appreciated in apical views. Mean gradient  of 5 mm Hg. Unable to planimetry residual orifice   4. The aortic valve is tricuspid. There is mild calcification of the  aortic valve. There is mild thickening of the aortic valve. Aortic valve  regurgitation is not visualized. In the setting of decrease LV stroke  volume index and DVI 04, suspect mild  aortic valve stenosis.   5. Left atrial size was moderately dilated.   6. Evidence of atrial level shunting detected by color flow Doppler.   7. Tricuspid valve regurgitation is mild to moderate.      Echocardiogram 03/26/22:   Left ventricular ejection fraction, by estimation, is 50 to 55%. The left ventricle has low normal function. The left ventricle demonstrates regional wall motion abnormalities (see scoring diagram/findings for description). Left ventricular diastolic function could not be evaluated. There is moderate hypokinesis of the left  ventricular, basal-mid lateral wall. 1. Right ventricular systolic function is normal. The right ventricular size is normal. There is normal pulmonary artery systolic pressure. The estimated right ventricular systolic pressure is [id number] mmHg. 2. 3. Left atrial size was severely dilated. 4. Right atrial size was severely dilated. Pressure half time 115 ms. The mitral valve is myxomatous. Mild mitral valve regurgitation. Mild mitral stenosis. The mean mitral valve gradient is 4.8 mmHg with average heart rate of 77 bpm. Procedure Date: 03/25/2022. 5. 6. Tricuspid valve regurgitation is mild to moderate. The aortic valve is tricuspid. Aortic valve regurgitation is not visualized. Aortic valve sclerosis is present, with no evidence of aortic valve stenosis. 7. Evidence of atrial level shunting detected by color flow Doppler. There is a small secundum atrial septal defect with predominantly left to right shunting across the atrial septum.   Pressure half time 115 ms. The mitral valve is myxomatous. Mild mitral valve regurgitation. There is a Mitra-Clip present in the mitral position. Mild mitral valve stenosis. MV peak gradient, 11.0 mmHg. The mean mitral valve gradient is 4.8 mmHg with average heart rate of 77 bpm.     [facility]   MULTIDISCIPLINARY HEART TEAM   Date of Procedure:                03/25/2022   Preoperative Diagnosis:      Severe Symptomatic Mitral Regurgitation (Stage D)   Postoperative Diagnosis:    Same    Procedure Performed: Ultrasound-guided right transfemoral venous access Double PreClose right femoral vein Transseptal puncture using Bailess RF needle Mitral valve repair with MitraClip XTW x 2   Surgeon: Lenna Sciara, MD  Co-Surgeon: Sherren Mocha,  MD   Echocardiographer: Sanda Klein, MD   Anesthesiologist: Albertha Ghee, MD   Device Implant: Mitraclip XTW, Serial # [id number]                            Mitraclip XTW, Serial  [id number]   Procedural Indication: Severe Non-rheumatic Mitral Regurgitation (Stage D)    EKG:  EKG is not ordered today.    Recent Labs: 05/04/2022: ALT 27; Hemoglobin 13.5; Platelets 179; TSH 5.714 06/04/2022: B Natriuretic Peptide 356.1; BUN 18; Creatinine, Ser 1.83; Potassium 3.7; Sodium 139  Recent Lipid Panel    Component Value Date/Time   CHOL 115 11/26/2013 1044   TRIG 48.0 11/26/2013 1044   HDL 49.70 11/26/2013 1044   CHOLHDL 2 11/26/2013 1044   VLDL 9.6 11/26/2013 1044   LDLCALC 56 11/26/2013 1044   Physical Exam:    VS:  BP (!) 140/68   Pulse 62   Ht 5\' 9"  (1.753 m)   Wt 163 lb 3.2 oz (74 kg)   SpO2 97%   BMI 24.10 kg/m     Wt Readings from Last 3 Encounters:  10/15/22 163 lb 3.2 oz (74 kg)  06/04/22 162 lb 9.6 oz (73.8 kg)  05/04/22 160 lb (72.6 kg)    General: Well developed, well nourished, NAD Lungs:Clear to ausculation bilaterally. No wheezes, rales, or rhonchi. Breathing is unlabored. Cardiovascular: RRR with S1 S2. Soft flow murmur Extremities: No edema.  Neuro: Alert and oriented. No focal deficits. No facial asymmetry. MAE spontaneously. Psych: Responds to questions appropriately with normal affect.    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 Echo today with mild to moderate mitral valve regurgitation with XTW Mitra-Clip, MV peak gradient, 8.8 mmHg, mean gradient is 3.0 mmHg. Continue Eliquis and he can stop Plavix. This was for 3 months post clip. Will need lifelong dental SBE. Has amoxicillin. Plan follow up with Dr. Aundra Dubin next month.   Persistent atrial fibrillation: Resumed on home Eliqius post clip with the addition of Plavix x 3 months. Continue amiodarone 200mg  daily. Last CXR appears stable. TSH from 05/2022 at 5.7 with no changes made at that time. Normal LFTs.    Chronic diastolic CHF: Appear euvolemic today.  Continue to follow closely with AHF.   CKD stage IIIb: Baseline Cr runs in the 1.8-2.0 range.    GERD: Nexium was changed to Protonix given potential drug drug interaction with Plavix. Can transition back to Nexium now that we have stopped Plavix.   Medication Adjustments/Labs and Tests Ordered: Current medicines are reviewed at length with the patient today.  Concerns regarding medicines are outlined above.  No orders of the defined types were placed in this encounter.  No orders of the defined types were placed in this encounter.   Patient Instructions  Medication Instructions:  Your physician has recommended you make the following change in your medication:  STOP PLAVIX  *If you need a refill on your cardiac medications before your next appointment, please call your pharmacy*   Lab Work: NONE If you have labs (blood work) drawn today and your tests are completely normal, you will receive your results only by: [place] (if you have MyChart) OR A paper copy in the mail If you have any lab test that is abnormal or we need to change your treatment, we will call you to  review the results.   Testing/Procedures: NONE   Follow-Up: At [facility], you and your health needs are our priority.  As part of our continuing mission to provide you with exceptional heart care, we have created designated Provider Care Teams.  These Care Teams include your primary Cardiologist (physician) and Advanced Practice Providers (APPs -  Physician Assistants and Nurse Practitioners) who all work together to provide you with the care you need, when you need it.  We recommend signing up for the patient portal called "MyChart".  Sign up information is provided on this After Visit Summary.  MyChart is used to connect with patients for Virtual Visits (Telemedicine).  Patients are able to view lab/test results, encounter notes, upcoming appointments, etc.  Non-urgent messages can be sent to your  provider as well.   To learn more about what you can do with MyChart, go to [email or web address].    Your next appointment:   KEEP SCHEDULED FOLLOW-UP   Signed, Kathyrn Drown, NP  10/15/2022 2:41 PM    [facility]

## 2022-10-15 ENCOUNTER — Ambulatory Visit (HOSPITAL_COMMUNITY): Payer: Medicare HMO | Attending: Internal Medicine

## 2022-10-15 ENCOUNTER — Ambulatory Visit (INDEPENDENT_AMBULATORY_CARE_PROVIDER_SITE_OTHER): Payer: Medicare HMO | Admitting: Cardiology

## 2022-10-15 VITALS — BP 140/68 | HR 62 | Ht 69.0 in | Wt 163.2 lb

## 2022-10-15 DIAGNOSIS — I482 Chronic atrial fibrillation, unspecified: Secondary | ICD-10-CM

## 2022-10-15 DIAGNOSIS — I5032 Chronic diastolic (congestive) heart failure: Secondary | ICD-10-CM | POA: Diagnosis not present

## 2022-10-15 DIAGNOSIS — I10 Essential (primary) hypertension: Secondary | ICD-10-CM | POA: Insufficient documentation

## 2022-10-15 DIAGNOSIS — K219 Gastro-esophageal reflux disease without esophagitis: Secondary | ICD-10-CM | POA: Insufficient documentation

## 2022-10-15 DIAGNOSIS — Z9889 Other specified postprocedural states: Secondary | ICD-10-CM | POA: Diagnosis not present

## 2022-10-15 DIAGNOSIS — Z95818 Presence of other cardiac implants and grafts: Secondary | ICD-10-CM

## 2022-10-15 DIAGNOSIS — I340 Nonrheumatic mitral (valve) insufficiency: Secondary | ICD-10-CM | POA: Diagnosis not present

## 2022-10-15 LAB — ECHOCARDIOGRAM COMPLETE
Area-P 1/2: 2.42 cm2
MV M vel: 5.83 m/s
MV Peak grad: 136 mmHg
MV VTI: 1.27 cm2
Radius: 0.8 cm
S' Lateral: 3.9 cm

## 2022-10-15 NOTE — Patient Instructions (Addendum)
Medication Instructions:  Your physician has recommended you make the following change in your medication:  STOP PLAVIX  *If you need a refill on your cardiac medications before your next appointment, please call your pharmacy*   Lab Work: NONE If you have labs (blood work) drawn today and your tests are completely normal, you will receive your results only by: [place] (if you have MyChart) OR A paper copy in the mail If you have any lab test that is abnormal or we need to change your treatment, we will call you to review the results.   Testing/Procedures: NONE   Follow-Up: At [facility], you and your health needs are our priority.  As part of our continuing mission to provide you with exceptional heart care, we have created designated Provider Care Teams.  These Care Teams include your primary Cardiologist (physician) and Advanced Practice Providers (APPs -  Physician Assistants and Nurse Practitioners) who all work together to provide you with the care you need, when you need it.  We recommend signing up for the patient portal called "MyChart".  Sign up information is provided on this After Visit Summary.  MyChart is used to connect with patients for Virtual Visits (Telemedicine).  Patients are able to view lab/test results, encounter notes, upcoming appointments, etc.  Non-urgent messages can be sent to your provider as well.   To learn more about what you can do with MyChart, go to [email or web address].    Your next appointment:   KEEP SCHEDULED FOLLOW-UP

## 2022-10-25 DIAGNOSIS — R350 Frequency of micturition: Secondary | ICD-10-CM | POA: Diagnosis not present

## 2022-10-25 DIAGNOSIS — N401 Enlarged prostate with lower urinary tract symptoms: Secondary | ICD-10-CM | POA: Diagnosis not present

## 2022-10-25 DIAGNOSIS — R972 Elevated prostate specific antigen [PSA]: Secondary | ICD-10-CM | POA: Diagnosis not present

## 2022-10-25 DIAGNOSIS — N200 Calculus of kidney: Secondary | ICD-10-CM | POA: Diagnosis not present

## 2022-11-02 NOTE — Addendum Note (Signed)
Addended by: Harland German A on: 11/02/2022 05:13 PM   Modules accepted: Orders

## 2022-11-10 DIAGNOSIS — E039 Hypothyroidism, unspecified: Secondary | ICD-10-CM | POA: Diagnosis not present

## 2022-11-16 ENCOUNTER — Telehealth: Payer: Self-pay | Admitting: Pharmacist Clinician (PhC)/ Clinical Pharmacy Specialist

## 2022-11-16 MED ORDER — APIXABAN 5 MG PO TABS: ORAL_TABLET | ORAL | 1 refills | Status: DC

## 2022-11-16 NOTE — Telephone Encounter (Signed)
Spoke with patient.  Explained that the 5 mg tab not scored and therefore no guarantee that dose in each half is equivalent.  He and his wife Shaunn Tackitt) were both on the line and stated that they had previously cut 5 mg tabs when their respective doses were decreased and they are comfortable doing it again.  They did not qualify for assistance from the manufacturer and are trying to cut costs to be able to afford the medication all year.  They agreed to use 1 tablet per day, cutting in half to take half in the morning and half at night.    Will update prescription.

## 2022-11-30 ENCOUNTER — Ambulatory Visit (HOSPITAL_COMMUNITY)
Admission: RE | Admit: 2022-11-30 | Discharge: 2022-11-30 | Disposition: A | Discharge status: Home / Self Care | Payer: Medicare HMO | Source: Ambulatory Visit | Attending: Cardiology | Admitting: Cardiology

## 2022-11-30 ENCOUNTER — Encounter (HOSPITAL_COMMUNITY): Payer: Self-pay | Admitting: Cardiology

## 2022-11-30 VITALS — BP 130/82 | HR 72 | Wt 164.0 lb

## 2022-11-30 DIAGNOSIS — I340 Nonrheumatic mitral (valve) insufficiency: Secondary | ICD-10-CM | POA: Insufficient documentation

## 2022-11-30 DIAGNOSIS — I341 Nonrheumatic mitral (valve) prolapse: Secondary | ICD-10-CM | POA: Diagnosis not present

## 2022-11-30 DIAGNOSIS — Z7984 Long term (current) use of oral hypoglycemic drugs: Secondary | ICD-10-CM | POA: Insufficient documentation

## 2022-11-30 DIAGNOSIS — E785 Hyperlipidemia, unspecified: Secondary | ICD-10-CM | POA: Diagnosis not present

## 2022-11-30 DIAGNOSIS — Z87891 Personal history of nicotine dependence: Secondary | ICD-10-CM | POA: Insufficient documentation

## 2022-11-30 DIAGNOSIS — Z7901 Long term (current) use of anticoagulants: Secondary | ICD-10-CM | POA: Diagnosis not present

## 2022-11-30 DIAGNOSIS — I2510 Atherosclerotic heart disease of native coronary artery without angina pectoris: Secondary | ICD-10-CM | POA: Diagnosis not present

## 2022-11-30 DIAGNOSIS — I4819 Other persistent atrial fibrillation: Secondary | ICD-10-CM | POA: Insufficient documentation

## 2022-11-30 DIAGNOSIS — Z79899 Other long term (current) drug therapy: Secondary | ICD-10-CM | POA: Diagnosis not present

## 2022-11-30 DIAGNOSIS — I252 Old myocardial infarction: Secondary | ICD-10-CM | POA: Insufficient documentation

## 2022-11-30 DIAGNOSIS — N183 Chronic kidney disease, stage 3 unspecified: Secondary | ICD-10-CM | POA: Diagnosis not present

## 2022-11-30 DIAGNOSIS — I130 Hypertensive heart and chronic kidney disease with heart failure and stage 1 through stage 4 chronic kidney disease, or unspecified chronic kidney disease: Secondary | ICD-10-CM | POA: Diagnosis not present

## 2022-11-30 DIAGNOSIS — Z8616 Personal history of COVID-19: Secondary | ICD-10-CM | POA: Diagnosis not present

## 2022-11-30 DIAGNOSIS — Z955 Presence of coronary angioplasty implant and graft: Secondary | ICD-10-CM | POA: Insufficient documentation

## 2022-11-30 DIAGNOSIS — I482 Chronic atrial fibrillation, unspecified: Secondary | ICD-10-CM | POA: Insufficient documentation

## 2022-11-30 DIAGNOSIS — E039 Hypothyroidism, unspecified: Secondary | ICD-10-CM | POA: Insufficient documentation

## 2022-11-30 DIAGNOSIS — Z79890 Hormone replacement therapy (postmenopausal): Secondary | ICD-10-CM | POA: Diagnosis not present

## 2022-11-30 DIAGNOSIS — I5032 Chronic diastolic (congestive) heart failure: Secondary | ICD-10-CM | POA: Diagnosis not present

## 2022-11-30 LAB — CBC
HCT: 42.9 % (ref 39.0–52.0)
Hemoglobin: 13.8 g/dL (ref 13.0–17.0)
MCH: 31.2 pg (ref 26.0–34.0)
MCHC: 32.2 g/dL (ref 30.0–36.0)
MCV: 96.8 fL (ref 80.0–100.0)
Platelets: 131 10*3/uL — ABNORMAL LOW (ref 150–400)
RBC: 4.43 MIL/uL (ref 4.22–5.81)
RDW: 15 % (ref 11.5–15.5)
WBC: 6.9 10*3/uL (ref 4.0–10.5)
nRBC: 0 % (ref 0.0–0.2)

## 2022-11-30 LAB — BASIC METABOLIC PANEL
Anion gap: 9 (ref 5–15)
BUN: 22 mg/dL (ref 8–23)
CO2: 27 mmol/L (ref 22–32)
Calcium: 9.3 mg/dL (ref 8.9–10.3)
Chloride: 104 mmol/L (ref 98–111)
Creatinine, Ser: 1.62 mg/dL — ABNORMAL HIGH (ref 0.61–1.24)
GFR, Estimated: 42 mL/min — ABNORMAL LOW (ref >60)
Glucose, Bld: 103 mg/dL — ABNORMAL HIGH (ref 70–99)
Potassium: 3.9 mmol/L (ref 3.5–5.1)
Sodium: 140 mmol/L (ref 135–145)

## 2022-11-30 LAB — BRAIN NATRIURETIC PEPTIDE: B Natriuretic Peptide: 612.9 pg/mL — ABNORMAL HIGH (ref 0.0–100.0)

## 2022-11-30 LAB — LIPID PANEL
Cholesterol: 163 mg/dL (ref 0–200)
HDL: 54 mg/dL (ref >40)
LDL Cholesterol: 93 mg/dL (ref 0–99)
Total CHOL/HDL Ratio: 3 RATIO
Triglycerides: 81 mg/dL (ref <150)
VLDL: 16 mg/dL (ref 0–40)

## 2022-11-30 LAB — TSH: TSH: 6.497 u[IU]/mL — ABNORMAL HIGH (ref 0.350–4.500)

## 2022-11-30 NOTE — Progress Notes (Signed)
PCP: Johny Blamer, MD Cardiology: Dr. Antoine Poche HF Cardiology: Dr. Shirlee Latch  87 y.o. with history of persistent atrial fibrillation and mitral regurgitation was referred by Dr. Lynnette Caffey for CHF evaluation prior to Mitraclip.  Patient has history of CAD with MI and POBA to LCx and LAD in 2001 as well as BMS to LCx in 2003.  He reported exertional dyspnea beginning last year that he attributed to the after-effects of COVID-19 x 2 episodes.  He saw Dr. Antoine Poche in 4/23 as a pre-op for lithotripsy and was noted to be in atrial fibrillation (first time that AF had been recorded).  Echo was then done, showing preserved LV EF but severe MR.  TEE in 5/23 showed EF 60-65%, mildly decreased RV systolic function, severe LAE, mild bileaflet myxomatous MV prolapse with severe MR (central).  Cath in 6/23 showed mild nonobstructive coronary disease.    Patient had Mitraclip x 2 in 9/23 with followup echo showing EF 50-55%, normal RV, 2 Mitraclips with moderate MR, mean gradient 5, mild-moderate TR.   Patient had DCCV back to NSR in 9/23.    Echo in 3/24 showed EF 55-60%, severe LAE, Mitraclip x 2 with mild-moderate MR and mean gradient 3 mmHg, IVC normal.   Patient returns for followup of diastolic CHF and atrial fibrillation. He remains in NSR today.  No palpitations.  Minimal exertional dyspnea, main limitor is knee pain.  He is able to mow his grass and garden without significant shortness of breath.  No chest pain.  No lightheadedness. Weight is up 2 lbs.   Labs (6/23): K 4, creatinine 1.42 Labs (9/23): K 4.3, creatinine 2 Labs (10/23): K 3.7, creatinine 1.95, normal LFTs and TSH. Labs (4/24): K 3.7, creatinine 1.83, BNP 356  ECG (personally reviewed): NSR, nonspecific T wave flattening  PMH: 1. CAD: MI in 2001 with POBA to CFX and LAD.  - LHC 2003 with BMS to LCx. - LHC (6/23): minimal CAD, patent LCx stent.  2. Mitral regurgitation: TEE (5/23) with EF 60-65%, mildly decreased RV systolic function,  severe LAE, mild bileaflet myxomatous MV prolapse with severe MR (central).  - RHC (5/23): mean 5, PA 38/10 mean 23, mean PCWP 17 with v waves to 34, CI 3.5. - Mitraclip x 2 in 8/23.  - Echo (9/23): EF 50-55%, normal RV, 2 Mitraclips with moderate MR, mean gradient 5, mild-moderate TR.  - Echo (3/24): EF 55-60%, severe LAE, Mitraclip x 2 with mild-moderate MR and mean gradient 3 mmHg, IVC normal.  3. Atrial fibrillation: Persistent since at least 4/23.  - DCCV 9/23 4. HTN 5. Hyperlipidemia 6. Hypothyroidism 7. COVID-19 x 2 8. Nephrolithiasis 9. CKD stage 3  Social History   Socioeconomic History   Marital status: Married    Spouse name: Not on file   Number of children: 2   Years of education: Not on file   Highest education level: Not on file  Occupational History   Occupation: retired  Tobacco Use   Smoking status: Former    Packs/day: 1.00    Years: 3.00    Additional pack years: 0.00    Total pack years: 3.00    Types: Cigarettes    Quit date: 09/26/1962    Years since quitting: 60.2   Smokeless tobacco: Never  Vaping Use   Vaping Use: Never used  Substance and Sexual Activity   Alcohol use: No   Drug use: No   Sexual activity: Not Currently  Other Topics Concern   Not on file  Social History Narrative   Lives at home with wife.  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     Social Determinants of Health   Financial Resource Strain: Not on file  Food Insecurity: Not on file  Transportation Needs: Not on file  Physical Activity: Not on file  Stress: Not on file  Social Connections: Not on file  Intimate Partner Violence: Not on file   Family History  Problem Relation Age of Onset   Heart failure Mother    Prostate cancer Father    ROS: All systems reviewed and negative except as per HPI.   Current Outpatient Medications  Medication Sig Dispense Refill   acetaminophen (TYLENOL) 500 MG tablet Take 500 mg by mouth every 6 (six) hours as needed for moderate pain.      amiodarone (PACERONE) 200 MG tablet Take 1 tablet (200 mg total) by mouth daily. 90 tablet 1   amoxicillin (AMOXIL) 500 MG capsule Take 4 capsules (2,000 mg total) by mouth as directed. Take 4 tablets 1 hour prior to dental work, including cleanings. 12 capsule 12   apixaban (ELIQUIS) 5 MG TABS tablet Take 1/2 tablet by mouth twice daily 90 tablet 1   atorvastatin (LIPITOR) 80 MG tablet Take 1 tablet (80 mg total) by mouth at bedtime. 90 tablet 3   Cholecalciferol (VITAMIN D) 50 MCG (2000 UT) tablet Take 2,000 Units by mouth daily.     Cyanocobalamin (B-12) 5000 MCG CAPS Take 5,000 mcg by mouth daily.     dapagliflozin propanediol (FARXIGA) 10 MG TABS tablet Take 1 tablet (10 mg total) by mouth daily before breakfast. 90 tablet 3   ferrous sulfate 325 (65 FE) MG tablet Take 325 mg by mouth daily with breakfast.     finasteride (PROSCAR) 5 MG tablet Take 5 mg by mouth daily.     folic acid (FOLVITE) 800 MCG tablet Take 800 mcg by mouth daily.      furosemide (LASIX) 20 MG tablet Take 1 tablet (20 mg total) by mouth every other day. 60 tablet 2   levothyroxine (SYNTHROID) 25 MCG tablet Take 25 mcg by mouth daily before breakfast.     niacin 500 MG tablet Take 500 mg by mouth daily.     nitroGLYCERIN (NITROSTAT) 0.4 MG SL tablet Place 0.4 mg under the tongue every 5 (five) minutes as needed for chest pain.     pantoprazole (PROTONIX) 40 MG tablet Take 1 tablet (40 mg total) by mouth daily. 90 tablet 2   No current facility-administered medications for this encounter.   BP 130/82   Pulse 72   Wt 74.4 kg (164 lb)   SpO2 97%   BMI 24.22 kg/m  General: NAD Neck: No JVD, no thyromegaly or thyroid nodule.  Lungs: Clear to auscultation bilaterally with normal respiratory effort. CV: Nondisplaced PMI.  Heart regular S1/S2, no S3/S4, no murmur.  No peripheral edema.  No carotid bruit.  Normal pedal pulses.  Abdomen: Soft, nontender, no hepatosplenomegaly, no distention.  Skin: Intact without lesions  or rashes.  Neurologic: Alert and oriented x 3.  Psych: Normal affect. Extremities: No clubbing or cyanosis.  HEENT: Normal.   Assessment/Plan: 1. Mitral regurgitation: Suspect mixed picture of primary and functional MR, with myxomatous MV prolapse as well as a component of atrial functional MR with central jet and enlarged left atrium. S/p mTEER in 8/23.  Post-mTEER echo in 9/23 showed MV repair with 2 Mitraclips with moderate MR and mean gradient 5 mmHg.  Echo in 3/24 showed  mild-moderate MR s/p Mitraclip with mean gradient 3 mmHg.  2. Atrial fibrillation: S/p DCCV in 9/23. NSR today.  - Continue amiodarone 200 mg daily.  Check LFTs and TSH, he will need regular eye exam.    - Continue apixaban. CBC today.  3. Chronic diastolic CHF: Echo in 3/24 showed EF 55-60%, severe LAE, Mitraclip x 2 with mild-moderate MR and mean gradient 3 mmHg, IVC normal.  He is not volume overloaded on exam today.  NYHA class II symptoms. Improved post-Mitraclip.  - Continue Farxiga 10 mg daily.  - Continue Lasix 20 mg every other day. BMET/BNP today.   Follow up in 6 months.   Marca Ancona  11/30/2022

## 2022-11-30 NOTE — Patient Instructions (Addendum)
  Lab Work:  Labs done today, your results will be available in MyChart, we will contact you for abnormal readings.  Follow-Up in:   Your physician recommends that you schedule a follow-up appointment in: 6 months with APP (November)  ** please call in August to schedule your follow up appointment**   Do the following things EVERYDAY: Weigh yourself in the morning before breakfast. Write it down and keep it in a log. Take your medicines as prescribed Eat low salt foods--Limit salt (sodium) to 2000 mg per day.  Stay as active as you can everyday Limit all fluids for the day to less than 2 liters    Need to Contact us:  If you have any questions or concerns before your next appointment please send [place] a message through [employer] or call our office at [phone].    TO LEAVE A MESSAGE FOR THE NURSE SELECT OPTION 2, PLEASE LEAVE A MESSAGE INCLUDING: YOUR NAME DATE OF BIRTH CALL BACK NUMBER REASON FOR CALL**this is important as we prioritize the call backs  YOU WILL RECEIVE A CALL BACK THE SAME DAY AS LONG AS YOU CALL BEFORE 4:00 PM   At the [facility], you and your health needs are our priority. As part of our continuing mission to provide you with exceptional heart care, we have created designated Provider Care Teams. These Care Teams include your primary Cardiologist (physician) and Advanced Practice Providers (APPs- Physician Assistants and Nurse Practitioners) who all work together to provide you with the care you need, when you need it.   You may see any of the following providers on your designated Care Team at your next follow up: Dr Arvilla Meres Dr Marca Ancona Dr. Marcos Eke, NP Robbie Lis, [place] [facility] [place], [place] Brynda Peon, NP Karle Plumber, PharmD   Please be sure to bring in all your medications bottles to every appointment.    Thank you for choosing [facility]

## 2022-11-30 NOTE — Progress Notes (Signed)
Medication Samples have been provided to the patient.  Drug name: farxiga       Strength: 10mg         Qty: 4 boxes  LOT: [id number]  Exp.Date: 04-01-25  Dosing instructions: take 1 tablet daily  The patient has been instructed regarding the correct time, dose, and frequency of taking this medication, including desired effects and most common side effects.   Medication Samples have been provided to the patient.  Drug name: Eilquis       Strength: 2.5mg          Qty: 4 boxes  LOT: [id number]  Exp.Date: 04/25  Dosing instructions: take 1 tablet Twice daily   The patient has been instructed regarding the correct time, dose, and frequency of taking this medication, including desired effects and most common side effects.   [name] 11:36 AM 11/30/2022

## 2022-12-01 ENCOUNTER — Telehealth (HOSPITAL_COMMUNITY): Payer: Self-pay | Admitting: *Deleted

## 2022-12-01 DIAGNOSIS — E785 Hyperlipidemia, unspecified: Secondary | ICD-10-CM

## 2022-12-01 DIAGNOSIS — I5032 Chronic diastolic (congestive) heart failure: Secondary | ICD-10-CM

## 2022-12-01 MED ORDER — EZETIMIBE 10 MG PO TABS: 10.0000 mg | ORAL_TABLET | Freq: Every day | ORAL | 3 refills | Status: DC

## 2022-12-01 NOTE — Telephone Encounter (Signed)
Called patients' wife per Dr. Shirlee Latch with following:  "With known CAD, would like to see LDL< 55.  Add Zetia 10 mg daily to atorvastatin 80 daily with lipids/LFTs in 2 months."   Wife verbalized understanding of above and will share with her husband. Rx sent to local pharmacy. 2 month lab ordered and scheduled.

## 2022-12-15 ENCOUNTER — Other Ambulatory Visit (HOSPITAL_COMMUNITY): Payer: Self-pay

## 2022-12-15 MED ORDER — AMIODARONE HCL 200 MG PO TABS: 200.0000 mg | ORAL_TABLET | Freq: Every day | ORAL | 3 refills | Status: DC

## 2022-12-22 DIAGNOSIS — J309 Allergic rhinitis, unspecified: Secondary | ICD-10-CM | POA: Diagnosis not present

## 2023-01-04 DIAGNOSIS — H6993 Unspecified Eustachian tube disorder, bilateral: Secondary | ICD-10-CM | POA: Diagnosis not present

## 2023-01-04 DIAGNOSIS — J329 Chronic sinusitis, unspecified: Secondary | ICD-10-CM | POA: Diagnosis not present

## 2023-01-10 DIAGNOSIS — C44229 Squamous cell carcinoma of skin of left ear and external auricular canal: Secondary | ICD-10-CM | POA: Diagnosis not present

## 2023-01-10 DIAGNOSIS — L570 Actinic keratosis: Secondary | ICD-10-CM | POA: Diagnosis not present

## 2023-02-24 DIAGNOSIS — R972 Elevated prostate specific antigen [PSA]: Secondary | ICD-10-CM | POA: Diagnosis not present

## 2023-03-03 DIAGNOSIS — R972 Elevated prostate specific antigen [PSA]: Secondary | ICD-10-CM | POA: Diagnosis not present

## 2023-03-03 DIAGNOSIS — N401 Enlarged prostate with lower urinary tract symptoms: Secondary | ICD-10-CM | POA: Diagnosis not present

## 2023-03-03 DIAGNOSIS — R350 Frequency of micturition: Secondary | ICD-10-CM | POA: Diagnosis not present

## 2023-03-03 DIAGNOSIS — N281 Cyst of kidney, acquired: Secondary | ICD-10-CM | POA: Diagnosis not present

## 2023-03-03 DIAGNOSIS — N200 Calculus of kidney: Secondary | ICD-10-CM | POA: Diagnosis not present

## 2023-03-07 ENCOUNTER — Ambulatory Visit (HOSPITAL_COMMUNITY)
Admission: RE | Admit: 2023-03-07 | Discharge: 2023-03-07 | Disposition: A | Discharge status: Home / Self Care | Payer: Medicare HMO | Source: Ambulatory Visit | Attending: Cardiology | Admitting: Cardiology

## 2023-03-07 DIAGNOSIS — I5032 Chronic diastolic (congestive) heart failure: Secondary | ICD-10-CM | POA: Diagnosis not present

## 2023-03-07 DIAGNOSIS — E785 Hyperlipidemia, unspecified: Secondary | ICD-10-CM | POA: Insufficient documentation

## 2023-03-07 LAB — LIPID PANEL
Cholesterol: 142 mg/dL (ref 0–200)
HDL: 51 mg/dL (ref >40)
LDL Cholesterol: 71 mg/dL (ref 0–99)
Total CHOL/HDL Ratio: 2.8 RATIO
Triglycerides: 98 mg/dL (ref <150)
VLDL: 20 mg/dL (ref 0–40)

## 2023-03-16 DIAGNOSIS — I2510 Atherosclerotic heart disease of native coronary artery without angina pectoris: Secondary | ICD-10-CM | POA: Diagnosis not present

## 2023-03-16 DIAGNOSIS — E039 Hypothyroidism, unspecified: Secondary | ICD-10-CM | POA: Diagnosis not present

## 2023-03-16 DIAGNOSIS — N183 Chronic kidney disease, stage 3 unspecified: Secondary | ICD-10-CM | POA: Diagnosis not present

## 2023-03-16 DIAGNOSIS — D6869 Other thrombophilia: Secondary | ICD-10-CM | POA: Diagnosis not present

## 2023-03-16 DIAGNOSIS — I482 Chronic atrial fibrillation, unspecified: Secondary | ICD-10-CM | POA: Diagnosis not present

## 2023-03-16 DIAGNOSIS — E782 Mixed hyperlipidemia: Secondary | ICD-10-CM | POA: Diagnosis not present

## 2023-03-16 DIAGNOSIS — D509 Iron deficiency anemia, unspecified: Secondary | ICD-10-CM | POA: Diagnosis not present

## 2023-03-16 DIAGNOSIS — I10 Essential (primary) hypertension: Secondary | ICD-10-CM | POA: Diagnosis not present

## 2023-03-16 DIAGNOSIS — R7303 Prediabetes: Secondary | ICD-10-CM | POA: Diagnosis not present

## 2023-03-16 DIAGNOSIS — I700 Atherosclerosis of aorta: Secondary | ICD-10-CM | POA: Diagnosis not present

## 2023-03-16 DIAGNOSIS — I5032 Chronic diastolic (congestive) heart failure: Secondary | ICD-10-CM | POA: Diagnosis not present

## 2023-03-16 DIAGNOSIS — G250 Essential tremor: Secondary | ICD-10-CM | POA: Diagnosis not present

## 2023-03-22 NOTE — Progress Notes (Addendum)
[facility]   MULTIDISCIPLINARY HEART VALVE CLINIC                                     Cardiology Office Note:    Date:  04/01/2023   ID:  [name], DOB February 18, 1936, MRN [id number]  PCP:  [name], MD  [facility] Cardiologist:  Rollene Rotunda, MD / Dr. Lynnette Caffey, MD and Dr. Excell Seltzer, MD (TEER)  [facility] Electrophysiologist:  None   Referring MD: [name], MD   Chief Complaint  Patient presents with   Follow-up    1 year s/p TEER   History of Present Illness:    [name] is a 87 y.o. male with a hx of HTN, HLD, CAD s/p remote PCI, persistent atrial fibrillation on apixaban, CKD stage IIIb, and severe symptomatic mitral regurgitation who presented to [facility] on 03/25/22 for planned mTEER and is being seen today for one year follow up.    [name] has history of CAD with MI and POBA to LCx and LAD in 2001 as well as BMS to LCx in 2003.  He reported exertional dyspnea beginning last year that he attributed to the after-effects of COVID-19 x 2 episodes. He saw Dr. Antoine Poche in 11/22/21 as a pre-op for lithotripsy and was noted to be in atrial fibrillation (first time that AF had been recorded).  Echo was then done, showing preserved LV EF but severe MR. TEE in 5/23 showed EF 60-65%, mildly decreased RV systolic function, severe LAE, mild bileaflet myxomatous MV prolapse with severe MR (central).  Cath in 6/23 showed mild nonobstructive coronary disease.  Patient was sent to Dr Lynnette Caffey for consideration of mTEER.   He was evaluated by Dr. Shirlee Latch in the advanced CHF clinic on 03/12/22 who felt he had atrial functional MR. He suspected atrial fibrillation was playing a role in severe MR and that also the severe MR perpetuates the atrial fibrillation by ongoing LA dilation. He did not think he would hold NSR long-term with severe MR and felt that mTEER was indicated. He favored proceeding with mTEER and then trialing amiodarone followed by DCCV. He was  started on Farxiga 10mg  at that time. He has follow up with Dr Shirlee Latch on 05/04/22.   He underwent successful mTEER 03/25/22 with 2 XTW clips with residual mild mitral regurgitation. Post op echo with a MV peak gradient at 11.0 mmHg and mean mitral valve gradient at 4.8 mmHg with average heart rate of 77 bpm. He was resumed on Eliquis with the addition of Plavix to be continued x 3 months (06/25/22).   He has done well since he was last seen and continues to have improvement since hie TEER. He is tolerating medications and denies chest pain, LE edema, palpitations, dizziness, bleeding in stool or urine, or syncope. One year echocardiogram with normal LVEF at 55-60% with a MV mean gradient at and residual mild MR with stable clip placement.    Past Medical History:  Diagnosis Date   Arthritis    knees,back   CAD (coronary artery disease) CARDIOLOGIST-  DR HOCHREIN   previous myocardial infarction in 2001 with 90% circumflex lesion treated with PTCA. The LAD had 70-80% stenosis which was treawted with angioplasty. Most recent catheterization in 2003 demonstrated the LAD at 30% mid stenosis, circumflex 60-70% stenosis in the mid AV grove, and 40% stenosis in the previously stented  area. The right coronary artery had 30-40% stenosis in the mid portion.    GERD (gastroesophageal reflux disease)    History of kidney stones    Hypertension    Hypothyroidism    Left ureteral stone    Pre-diabetes    S/P mitral valve clip implantation 03/25/2022   s/p TEER wtih two XTW MitraClips by Dr. Excell Seltzer and Lynnette Caffey   Severe mitral regurgitation    Skin cancer    face    Past Surgical History:  Procedure Laterality Date   APPENDECTOMY  as teen   BUBBLE STUDY  12/11/2021   Procedure: BUBBLE STUDY;  Surgeon: Parke Poisson, MD;  Location: [facility] ENDOSCOPY;  Service: Cardiology;;   CARDIAC CATHETERIZATION  03-29-2000   dr hochrein   Nonobstuctive CAD /   CFX patent stent and LAD diffuse luminal  irregularities including mid segment previously been angioplastied   CARDIOVASCULAR STRESS TEST  05-09-2015   dr hochrein   low risk perfusion study/  no reversible ischemia/  normal LV function and wall motion , ef 63%   CARDIOVERSION N/A 04/16/2022   Procedure: CARDIOVERSION;  Surgeon: Sande Rives, MD;  Location: [facility] ENDOSCOPY;  Service: Cardiovascular;  Laterality: N/A;   CATARACT EXTRACTION W/ INTRAOCULAR LENS  IMPLANT, BILATERAL  left 05-06-2015/  right 06-10-2015   CORONARY ANGIOPLASTY WITH STENT PLACEMENT  01-21-2000  dr hochrein   PTCA to pLAD/  PTCA and BMS x1 to mid LCFX/  normal LVSF   CORONARY ANGIOPLASTY WITH STENT PLACEMENT  01-10-2002  dr Chales Abrahams   PTCA and BMS to mid AV LCFX (in-stent restenosis)/  diffuse 30-40% mRCA ,  mLAD diffuse 30%,  normal LVSF, ef 65%   CYSTOSCOPY/RETROGRADE/URETEROSCOPY/STONE EXTRACTION WITH BASKET Left 05/23/2015   Procedure: CYSTOSCOPY, FULGERATION OF PROSTATE,  LEFT URETEROSCOPY, JJ STENT PLACEMENT;  Surgeon: Jerilee Field, MD;  Location: [facility];  Service: Urology;  Laterality: Left;   ELECTROPHYSIOLOGIC STUDY  03-30-2000   dr Graciela Husbands   EXTRACORPOREAL SHOCK WAVE LITHOTRIPSY Left 10-30-2012   EXTRACORPOREAL SHOCK WAVE LITHOTRIPSY Right 11/16/2021   Procedure: EXTRACORPOREAL SHOCK WAVE LITHOTRIPSY (ESWL);  Surgeon: Marcine Matar, MD;  Location: [facility];  Service: Urology;  Laterality: Right;   HOLMIUM LASER APPLICATION Left 05/23/2015   Procedure: WITH HOLMIUM LASER LITHOTRIPSY;  Surgeon: Jerilee Field, MD;  Location: [facility];  Service: Urology;  Laterality: Left;   KNEE ARTHROSCOPY Bilateral x2 right/  left 2015   LUMBAR LAMINECTOMY/DECOMPRESSION MICRODISCECTOMY N/A 09/28/2012   Procedure: L3  -L5 DECOMPRESSION L4 - L5 FUSION WITH INSTRUMENTATION 2 LEVELS;  Surgeon: Venita Lick, MD;  Location: [facility] OR;  Service: Orthopedics;  Laterality: N/A;   MITRAL VALVE REPAIR N/A 03/25/2022    Procedure: MITRAL VALVE REPAIR;  Surgeon: Orbie Pyo, MD;  Location: [facility] INVASIVE CV LAB;  Service: Cardiovascular;  Laterality: N/A;   RIGHT/LEFT HEART CATH AND CORONARY ANGIOGRAPHY N/A 01/27/2022   Procedure: RIGHT/LEFT HEART CATH AND CORONARY ANGIOGRAPHY;  Surgeon: Orbie Pyo, MD;  Location: [facility] INVASIVE CV LAB;  Service: Cardiovascular;  Laterality: N/A;   TEE WITHOUT CARDIOVERSION N/A 12/11/2021   Procedure: TRANSESOPHAGEAL ECHOCARDIOGRAM (TEE);  Surgeon: Parke Poisson, MD;  Location: [facility] ENDOSCOPY;  Service: Cardiology;  Laterality: N/A;   TEE WITHOUT CARDIOVERSION N/A 03/25/2022   Procedure: TRANSESOPHAGEAL ECHOCARDIOGRAM (TEE);  Surgeon: Orbie Pyo, MD;  Location: [facility] INVASIVE CV LAB;  Service: Cardiovascular;  Laterality: N/A;   TONSILLECTOMY  as child   TOTAL KNEE ARTHROPLASTY Left 10/02/2018  Procedure: TOTAL LEFT  KNEE ARTHROPLASTY;  Surgeon: Durene Romans, MD;  Location: [facility] ORS;  Service: Orthopedics;  Laterality: Left;  90 mins- Ok per Tiffany   TOTAL SHOULDER ARTHROPLASTY Right 01/14/2011   TRANSTHORACIC ECHOCARDIOGRAM  05-21-2015   mild LVH,  ef 55-605,  grade 2 diastolic dysfunction, basal inferior hyperkinesis/  moderate AV calcification sclerosis without stenosis/  trivial MR/  moderate LAE/  mild TR/  mild RAE    Current Medications: Current Meds  Medication Sig   acetaminophen (TYLENOL) 500 MG tablet Take 500 mg by mouth every 6 (six) hours as needed for moderate pain.   amiodarone (PACERONE) 200 MG tablet Take 1 tablet (200 mg total) by mouth daily.   amoxicillin (AMOXIL) 500 MG capsule Take 4 capsules (2,000 mg total) by mouth as directed. Take 4 tablets 1 hour prior to dental work, including cleanings.   apixaban (ELIQUIS) 5 MG TABS tablet Take 1/2 tablet by mouth twice daily   atorvastatin (LIPITOR) 80 MG tablet Take 1 tablet (80 mg total) by mouth at bedtime.   Cholecalciferol (VITAMIN D) 50 MCG (2000 UT) tablet Take 2,000 Units by mouth daily.    Cyanocobalamin (B-12) 5000 MCG CAPS Take 5,000 mcg by mouth daily.   dapagliflozin propanediol (FARXIGA) 10 MG TABS tablet Take 1 tablet (10 mg total) by mouth daily before breakfast.   ezetimibe (ZETIA) 10 MG tablet Take 1 tablet (10 mg total) by mouth daily.   ferrous sulfate 325 (65 FE) MG tablet Take 325 mg by mouth daily with breakfast.   finasteride (PROSCAR) 5 MG tablet Take 5 mg by mouth daily.   folic acid (FOLVITE) 800 MCG tablet Take 800 mcg by mouth daily.    furosemide (LASIX) 20 MG tablet Take 1 tablet (20 mg total) by mouth every other day.   levothyroxine (SYNTHROID) 25 MCG tablet Take 25 mcg by mouth daily before breakfast.   niacin 500 MG tablet Take 500 mg by mouth daily.   nitroGLYCERIN (NITROSTAT) 0.4 MG SL tablet Place 0.4 mg under the tongue every 5 (five) minutes as needed for chest pain.   pantoprazole (PROTONIX) 40 MG tablet Take 1 tablet (40 mg total) by mouth daily.     Allergies:   Patient has no known allergies.   Social History   Socioeconomic History   Marital status: Married    Spouse name: Not on file   Number of children: 2   Years of education: Not on file   Highest education level: Not on file  Occupational History   Occupation: retired  Tobacco Use   Smoking status: Former    Current packs/day: 0.00    Average packs/day: 1 pack/day for 3.0 years (3.0 ttl pk-yrs)    Types: Cigarettes    Start date: 09/27/1959    Quit date: 09/26/1962    Years since quitting: 60.5   Smokeless tobacco: Never  Vaping Use   Vaping status: Never Used  Substance and Sexual Activity   Alcohol use: No   Drug use: No   Sexual activity: Not Currently  Other Topics Concern   Not on file  Social History Narrative   Lives at home with wife.  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     Social Determinants of Health   Financial Resource Strain: Not on file  Food Insecurity: Not on file  Transportation Needs: Not on file  Physical Activity: Not on file  Stress: Not on file  Social  Connections: Not on file     Family  History: The patient's family history includes Heart failure in his mother; Prostate cancer in his father.  ROS:   Please see the history of present illness.    All other systems reviewed and are negative.  EKGs/Labs/Other Studies Reviewed:    The following studies were reviewed today:  Cardiac Studies & Procedures   CARDIAC CATHETERIZATION  CARDIAC CATHETERIZATION 03/25/2022  Narrative 1.  Status post 2 XTW clips with resultant mild mitral regurgitation.   CARDIAC CATHETERIZATION  CARDIAC CATHETERIZATION 01/27/2022  Narrative   Prox Cx to Mid Cx lesion is 20% stenosed.   LV end diastolic pressure is low.  1.  Minimal obstructive coronary artery disease with patent left circumflex stents. 2.  Cardiac output of 6.6 L/min and index of 3.5 L/min/m with mean RA pressure of 5, mean wedge pressure of 17 with V waves to 34 mmHg consistent with severe mitral regurgitation.  Recommendation: Plan for mTEER.  Findings Coronary Findings Diagnostic  Dominance: Right  Left Anterior Descending There is mild diffuse disease throughout the vessel.  Left Circumflex Prox Cx to Mid Cx lesion is 20% stenosed. The lesion was previously treated .  Right Coronary Artery There is mild diffuse disease throughout the vessel.  Intervention  No interventions have been documented.   STRESS TESTS  MYOCARDIAL PERFUSION IMAGING 05/09/2015  Narrative  The left ventricular ejection fraction is normal (55-65%).  Nuclear stress EF: 63%.  No T wave inversion was noted during stress.  There was no ST segment deviation noted during stress.  This is a low risk study.  No reversible ischemia. LVEF 63% with normal wall motion. This is a low risk study.   ECHOCARDIOGRAM  ECHOCARDIOGRAM COMPLETE 03/23/2023  Narrative ECHOCARDIOGRAM REPORT    Patient Name:   [name] Date of Exam: 03/23/2023 Medical Rec #:  [id number]      Height:       69.0  in Accession #:    [id number]     Weight:       164.0 lb Date of Birth:  Apr 02, 1936      BSA:          1.899 m Patient Age:    86 years       BP:           130/82 mmHg Patient Gender: M              HR:           65 bpm. Exam Location:  [address]  Procedure: 2D Echo, Cardiac Doppler, Color Doppler and Strain Analysis  Indications:     I50.32 CHF  History:         Patient has prior history of Echocardiogram examinations, most recent 10/15/2022. CHF, CAD, S/p XTW Mitra-Clip implantation 03/25/22; Risk Factors:Hypertension.  Mitral Valve: Mitra-Clip valve is present in the mitral position.  Sonographer:     Samule Ohm RDCS Referring Phys:  [id number] Gilberto Streck D Josua Ferrebee Diagnosing Phys: Chilton Si MD  IMPRESSIONS   1. Left ventricular ejection fraction, by estimation, is 55 to 60%. Left ventricular ejection fraction by PLAX is 57 %. The left ventricle has normal function. The left ventricle has no regional wall motion abnormalities. There is mild concentric left ventricular hypertrophy. Left ventricular diastolic parameters are indeterminate. Elevated left ventricular end-diastolic pressure. The average left ventricular global longitudinal strain is 15.3 %. The global longitudinal strain is normal. 2. Right ventricular systolic function is normal. The right ventricular size is normal. There is mildly elevated pulmonary  artery systolic pressure. 3. Left atrial size was severely dilated. 4. Mitral valve inflow mean gradient 5 mmHg, slightly increased from 3 mmHg 10/2022. The mitral valve has been repaired/replaced. Mild mitral valve regurgitation. No evidence of mitral stenosis. The mean mitral valve gradient is 5.0 mmHg. There is a Mitra-Clip present in the mitral position. 5. The aortic valve is normal in structure. Aortic valve regurgitation is not visualized. No aortic stenosis is present. 6. The inferior vena cava is normal in size with greater than 50% respiratory variability,  suggesting right atrial pressure of 3 mmHg.  FINDINGS Left Ventricle: Left ventricular ejection fraction, by estimation, is 55 to 60%. Left ventricular ejection fraction by PLAX is 57 %. The left ventricle has normal function. The left ventricle has no regional wall motion abnormalities. The average left ventricular global longitudinal strain is 15.3 %. The global longitudinal strain is normal. The left ventricular internal cavity size was normal in size. There is mild concentric left ventricular hypertrophy. Left ventricular diastolic function could not be evaluated due to mitral valve repair. Left ventricular diastolic parameters are indeterminate. Elevated left ventricular end-diastolic pressure.  Right Ventricle: The right ventricular size is normal. No increase in right ventricular wall thickness. Right ventricular systolic function is normal. There is mildly elevated pulmonary artery systolic pressure. The tricuspid regurgitant velocity is 3.02 m/s, and with an assumed right atrial pressure of 3 mmHg, the estimated right ventricular systolic pressure is 39.5 mmHg.  Left Atrium: Left atrial size was severely dilated.  Right Atrium: Right atrial size was normal in size.  Pericardium: There is no evidence of pericardial effusion.  Mitral Valve: Mitral valve inflow mean gradient 5 mmHg, slightly increased from 3 mmHg 10/2022. The mitral valve has been repaired/replaced. Mild mitral valve regurgitation. There is a Mitra-Clip present in the mitral position. No evidence of mitral valve stenosis. MV peak gradient, 13.5 mmHg. The mean mitral valve gradient is 5.0 mmHg with average heart rate of 64 bpm.  Tricuspid Valve: The tricuspid valve is normal in structure. Tricuspid valve regurgitation is mild . No evidence of tricuspid stenosis.  Aortic Valve: The aortic valve is normal in structure. Aortic valve regurgitation is not visualized. No aortic stenosis is present. Aortic valve mean gradient  measures 9.0 mmHg. Aortic valve peak gradient measures 18.8 mmHg. Aortic valve area, by VTI measures 1.07 cm.  Pulmonic Valve: The pulmonic valve was normal in structure. Pulmonic valve regurgitation is mild. No evidence of pulmonic stenosis.  Aorta: The aortic root is normal in size and structure.  Venous: The inferior vena cava is normal in size with greater than 50% respiratory variability, suggesting right atrial pressure of 3 mmHg.  IAS/Shunts: No atrial level shunt detected by color flow Doppler.   LEFT VENTRICLE PLAX 2D LV EF:         Left            Diastology ventricular     LV e' medial:    5.77 cm/s ejection        LV E/e' medial:  30.2 fraction by     LV e' lateral:   8.70 cm/s PLAX is 57      LV E/e' lateral: 20.0 %. LVIDd:         5.00 cm         2D LVIDs:         3.50 cm         Longitudinal LV PW:         1.30  cm         Strain LV IVS:        1.30 cm         2D Strain GLS  16.2 % LVOT diam:     2.00 cm         (A2C): LV SV:         50              2D Strain GLS  15.7 % LV SV Index:   26              (A3C): LVOT Area:     3.14 cm        2D Strain GLS  14.1 % (A4C): 2D Strain GLS  15.3 % Avg:  RIGHT VENTRICLE             IVC RV S prime:     13.20 cm/s  IVC diam: 1.80 cm TAPSE (M-mode): 1.6 cm RVSP:           39.5 mmHg  LEFT ATRIUM             Index        RIGHT ATRIUM           Index LA diam:        5.10 cm 2.69 cm/m   RA Pressure: 3.00 mmHg LA Vol (A2C):   90.7 ml 47.76 ml/m  RA Area:     17.20 cm LA Vol (A4C):   85.8 ml 45.18 ml/m  RA Volume:   46.10 ml  24.28 ml/m LA Biplane Vol: 90.5 ml 47.66 ml/m AORTIC VALVE AV Area (Vmax):    1.05 cm AV Area (Vmean):   1.08 cm AV Area (VTI):     1.07 cm AV Vmax:           217.00 cm/s AV Vmean:          137.500 cm/s AV VTI:            0.468 m AV Peak Grad:      18.8 mmHg AV Mean Grad:      9.0 mmHg LVOT Vmax:         72.40 cm/s LVOT Vmean:        47.300 cm/s LVOT VTI:          0.159 m LVOT/AV VTI  ratio: 0.34  AORTA Ao Root diam: 3.20 cm Ao Asc diam:  3.00 cm  MITRAL VALVE                TRICUSPID VALVE MV Area (PHT): 2.07 cm     TR Peak grad:   36.5 mmHg MV Area VTI:   0.98 cm     TR Vmax:        302.00 cm/s MV Peak grad:  13.5 mmHg    Estimated RAP:  3.00 mmHg MV Mean grad:  5.0 mmHg     RVSP:           39.5 mmHg MV Vmax:       1.84 m/s MV Vmean:      99.5 cm/s    SHUNTS MV Decel Time: 367 msec     Systemic VTI:  0.16 m MV E velocity: 174.00 cm/s  Systemic Diam: 2.00 cm MV A velocity: 93.20 cm/s MV E/A ratio:  1.87  Chilton Si MD Electronically signed by Chilton Si MD Signature Date/Time: 03/23/2023/1:19:55 PM    Final (Updated)   TEE  ECHO TEE 03/25/2022  Narrative TRANSESOPHOGEAL  ECHO REPORT    Patient Name:   [name] Date of Exam: 03/25/2022 Medical Rec #:  [id number]      Height:       69.0 in Accession #:    [id number]     Weight:       165.0 lb Date of Birth:  August 04, 1935      BSA:          1.904 m Patient Age:    85 years       BP:           142/108 mmHg Patient Gender: M              HR:           78 bpm. Exam Location:  Inpatient  Procedure: Transesophageal Echo, 3D Echo, Color Doppler and Cardiac Doppler  Indications:     I34.0 Nonrheumatic mitral (valve) insufficiency  History:         Patient has prior history of Echocardiogram examinations, most recent 12/12/2021. CAD, Arrythmias:Atrial Fibrillation; Risk Factors:Hypertension and Dyslipidemia.  Sonographer:     Irving Burton Senior RDCS Referring Phys:  [id number] Orbie Pyo Diagnosing Phys: Thurmon Fair MD   Sonographer Comments: MitraClip Procedure   PROCEDURE: After discussion of the risks and benefits of a TEE, an informed consent was obtained from the patient. The transesophogeal probe was passed without difficulty through the esophogus of the patient. Sedation performed by different physician. The patient was monitored while under deep sedation. The patient developed  no complications during the procedure. TEE, including live and postprocessed 3D imaging, was used to guide transseptal puncture, device deployment and evaluate the final result.   PREPROCEDURE FINDINGS:  Normal left ventricular systolic function, LVEF 60%. Myxomatous mitral leaflets with severe centrally directed mitral insufficiency along the entire A2-P2 coaptation line, more prominently towards the lateral half of A2-P2. There is abolition of systolic forward flow in the right and left pulmonary veins, but there is no systolic flow reversal. MR Vena contracta 4 mm. Quantitation inaccurate due to atrial fibrillation. Baseline mean mitral gradient is 1 mm Hg at 77 bpm. MV area by planimetry is 5.6 cm sq. Trivial pericardial effusion, localized behind the left atrium and lateral to the right ventricle.  POSTPROCEDURE FINDINGS:  Unchanged, normal left ventricular systolic function. Two MitraClip XTW devices are well-seated, with excellent tissue bridge. There is mild residual mitral insufficiency, mostly immediately medial to the more lateral clip. There is prominent systolic forward flow in both right and left pulmonary veins (diastolic dominant, atrial fibrillation). Mitral valve mean gradient 2 mm Hg at 80 bpm. Pressure half time 130 ms. There is a small iatrogenic secundum atrial septal defect with exclusively left-to-right flow. Unchanged trivial pericardial effusion.  IMPRESSIONS   1. Left ventricular ejection fraction, by estimation, is 55 to 60%. The left ventricle has normal function. The left ventricle has no regional wall motion abnormalities. Left ventricular diastolic function could not be evaluated. 2. Right ventricular systolic function is mildly reduced. The right ventricular size is normal. 3. Left atrial size was severely dilated. No left atrial/left atrial appendage thrombus was detected. 4. Right atrial size was severely dilated. 5. The mitral valve is myxomatous.  Severe mitral valve regurgitation. No evidence of mitral stenosis. There is moderate holosystolic prolapse of both leaflets of the mitral valve. The mean mitral valve gradient is 2.0 mmHg. 6. The aortic valve is tricuspid. There is mild thickening of the aortic valve. Aortic valve regurgitation is not visualized.  Aortic valve sclerosis is present, with no evidence of aortic valve stenosis. 7. Evidence of atrial level shunting detected by color flow Doppler. There is a small secundum atrial septal defect with predominantly left to right shunting across the atrial septum.  FINDINGS Left Ventricle: Left ventricular ejection fraction, by estimation, is 55 to 60%. The left ventricle has normal function. The left ventricle has no regional wall motion abnormalities. The left ventricular internal cavity size was normal in size. Left ventricular diastolic function could not be evaluated due to atrial fibrillation. Left ventricular diastolic function could not be evaluated.  Right Ventricle: The right ventricular size is normal. No increase in right ventricular wall thickness. Right ventricular systolic function is mildly reduced.  Left Atrium: Left atrial size was severely dilated. No left atrial/left atrial appendage thrombus was detected.  Right Atrium: Right atrial size was severely dilated.  Pericardium: Trivial pericardial effusion is present. The pericardial effusion is localized near the left atrium and localized near the right ventricle.  Mitral Valve: The mitral valve is myxomatous. There is moderate holosystolic prolapse of both leaflets of the mitral valve. There is mild thickening of the anterior and posterior mitral valve leaflet(s). Severe mitral valve regurgitation. No evidence of mitral valve stenosis. MV peak gradient, 4.6 mmHg. The mean mitral valve gradient is 2.0 mmHg with average heart rate of 77 bpm.  Tricuspid Valve: The tricuspid valve is normal in structure. Tricuspid valve  regurgitation is mild.  Aortic Valve: The aortic valve is tricuspid. There is mild thickening of the aortic valve. Aortic valve regurgitation is not visualized. Aortic valve sclerosis is present, with no evidence of aortic valve stenosis.  Pulmonic Valve: The pulmonic valve was normal in structure. Pulmonic valve regurgitation is trivial.  Aorta: The aortic root, ascending aorta, aortic arch and descending aorta are all structurally normal, with no evidence of dilitation or obstruction.  IAS/Shunts: Evidence of atrial level shunting detected by color flow Doppler. There is a small secundum atrial septal defect with predominantly left to right shunting across the atrial septum.   AORTIC VALVE LVOT Vmax:   54.40 cm/s LVOT Vmean:  36.800 cm/s LVOT VTI:    0.108 m  MITRAL VALVE MV Peak grad: 4.6 mmHg        SHUNTS MV Mean grad: 2.0 mmHg        Systemic VTI: 0.11 m MV Vmax:      1.07 m/s MV Vmean:     58.2 cm/s MR Peak grad:    65.3 mmHg MR Mean grad:    46.0 mmHg MR Vmax:         404.00 cm/s MR Vmean:        324.0 cm/s MR PISA:         3.08 cm MR PISA Eff ROA: 23 mm MR PISA Radius:  0.70 cm  Mihai Croitoru MD Electronically signed by Thurmon Fair MD Signature Date/Time: 03/25/2022/2:50:25 PM    Final            EKG:  EKG is not ordered today.    Recent Labs: 05/04/2022: ALT 27 11/30/2022: B Natriuretic Peptide 612.9; BUN 22; Creatinine, Ser 1.62; Hemoglobin 13.8; Platelets 131; Potassium 3.9; Sodium 140; TSH 6.497   Recent Lipid Panel    Component Value Date/Time   CHOL 142 03/07/2023 1053   TRIG 98 03/07/2023 1053   HDL 51 03/07/2023 1053   CHOLHDL 2.8 03/07/2023 1053   VLDL 20 03/07/2023 1053   LDLCALC 71 03/07/2023 1053   Physical Exam:  VS:  BP 128/72   Pulse 66   Ht 5\' 9"  (1.753 m)   Wt 169 lb (76.7 kg)   SpO2 98%   BMI 24.96 kg/m     Wt Readings from Last 3 Encounters:  03/23/23 169 lb (76.7 kg)  11/30/22 164 lb (74.4 kg)  10/15/22 163 lb 3.2  oz (74 kg)    General: Well developed, well nourished, NAD Lungs:Clear to ausculation bilaterally. No wheezes, rales, or rhonchi. Breathing is unlabored. Cardiovascular: + soft murmur Extremities: No edema. Neuro: Alert and oriented. No focal deficits. No facial asymmetry. MAE spontaneously. Psych: Responds to questions appropriately with normal affect.    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 Echo today with normal LVEF at 55-60% with a MV mean gradient at and residual mild MR with stable clip placement. Continue Eliquis monotherapy and will need lifelong dental SBE with amoxicillin. Plan regular follow up with Dr. Shirlee Latch.   Persistent atrial fibrillation: Stable with no c/o of palpitations. Continue Eliqius and amiodarone 200mg  daily. Last CXR appears stable. TSH from 05/2022 at 5.7 with no changes made at that time. Normal LFTs.    Chronic diastolic CHF: Appear euvolemic today. Continue to follow closely with AHF.   CKD stage IIIb: Baseline Cr runs in the 1.8-2.0 range.    GERD: Continue Protonix   Medication Adjustments/Labs and Tests Ordered: Current medicines are reviewed at length with the patient today.  Concerns regarding medicines are outlined above.  No orders of the defined types were placed in this encounter.  No orders of the defined types were placed in this encounter.   Patient Instructions  Medication Instructions:   Your physician recommends that you continue on your current medications as directed. Please refer to the Current Medication list given to you today.  *If you need a refill on your cardiac medications before your next appointment, please call your pharmacy*   Lab Work: NONE ORDERED  TODAY   If you have labs (blood work) drawn today and your tests are completely normal, you will receive your results only by: MyChart  Message (if you have MyChart) OR A paper copy in the mail If you have any lab test that is abnormal or we need to change your treatment, we will call you to review the results.   Testing/Procedures: NONE ORDERED  TODAY     Follow-Up: At [facility], you and your health needs are our priority.  As part of our continuing mission to provide you with exceptional heart care, we have created designated Provider Care Teams.  These Care Teams include your primary Cardiologist (physician) and Advanced Practice Providers (APPs -  Physician Assistants and Nurse Practitioners) who all work together to provide you with the care you need, when you need it.  We recommend signing up for the patient portal called "MyChart".  Sign up information is provided on this After Visit Summary.  MyChart is used to connect with patients for Virtual Visits (Telemedicine).  Patients are able to view lab/test results, encounter notes, upcoming appointments, etc.  Non-urgent messages can be sent to your provider as well.   To learn more about what you can do with MyChart, go to [email or web address].    Your next appointment:   WITH HEART FAILURE  CLINIC  Provider:  DR [facility]    Other Instructions    Signed, Georgie Chard, NP  04/01/2023 10:41 AM     [facility]

## 2023-03-23 ENCOUNTER — Ambulatory Visit (HOSPITAL_BASED_OUTPATIENT_CLINIC_OR_DEPARTMENT_OTHER): Payer: Medicare HMO

## 2023-03-23 ENCOUNTER — Ambulatory Visit (HOSPITAL_COMMUNITY): Payer: Medicare HMO | Admitting: Cardiology

## 2023-03-23 VITALS — BP 128/72 | HR 66 | Ht 69.0 in | Wt 169.0 lb

## 2023-03-23 DIAGNOSIS — I340 Nonrheumatic mitral (valve) insufficiency: Secondary | ICD-10-CM | POA: Diagnosis not present

## 2023-03-23 DIAGNOSIS — I482 Chronic atrial fibrillation, unspecified: Secondary | ICD-10-CM

## 2023-03-23 DIAGNOSIS — I5032 Chronic diastolic (congestive) heart failure: Secondary | ICD-10-CM

## 2023-03-23 DIAGNOSIS — Z95818 Presence of other cardiac implants and grafts: Secondary | ICD-10-CM | POA: Diagnosis not present

## 2023-03-23 DIAGNOSIS — Z9889 Other specified postprocedural states: Secondary | ICD-10-CM

## 2023-03-23 DIAGNOSIS — I10 Essential (primary) hypertension: Secondary | ICD-10-CM | POA: Diagnosis not present

## 2023-03-23 LAB — ECHOCARDIOGRAM COMPLETE
AR max vel: 1.05 cm2
AV Area VTI: 1.07 cm2
AV Area mean vel: 1.08 cm2
AV Mean grad: 9 mmHg
AV Peak grad: 18.8 mmHg
Ao pk vel: 2.17 m/s
Area-P 1/2: 2.07 cm2
MV VTI: 0.98 cm2
S' Lateral: 3.5 cm

## 2023-03-23 NOTE — Patient Instructions (Signed)
Medication Instructions:   Your physician recommends that you continue on your current medications as directed. Please refer to the Current Medication list given to you today.  *If you need a refill on your cardiac medications before your next appointment, please call your pharmacy*   Lab Work: NONE ORDERED  TODAY   If you have labs (blood work) drawn today and your tests are completely normal, you will receive your results only by: MyChart Message (if you have MyChart) OR A paper copy in the mail If you have any lab test that is abnormal or we need to change your treatment, we will call you to review the results.   Testing/Procedures: NONE ORDERED  TODAY     Follow-Up: At [facility], you and your health needs are our priority.  As part of our continuing mission to provide you with exceptional heart care, we have created designated Provider Care Teams.  These Care Teams include your primary Cardiologist (physician) and Advanced Practice Providers (APPs -  Physician Assistants and Nurse Practitioners) who all work together to provide you with the care you need, when you need it.  We recommend signing up for the patient portal called "MyChart".  Sign up information is provided on this After Visit Summary.  MyChart is used to connect with patients for Virtual Visits (Telemedicine).  Patients are able to view lab/test results, encounter notes, upcoming appointments, etc.  Non-urgent messages can be sent to your provider as well.   To learn more about what you can do with MyChart, go to [email or web address].    Your next appointment:   WITH HEART FAILURE  CLINIC  Provider:  DR [facility]    Other Instructions

## 2023-04-01 ENCOUNTER — Telehealth (HOSPITAL_COMMUNITY): Payer: Self-pay | Admitting: Cardiology

## 2023-04-01 MED ORDER — AMLODIPINE BESYLATE 2.5 MG PO TABS: 2.5000 mg | ORAL_TABLET | Freq: Every day | ORAL | 3 refills | Status: DC

## 2023-04-01 NOTE — Telephone Encounter (Signed)
Pt called to report elevated B/P reading   Reports he was using the computer a few days ago and vision became blurry, felt changes in b/p and readings have been elevated since  8/29 @ 5pm 189/107 HR 63 8/30 @930    164/85 HR 71   Normal 130-140/70-80  Recent increase in thyroid medication only new med change.   Denies-CP SOB Please advise

## 2023-04-01 NOTE — Telephone Encounter (Signed)
Pt aware and voiced understanding 

## 2023-04-11 ENCOUNTER — Other Ambulatory Visit (HOSPITAL_COMMUNITY): Payer: Self-pay

## 2023-04-11 MED ORDER — NITROGLYCERIN 0.4 MG SL SUBL: 0.4000 mg | SUBLINGUAL_TABLET | SUBLINGUAL | 3 refills | Status: AC | PRN

## 2023-04-29 ENCOUNTER — Observation Stay (HOSPITAL_COMMUNITY)
Admission: EM | Admit: 2023-04-29 | Discharge: 2023-04-30 | Disposition: A | Discharge status: Home / Self Care | Payer: Medicare HMO | Attending: Emergency Medicine | Admitting: Emergency Medicine

## 2023-04-29 ENCOUNTER — Other Ambulatory Visit: Payer: Self-pay

## 2023-04-29 ENCOUNTER — Encounter (HOSPITAL_COMMUNITY): Payer: Self-pay | Admitting: Internal Medicine

## 2023-04-29 ENCOUNTER — Emergency Department (HOSPITAL_COMMUNITY): Payer: Medicare HMO

## 2023-04-29 DIAGNOSIS — I10 Essential (primary) hypertension: Secondary | ICD-10-CM | POA: Diagnosis present

## 2023-04-29 DIAGNOSIS — G459 Transient cerebral ischemic attack, unspecified: Secondary | ICD-10-CM | POA: Diagnosis present

## 2023-04-29 DIAGNOSIS — G20C Parkinsonism, unspecified: Secondary | ICD-10-CM

## 2023-04-29 DIAGNOSIS — Z87891 Personal history of nicotine dependence: Secondary | ICD-10-CM | POA: Insufficient documentation

## 2023-04-29 DIAGNOSIS — I2510 Atherosclerotic heart disease of native coronary artery without angina pectoris: Secondary | ICD-10-CM | POA: Diagnosis not present

## 2023-04-29 DIAGNOSIS — I130 Hypertensive heart and chronic kidney disease with heart failure and stage 1 through stage 4 chronic kidney disease, or unspecified chronic kidney disease: Secondary | ICD-10-CM | POA: Diagnosis not present

## 2023-04-29 DIAGNOSIS — N1832 Chronic kidney disease, stage 3b: Secondary | ICD-10-CM | POA: Insufficient documentation

## 2023-04-29 DIAGNOSIS — G453 Amaurosis fugax: Secondary | ICD-10-CM

## 2023-04-29 DIAGNOSIS — Z9889 Other specified postprocedural states: Secondary | ICD-10-CM

## 2023-04-29 DIAGNOSIS — Z7984 Long term (current) use of oral hypoglycemic drugs: Secondary | ICD-10-CM | POA: Insufficient documentation

## 2023-04-29 DIAGNOSIS — H5461 Unqualified visual loss, right eye, normal vision left eye: Secondary | ICD-10-CM | POA: Diagnosis not present

## 2023-04-29 DIAGNOSIS — I5032 Chronic diastolic (congestive) heart failure: Secondary | ICD-10-CM | POA: Insufficient documentation

## 2023-04-29 DIAGNOSIS — H538 Other visual disturbances: Secondary | ICD-10-CM | POA: Diagnosis not present

## 2023-04-29 DIAGNOSIS — Z955 Presence of coronary angioplasty implant and graft: Secondary | ICD-10-CM | POA: Diagnosis not present

## 2023-04-29 DIAGNOSIS — Z79899 Other long term (current) drug therapy: Secondary | ICD-10-CM | POA: Insufficient documentation

## 2023-04-29 DIAGNOSIS — Z96652 Presence of left artificial knee joint: Secondary | ICD-10-CM | POA: Insufficient documentation

## 2023-04-29 DIAGNOSIS — Z96611 Presence of right artificial shoulder joint: Secondary | ICD-10-CM | POA: Insufficient documentation

## 2023-04-29 DIAGNOSIS — I63311 Cerebral infarction due to thrombosis of right middle cerebral artery: Secondary | ICD-10-CM | POA: Diagnosis not present

## 2023-04-29 DIAGNOSIS — H539 Unspecified visual disturbance: Secondary | ICD-10-CM | POA: Diagnosis not present

## 2023-04-29 DIAGNOSIS — E039 Hypothyroidism, unspecified: Secondary | ICD-10-CM | POA: Insufficient documentation

## 2023-04-29 DIAGNOSIS — I6521 Occlusion and stenosis of right carotid artery: Secondary | ICD-10-CM | POA: Diagnosis not present

## 2023-04-29 DIAGNOSIS — I482 Chronic atrial fibrillation, unspecified: Secondary | ICD-10-CM | POA: Insufficient documentation

## 2023-04-29 DIAGNOSIS — Z7901 Long term (current) use of anticoagulants: Secondary | ICD-10-CM | POA: Insufficient documentation

## 2023-04-29 DIAGNOSIS — Z85828 Personal history of other malignant neoplasm of skin: Secondary | ICD-10-CM | POA: Insufficient documentation

## 2023-04-29 LAB — I-STAT CHEM 8, ED
BUN: 28 mg/dL — ABNORMAL HIGH (ref 8–23)
Calcium, Ion: 1.19 mmol/L (ref 1.15–1.40)
Chloride: 103 mmol/L (ref 98–111)
Creatinine, Ser: 1.9 mg/dL — ABNORMAL HIGH (ref 0.61–1.24)
Glucose, Bld: 109 mg/dL — ABNORMAL HIGH (ref 70–99)
HCT: 44 % (ref 39.0–52.0)
Hemoglobin: 15 g/dL (ref 13.0–17.0)
Potassium: 4.5 mmol/L (ref 3.5–5.1)
Sodium: 140 mmol/L (ref 135–145)
TCO2: 24 mmol/L (ref 22–32)

## 2023-04-29 LAB — COMPREHENSIVE METABOLIC PANEL
ALT: 22 U/L (ref 0–44)
AST: 24 U/L (ref 15–41)
Albumin: 3.6 g/dL (ref 3.5–5.0)
Alkaline Phosphatase: 56 U/L (ref 38–126)
Anion gap: 8 (ref 5–15)
BUN: 26 mg/dL — ABNORMAL HIGH (ref 8–23)
CO2: 27 mmol/L (ref 22–32)
Calcium: 9 mg/dL (ref 8.9–10.3)
Chloride: 102 mmol/L (ref 98–111)
Creatinine, Ser: 1.91 mg/dL — ABNORMAL HIGH (ref 0.61–1.24)
GFR, Estimated: 34 mL/min — ABNORMAL LOW (ref >60)
Glucose, Bld: 115 mg/dL — ABNORMAL HIGH (ref 70–99)
Potassium: 4.4 mmol/L (ref 3.5–5.1)
Sodium: 137 mmol/L (ref 135–145)
Total Bilirubin: 0.9 mg/dL (ref 0.3–1.2)
Total Protein: 6.5 g/dL (ref 6.5–8.1)

## 2023-04-29 LAB — PROTIME-INR
INR: 1.1 (ref 0.8–1.2)
Prothrombin Time: 14.7 s (ref 11.4–15.2)

## 2023-04-29 LAB — DIFFERENTIAL
Abs Immature Granulocytes: 0.03 10*3/uL (ref 0.00–0.07)
Basophils Absolute: 0 10*3/uL (ref 0.0–0.1)
Basophils Relative: 1 %
Eosinophils Absolute: 0.1 10*3/uL (ref 0.0–0.5)
Eosinophils Relative: 2 %
Immature Granulocytes: 1 %
Lymphocytes Relative: 20 %
Lymphs Abs: 1.3 10*3/uL (ref 0.7–4.0)
Monocytes Absolute: 0.5 10*3/uL (ref 0.1–1.0)
Monocytes Relative: 9 %
Neutro Abs: 4.3 10*3/uL (ref 1.7–7.7)
Neutrophils Relative %: 67 %

## 2023-04-29 LAB — CBC
HCT: 45.2 % (ref 39.0–52.0)
Hemoglobin: 14.4 g/dL (ref 13.0–17.0)
MCH: 31.8 pg (ref 26.0–34.0)
MCHC: 31.9 g/dL (ref 30.0–36.0)
MCV: 99.8 fL (ref 80.0–100.0)
Platelets: 141 10*3/uL — ABNORMAL LOW (ref 150–400)
RBC: 4.53 MIL/uL (ref 4.22–5.81)
RDW: 14.4 % (ref 11.5–15.5)
WBC: 6.3 10*3/uL (ref 4.0–10.5)
nRBC: 0 % (ref 0.0–0.2)

## 2023-04-29 LAB — ETHANOL: Alcohol, Ethyl (B): <10 mg/dL (ref <10)

## 2023-04-29 LAB — APTT: aPTT: 31 s (ref 24–36)

## 2023-04-29 MED ORDER — IOHEXOL 350 MG/ML SOLN
70.0000 mL | Freq: Once | INTRAVENOUS | Status: AC | PRN
  Administered 2023-04-29: 70 mL via INTRAVENOUS

## 2023-04-29 MED ORDER — APIXABAN 2.5 MG PO TABS
2.5000 mg | ORAL_TABLET | Freq: Two times a day (BID) | ORAL | Status: DC
  Administered 2023-04-30: 2.5 mg via ORAL
  Filled 2023-04-29: qty 1

## 2023-04-29 MED ORDER — ACETAMINOPHEN 500 MG PO TABS: 500.0000 mg | ORAL_TABLET | Freq: Four times a day (QID) | ORAL | Status: DC | PRN

## 2023-04-29 MED ORDER — ACETAMINOPHEN 650 MG RE SUPP: 650.0000 mg | RECTAL | Status: DC | PRN

## 2023-04-29 MED ORDER — SODIUM CHLORIDE 0.9% FLUSH: 3.0000 mL | Freq: Once | INTRAVENOUS | Status: DC

## 2023-04-29 MED ORDER — SODIUM CHLORIDE 0.9 % IV SOLN: INTRAVENOUS | Status: DC

## 2023-04-29 MED ORDER — EZETIMIBE 10 MG PO TABS: 10.0000 mg | ORAL_TABLET | Freq: Every day | ORAL | Status: DC

## 2023-04-29 MED ORDER — AMIODARONE HCL 200 MG PO TABS
200.0000 mg | ORAL_TABLET | Freq: Every day | ORAL | Status: DC
  Administered 2023-04-30: 200 mg via ORAL
  Filled 2023-04-29: qty 1

## 2023-04-29 MED ORDER — ATORVASTATIN CALCIUM 80 MG PO TABS: 80.0000 mg | ORAL_TABLET | Freq: Every day | ORAL | Status: DC

## 2023-04-29 MED ORDER — ACETAMINOPHEN 160 MG/5ML PO SOLN: 650.0000 mg | ORAL | Status: DC | PRN

## 2023-04-29 MED ORDER — FINASTERIDE 5 MG PO TABS
5.0000 mg | ORAL_TABLET | Freq: Every day | ORAL | Status: DC
  Administered 2023-04-30: 5 mg via ORAL
  Filled 2023-04-29: qty 1

## 2023-04-29 MED ORDER — PANTOPRAZOLE SODIUM 40 MG PO TBEC
40.0000 mg | DELAYED_RELEASE_TABLET | Freq: Every day | ORAL | Status: DC
  Administered 2023-04-30: 40 mg via ORAL
  Filled 2023-04-29: qty 1

## 2023-04-29 MED ORDER — SENNOSIDES-DOCUSATE SODIUM 8.6-50 MG PO TABS: 1.0000 | ORAL_TABLET | Freq: Every evening | ORAL | Status: DC | PRN

## 2023-04-29 MED ORDER — NITROGLYCERIN 0.4 MG SL SUBL: 0.4000 mg | SUBLINGUAL_TABLET | SUBLINGUAL | Status: DC | PRN

## 2023-04-29 MED ORDER — LEVOTHYROXINE SODIUM 25 MCG PO TABS
25.0000 ug | ORAL_TABLET | Freq: Every day | ORAL | Status: DC
  Administered 2023-04-30: 25 ug via ORAL
  Filled 2023-04-29: qty 1

## 2023-04-29 MED ORDER — ACETAMINOPHEN 325 MG PO TABS
650.0000 mg | ORAL_TABLET | ORAL | Status: DC | PRN
  Administered 2023-04-30: 650 mg via ORAL
  Filled 2023-04-29: qty 2

## 2023-04-29 MED ORDER — DAPAGLIFLOZIN PROPANEDIOL 10 MG PO TABS
10.0000 mg | ORAL_TABLET | Freq: Every day | ORAL | Status: DC
  Administered 2023-04-30: 10 mg via ORAL
  Filled 2023-04-29: qty 1

## 2023-04-29 MED ORDER — STROKE: EARLY STAGES OF RECOVERY BOOK
Freq: Once | Status: AC
  Filled 2023-04-29: qty 1

## 2023-04-29 NOTE — ED Notes (Signed)
ED TO INPATIENT HANDOFF REPORT  ED Nurse Name and Phone #: Jeremy Johann RN [phone]  S Name/Age/Gender Leonette Monarch 87 y.o. male Room/Bed: 016C/016C  Code Status   Code Status: Full Code  Home/SNF/Other Home Patient oriented to: self, place, time, and situation Is this baseline? Yes   Triage Complete: Triage complete  Chief Complaint TIA (transient ischemic attack) [G45.9]  Triage Note PT arrives via POV from home, reports at 8:30 this morning woke up and couldn't see out of his right eye, lasting approx 2 minutes. Pt reports numbness in bilateral upper extremities, worse in left arm. No droop, drift, slurred speech noted by patient at the time. Pt reports symptoms have resolved. LSN 2:30am when woke up to use the bathroom. Pt states has no symptoms at present.    Allergies No Known Allergies  Level of Care/Admitting Diagnosis ED Disposition     ED Disposition  Admit   Condition  --   Comment  Hospital Area: [facility] [[id number]]  Level of Care: Telemetry Medical [104]  May place patient in observation at [facility] or [facility] if equivalent level of care is available:: No  Covid Evaluation: Asymptomatic - no recent exposure (last 10 days) testing not required  Diagnosis: TIA (transient ischemic attack) [454098]  Admitting Physician: Zannie Cove [[id number]]  Attending Physician: Zannie Cove [[id number]]          B Medical/Surgery History Past Medical History:  Diagnosis Date   Arthritis    knees,back   CAD (coronary artery disease) CARDIOLOGIST-  DR HOCHREIN   previous myocardial infarction in 2001 with 90% circumflex lesion treated with PTCA. The LAD had 70-80% stenosis which was treawted with angioplasty. Most recent catheterization in 2003 demonstrated the LAD at 30% mid stenosis, circumflex 60-70% stenosis in the mid AV grove, and 40% stenosis in the previously stented area. The right coronary artery had 30-40% stenosis in the mid portion.     GERD (gastroesophageal reflux disease)    History of kidney stones    Hypertension    Hypothyroidism    Left ureteral stone    Pre-diabetes    S/P mitral valve clip implantation 03/25/2022   s/p TEER wtih two XTW MitraClips by Dr. Excell Seltzer and Lynnette Caffey   Severe mitral regurgitation    Skin cancer    face   Past Surgical History:  Procedure Laterality Date   APPENDECTOMY  as teen   BUBBLE STUDY  12/11/2021   Procedure: BUBBLE STUDY;  Surgeon: Parke Poisson, MD;  Location: [facility] ENDOSCOPY;  Service: Cardiology;;   CARDIAC CATHETERIZATION  03-29-2000   dr hochrein   Nonobstuctive CAD /   CFX patent stent and LAD diffuse luminal irregularities including mid segment previously been angioplastied   CARDIOVASCULAR STRESS TEST  05-09-2015   dr hochrein   low risk perfusion study/  no reversible ischemia/  normal LV function and wall motion , ef 63%   CARDIOVERSION N/A 04/16/2022   Procedure: CARDIOVERSION;  Surgeon: Sande Rives, MD;  Location: [facility] ENDOSCOPY;  Service: Cardiovascular;  Laterality: N/A;   CATARACT EXTRACTION W/ INTRAOCULAR LENS  IMPLANT, BILATERAL  left 05-06-2015/  right 06-10-2015   CORONARY ANGIOPLASTY WITH STENT PLACEMENT  01-21-2000  dr hochrein   PTCA to pLAD/  PTCA and BMS x1 to mid LCFX/  normal LVSF   CORONARY ANGIOPLASTY WITH STENT PLACEMENT  01-10-2002  dr Chales Abrahams   PTCA and BMS to mid AV LCFX (in-stent restenosis)/  diffuse 30-40% mRCA ,  mLAD diffuse 30%,  normal LVSF, ef 65%   CYSTOSCOPY/RETROGRADE/URETEROSCOPY/STONE EXTRACTION WITH BASKET Left 05/23/2015   Procedure: CYSTOSCOPY, FULGERATION OF PROSTATE,  LEFT URETEROSCOPY, JJ STENT PLACEMENT;  Surgeon: Jerilee Field, MD;  [facility];  Service: Urology;  Laterality: Left;   ELECTROPHYSIOLOGIC STUDY  03-30-2000   dr Graciela Husbands   EXTRACORPOREAL SHOCK WAVE LITHOTRIPSY Left 10-30-2012   EXTRACORPOREAL SHOCK WAVE LITHOTRIPSY Right 11/16/2021   Procedure: EXTRACORPOREAL SHOCK WAVE  LITHOTRIPSY (ESWL);  Surgeon: Marcine Matar, MD;  Location: [facility];  Service: Urology;  Laterality: Right;   HOLMIUM LASER APPLICATION Left 05/23/2015   Procedure: WITH HOLMIUM LASER LITHOTRIPSY;  Surgeon: Jerilee Field, MD;  Location: [facility];  Service: Urology;  Laterality: Left;   KNEE ARTHROSCOPY Bilateral x2 right/  left 2015   LUMBAR LAMINECTOMY/DECOMPRESSION MICRODISCECTOMY N/A 09/28/2012   Procedure: L3  -L5 DECOMPRESSION L4 - L5 FUSION WITH INSTRUMENTATION 2 LEVELS;  Surgeon: Venita Lick, MD;  Location: [facility] OR;  Service: Orthopedics;  Laterality: N/A;   MITRAL VALVE REPAIR N/A 03/25/2022   Procedure: MITRAL VALVE REPAIR;  Surgeon: Orbie Pyo, MD;  Location: [facility] INVASIVE CV LAB;  Service: Cardiovascular;  Laterality: N/A;   RIGHT/LEFT HEART CATH AND CORONARY ANGIOGRAPHY N/A 01/27/2022   Procedure: RIGHT/LEFT HEART CATH AND CORONARY ANGIOGRAPHY;  Surgeon: Orbie Pyo, MD;  Location: [facility] INVASIVE CV LAB;  Service: Cardiovascular;  Laterality: N/A;   TEE WITHOUT CARDIOVERSION N/A 12/11/2021   Procedure: TRANSESOPHAGEAL ECHOCARDIOGRAM (TEE);  Surgeon: Parke Poisson, MD;  Location: [facility] ENDOSCOPY;  Service: Cardiology;  Laterality: N/A;   TEE WITHOUT CARDIOVERSION N/A 03/25/2022   Procedure: TRANSESOPHAGEAL ECHOCARDIOGRAM (TEE);  Surgeon: Orbie Pyo, MD;  Location: [facility] INVASIVE CV LAB;  Service: Cardiovascular;  Laterality: N/A;   TONSILLECTOMY  as child   TOTAL KNEE ARTHROPLASTY Left 10/02/2018   Procedure: TOTAL LEFT  KNEE ARTHROPLASTY;  Surgeon: Durene Romans, MD;  Location: [facility] ORS;  Service: Orthopedics;  Laterality: Left;  90 mins- Ok per Tiffany   TOTAL SHOULDER ARTHROPLASTY Right 01/14/2011   TRANSTHORACIC ECHOCARDIOGRAM  05-21-2015   mild LVH,  ef 55-605,  grade 2 diastolic dysfunction, basal inferior hyperkinesis/  moderate AV calcification sclerosis without stenosis/  trivial MR/  moderate LAE/  mild TR/  mild RAE     A IV  Location/Drains/Wounds Patient Lines/Drains/Airways Status     Active Line/Drains/Airways     Name Placement date Placement time Site Days   Peripheral IV 04/29/23 18 G Anterior;Distal;Left;Upper Arm 04/29/23  1424  Arm  less than 1   Ureteral Drain/Stent Left ureter 6 Fr. 05/23/15  0936  Left ureter  2898            Intake/Output Last 24 hours No intake or output data in the 24 hours ending 04/29/23 1811  Labs/Imaging Results for orders placed or performed during the hospital encounter of 04/29/23 (from the past 48 hour(s))  Protime-INR     Status: None   Collection Time: 04/29/23  2:27 PM  Result Value Ref Range   Prothrombin Time 14.7 11.4 - 15.2 seconds   INR 1.1 0.8 - 1.2    Comment: (NOTE) INR goal varies based on device and disease states. Performed at [facility], [address] [place], [place] [address]   APTT     Status: None   Collection Time: 04/29/23  2:27 PM  Result Value Ref Range   aPTT 31 24 - 36 seconds    Comment: Performed  at [facility], [address], [place], [place] [address]  CBC     Status: Abnormal   Collection Time: 04/29/23  2:27 PM  Result Value Ref Range   WBC 6.3 4.0 - 10.5 K/uL   RBC 4.53 4.22 - 5.81 MIL/uL   Hemoglobin 14.4 13.0 - 17.0 g/dL   HCT 40.9 81.1 - 91.4 %   MCV 99.8 80.0 - 100.0 fL   MCH 31.8 26.0 - 34.0 pg   MCHC 31.9 30.0 - 36.0 g/dL   RDW 78.2 95.6 - 21.3 %   Platelets 141 (L) 150 - 400 K/uL   nRBC 0.0 0.0 - 0.2 %    Comment: Performed at [facility], [address] [place], [place] [address]  Differential     Status: None   Collection Time: 04/29/23  2:27 PM  Result Value Ref Range   Neutrophils Relative % 67 %   Neutro Abs 4.3 1.7 - 7.7 K/uL   Lymphocytes Relative 20 %   Lymphs Abs 1.3 0.7 - 4.0 K/uL   Monocytes Relative 9 %   Monocytes Absolute 0.5 0.1 - 1.0 K/uL   Eosinophils Relative 2 %   Eosinophils Absolute 0.1 0.0 - 0.5 K/uL   Basophils Relative 1 %   Basophils Absolute 0.0  0.0 - 0.1 K/uL   Immature Granulocytes 1 %   Abs Immature Granulocytes 0.03 0.00 - 0.07 K/uL    Comment: Performed at [facility], [address], [place], [place] [address]  Comprehensive metabolic panel     Status: Abnormal   Collection Time: 04/29/23  2:27 PM  Result Value Ref Range   Sodium 137 135 - 145 mmol/L   Potassium 4.4 3.5 - 5.1 mmol/L   Chloride 102 98 - 111 mmol/L   CO2 27 22 - 32 mmol/L   Glucose, Bld 115 (H) 70 - 99 mg/dL    Comment: Glucose reference range applies only to samples taken after fasting for at least 8 hours.   BUN 26 (H) 8 - 23 mg/dL   Creatinine, Ser 2.95 (H) 0.61 - 1.24 mg/dL   Calcium 9.0 8.9 - 28.4 mg/dL   Total Protein 6.5 6.5 - 8.1 g/dL   Albumin 3.6 3.5 - 5.0 g/dL   AST 24 15 - 41 U/L   ALT 22 0 - 44 U/L   Alkaline Phosphatase 56 38 - 126 U/L   Total Bilirubin 0.9 0.3 - 1.2 mg/dL   GFR, Estimated 34 (L) >60 mL/min    Comment: (NOTE) Calculated using the CKD-EPI Creatinine Equation (2021)    Anion gap 8 5 - 15    Comment: Performed at [facility], [address], [place], [place] [address]  Ethanol     Status: None   Collection Time: 04/29/23  2:27 PM  Result Value Ref Range   Alcohol, Ethyl (B) <10 <10 mg/dL    Comment: (NOTE) Lowest detectable limit for serum alcohol is 10 mg/dL.  For medical purposes only. Performed at [facility], [address] [place], [place] [address]   I-stat chem 8, ED     Status: Abnormal   Collection Time: 04/29/23  2:32 PM  Result Value Ref Range   Sodium 140 135 - 145 mmol/L   Potassium 4.5 3.5 - 5.1 mmol/L   Chloride 103 98 - 111 mmol/L   BUN 28 (H) 8 - 23 mg/dL   Creatinine, Ser 2.53 (H) 0.61 - 1.24 mg/dL  Glucose, Bld 109 (H) 70 - 99 mg/dL    Comment: Glucose reference range applies only to samples taken after fasting for at least 8 hours.   Calcium, Ion 1.19 1.15 - 1.40 mmol/L   TCO2 24 22 - 32 mmol/L   Hemoglobin 15.0 13.0 - 17.0 g/dL   HCT 45.4 09.8 - 11.9 %   CT ANGIO  HEAD NECK W WO CM  Result Date: 04/29/2023 CLINICAL DATA:  Neuro deficit, persistent/recurrent, CNS neoplasm suspected. EXAM: CT ANGIOGRAPHY HEAD AND NECK WITH AND WITHOUT CONTRAST TECHNIQUE: Multidetector CT imaging of the head and neck was performed using the standard protocol during bolus administration of intravenous contrast. Multiplanar CT image reconstructions and MIPs were obtained to evaluate the vascular anatomy. Carotid stenosis measurements (when applicable) are obtained utilizing NASCET criteria, using the distal internal carotid diameter as the denominator. RADIATION DOSE REDUCTION: This exam was performed according to the departmental dose-optimization program which includes automated exposure control, adjustment of the mA and/or kV according to patient size and/or use of iterative reconstruction technique. CONTRAST:  70mL OMNIPAQUE IOHEXOL 350 MG/ML SOLN COMPARISON:  None Available. FINDINGS: CT HEAD FINDINGS Brain: No acute hemorrhage. Age-indeterminate infarct in the right caudate nucleus (axial image 17 series 3). Background of mild chronic small-vessel disease. No hydrocephalus or extra-axial collection. No mass effect or midline shift. Vascular: Punctate calcification along a vessel in the right sylvian fissure (axial image 16 series 3). Skull: No calvarial fracture or suspicious bone lesion. Skull base is unremarkable. Sinuses/Orbits: No acute finding. Other: None. Review of the MIP images confirms the above findings CTA NECK FINDINGS Aortic arch: Three-vessel arch configuration. Arch vessel origins are patent. Right carotid system: Densely calcified plaque results in proximally 60% stenosis of the proximal right cervical ICA. Left carotid system: No evidence of dissection, stenosis (50% or greater), or occlusion. Moderate calcified plaque along the left carotid bulb and proximal left cervical ICA. Vertebral arteries: Left dominant. No evidence of dissection, stenosis (50% or greater), or  occlusion. Skeleton: Mild cervical spondylosis without high-grade spinal canal stenosis. Other neck: Unremarkable. Upper chest: Unremarkable. Review of the MIP images confirms the above findings CTA HEAD FINDINGS Anterior circulation: Intracranial ICAs are patent without stenosis or aneurysm. The proximal ACAs and MCAs are patent without stenosis or aneurysm. Nonocclusive calcified thrombus along an anterior M4 Almarosa Bohac of the right MCA within the right sylvian fissure (axial image 76 series 9). Distal branches are symmetric. Posterior circulation: Normal basilar artery. The SCAs, AICAs and PICAs are patent proximally. The PCAs are patent proximally without stenosis or aneurysm. Distal branches are symmetric. Venous sinuses: As permitted by contrast timing, patent. Anatomic variants: None. Review of the MIP images confirms the above findings IMPRESSION: 1. Age-indeterminate infarct in the right caudate nucleus. 2. Nonocclusive calcified thrombus along an anterior M4 Jeniel Slauson of the right MCA within the right Sylvian fissure. 3. Densely calcified plaque results in approximately 60% stenosis of the proximal right cervical ICA. 4. No other hemodynamically significant stenosis or large vessel occlusion in the head or neck. Electronically Signed   By: Orvan Falconer M.D.   On: 04/29/2023 16:57    Pending Labs [employer] (From admission, onward)     Start     Ordered   Signed and Held  Lipid panel  (Labs)  Tomorrow morning,   R       Comments: Fasting    Signed and Held   Signed and Held  Hemoglobin A1c  (Labs)  Once,   R  Comments: To assess prior glycemic control    Signed and Held            Vitals/Pain Today's Vitals   04/29/23 1707 04/29/23 1710 04/29/23 1730 04/29/23 1756  BP:  (!) 170/73 (!) 172/78   Pulse: (!) 59 (!) 59 61   Resp: 13 18 13    Temp:    97.6 F (36.4 C)  SpO2: 99% 99% 100%   Weight:      Height:      PainSc:        Isolation Precautions No active  isolations  Medications Medications  sodium chloride flush (NS) 0.9 % injection 3 mL (has no administration in time range)  iohexol (OMNIPAQUE) 350 MG/ML injection 70 mL (70 mLs Intravenous Contrast Given 04/29/23 1514)    Mobility walks     Focused Assessments Neuro Assessment Handoff:  Swallow screen pass? Yes    NIH Stroke Scale  Interval: Initial Level of Consciousness (1a.)   : Alert, keenly responsive LOC Questions (1b. )   : Answers both questions correctly LOC Commands (1c. )   : Performs both tasks correctly Best Gaze (2. )  : Normal Visual (3. )  : No visual loss Facial Palsy (4. )    : Normal symmetrical movements Motor Arm, Left (5a. )   : No drift Motor Arm, Right (5b. ) : No drift Motor Leg, Left (6a. )  : No drift Motor Leg, Right (6b. ) : No drift Limb Ataxia (7. ): Absent Sensory (8. )  : Normal, no sensory loss Best Language (9. )  : No aphasia Dysarthria (10. ): Normal Extinction/Inattention (11.)   : No Abnormality Complete NIHSS TOTAL: 0     Neuro Assessment:   Neuro Checks:   Initial (04/29/23 1700)  Has TPA been given? No If patient is a Neuro Trauma and patient is going to OR before floor call report to 4N Charge nurse: [phone] or [phone]   R Recommendations: See Admitting Provider Note  Report given to:   Additional Notes:

## 2023-04-29 NOTE — ED Triage Notes (Signed)
PT arrives via POV from home, reports at 8:30 this morning woke up and couldn't see out of his right eye, lasting approx 2 minutes. Pt reports numbness in bilateral upper extremities, worse in left arm. No droop, drift, slurred speech noted by patient at the time. Pt reports symptoms have resolved. LSN 2:30am when woke up to use the bathroom. Pt states has no symptoms at present.

## 2023-04-29 NOTE — H&P (Signed)
History and Physical    [name]:[id number] DOB: [birth year] DOA: 04/29/2023  Referring MD/NP/PA: EDP PCP:  Patient coming from: Home  Chief Complaint: Transient vision loss and arm numbness  HPI: [name] is a 87/M with history of CAD, hypertension, dyslipidemia, chronic A-fib on apixaban, CKD 3B, history of severe mitral regurgitation sp mTEER on 03/25/2022 presented to the ED with above symptoms..  Patient reports having issues with balance for the last few days, this morning when he woke up he had sudden vision loss in his right eye which slowly normalized in a few minutes, thereafter during the day he noticed numbness in both his arms, subsequently presented to the ED, arm symptoms are improving ED Course: Vital signs stable, labs noted sodium 137, glucose 115, creatinine 1.9, hemoglobin 14, platelets 141, EKG noted sinus rhythm, CTA head notes aged indeterminate infarct in right caudate nucleus and nonocclusive calcified thrombus along anterior M4 branch of right MCA  Review of Systems: As per HPI otherwise 14 point review of systems negative.   Past Medical History:  Diagnosis Date   Arthritis    knees,back   CAD (coronary artery disease) CARDIOLOGIST-  DR HOCHREIN   previous myocardial infarction in 2001 with 90% circumflex lesion treated with PTCA. The LAD had 70-80% stenosis which was treawted with angioplasty. Most recent catheterization in 2003 demonstrated the LAD at 30% mid stenosis, circumflex 60-70% stenosis in the mid AV grove, and 40% stenosis in the previously stented area. The right coronary artery had 30-40% stenosis in the mid portion.    GERD (gastroesophageal reflux disease)    History of kidney stones    Hypertension    Hypothyroidism    Left ureteral stone    Pre-diabetes    S/P mitral valve clip implantation 03/25/2022   s/p TEER wtih two XTW MitraClips by Dr. Excell Seltzer and Lynnette Caffey   Severe mitral regurgitation    Skin cancer    face    Past  Surgical History:  Procedure Laterality Date   APPENDECTOMY  as teen   BUBBLE STUDY  12/11/2021   Procedure: BUBBLE STUDY;  Surgeon: Parke Poisson, MD;  Location: [facility] ENDOSCOPY;  Service: Cardiology;;   CARDIAC CATHETERIZATION  03-29-2000   dr hochrein   Nonobstuctive CAD /   CFX patent stent and LAD diffuse luminal irregularities including mid segment previously been angioplastied   CARDIOVASCULAR STRESS TEST  05-09-2015   dr hochrein   low risk perfusion study/  no reversible ischemia/  normal LV function and wall motion , ef 63%   CARDIOVERSION N/A 04/16/2022   Procedure: CARDIOVERSION;  Surgeon: Sande Rives, MD;  Location: [facility] ENDOSCOPY;  Service: Cardiovascular;  Laterality: N/A;   CATARACT EXTRACTION W/ INTRAOCULAR LENS  IMPLANT, BILATERAL  left 05-06-2015/  right 06-10-2015   CORONARY ANGIOPLASTY WITH STENT PLACEMENT  01-21-2000  dr hochrein   PTCA to pLAD/  PTCA and BMS x1 to mid LCFX/  normal LVSF   CORONARY ANGIOPLASTY WITH STENT PLACEMENT  01-10-2002  dr Chales Abrahams   PTCA and BMS to mid AV LCFX (in-stent restenosis)/  diffuse 30-40% mRCA ,  mLAD diffuse 30%,  normal LVSF, ef 65%   CYSTOSCOPY/RETROGRADE/URETEROSCOPY/STONE EXTRACTION WITH BASKET Left 05/23/2015   Procedure: CYSTOSCOPY, FULGERATION OF PROSTATE,  LEFT URETEROSCOPY, JJ STENT PLACEMENT;  Surgeon: Jerilee Field, MD;  Location: [facility];  Service: Urology;  Laterality: Left;   ELECTROPHYSIOLOGIC STUDY  03-30-2000   dr Graciela Husbands   EXTRACORPOREAL SHOCK WAVE LITHOTRIPSY Left 10-30-2012  EXTRACORPOREAL SHOCK WAVE LITHOTRIPSY Right 11/16/2021   Procedure: EXTRACORPOREAL SHOCK WAVE LITHOTRIPSY (ESWL);  Surgeon: Marcine Matar, MD;  Location: [facility];  Service: Urology;  Laterality: Right;   HOLMIUM LASER APPLICATION Left 05/23/2015   Procedure: WITH HOLMIUM LASER LITHOTRIPSY;  Surgeon: Jerilee Field, MD;  Location: [facility];  Service: Urology;  Laterality: Left;    KNEE ARTHROSCOPY Bilateral x2 right/  left 2015   LUMBAR LAMINECTOMY/DECOMPRESSION MICRODISCECTOMY N/A 09/28/2012   Procedure: L3  -L5 DECOMPRESSION L4 - L5 FUSION WITH INSTRUMENTATION 2 LEVELS;  Surgeon: Venita Lick, MD;  Location: [facility] OR;  Service: Orthopedics;  Laterality: N/A;   MITRAL VALVE REPAIR N/A 03/25/2022   Procedure: MITRAL VALVE REPAIR;  Surgeon: Orbie Pyo, MD;  Location: [facility] INVASIVE CV LAB;  Service: Cardiovascular;  Laterality: N/A;   RIGHT/LEFT HEART CATH AND CORONARY ANGIOGRAPHY N/A 01/27/2022   Procedure: RIGHT/LEFT HEART CATH AND CORONARY ANGIOGRAPHY;  Surgeon: Orbie Pyo, MD;  Location: [facility] INVASIVE CV LAB;  Service: Cardiovascular;  Laterality: N/A;   TEE WITHOUT CARDIOVERSION N/A 12/11/2021   Procedure: TRANSESOPHAGEAL ECHOCARDIOGRAM (TEE);  Surgeon: Parke Poisson, MD;  Location: [facility] ENDOSCOPY;  Service: Cardiology;  Laterality: N/A;   TEE WITHOUT CARDIOVERSION N/A 03/25/2022   Procedure: TRANSESOPHAGEAL ECHOCARDIOGRAM (TEE);  Surgeon: Orbie Pyo, MD;  Location: [facility] INVASIVE CV LAB;  Service: Cardiovascular;  Laterality: N/A;   TONSILLECTOMY  as child   TOTAL KNEE ARTHROPLASTY Left 10/02/2018   Procedure: TOTAL LEFT  KNEE ARTHROPLASTY;  Surgeon: Durene Romans, MD;  Location: [facility] ORS;  Service: Orthopedics;  Laterality: Left;  90 mins- Ok per Tiffany   TOTAL SHOULDER ARTHROPLASTY Right 01/14/2011   TRANSTHORACIC ECHOCARDIOGRAM  05-21-2015   mild LVH,  ef 55-605,  grade 2 diastolic dysfunction, basal inferior hyperkinesis/  moderate AV calcification sclerosis without stenosis/  trivial MR/  moderate LAE/  mild TR/  mild RAE     reports that he quit smoking about 60 years ago. His smoking use included cigarettes. He started smoking about 63 years ago. He has a 3 pack-year smoking history. He has never used smokeless tobacco. He reports that he does not drink alcohol and does not use drugs.  No Known Allergies  Family History  Problem Relation Age of Onset    Heart failure Mother    Prostate cancer Father      Prior to Admission medications   Medication Sig Start Date End Date Taking? Authorizing Provider  acetaminophen (TYLENOL) 500 MG tablet Take 500 mg by mouth every 6 (six) hours as needed for moderate pain.    [provider]  amiodarone (PACERONE) 200 MG tablet Take 1 tablet (200 mg total) by mouth daily. 12/15/22   Laurey Morale, MD  amLODipine (NORVASC) 2.5 MG tablet Take 1 tablet (2.5 mg total) by mouth daily. 04/01/23 06/30/23  Jacklynn Ganong, FNP  amoxicillin (AMOXIL) 500 MG capsule Take 4 capsules (2,000 mg total) by mouth as directed. Take 4 tablets 1 hour prior to dental work, including cleanings. 03/31/22   Filbert Schilder, NP  apixaban Everlene Balls) 5 MG TABS tablet Take 1/2 tablet by mouth twice daily 11/16/22   Rollene Rotunda, MD  atorvastatin (LIPITOR) 80 MG tablet Take 1 tablet (80 mg total) by mouth at bedtime. 11/26/13   Rollene Rotunda, MD  Cholecalciferol (VITAMIN D) 50 MCG (2000 UT) tablet Take 2,000 Units by mouth daily.    [provider]  Cyanocobalamin (B-12) 5000 MCG CAPS Take 5,000 mcg by  mouth daily.    [provider]  dapagliflozin propanediol (FARXIGA) 10 MG TABS tablet Take 1 tablet (10 mg total) by mouth daily before breakfast. 05/28/22   Laurey Morale, MD  ezetimibe (ZETIA) 10 MG tablet Take 1 tablet (10 mg total) by mouth daily. 12/01/22 03/23/23  Laurey Morale, MD  ferrous sulfate 325 (65 FE) MG tablet Take 325 mg by mouth daily with breakfast.    [provider]  finasteride (PROSCAR) 5 MG tablet Take 5 mg by mouth daily.    [provider]  folic acid (FOLVITE) 800 MCG tablet Take 800 mcg by mouth daily.     [provider]  furosemide (LASIX) 20 MG tablet Take 1 tablet (20 mg total) by mouth every other day. 10/01/22   Rollene Rotunda, MD  levothyroxine (SYNTHROID) 25 MCG tablet Take 25 mcg by mouth daily before breakfast.    [provider]   niacin 500 MG tablet Take 500 mg by mouth daily.    [provider]  nitroGLYCERIN (NITROSTAT) 0.4 MG SL tablet Place 1 tablet (0.4 mg total) under the tongue every 5 (five) minutes as needed for chest pain. 04/11/23   Laurey Morale, MD  pantoprazole (PROTONIX) 40 MG tablet Take 1 tablet (40 mg total) by mouth daily. 08/20/22   Tonny Bollman, MD    Physical Exam: Vitals:   04/29/23 1707 04/29/23 1710 04/29/23 1730 04/29/23 1756  BP:  (!) 170/73 (!) 172/78   Pulse: (!) 59 (!) 59 61   Resp: 13 18 13    Temp:    97.6 F (36.4 C)  SpO2: 99% 99% 100%   Weight:      Height:        Pleasant elderly male sitting up in bed, AAOx3, no distress  HEENT:no JVD Respiratory: clear to auscultation bilaterally Cardiovascular: S1S2/RRR, click noted Abdomen: soft, non tender, Bowel sounds positive.  Musculoskeletal: No joint deformity upper and lower extremities. Ext: no edema Skin: no rashes Neurologic: CN 2-12 grossly intact. Sensation intact, DTR normal. Strength 5/5 in all 4.  Psychiatric: Normal judgment and insight. Alert and oriented x 3. Normal mood.   Labs on Admission: I have personally reviewed following labs and imaging studies  CBC: Recent Labs  Lab 04/29/23 1427 04/29/23 1432  WBC 6.3  --   NEUTROABS 4.3  --   HGB 14.4 15.0  HCT 45.2 44.0  MCV 99.8  --   PLT 141*  --    Basic Metabolic Panel: Recent Labs  Lab 04/29/23 1427 04/29/23 1432  NA 137 140  K 4.4 4.5  CL 102 103  CO2 27  --   GLUCOSE 115* 109*  BUN 26* 28*  CREATININE 1.91* 1.90*  CALCIUM 9.0  --    GFR: Estimated Creatinine Clearance: 28.4 mL/min (A) (by C-G formula based on SCr of 1.9 mg/dL (H)). Liver Function Tests: Recent Labs  Lab 04/29/23 1427  AST 24  ALT 22  ALKPHOS 56  BILITOT 0.9  PROT 6.5  ALBUMIN 3.6   No results for input(s): "LIPASE", "AMYLASE" in the last 168 hours. No results for input(s): "AMMONIA" in the last 168 hours. Coagulation Profile: Recent Labs  Lab  04/29/23 1427  INR 1.1   Cardiac Enzymes: No results for input(s): "CKTOTAL", "CKMB", "CKMBINDEX", "TROPONINI" in the last 168 hours. BNP (last 3 results) No results for input(s): "PROBNP" in the last 8760 hours. HbA1C: No results for input(s): "HGBA1C" in the last 72 hours. CBG: No results  for input(s): "GLUCAP" in the last 168 hours. Lipid Profile: No results for input(s): "CHOL", "HDL", "LDLCALC", "TRIG", "CHOLHDL", "LDLDIRECT" in the last 72 hours. Thyroid Function Tests: No results for input(s): "TSH", "T4TOTAL", "FREET4", "T3FREE", "THYROIDAB" in the last 72 hours. Anemia Panel: No results for input(s): "VITAMINB12", "FOLATE", "FERRITIN", "TIBC", "IRON", "RETICCTPCT" in the last 72 hours. Urine analysis:    Component Value Date/Time   COLORURINE YELLOW 03/23/2022 1329   APPEARANCEUR CLEAR 03/23/2022 1329   LABSPEC 1.013 03/23/2022 1329   PHURINE 6.0 03/23/2022 1329   GLUCOSEU >=500 (A) 03/23/2022 1329   HGBUR SMALL (A) 03/23/2022 1329   BILIRUBINUR NEGATIVE 03/23/2022 1329   KETONESUR NEGATIVE 03/23/2022 1329   PROTEINUR NEGATIVE 03/23/2022 1329   UROBILINOGEN 0.2 05/25/2015 2115   NITRITE NEGATIVE 03/23/2022 1329   LEUKOCYTESUR NEGATIVE 03/23/2022 1329   Sepsis Labs: @LABRCNTIP (procalcitonin:4,lacticidven:4) )No results found for this or any previous visit (from the past 240 hour(s)).   Radiological Exams on Admission: CT ANGIO HEAD NECK W WO CM  Result Date: 04/29/2023 CLINICAL DATA:  Neuro deficit, persistent/recurrent, CNS neoplasm suspected. EXAM: CT ANGIOGRAPHY HEAD AND NECK WITH AND WITHOUT CONTRAST TECHNIQUE: Multidetector CT imaging of the head and neck was performed using the standard protocol during bolus administration of intravenous contrast. Multiplanar CT image reconstructions and MIPs were obtained to evaluate the vascular anatomy. Carotid stenosis measurements (when applicable) are obtained utilizing NASCET criteria, using the distal internal carotid  diameter as the denominator. RADIATION DOSE REDUCTION: This exam was performed according to the departmental dose-optimization program which includes automated exposure control, adjustment of the mA and/or kV according to patient size and/or use of iterative reconstruction technique. CONTRAST:  70mL OMNIPAQUE IOHEXOL 350 MG/ML SOLN COMPARISON:  None Available. FINDINGS: CT HEAD FINDINGS Brain: No acute hemorrhage. Age-indeterminate infarct in the right caudate nucleus (axial image 17 series 3). Background of mild chronic small-vessel disease. No hydrocephalus or extra-axial collection. No mass effect or midline shift. Vascular: Punctate calcification along a vessel in the right sylvian fissure (axial image 16 series 3). Skull: No calvarial fracture or suspicious bone lesion. Skull base is unremarkable. Sinuses/Orbits: No acute finding. Other: None. Review of the MIP images confirms the above findings CTA NECK FINDINGS Aortic arch: Three-vessel arch configuration. Arch vessel origins are patent. Right carotid system: Densely calcified plaque results in proximally 60% stenosis of the proximal right cervical ICA. Left carotid system: No evidence of dissection, stenosis (50% or greater), or occlusion. Moderate calcified plaque along the left carotid bulb and proximal left cervical ICA. Vertebral arteries: Left dominant. No evidence of dissection, stenosis (50% or greater), or occlusion. Skeleton: Mild cervical spondylosis without high-grade spinal canal stenosis. Other neck: Unremarkable. Upper chest: Unremarkable. Review of the MIP images confirms the above findings CTA HEAD FINDINGS Anterior circulation: Intracranial ICAs are patent without stenosis or aneurysm. The proximal ACAs and MCAs are patent without stenosis or aneurysm. Nonocclusive calcified thrombus along an anterior M4 branch of the right MCA within the right sylvian fissure (axial image 76 series 9). Distal branches are symmetric. Posterior circulation:  Normal basilar artery. The SCAs, AICAs and PICAs are patent proximally. The PCAs are patent proximally without stenosis or aneurysm. Distal branches are symmetric. Venous sinuses: As permitted by contrast timing, patent. Anatomic variants: None. Review of the MIP images confirms the above findings IMPRESSION: 1. Age-indeterminate infarct in the right caudate nucleus. 2. Nonocclusive calcified thrombus along an anterior M4 branch of the right MCA within the right Sylvian fissure. 3. Densely calcified plaque results in  approximately 60% stenosis of the proximal right cervical ICA. 4. No other hemodynamically significant stenosis or large vessel occlusion in the head or neck. Electronically Signed   By: Orvan Falconer M.D.   On: 04/29/2023 16:57    EKG:   Assessment/Plan  Transient right eye vision loss Bilateral arm numbness -CTA head notes aged indeterminate infarct in right caudate nucleus and nonocclusive calcified thrombus along anterior M4 branch of right MCA -Will admit for stroke workup -Check MRI brain, carotid duplex,  -recent echo last month with preserved EF, mitral clip-will not repeat -Eliquis resumed -Follow-up HbA1c and LDL -Neurology consulted by EDP  Chronic diastolic CHF   Essential hypertension -Clinically euvolemic, hold Lasix and amlodipine -Continue Farxiga  Chronic atrial fibrillation (HCC) -Continue amiodarone and Eliquis  CKD 3b -Stable, monitor    CAD (coronary artery disease) -Stable, no ACS, continue statin, Eliquis    S/P mitral valve clip implantation 8/23  DVT prophylaxis:  Apixaban Code Status: Full Code Family Communication: daughter at bedside Disposition Plan: Home Consults called: Neurology consulted by EDP, will confirm Admission status: Observation  Zannie Cove MD [facility]   04/29/2023, 6:01 PM

## 2023-04-29 NOTE — ED Notes (Signed)
Patient transported to CT 

## 2023-04-29 NOTE — ED Provider Notes (Signed)
[facility] Provider Note   CSN: [id number] Arrival date & time: 04/29/23  1349     History  Chief Complaint  Patient presents with   Loss of Vision    [name] is a 87 y.o. male.  HPI 87 year old male with past medical history of hypertension, hyperlipidemia, CAD, atrial fibrillation on Eliquis, CKD, mitral regurgitation status post mitral clipping presenting for evaluation of vision loss.  Patient states that he woke this morning at 8:30 AM and noticed that his right eye had loss of vision.  He describes being able to see shadows and light, but cannot make out any objects.  Encompass the entire field of vision on the right.  No associated pain.  Denies any preceding descending curtain, twinkling lights, or other aura.  Reports that this vision loss lasted approximately 2 minutes before resolving.  Patient went back to sleep, but several hours later he noticed a squeezing feeling in his arms accompanied by bilateral fingertip numbness.  Numbness has persisted for several hours, but has decreased in intensity.  Patient denies any recent fever, cough, congestion, chest pain, shortness of breath, palpitation, abdominal pain, pain with urination, urinary frequency.  Has never had anything like this before.  In addition, patient states that for the last several months he has had intermittent spells of dizziness.  This is often triggered by rapid movement such as bending over.  Symptoms only last a few minutes before resolving.    Home Medications Prior to Admission medications   Medication Sig Start Date End Date Taking? Authorizing Provider  acetaminophen (TYLENOL) 500 MG tablet Take 500 mg by mouth every 6 (six) hours as needed for moderate pain.   Yes [provider]  amiodarone (PACERONE) 200 MG tablet Take 1 tablet (200 mg total) by mouth daily. 12/15/22  Yes Laurey Morale, MD  amLODipine (NORVASC) 2.5 MG tablet Take 1 tablet  (2.5 mg total) by mouth daily. 04/01/23 06/30/23 Yes [place], Anderson Malta, FNP  apixaban (ELIQUIS) 5 MG TABS tablet Take 1/2 tablet by mouth twice daily 11/16/22  Yes Rollene Rotunda, MD  atorvastatin (LIPITOR) 80 MG tablet Take 1 tablet (80 mg total) by mouth at bedtime. 11/26/13  Yes Rollene Rotunda, MD  Cholecalciferol (VITAMIN D) 50 MCG (2000 UT) tablet Take 2,000 Units by mouth daily.   Yes [provider]  Cyanocobalamin (B-12) 5000 MCG CAPS Take 5,000 mcg by mouth daily.   Yes [provider]  dapagliflozin propanediol (FARXIGA) 10 MG TABS tablet Take 1 tablet (10 mg total) by mouth daily before breakfast. 05/28/22  Yes Laurey Morale, MD  ferrous sulfate 325 (65 FE) MG tablet Take 325 mg by mouth daily with breakfast.   Yes [provider]  finasteride (PROSCAR) 5 MG tablet Take 5 mg by mouth daily.   Yes [provider]  folic acid (FOLVITE) 800 MCG tablet Take 800 mcg by mouth daily.    Yes [provider]  furosemide (LASIX) 20 MG tablet Take 1 tablet (20 mg total) by mouth every other day. 10/01/22  Yes Rollene Rotunda, MD  levothyroxine (SYNTHROID) 25 MCG tablet Take 50 mcg by mouth daily before breakfast.   Yes [provider]  meclizine (ANTIVERT) 25 MG tablet Take 25 mg by mouth daily as needed for dizziness.   Yes [provider]  niacin 500 MG tablet Take 500 mg by mouth daily.   Yes [provider]  nitroGLYCERIN (NITROSTAT) 0.4 MG  SL tablet Place 1 tablet (0.4 mg total) under the tongue every 5 (five) minutes as needed for chest pain. 04/11/23  Yes Laurey Morale, MD  pantoprazole (PROTONIX) 40 MG tablet Take 1 tablet (40 mg total) by mouth daily. 08/20/22  Yes Tonny Bollman, MD      Allergies    Patient has no known allergies.    Review of Systems   Review of Systems  Physical Exam Updated Vital Signs BP (!) 172/78   Pulse 61   Temp 97.6 F (36.4 C)   Resp 13   Ht 5\' 9"  (1.753 m)   Wt 77.1 kg    SpO2 100%   BMI 25.10 kg/m  Physical Exam Constitutional:      General: He is not in acute distress.    Appearance: He is not ill-appearing.  HENT:     Head: Normocephalic and atraumatic.     Right Ear: External ear normal.     Left Ear: External ear normal.     Nose: Nose normal.     Mouth/Throat:     Mouth: Mucous membranes are moist.     Pharynx: Oropharynx is clear. No oropharyngeal exudate.  Eyes:     General:        Right eye: No discharge.        Left eye: No discharge.     Extraocular Movements: Extraocular movements intact.     Conjunctiva/sclera: Conjunctivae normal.     Pupils: Pupils are equal, round, and reactive to light.  Cardiovascular:     Rate and Rhythm: Normal rate.     Pulses: Normal pulses.     Heart sounds: Murmur heard.     Comments: Systolic murmur Pulmonary:     Effort: Pulmonary effort is normal. No respiratory distress.     Breath sounds: No wheezing.  Abdominal:     General: Abdomen is flat.     Palpations: Abdomen is soft.     Tenderness: There is no abdominal tenderness. There is no guarding or rebound.  Musculoskeletal:        General: Normal range of motion.     Cervical back: Normal range of motion. No tenderness.     Right lower leg: No edema.     Left lower leg: No edema.  Skin:    General: Skin is warm and dry.     Findings: No rash.  Neurological:     General: No focal deficit present.     Mental Status: He is alert.     Cranial Nerves: No cranial nerve deficit.     Sensory: No sensory deficit.     Motor: No weakness.     Comments: Slow, pill-rolling tremor of right upper extremity     ED Results / Procedures / Treatments   Labs (all labs ordered are listed, but only abnormal results are displayed) Labs Reviewed  CBC - Abnormal; Notable for the following components:      Result Value   Platelets 141 (*)    All other components within normal limits  COMPREHENSIVE METABOLIC PANEL - Abnormal; Notable for the following  components:   Glucose, Bld 115 (*)    BUN 26 (*)    Creatinine, Ser 1.91 (*)    GFR, Estimated 34 (*)    All other components within normal limits  I-STAT CHEM 8, ED - Abnormal; Notable for the following components:   BUN 28 (*)    Creatinine, Ser 1.90 (*)    Glucose, Bld  109 (*)    All other components within normal limits  PROTIME-INR  APTT  DIFFERENTIAL  ETHANOL    EKG None  Radiology CT ANGIO HEAD NECK W WO CM  Result Date: 04/29/2023 CLINICAL DATA:  Neuro deficit, persistent/recurrent, CNS neoplasm suspected. EXAM: CT ANGIOGRAPHY HEAD AND NECK WITH AND WITHOUT CONTRAST TECHNIQUE: Multidetector CT imaging of the head and neck was performed using the standard protocol during bolus administration of intravenous contrast. Multiplanar CT image reconstructions and MIPs were obtained to evaluate the vascular anatomy. Carotid stenosis measurements (when applicable) are obtained utilizing NASCET criteria, using the distal internal carotid diameter as the denominator. RADIATION DOSE REDUCTION: This exam was performed according to the departmental dose-optimization program which includes automated exposure control, adjustment of the mA and/or kV according to patient size and/or use of iterative reconstruction technique. CONTRAST:  70mL OMNIPAQUE IOHEXOL 350 MG/ML SOLN COMPARISON:  None Available. FINDINGS: CT HEAD FINDINGS Brain: No acute hemorrhage. Age-indeterminate infarct in the right caudate nucleus (axial image 17 series 3). Background of mild chronic small-vessel disease. No hydrocephalus or extra-axial collection. No mass effect or midline shift. Vascular: Punctate calcification along a vessel in the right sylvian fissure (axial image 16 series 3). Skull: No calvarial fracture or suspicious bone lesion. Skull base is unremarkable. Sinuses/Orbits: No acute finding. Other: None. Review of the MIP images confirms the above findings CTA NECK FINDINGS Aortic arch: Three-vessel arch configuration.  Arch vessel origins are patent. Right carotid system: Densely calcified plaque results in proximally 60% stenosis of the proximal right cervical ICA. Left carotid system: No evidence of dissection, stenosis (50% or greater), or occlusion. Moderate calcified plaque along the left carotid bulb and proximal left cervical ICA. Vertebral arteries: Left dominant. No evidence of dissection, stenosis (50% or greater), or occlusion. Skeleton: Mild cervical spondylosis without high-grade spinal canal stenosis. Other neck: Unremarkable. Upper chest: Unremarkable. Review of the MIP images confirms the above findings CTA HEAD FINDINGS Anterior circulation: Intracranial ICAs are patent without stenosis or aneurysm. The proximal ACAs and MCAs are patent without stenosis or aneurysm. Nonocclusive calcified thrombus along an anterior M4 branch of the right MCA within the right sylvian fissure (axial image 76 series 9). Distal branches are symmetric. Posterior circulation: Normal basilar artery. The SCAs, AICAs and PICAs are patent proximally. The PCAs are patent proximally without stenosis or aneurysm. Distal branches are symmetric. Venous sinuses: As permitted by contrast timing, patent. Anatomic variants: None. Review of the MIP images confirms the above findings IMPRESSION: 1. Age-indeterminate infarct in the right caudate nucleus. 2. Nonocclusive calcified thrombus along an anterior M4 branch of the right MCA within the right Sylvian fissure. 3. Densely calcified plaque results in approximately 60% stenosis of the proximal right cervical ICA. 4. No other hemodynamically significant stenosis or large vessel occlusion in the head or neck. Electronically Signed   By: Orvan Falconer M.D.   On: 04/29/2023 16:57    Procedures Procedures    Medications Ordered in ED Medications  sodium chloride flush (NS) 0.9 % injection 3 mL (has no administration in time range)  iohexol (OMNIPAQUE) 350 MG/ML injection 70 mL (70 mLs  Intravenous Contrast Given 04/29/23 1514)    ED Course/ Medical Decision Making/ A&P                                 Medical Decision Making Amount and/or Complexity of Data Reviewed Labs: ordered. Radiology: ordered.  Risk Prescription drug  management. Decision regarding hospitalization.   [name] is a 87 y.o. male who presents with painless right eye vision loss and abnormal bilateral arm numbness sensation as per above.  Last known normal approximately 02:30 this morning (13 hours ago).   I have reviewed the nursing documentation for past medical history, family history, and social history and agree.  I have reviewed the patient's vital signs. There are no significant abnormalities. Upon initial evaluation physical exam reveals a systolic murmur and mild pill-rolling tremor of the right upper extremity, but no other acute findings.    DDX considered included (but not limited to): CRAO, CRVO, ischemic optic neuropathy, cataract, vitreous hemorrhage, amaurosis fugax, TIA, cortical blindness, retinal detachment, macular degeneration, diabetic retinopathy, CMV retinitis, functional vision loss  There is definite concern for CVA or TIA given the abrupt onset of symptoms and his risk factors.  No history of trauma or evidence of laceration, volume loss of eye, pupil defects, 360 degree subconjunctival hemorrhage, or foreign body on exam to suggest open globe. No hyphema present on exam. No nausea/vomiting, blurry vision with halos around lights, photophobia, conjunctival injection, fixed/dilated pupil, or hazy cornea to suggest acute glaucoma. Additionally given painless vision loss, low suspicion for other normally painful syndromes such as corneal abrasion/ulcer, uveitis, iritis, or endopthalmitis. No history of MS, afferent pupillary defect, pain with EOM, or predominant loss of color vision to suggest optic neuritis. Presentation not concerning for retinal detachment vs vitreous  hemorrhage vs posterior vitreous detachment.    Workup and ED Course: Lab results: On metabolic panel, creatinine is 4.09, roughly consistent with his baseline over the last year.  No evidence of leukocytosis.   Imaging results: CT of the head and neck shows an age-indeterminate infarct in the right caudate nucleus.  There is also a nonocclusive calcified thrombus on the anterior M4 branch of the right MCA.  He has approximately 60% stenosis of the proximal right cervical ICA.Marland Kitchen   All radiography studies, electrocardiograms, and laboratory data were personally reviewed by me and incorporated into my medical decision making.  ED Course:  Patient re-evaluated and continued to be well-appearing. Vital signs remain reassuring. PE remains unchanged.  Discussed patient case with neurology who agreed that given his comorbidities and above CT findings, he needs to come in for evaluation.   Post-ED Care: ADMIT: I believe the patient requires admission for TIA versus new CVA. Patient will be admitted to hospitalist service. Please see in patient provider note for additional treatment plan details.      Note: Chief Executive Officer was used in the creation of this note. Grammatical errors may be present.      Final Clinical Impression(s) / ED Diagnoses Final diagnoses:  Vision disturbance    Rx / DC Orders ED Discharge Orders     None         Lyman Speller, MD 04/29/23 Thayer Headings, MD 05/02/23 1420

## 2023-04-30 ENCOUNTER — Observation Stay (HOSPITAL_COMMUNITY): Payer: Medicare HMO

## 2023-04-30 ENCOUNTER — Observation Stay (HOSPITAL_BASED_OUTPATIENT_CLINIC_OR_DEPARTMENT_OTHER): Payer: Medicare HMO

## 2023-04-30 DIAGNOSIS — I482 Chronic atrial fibrillation, unspecified: Secondary | ICD-10-CM

## 2023-04-30 DIAGNOSIS — I639 Cerebral infarction, unspecified: Secondary | ICD-10-CM | POA: Diagnosis not present

## 2023-04-30 DIAGNOSIS — H5461 Unqualified visual loss, right eye, normal vision left eye: Secondary | ICD-10-CM | POA: Diagnosis not present

## 2023-04-30 DIAGNOSIS — G453 Amaurosis fugax: Secondary | ICD-10-CM

## 2023-04-30 DIAGNOSIS — I6782 Cerebral ischemia: Secondary | ICD-10-CM | POA: Diagnosis not present

## 2023-04-30 DIAGNOSIS — I6521 Occlusion and stenosis of right carotid artery: Secondary | ICD-10-CM | POA: Diagnosis not present

## 2023-04-30 DIAGNOSIS — R9089 Other abnormal findings on diagnostic imaging of central nervous system: Secondary | ICD-10-CM | POA: Diagnosis not present

## 2023-04-30 LAB — HEMOGLOBIN A1C
Hgb A1c MFr Bld: 6.1 % — ABNORMAL HIGH (ref 4.8–5.6)
Mean Plasma Glucose: 128.37 mg/dL

## 2023-04-30 LAB — PHOSPHORUS: Phosphorus: 4 mg/dL (ref 2.5–4.6)

## 2023-04-30 LAB — LIPID PANEL
Cholesterol: 129 mg/dL (ref 0–200)
HDL: 49 mg/dL (ref >40)
LDL Cholesterol: 66 mg/dL (ref 0–99)
Total CHOL/HDL Ratio: 2.6 {ratio}
Triglycerides: 68 mg/dL (ref <150)
VLDL: 14 mg/dL (ref 0–40)

## 2023-04-30 LAB — CK: Total CK: 80 U/L (ref 49–397)

## 2023-04-30 LAB — MAGNESIUM: Magnesium: 2.4 mg/dL (ref 1.7–2.4)

## 2023-04-30 MED ORDER — ASPIRIN 81 MG PO TBEC
81.0000 mg | DELAYED_RELEASE_TABLET | Freq: Every day | ORAL | Status: DC
  Administered 2023-04-30: 81 mg via ORAL
  Filled 2023-04-30: qty 1

## 2023-04-30 MED ORDER — ASPIRIN 81 MG PO TBEC: 81.0000 mg | DELAYED_RELEASE_TABLET | Freq: Every day | ORAL | Status: AC

## 2023-04-30 MED ORDER — CLOPIDOGREL BISULFATE 75 MG PO TABS
75.0000 mg | ORAL_TABLET | Freq: Every day | ORAL | Status: DC
  Administered 2023-04-30: 75 mg via ORAL
  Filled 2023-04-30: qty 1

## 2023-04-30 MED ORDER — CLOPIDOGREL BISULFATE 75 MG PO TABS: 75.0000 mg | ORAL_TABLET | Freq: Every day | ORAL | 3 refills | Status: DC

## 2023-04-30 NOTE — Discharge Summary (Addendum)
is warm and dry.  Neurological:     General: No focal deficit present.     Mental Status: He is alert.  Psychiatric:        Mood and Affect: Mood normal.        Behavior: Behavior normal.      The results of significant diagnostics from this hospitalization (including imaging, microbiology, ancillary and laboratory) are listed below for reference.   Microbiology: No results found for this or any previous visit (from the past 240 hour(s)).   Labs: BNP (last 3 results) Recent Labs    05/04/22 0953 06/04/22 1057 11/30/22 1129  BNP 490.0* 356.1* 612.9*   Basic Metabolic Panel: Recent Labs  Lab 04/29/23 1427 04/29/23 1432 04/30/23 0650  NA 137 140  --   K 4.4 4.5  --   CL 102 103  --   CO2 27  --   --   GLUCOSE 115* 109*  --   BUN 26* 28*  --   CREATININE 1.91* 1.90*  --   CALCIUM 9.0  --   --   MG  --   --  2.4  PHOS  --   --  4.0   Liver Function Tests: Recent Labs  Lab 04/29/23 1427  AST 24  ALT 22  ALKPHOS 56  BILITOT 0.9  PROT 6.5  ALBUMIN 3.6   No results for input(s): "LIPASE", "AMYLASE" in the last 168 hours. No results for input(s): "AMMONIA" in the last 168 hours. CBC: Recent Labs  Lab 04/29/23 1427 04/29/23 1432  WBC 6.3  --   NEUTROABS 4.3  --   HGB 14.4 15.0  HCT 45.2 44.0  MCV 99.8  --   PLT 141*  --    Cardiac Enzymes: Recent Labs  Lab  04/30/23 0650  CKTOTAL 80   BNP: Invalid input(s): "POCBNP" CBG: No results for input(s): "GLUCAP" in the last 168 hours. D-Dimer No results for input(s): "DDIMER" in the last 72 hours. Hgb A1c Recent Labs    04/29/23 2358  HGBA1C 6.1*   Lipid Profile Recent Labs    04/30/23 0650  CHOL 129  HDL 49  LDLCALC 66  TRIG 68  CHOLHDL 2.6   Thyroid function studies No results for input(s): "TSH", "T4TOTAL", "T3FREE", "THYROIDAB" in the last 72 hours.  Invalid input(s): "FREET3" Anemia work up No results for input(s): "VITAMINB12", "FOLATE", "FERRITIN", "TIBC", "IRON", "RETICCTPCT" in the last 72 hours. Urinalysis    Component Value Date/Time   COLORURINE YELLOW 03/23/2022 1329   APPEARANCEUR CLEAR 03/23/2022 1329   LABSPEC 1.013 03/23/2022 1329   PHURINE 6.0 03/23/2022 1329   GLUCOSEU >=500 (A) 03/23/2022 1329   HGBUR SMALL (A) 03/23/2022 1329   BILIRUBINUR NEGATIVE 03/23/2022 1329   KETONESUR NEGATIVE 03/23/2022 1329   PROTEINUR NEGATIVE 03/23/2022 1329   UROBILINOGEN 0.2 05/25/2015 2115   NITRITE NEGATIVE 03/23/2022 1329   LEUKOCYTESUR NEGATIVE 03/23/2022 1329   Sepsis Labs Recent Labs  Lab 04/29/23 1427  WBC 6.3   Microbiology No results found for this or any previous visit (from the past 240 hour(s)).  Procedures/Studies: VAS US CAROTID  Result Date: 04/30/2023 Carotid Arterial Duplex Study Patient Name:  [name]  Date of Exam:   04/30/2023 Medical Rec #: [id number]       Accession #:    [id number] Date of Birth: 06-12-1936       Patient Gender: M Patient Age:   34 years Exam Location:  [facility] Procedure:  Physician Discharge Summary   [name]:[id number] DOB: 1936/06/14 DOA: 04/29/2023  PCP: [name], MD  Admit date: 04/29/2023 Discharge date:  04/30/2023  Admitted From: Home Disposition:  Home Discharging physician: Lewie Chamber, MD Barriers to discharge: none  Recommendations at discharge: Follow up with vascular surgery for intervention on 10/7 Referral made to neurology for parkinsonian features (tremor, mild cogwheeling, and bradykinesia)   Discharge Condition: stable CODE STATUS: Full Diet recommendation:  Diet Orders (From admission, onward)     Start     Ordered   04/30/23 0207  Diet Heart Room service appropriate? Yes; Fluid consistency: Thin  Diet effective now       Question Answer Comment  Room service appropriate? Yes   Fluid consistency: Thin      04/30/23 0206   04/30/23 0000  Diet - low sodium heart healthy        04/30/23 1442            Hospital Course: Mr. Fadeley is an 87 yo male with PMH CAD, chronic afib on Eliquis, MR s/p mTEER 03/25/22, CKD3b, HTN, HLD who presented with transient vision loss involving the right eye.  He had also noticed some numbness involving both arms and therefore presented for further evaluation.  Stroke workup was commenced on admission and neurology was consulted.  MRI brain was negative for acute findings or underlying stroke.  Chronic small vessel ischemia and volume loss noted. CT angio head/neck was performed which showed age-indeterminate infarct involving right caudate nucleus and nonocclusive calcified thrombus along anterior M4 branch of right MCA and approximately 60% stenosis of proximal right cervical ICA. This was followed with carotid ultrasound.  Results were reviewed by vascular surgery and neurology. Given the patient's presentation with presumed amaurosis fugax with duration approximately 10 minutes, it was felt that ICA stenosis was the culprit for his symptoms.  He was offered intervention with  vascular surgery and will follow-up outpatient for probable TCAR.  He was started on triple therapy at discharge.  He is already on reduced dose Eliquis in setting of his age and renal function.  Aspirin and Plavix were ordered and continued at discharge to complete triple therapy.   The patient's acute and chronic medical conditions were treated accordingly. On day of discharge, patient was felt deemed stable for discharge. Patient/family member advised to call PCP or come back to ER if needed.   Principal Diagnosis: Vision loss of right eye  Discharge Diagnoses: Active Hospital Problems   Diagnosis Date Noted   Amaurosis fugax of right eye 04/30/2023    Priority: 1.   CAD (coronary artery disease) 03/25/2022   S/P mitral valve clip implantation 03/25/2022   Chronic atrial fibrillation (HCC) 12/20/2021   Essential hypertension 12/02/2021    Resolved Hospital Problems   Diagnosis Date Noted Date Resolved   Vision loss of right eye 04/29/2023 04/30/2023    Priority: 1.     Discharge Instructions     Ambulatory referral to Neurology   Complete by: As directed    An appointment is requested in approximately: 4 weeks   Diet - low sodium heart healthy   Complete by: As directed    Increase activity slowly   Complete by: As directed       Allergies as of 04/30/2023   No Known Allergies      Medication List     TAKE these medications    acetaminophen 500 MG tablet Commonly known as: TYLENOL Take 500 mg  is warm and dry.  Neurological:     General: No focal deficit present.     Mental Status: He is alert.  Psychiatric:        Mood and Affect: Mood normal.        Behavior: Behavior normal.      The results of significant diagnostics from this hospitalization (including imaging, microbiology, ancillary and laboratory) are listed below for reference.   Microbiology: No results found for this or any previous visit (from the past 240 hour(s)).   Labs: BNP (last 3 results) Recent Labs    05/04/22 0953 06/04/22 1057 11/30/22 1129  BNP 490.0* 356.1* 612.9*   Basic Metabolic Panel: Recent Labs  Lab 04/29/23 1427 04/29/23 1432 04/30/23 0650  NA 137 140  --   K 4.4 4.5  --   CL 102 103  --   CO2 27  --   --   GLUCOSE 115* 109*  --   BUN 26* 28*  --   CREATININE 1.91* 1.90*  --   CALCIUM 9.0  --   --   MG  --   --  2.4  PHOS  --   --  4.0   Liver Function Tests: Recent Labs  Lab 04/29/23 1427  AST 24  ALT 22  ALKPHOS 56  BILITOT 0.9  PROT 6.5  ALBUMIN 3.6   No results for input(s): "LIPASE", "AMYLASE" in the last 168 hours. No results for input(s): "AMMONIA" in the last 168 hours. CBC: Recent Labs  Lab 04/29/23 1427 04/29/23 1432  WBC 6.3  --   NEUTROABS 4.3  --   HGB 14.4 15.0  HCT 45.2 44.0  MCV 99.8  --   PLT 141*  --    Cardiac Enzymes: Recent Labs  Lab  04/30/23 0650  CKTOTAL 80   BNP: Invalid input(s): "POCBNP" CBG: No results for input(s): "GLUCAP" in the last 168 hours. D-Dimer No results for input(s): "DDIMER" in the last 72 hours. Hgb A1c Recent Labs    04/29/23 2358  HGBA1C 6.1*   Lipid Profile Recent Labs    04/30/23 0650  CHOL 129  HDL 49  LDLCALC 66  TRIG 68  CHOLHDL 2.6   Thyroid function studies No results for input(s): "TSH", "T4TOTAL", "T3FREE", "THYROIDAB" in the last 72 hours.  Invalid input(s): "FREET3" Anemia work up No results for input(s): "VITAMINB12", "FOLATE", "FERRITIN", "TIBC", "IRON", "RETICCTPCT" in the last 72 hours. Urinalysis    Component Value Date/Time   COLORURINE YELLOW 03/23/2022 1329   APPEARANCEUR CLEAR 03/23/2022 1329   LABSPEC 1.013 03/23/2022 1329   PHURINE 6.0 03/23/2022 1329   GLUCOSEU >=500 (A) 03/23/2022 1329   HGBUR SMALL (A) 03/23/2022 1329   BILIRUBINUR NEGATIVE 03/23/2022 1329   KETONESUR NEGATIVE 03/23/2022 1329   PROTEINUR NEGATIVE 03/23/2022 1329   UROBILINOGEN 0.2 05/25/2015 2115   NITRITE NEGATIVE 03/23/2022 1329   LEUKOCYTESUR NEGATIVE 03/23/2022 1329   Sepsis Labs Recent Labs  Lab 04/29/23 1427  WBC 6.3   Microbiology No results found for this or any previous visit (from the past 240 hour(s)).  Procedures/Studies: VAS US CAROTID  Result Date: 04/30/2023 Carotid Arterial Duplex Study Patient Name:  [name]  Date of Exam:   04/30/2023 Medical Rec #: [id number]       Accession #:    [id number] Date of Birth: 06-12-1936       Patient Gender: M Patient Age:   34 years Exam Location:  [facility] Procedure:  Physician Discharge Summary   [name]:[id number] DOB: 1936/06/14 DOA: 04/29/2023  PCP: [name], MD  Admit date: 04/29/2023 Discharge date:  04/30/2023  Admitted From: Home Disposition:  Home Discharging physician: Lewie Chamber, MD Barriers to discharge: none  Recommendations at discharge: Follow up with vascular surgery for intervention on 10/7 Referral made to neurology for parkinsonian features (tremor, mild cogwheeling, and bradykinesia)   Discharge Condition: stable CODE STATUS: Full Diet recommendation:  Diet Orders (From admission, onward)     Start     Ordered   04/30/23 0207  Diet Heart Room service appropriate? Yes; Fluid consistency: Thin  Diet effective now       Question Answer Comment  Room service appropriate? Yes   Fluid consistency: Thin      04/30/23 0206   04/30/23 0000  Diet - low sodium heart healthy        04/30/23 1442            Hospital Course: Mr. Fadeley is an 87 yo male with PMH CAD, chronic afib on Eliquis, MR s/p mTEER 03/25/22, CKD3b, HTN, HLD who presented with transient vision loss involving the right eye.  He had also noticed some numbness involving both arms and therefore presented for further evaluation.  Stroke workup was commenced on admission and neurology was consulted.  MRI brain was negative for acute findings or underlying stroke.  Chronic small vessel ischemia and volume loss noted. CT angio head/neck was performed which showed age-indeterminate infarct involving right caudate nucleus and nonocclusive calcified thrombus along anterior M4 branch of right MCA and approximately 60% stenosis of proximal right cervical ICA. This was followed with carotid ultrasound.  Results were reviewed by vascular surgery and neurology. Given the patient's presentation with presumed amaurosis fugax with duration approximately 10 minutes, it was felt that ICA stenosis was the culprit for his symptoms.  He was offered intervention with  vascular surgery and will follow-up outpatient for probable TCAR.  He was started on triple therapy at discharge.  He is already on reduced dose Eliquis in setting of his age and renal function.  Aspirin and Plavix were ordered and continued at discharge to complete triple therapy.   The patient's acute and chronic medical conditions were treated accordingly. On day of discharge, patient was felt deemed stable for discharge. Patient/family member advised to call PCP or come back to ER if needed.   Principal Diagnosis: Vision loss of right eye  Discharge Diagnoses: Active Hospital Problems   Diagnosis Date Noted   Amaurosis fugax of right eye 04/30/2023    Priority: 1.   CAD (coronary artery disease) 03/25/2022   S/P mitral valve clip implantation 03/25/2022   Chronic atrial fibrillation (HCC) 12/20/2021   Essential hypertension 12/02/2021    Resolved Hospital Problems   Diagnosis Date Noted Date Resolved   Vision loss of right eye 04/29/2023 04/30/2023    Priority: 1.     Discharge Instructions     Ambulatory referral to Neurology   Complete by: As directed    An appointment is requested in approximately: 4 weeks   Diet - low sodium heart healthy   Complete by: As directed    Increase activity slowly   Complete by: As directed       Allergies as of 04/30/2023   No Known Allergies      Medication List     TAKE these medications    acetaminophen 500 MG tablet Commonly known as: TYLENOL Take 500 mg  is warm and dry.  Neurological:     General: No focal deficit present.     Mental Status: He is alert.  Psychiatric:        Mood and Affect: Mood normal.        Behavior: Behavior normal.      The results of significant diagnostics from this hospitalization (including imaging, microbiology, ancillary and laboratory) are listed below for reference.   Microbiology: No results found for this or any previous visit (from the past 240 hour(s)).   Labs: BNP (last 3 results) Recent Labs    05/04/22 0953 06/04/22 1057 11/30/22 1129  BNP 490.0* 356.1* 612.9*   Basic Metabolic Panel: Recent Labs  Lab 04/29/23 1427 04/29/23 1432 04/30/23 0650  NA 137 140  --   K 4.4 4.5  --   CL 102 103  --   CO2 27  --   --   GLUCOSE 115* 109*  --   BUN 26* 28*  --   CREATININE 1.91* 1.90*  --   CALCIUM 9.0  --   --   MG  --   --  2.4  PHOS  --   --  4.0   Liver Function Tests: Recent Labs  Lab 04/29/23 1427  AST 24  ALT 22  ALKPHOS 56  BILITOT 0.9  PROT 6.5  ALBUMIN 3.6   No results for input(s): "LIPASE", "AMYLASE" in the last 168 hours. No results for input(s): "AMMONIA" in the last 168 hours. CBC: Recent Labs  Lab 04/29/23 1427 04/29/23 1432  WBC 6.3  --   NEUTROABS 4.3  --   HGB 14.4 15.0  HCT 45.2 44.0  MCV 99.8  --   PLT 141*  --    Cardiac Enzymes: Recent Labs  Lab  04/30/23 0650  CKTOTAL 80   BNP: Invalid input(s): "POCBNP" CBG: No results for input(s): "GLUCAP" in the last 168 hours. D-Dimer No results for input(s): "DDIMER" in the last 72 hours. Hgb A1c Recent Labs    04/29/23 2358  HGBA1C 6.1*   Lipid Profile Recent Labs    04/30/23 0650  CHOL 129  HDL 49  LDLCALC 66  TRIG 68  CHOLHDL 2.6   Thyroid function studies No results for input(s): "TSH", "T4TOTAL", "T3FREE", "THYROIDAB" in the last 72 hours.  Invalid input(s): "FREET3" Anemia work up No results for input(s): "VITAMINB12", "FOLATE", "FERRITIN", "TIBC", "IRON", "RETICCTPCT" in the last 72 hours. Urinalysis    Component Value Date/Time   COLORURINE YELLOW 03/23/2022 1329   APPEARANCEUR CLEAR 03/23/2022 1329   LABSPEC 1.013 03/23/2022 1329   PHURINE 6.0 03/23/2022 1329   GLUCOSEU >=500 (A) 03/23/2022 1329   HGBUR SMALL (A) 03/23/2022 1329   BILIRUBINUR NEGATIVE 03/23/2022 1329   KETONESUR NEGATIVE 03/23/2022 1329   PROTEINUR NEGATIVE 03/23/2022 1329   UROBILINOGEN 0.2 05/25/2015 2115   NITRITE NEGATIVE 03/23/2022 1329   LEUKOCYTESUR NEGATIVE 03/23/2022 1329   Sepsis Labs Recent Labs  Lab 04/29/23 1427  WBC 6.3   Microbiology No results found for this or any previous visit (from the past 240 hour(s)).  Procedures/Studies: VAS US CAROTID  Result Date: 04/30/2023 Carotid Arterial Duplex Study Patient Name:  [name]  Date of Exam:   04/30/2023 Medical Rec #: [id number]       Accession #:    [id number] Date of Birth: 06-12-1936       Patient Gender: M Patient Age:   34 years Exam Location:  [facility] Procedure:  is warm and dry.  Neurological:     General: No focal deficit present.     Mental Status: He is alert.  Psychiatric:        Mood and Affect: Mood normal.        Behavior: Behavior normal.      The results of significant diagnostics from this hospitalization (including imaging, microbiology, ancillary and laboratory) are listed below for reference.   Microbiology: No results found for this or any previous visit (from the past 240 hour(s)).   Labs: BNP (last 3 results) Recent Labs    05/04/22 0953 06/04/22 1057 11/30/22 1129  BNP 490.0* 356.1* 612.9*   Basic Metabolic Panel: Recent Labs  Lab 04/29/23 1427 04/29/23 1432 04/30/23 0650  NA 137 140  --   K 4.4 4.5  --   CL 102 103  --   CO2 27  --   --   GLUCOSE 115* 109*  --   BUN 26* 28*  --   CREATININE 1.91* 1.90*  --   CALCIUM 9.0  --   --   MG  --   --  2.4  PHOS  --   --  4.0   Liver Function Tests: Recent Labs  Lab 04/29/23 1427  AST 24  ALT 22  ALKPHOS 56  BILITOT 0.9  PROT 6.5  ALBUMIN 3.6   No results for input(s): "LIPASE", "AMYLASE" in the last 168 hours. No results for input(s): "AMMONIA" in the last 168 hours. CBC: Recent Labs  Lab 04/29/23 1427 04/29/23 1432  WBC 6.3  --   NEUTROABS 4.3  --   HGB 14.4 15.0  HCT 45.2 44.0  MCV 99.8  --   PLT 141*  --    Cardiac Enzymes: Recent Labs  Lab  04/30/23 0650  CKTOTAL 80   BNP: Invalid input(s): "POCBNP" CBG: No results for input(s): "GLUCAP" in the last 168 hours. D-Dimer No results for input(s): "DDIMER" in the last 72 hours. Hgb A1c Recent Labs    04/29/23 2358  HGBA1C 6.1*   Lipid Profile Recent Labs    04/30/23 0650  CHOL 129  HDL 49  LDLCALC 66  TRIG 68  CHOLHDL 2.6   Thyroid function studies No results for input(s): "TSH", "T4TOTAL", "T3FREE", "THYROIDAB" in the last 72 hours.  Invalid input(s): "FREET3" Anemia work up No results for input(s): "VITAMINB12", "FOLATE", "FERRITIN", "TIBC", "IRON", "RETICCTPCT" in the last 72 hours. Urinalysis    Component Value Date/Time   COLORURINE YELLOW 03/23/2022 1329   APPEARANCEUR CLEAR 03/23/2022 1329   LABSPEC 1.013 03/23/2022 1329   PHURINE 6.0 03/23/2022 1329   GLUCOSEU >=500 (A) 03/23/2022 1329   HGBUR SMALL (A) 03/23/2022 1329   BILIRUBINUR NEGATIVE 03/23/2022 1329   KETONESUR NEGATIVE 03/23/2022 1329   PROTEINUR NEGATIVE 03/23/2022 1329   UROBILINOGEN 0.2 05/25/2015 2115   NITRITE NEGATIVE 03/23/2022 1329   LEUKOCYTESUR NEGATIVE 03/23/2022 1329   Sepsis Labs Recent Labs  Lab 04/29/23 1427  WBC 6.3   Microbiology No results found for this or any previous visit (from the past 240 hour(s)).  Procedures/Studies: VAS US CAROTID  Result Date: 04/30/2023 Carotid Arterial Duplex Study Patient Name:  [name]  Date of Exam:   04/30/2023 Medical Rec #: [id number]       Accession #:    [id number] Date of Birth: 06-12-1936       Patient Gender: M Patient Age:   34 years Exam Location:  [facility] Procedure:

## 2023-04-30 NOTE — Progress Notes (Signed)
Pt has been discharged home IV has been removed. Catheter is intact. Tele has been removed. Pt has received discharge instructions and states understanding. All belongings are with pt. Pt accompanied off floor with nursing staff via wheelchair.

## 2023-04-30 NOTE — Progress Notes (Signed)
Physical Therapy Note Spoke with patient and OT during occupational therapy evaluation. He seems back to baseline, but is aware of need for neuro follow-up due to Parkinsonian symptoms. OT has provided good resources for pt (eg. [employer] program, [employer], [employer] type programs for PD.) He denies any falls at home but does notice some increased instability that has gradually been more prevalent. He is agreeable to outpatient PT evaluation after d/c which I think would be appropriate. No acute PT needs currently with this admission. PT is signing-off. Please re-order if there is any significant change in status. Thank you for this referral.  Kathlyn Sacramento, PT, DPT [facility]  Rehabilitation Services Physical Therapist Office: [phone] Website: [email or web address]

## 2023-04-30 NOTE — Hospital Course (Signed)
[name] is an 87 yo male with PMH CAD, chronic afib on Eliquis, MR s/p mTEER 03/25/22, CKD3b, HTN, HLD who presented with transient vision loss involving the right eye.  He had also noticed some numbness involving both arms and therefore presented for further evaluation.  Stroke workup was commenced on admission and neurology was consulted.  MRI brain was negative for acute findings or underlying stroke.  Chronic small vessel ischemia and volume loss noted. CT angio head/neck was performed which showed age-indeterminate infarct involving right caudate nucleus and nonocclusive calcified thrombus along anterior M4 branch of right MCA and approximately 60% stenosis of proximal right cervical ICA. This was followed with carotid ultrasound.  Results were reviewed by vascular surgery and neurology. Given the patient's presentation with presumed amaurosis fugax with duration approximately 10 minutes, it was felt that ICA stenosis was the culprit for his symptoms.  He was offered intervention with vascular surgery and will follow-up outpatient for probable TCAR.  He was started on triple therapy at discharge.  He is already on reduced dose Eliquis in setting of his age and renal function.  Aspirin and Plavix were ordered and continued at discharge to complete triple therapy.

## 2023-04-30 NOTE — Consult Note (Signed)
Hospital Consult    Reason for Consult: Concern for symptomatic right ICA stenosis Requesting Physician: Neurology MRN #:  [id number]  History of Present Illness: This is a 87 y.o. male who presented transient vision loss in the right eye with numbness in bilateral upper extremities.  Fortunately, symptoms resolved.  Follow-up CT imaging demonstrated age-indeterminate infarct in the right caudate nucleus.  MRI was negative.  The right ICA was noted to have a 60% narrowing.  Vascular surgery was called for recommendations regarding symptomatic right ICA stenosis with superimposed parkinsonian symptoms.  On exam, [name] was doing well, and accompanied by his wife and daughter.  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  He has been retired for a significant amount of time, but continues to live an active, independent lifestyle with his wife.   Past Medical History:  Diagnosis Date   Arthritis    knees,back   CAD (coronary artery disease) CARDIOLOGIST-  DR HOCHREIN   previous myocardial infarction in 2001 with 90% circumflex lesion treated with PTCA. The LAD had 70-80% stenosis which was treawted with angioplasty. Most recent catheterization in 2003 demonstrated the LAD at 30% mid stenosis, circumflex 60-70% stenosis in the mid AV grove, and 40% stenosis in the previously stented area. The right coronary artery had 30-40% stenosis in the mid portion.    GERD (gastroesophageal reflux disease)    History of kidney stones    Hypertension    Hypothyroidism    Left ureteral stone    Pre-diabetes    S/P mitral valve clip implantation 03/25/2022   s/p TEER wtih two XTW MitraClips by Dr. Excell Seltzer and Lynnette Caffey   Severe mitral regurgitation    Skin cancer    face    Past Surgical History:  Procedure Laterality Date   APPENDECTOMY  as teen   BUBBLE STUDY  12/11/2021   Procedure: BUBBLE STUDY;  Surgeon: Parke Poisson,  MD;  Location: [facility] ENDOSCOPY;  Service: Cardiology;;   CARDIAC CATHETERIZATION  03-29-2000   dr hochrein   Nonobstuctive CAD /   CFX patent stent and LAD diffuse luminal irregularities including mid segment previously been angioplastied   CARDIOVASCULAR STRESS TEST  05-09-2015   dr hochrein   low risk perfusion study/  no reversible ischemia/  normal LV function and wall motion , ef 63%   CARDIOVERSION N/A 04/16/2022   Procedure: CARDIOVERSION;  Surgeon: Sande Rives, MD;  Location: [facility] ENDOSCOPY;  Service: Cardiovascular;  Laterality: N/A;   CATARACT EXTRACTION W/ INTRAOCULAR LENS  IMPLANT, BILATERAL  left 05-06-2015/  right 06-10-2015   CORONARY ANGIOPLASTY WITH STENT PLACEMENT  01-21-2000  dr hochrein   PTCA to pLAD/  PTCA and BMS x1 to mid LCFX/  normal LVSF   CORONARY ANGIOPLASTY WITH STENT PLACEMENT  01-10-2002  dr Chales Abrahams   PTCA and BMS to mid AV LCFX (in-stent restenosis)/  diffuse 30-40% mRCA ,  mLAD diffuse 30%,  normal LVSF, ef 65%   CYSTOSCOPY/RETROGRADE/URETEROSCOPY/STONE EXTRACTION WITH BASKET Left 05/23/2015   Procedure: CYSTOSCOPY, FULGERATION OF PROSTATE,  LEFT URETEROSCOPY, JJ STENT PLACEMENT;  Surgeon: Jerilee Field, MD;  Location: [facility];  Service: Urology;  Laterality: Left;   ELECTROPHYSIOLOGIC STUDY  03-30-2000   dr Graciela Husbands   EXTRACORPOREAL SHOCK WAVE LITHOTRIPSY Left 10-30-2012   EXTRACORPOREAL SHOCK WAVE LITHOTRIPSY Right 11/16/2021   Procedure: EXTRACORPOREAL SHOCK WAVE LITHOTRIPSY (ESWL);  Surgeon: Marcine Matar, MD;  Location: [facility];  Service: Urology;  Laterality: Right;   HOLMIUM LASER APPLICATION Left 05/23/2015   Procedure: WITH HOLMIUM LASER LITHOTRIPSY;  Surgeon: Jerilee Field, MD;  Location: [facility];  Service: Urology;  Laterality: Left;   KNEE ARTHROSCOPY Bilateral x2 right/  left 2015   LUMBAR LAMINECTOMY/DECOMPRESSION MICRODISCECTOMY N/A 09/28/2012   Procedure: L3  -L5 DECOMPRESSION L4 -  L5 FUSION WITH INSTRUMENTATION 2 LEVELS;  Surgeon: Venita Lick, MD;  Location: [facility] OR;  Service: Orthopedics;  Laterality: N/A;   MITRAL VALVE REPAIR N/A 03/25/2022   Procedure: MITRAL VALVE REPAIR;  Surgeon: Orbie Pyo, MD;  Location: [facility] INVASIVE CV LAB;  Service: Cardiovascular;  Laterality: N/A;   RIGHT/LEFT HEART CATH AND CORONARY ANGIOGRAPHY N/A 01/27/2022   Procedure: RIGHT/LEFT HEART CATH AND CORONARY ANGIOGRAPHY;  Surgeon: Orbie Pyo, MD;  Location: [facility] INVASIVE CV LAB;  Service: Cardiovascular;  Laterality: N/A;   TEE WITHOUT CARDIOVERSION N/A 12/11/2021   Procedure: TRANSESOPHAGEAL ECHOCARDIOGRAM (TEE);  Surgeon: Parke Poisson, MD;  Location: [facility] ENDOSCOPY;  Service: Cardiology;  Laterality: N/A;   TEE WITHOUT CARDIOVERSION N/A 03/25/2022   Procedure: TRANSESOPHAGEAL ECHOCARDIOGRAM (TEE);  Surgeon: Orbie Pyo, MD;  Location: [facility] INVASIVE CV LAB;  Service: Cardiovascular;  Laterality: N/A;   TONSILLECTOMY  as child   TOTAL KNEE ARTHROPLASTY Left 10/02/2018   Procedure: TOTAL LEFT  KNEE ARTHROPLASTY;  Surgeon: Durene Romans, MD;  Location: [facility] ORS;  Service: Orthopedics;  Laterality: Left;  90 mins- Ok per Tiffany   TOTAL SHOULDER ARTHROPLASTY Right 01/14/2011   TRANSTHORACIC ECHOCARDIOGRAM  05-21-2015   mild LVH,  ef 55-605,  grade 2 diastolic dysfunction, basal inferior hyperkinesis/  moderate AV calcification sclerosis without stenosis/  trivial MR/  moderate LAE/  mild TR/  mild RAE    No Known Allergies  Prior to Admission medications   Medication Sig Start Date End Date Taking? Authorizing Provider  acetaminophen (TYLENOL) 500 MG tablet Take 500 mg by mouth every 6 (six) hours as needed for moderate pain.   Yes [provider]  amiodarone (PACERONE) 200 MG tablet Take 1 tablet (200 mg total) by mouth daily. 12/15/22  Yes Laurey Morale, MD  amLODipine (NORVASC) 2.5 MG tablet Take 1 tablet (2.5 mg total) by mouth daily. 04/01/23 06/30/23 Yes [place], Anderson Malta, FNP  apixaban (ELIQUIS) 5 MG TABS tablet Take 1/2 tablet by mouth twice daily 11/16/22  Yes Rollene Rotunda, MD  atorvastatin (LIPITOR) 80 MG tablet Take 1 tablet (80 mg total) by mouth at bedtime. 11/26/13  Yes Rollene Rotunda, MD  Cholecalciferol (VITAMIN D) 50 MCG (2000 UT) tablet Take 2,000 Units by mouth daily.   Yes [provider]  Cyanocobalamin (B-12) 5000 MCG CAPS Take 5,000 mcg by mouth daily.   Yes [provider]  dapagliflozin propanediol (FARXIGA) 10 MG TABS tablet Take 1 tablet (10 mg total) by mouth daily before breakfast. 05/28/22  Yes Laurey Morale, MD  ferrous sulfate 325 (65 FE) MG tablet Take 325 mg by mouth daily with breakfast.   Yes [provider]  finasteride (PROSCAR) 5 MG tablet Take 5 mg by mouth daily.   Yes [provider]  folic acid (FOLVITE) 800 MCG tablet Take 800 mcg by mouth daily.    Yes [provider]  furosemide (LASIX) 20 MG tablet Take 1 tablet (20 mg total) by mouth every other day. 10/01/22  Yes Rollene Rotunda, MD  levothyroxine (SYNTHROID) 25 MCG tablet Take 50 mcg by mouth daily before breakfast.  Yes [provider]  meclizine (ANTIVERT) 25 MG tablet Take 25 mg by mouth daily as needed for dizziness.   Yes [provider]  niacin 500 MG tablet Take 500 mg by mouth daily.   Yes [provider]  nitroGLYCERIN (NITROSTAT) 0.4 MG SL tablet Place 1 tablet (0.4 mg total) under the tongue every 5 (five) minutes as needed for chest pain. 04/11/23  Yes Laurey Morale, MD  pantoprazole (PROTONIX) 40 MG tablet Take 1 tablet (40 mg total) by mouth daily. 08/20/22  Yes Tonny Bollman, MD    Social History   Socioeconomic History   Marital status: Married    Spouse name: Not on file   Number of children: 2   Years of education: Not on file   Highest education level: Not on file  Occupational History   Occupation: retired  Tobacco Use   Smoking status: Former    Current  packs/day: 0.00    Average packs/day: 1 pack/day for 3.0 years (3.0 ttl pk-yrs)    Types: Cigarettes    Start date: 09/27/1959    Quit date: 09/26/1962    Years since quitting: 60.6   Smokeless tobacco: Never  Vaping Use   Vaping status: Never Used  Substance and Sexual Activity   Alcohol use: No   Drug use: No   Sexual activity: Not Currently  Other Topics Concern   Not on file  Social History Narrative   Lives at home with wife.  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     Social Determinants of Health   Financial Resource Strain: Not on file  Food Insecurity: No Food Insecurity (04/29/2023)   Hunger Vital Sign    Worried About Running Out of Food in the Last Year: Never true    Ran Out of Food in the Last Year: Never true  Transportation Needs: No Transportation Needs (04/29/2023)   PRAPARE - Administrator, Civil Service (Medical): No    Lack of Transportation (Non-Medical): No  Physical Activity: Not on file  Stress: Not on file  Social Connections: Not on file  Intimate Partner Violence: Not At Risk (04/29/2023)   Humiliation, Afraid, Rape, and Kick questionnaire    Fear of Current or Ex-Partner: No    Emotionally Abused: No    Physically Abused: No    Sexually Abused: No   Family History  Problem Relation Age of Onset   Heart failure Mother    Prostate cancer Father     ROS: Otherwise negative unless mentioned in HPI  Physical Examination  Vitals:   04/30/23 0731 04/30/23 1117  BP: (!) 143/63 (!) 146/65  Pulse: (!) 56 (!) 59  Resp: 19 16  Temp: 98 F (36.7 C) 97.9 F (36.6 C)  SpO2: 99% 100%   Body mass index is 25.1 kg/m.  General:  WDWN in NAD Gait: Not observed HENT: WNL, normocephalic Pulmonary: normal non-labored breathing, without Rales, rhonchi,  wheezing Cardiac: irregular Abdomen: soft, NT/ND, no masses Skin: without rashes Vascular Exam/Pulses: 2+ radials, 2+ DPs Extremities: without ischemic changes, without Gangrene , without cellulitis;  without open wounds;  Musculoskeletal: no muscle wasting or atrophy  Neurologic: A&O X 3;  No focal weakness or paresthesias are detected; speech is fluent/normal Psychiatric:  The pt has Normal affect. Lymph:  Unremarkable  CBC    Component Value Date/Time   WBC 6.3 04/29/2023 1427   RBC 4.53 04/29/2023 1427   HGB 15.0 04/29/2023 1432   HGB 13.0 03/31/2022 1002  HCT 44.0 04/29/2023 1432   HCT 38.4 03/31/2022 1002   PLT 141 (L) 04/29/2023 1427   PLT 119 (L) 03/31/2022 1002   MCV 99.8 04/29/2023 1427   MCV 91 03/31/2022 1002   MCH 31.8 04/29/2023 1427   MCHC 31.9 04/29/2023 1427   RDW 14.4 04/29/2023 1427   RDW 16.5 (H) 03/31/2022 1002   LYMPHSABS 1.3 04/29/2023 1427   LYMPHSABS 1.3 12/09/2021 1145   MONOABS 0.5 04/29/2023 1427   EOSABS 0.1 04/29/2023 1427   EOSABS 0.1 12/09/2021 1145   BASOSABS 0.0 04/29/2023 1427   BASOSABS 0.0 12/09/2021 1145    BMET    Component Value Date/Time   NA 140 04/29/2023 1432   NA 140 03/31/2022 1002   K 4.5 04/29/2023 1432   CL 103 04/29/2023 1432   CO2 27 04/29/2023 1427   GLUCOSE 109 (H) 04/29/2023 1432   BUN 28 (H) 04/29/2023 1432   BUN 36 (H) 03/31/2022 1002   CREATININE 1.90 (H) 04/29/2023 1432   CALCIUM 9.0 04/29/2023 1427   GFRNONAA 34 (L) 04/29/2023 1427   GFRAA 54 (L) 10/03/2018 0543    COAGS: Lab Results  Component Value Date   INR 1.1 04/29/2023   INR 1.1 03/23/2022   INR 1.06 05/23/2015     ASSESSMENT/PLAN: This is a 87 y.o. male who recently presented with symptoms consistent with amaurosis in the right eye and CT imaging demonstrating a lesion within the right caudate nucleus.  There was no imaging on MRI.  Upon evaluation of the right internal carotid artery, there was a greater than 60% stenotic lesion on CT, however on duplex ultrasound the lesion appeared to have less than 40% stenosis.  I spoke directly with neurology regarding all imaging findings as well as the patient's presentation.  We are in agreement  that the lesion demonstrate 60% stenosis on CT and is felt to be the nidus for the amaurosis symptoms, especially as the patient was anticoagulated on Eliquis.  I had a long conversation with [name], his wife, and his daughter regarding the above.  We discussed that with symptomatic ICA stenosis his future stroke risk is 26% over the next 2 years, and with carotid revascularization, this is decreased to 9%.  [name] is a highly functional and independent 87 year old who has a long life expectancy.  I think it is reasonable to offer him surgery.  We discussed the risks and benefits of both carotid endarterectomy and transcarotid artery revascularization.  [name] elected to proceed with transcarotid artery revascularization.  He is aware this will require triple therapy for 30 days postop, after which he will be aspirin and Eliquis.  Please start triple therapy today-aspirin, Plavix, Eliquis.  He will stop his Eliquis 2 days prior to surgery.    My plan is to perform his right sided transcarotid artery revascularization next Monday, 05/09/2023.  He is aware that triple therapy comes with an increased risk of bleeding.  If he is unable to tolerate this, we would discuss medical management versus carotid endarterectomy.  I have reached out to my office to schedule his surgery. He was asked to call should any questions or concerns arise. He is okay for discharge from a vascular surgery perspective.  Victorino Sparrow MD [facility] [phone] 04/30/2023  1:44 PM

## 2023-04-30 NOTE — Evaluation (Addendum)
Occupational Therapy Evaluation Patient Details Name: [name] MRN: [id number] DOB: 08-21-1935 Today's Date: 04/30/2023   History of Present Illness Pt is an 87 y.o. male presenting 9/27 with R eye vision loss and numbness in BUE. Symptoms resolved. MRI brain with no acute intracranial abnormality. PMH signficiant for CAD, hypertension, dyslipidemia, chronic A-fib on apixaban, CKD 3B.   Clinical Impression   PTA, pt lived at home with his wife who he assists and was independent in ADL and IADL. Upon eval, pt performing BADL with mod I, however, observed with some mild gait instability this session, decreased stride length, decreased coordination, BUE tremor, R>L and difficulty with tasks requiring multisystem involvement. Pt reports that the MD told him they think he has PD. General PD education provided. Pt reporting all vision issues have resolved and pt able to track without difficulty, read, and with intact visual fields. Recommending follow up with OP OT (preferably neuro esp if confirmed dx of PD).       If plan is discharge home, recommend the following: Other (comment) (on pt request)    Functional Status Assessment  Patient has had a recent decline in their functional status and demonstrates the ability to make significant improvements in function in a reasonable and predictable amount of time.  Equipment Recommendations  None recommended by OT    Recommendations for Other Services       Precautions / Restrictions Precautions Precautions: Fall      Mobility Bed Mobility Overal bed mobility: Modified Independent                  Transfers Overall transfer level: Modified independent                        Balance Overall balance assessment: Mild deficits observed, not formally tested (Good for basic activities. More off balance with reaching, retrieving items from floor, steps, etc)                                         ADL  either performed or assessed with clinical judgement   ADL Overall ADL's : Modified independent                                             Vision Baseline Vision/History: 0 No visual deficits (readers) Ability to See in Adequate Light: 0 Adequate Patient Visual Report: Other (comment) (Initially reported R eye vision loss but reports resolved.) Vision Assessment?: No apparent visual deficits Additional Comments: Visual fields, tracking, reading all intact     Perception Perception: Within Functional Limits       Praxis Praxis: WFL       Pertinent Vitals/Pain Pain Assessment Pain Assessment: No/denies pain     Extremity/Trunk Assessment Upper Extremity Assessment Upper Extremity Assessment: Right hand dominant (RUE tremor>L but BUE mildly tremulous.)   Lower Extremity Assessment Lower Extremity Assessment: Overall WFL for tasks assessed   Cervical / Trunk Assessment Cervical / Trunk Assessment: Normal   Communication Communication Communication: No apparent difficulties   Cognition Arousal: Alert Behavior During Therapy: WFL for tasks assessed/performed Overall Cognitive Status: Within Functional Limits for tasks assessed  General Comments: For typical daily routine. Pt with more difficulty with tasks involving multisystem involvement and executive function.     General Comments  see orthostatics sheet for orthostatics. SBP dropping 19 pts with STS    Exercises Exercises: Other exercises Other Exercises Other Exercises: PWR step! in standing position with prepare and activate and multisystem involvement if following with eyes and counting aloud,. Pt needed min A to perform appropriately.   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        OT Problem List: Decreased strength;Decreased activity tolerance;Impaired balance (sitting and/or standing);Decreased coordination      OT Treatment/Interventions: Self-care/ADL training;Neuromuscular education;Therapeutic exercise;DME and/or AE instruction;Cognitive remediation/compensation;Therapeutic activities;Patient/family education;Balance training    OT Goals(Current goals can be found in the care plan section) Acute Rehab OT Goals Patient Stated Goal: go home OT Goal Formulation: With patient Time For Goal Achievement: 05/14/23 Potential to Achieve Goals: Good  OT Frequency: Min 1X/week    Co-evaluation              AM-PAC OT "6 Clicks" Daily Activity     Outcome Measure Help from another person eating meals?: None Help from another person taking care of personal grooming?: None Help from another person toileting, which includes using toliet, bedpan, or urinal?: None Help from another person bathing (including washing, rinsing, drying)?: None Help from another person to put on and taking off regular upper body clothing?: None Help from another person to put on and taking off regular lower body clothing?: None 6 Click Score: 24   End of Session Equipment Utilized During Treatment: Gait belt Nurse Communication: Mobility status  Activity Tolerance: Patient tolerated treatment well Patient left: in chair;with call bell/phone within reach  OT Visit Diagnosis: Unsteadiness on feet (R26.81);Muscle weakness  (generalized) (M62.81)                Time: 6045-4098 OT Time Calculation (min): 51 min Charges:  OT General Charges $OT Visit: 1 Visit OT Evaluation $OT Eval Low Complexity: 1 Low OT Treatments $Self Care/Home Management : 23-37 mins  [name], OTR/L [facility] Acute Rehabilitation Office: [phone]   Myrla Halsted 04/30/2023, 12:25 PM

## 2023-04-30 NOTE — Progress Notes (Signed)
VASCULAR LAB    Carotid duplex has been performed.  See CV proc for preliminary results.   Ahnna Dungan, RVT 04/30/2023, 10:55 AM

## 2023-04-30 NOTE — Progress Notes (Addendum)
STROKE TEAM PROGRESS NOTE   BRIEF HPI [name] is a 87 y.o. male with history of  CAD, nephrolithiasis, hypothyroidism, Afibb on eliquis who woke up to a phone call from his daughter yesterday AM at 0830 and noticed vision from R eye was blurred and stayed that way for about 10 mins and then resolved. He remembers getting up to use the bathroom at 2AM and was fine at that time. Went to bed and in the afternoon, noticed that his BL arms were numb and tingly and tight like he had BP cuff around his arms. This was on both his arms and then resolved. He reports that he has been slow more recently. If he leans forward to get something from the floor, he falls and tips over.  He has not had anything like this ever happened in the past   SIGNIFICANT HOSPITAL EVENTS   INTERIM HISTORY/SUBJECTIVE Describes blurry vision in the right eye lasting approximately 10 minutes.  Denies previous events like this. Plans to follow up with vascular surgery for TCAR on Monday 05/09/2023.   OBJECTIVE  CBC    Component Value Date/Time   WBC 6.3 04/29/2023 1427   RBC 4.53 04/29/2023 1427   HGB 15.0 04/29/2023 1432   HGB 13.0 03/31/2022 1002   HCT 44.0 04/29/2023 1432   HCT 38.4 03/31/2022 1002   PLT 141 (L) 04/29/2023 1427   PLT 119 (L) 03/31/2022 1002   MCV 99.8 04/29/2023 1427   MCV 91 03/31/2022 1002   MCH 31.8 04/29/2023 1427   MCHC 31.9 04/29/2023 1427   RDW 14.4 04/29/2023 1427   RDW 16.5 (H) 03/31/2022 1002   LYMPHSABS 1.3 04/29/2023 1427   LYMPHSABS 1.3 12/09/2021 1145   MONOABS 0.5 04/29/2023 1427   EOSABS 0.1 04/29/2023 1427   EOSABS 0.1 12/09/2021 1145   BASOSABS 0.0 04/29/2023 1427   BASOSABS 0.0 12/09/2021 1145    BMET    Component Value Date/Time   NA 140 04/29/2023 1432   NA 140 03/31/2022 1002   K 4.5 04/29/2023 1432   CL 103 04/29/2023 1432   CO2 27 04/29/2023 1427   GLUCOSE 109 (H) 04/29/2023 1432   BUN 28 (H) 04/29/2023 1432   BUN 36 (H) 03/31/2022 1002    CREATININE 1.90 (H) 04/29/2023 1432   CALCIUM 9.0 04/29/2023 1427   EGFR 37 (L) 03/31/2022 1002   GFRNONAA 34 (L) 04/29/2023 1427    IMAGING past 24 hours MR BRAIN WO CONTRAST  Result Date: 04/30/2023 CLINICAL DATA:  Right eye ptosis EXAM: MRI HEAD WITHOUT CONTRAST TECHNIQUE: Multiplanar, multiecho pulse sequences of the brain and surrounding structures were obtained without intravenous contrast. COMPARISON:  04/29/2023 CTA head neck FINDINGS: Brain: No acute infarct, mass effect or extra-axial collection. No chronic microhemorrhage or siderosis. There is multifocal hyperintense T2-weighted signal within the white matter. Generalized volume loss. The midline structures are normal. Old basal ganglia small vessel infarcts. Vascular: Normal flow voids. Skull and upper cervical spine: Normal marrow signal. Sinuses/Orbits: Left mastoid fluid. Paranasal sinuses are clear. Bilateral ocular lens replacements. Other: None. IMPRESSION: 1. No acute intracranial abnormality. 2. Findings of chronic small vessel ischemia and volume loss. Electronically Signed   By: Deatra Robinson M.D.   On: 04/30/2023 02:26   CT ANGIO HEAD NECK W WO CM  Result Date: 04/29/2023 CLINICAL DATA:  Neuro deficit, persistent/recurrent, CNS neoplasm suspected. EXAM: CT ANGIOGRAPHY HEAD AND NECK WITH AND WITHOUT CONTRAST TECHNIQUE: Multidetector CT imaging of the head and neck was  performed using the standard protocol during bolus administration of intravenous contrast. Multiplanar CT image reconstructions and MIPs were obtained to evaluate the vascular anatomy. Carotid stenosis measurements (when applicable) are obtained utilizing NASCET criteria, using the distal internal carotid diameter as the denominator. RADIATION DOSE REDUCTION: This exam was performed according to the departmental dose-optimization program which includes automated exposure control, adjustment of the mA and/or kV according to patient size and/or use of iterative  reconstruction technique. CONTRAST:  70mL OMNIPAQUE IOHEXOL 350 MG/ML SOLN COMPARISON:  None Available. FINDINGS: CT HEAD FINDINGS Brain: No acute hemorrhage. Age-indeterminate infarct in the right caudate nucleus (axial image 17 series 3). Background of mild chronic small-vessel disease. No hydrocephalus or extra-axial collection. No mass effect or midline shift. Vascular: Punctate calcification along a vessel in the right sylvian fissure (axial image 16 series 3). Skull: No calvarial fracture or suspicious bone lesion. Skull base is unremarkable. Sinuses/Orbits: No acute finding. Other: None. Review of the MIP images confirms the above findings CTA NECK FINDINGS Aortic arch: Three-vessel arch configuration. Arch vessel origins are patent. Right carotid system: Densely calcified plaque results in proximally 60% stenosis of the proximal right cervical ICA. Left carotid system: No evidence of dissection, stenosis (50% or greater), or occlusion. Moderate calcified plaque along the left carotid bulb and proximal left cervical ICA. Vertebral arteries: Left dominant. No evidence of dissection, stenosis (50% or greater), or occlusion. Skeleton: Mild cervical spondylosis without high-grade spinal canal stenosis. Other neck: Unremarkable. Upper chest: Unremarkable. Review of the MIP images confirms the above findings CTA HEAD FINDINGS Anterior circulation: Intracranial ICAs are patent without stenosis or aneurysm. The proximal ACAs and MCAs are patent without stenosis or aneurysm. Nonocclusive calcified thrombus along an anterior M4 branch of the right MCA within the right sylvian fissure (axial image 76 series 9). Distal branches are symmetric. Posterior circulation: Normal basilar artery. The SCAs, AICAs and PICAs are patent proximally. The PCAs are patent proximally without stenosis or aneurysm. Distal branches are symmetric. Venous sinuses: As permitted by contrast timing, patent. Anatomic variants: None. Review of the  MIP images confirms the above findings IMPRESSION: 1. Age-indeterminate infarct in the right caudate nucleus. 2. Nonocclusive calcified thrombus along an anterior M4 branch of the right MCA within the right Sylvian fissure. 3. Densely calcified plaque results in approximately 60% stenosis of the proximal right cervical ICA. 4. No other hemodynamically significant stenosis or large vessel occlusion in the head or neck. Electronically Signed   By: Orvan Falconer M.D.   On: 04/29/2023 16:57    Vitals:   04/29/23 2030 04/29/23 2106 04/30/23 0321 04/30/23 0731  BP: 118/80 (!) 147/98 137/67 (!) 143/63  Pulse: (!) 53 (!) 55 67 (!) 56  Resp: 16 18 18 19   Temp:  (!) 97.5 F (36.4 C) 98.1 F (36.7 C) 98 F (36.7 C)  TempSrc:  Oral Oral Oral  SpO2: 100% 98% 94% 99%  Weight:      Height:         PHYSICAL EXAM General:  Alert, well-nourished, well-developed patient in no acute distress Psych:  Mood and affect appropriate for situation CV: Regular rate and rhythm on monitor Respiratory:  Regular, unlabored respirations on room air GI: Abdomen soft and nontender   NEURO:  Mental Status: AA&Ox3, patient is able to give clear and coherent history Speech/Language: speech is without dysarthria or aphasia.  Naming, repetition, fluency, and comprehension intact.  Cranial Nerves:  II: PERRL. Visual fields full.  III, IV, VI: EOMI. Eyelids elevate symmetrically.  V: Sensation is intact to light touch and symmetrical to face.  VII: Face is symmetrical resting and smiling VIII: hearing intact to voice. IX, X: Palate elevates symmetrically. Phonation is normal.  EP:PIRJJOAC shrug 5/5. XII: tongue is midline without fasciculations. Motor: 5/5 strength to all muscle groups tested.  Tone: is normal and bulk is normal Sensation- Intact to light touch bilaterally. Extinction absent to light touch to DSS.   Coordination: FTN intact bilaterally, RAM slowed, HKS: no ataxia in BLE.No drift.  Gait-  deferred  ASSESSMENT/PLAN  Right amaurosis fugax, etiology: Symptomatic right carotid disease with right M4 calcified plaque CT head old infarct in the right caudate nucleus. CTA head & neck - Nonocclusive calcified thrombus along an anterior M4 branch of the right MCA within the right Sylvian fissure. Densely calcified plaque results in approximately 60% stenosis of the proximal right cervical ICA. No other hemodynamically significant stenosis or large vessel occlusion in the head or neck. MRI no acute intracranial abnormality, chronic small vessel ischemia and volume loss Carotid Doppler unremarkable  2D Echo EF 55-60% LDL 66 HgbA1c 6.1 VTE prophylaxis - Eliquis aspirin 81 mg daily and Eliquis (apixaban) daily prior to admission, now on aspirin 81 mg daily, clopidogrel 75 mg daily, and Eliquis (apixaban) daily. Will stop eliquis 2 days prior to the TCAR procedure. Therapy recommendations:  No follow up needed  Disposition:  Home today  Carotid stenosis CTA head & neck - Nonocclusive calcified thrombus along an anterior M4 branch of the right MCA within the right Sylvian fissure. Densely calcified plaque results in approximately 60% stenosis of the proximal right cervical ICA.  Carotid Doppler unremarkable  Vascular surgery consult Discussed with Dr. Karin Lieu, given the amaurosis fugax, right M4 calcified plaque, and right ICA calcified plaque, felt reasonable to offer intervention at this time Plan for intervention Monday 10/7  Will continue triple therapy and stop eliquis 2 days prior to TCAR per vascular surgery recommendations  PAF On eliquis PTA Continue eliquis Stop 2 days prior to the TCAR per [facility]VS  R hand resting tremor No other feature of PD at this time Mild R hand resting tremor Will follow up with [facility] to evaluate  Hypertension Home meds: Amlodipine, furosemide Stable Blood pressure goal normotension  Hyperlipidemia Home meds:  Atorvastatin 80mg , resumed in  hospital LDL 66, goal < 70 Continue statin at discharge  Other Stroke Risk Factors ETOH use, alcohol level <10, advised to drink no more than 2 drink(s) a day Obesity, Body mass index is 25.1 kg/m., BMI >/= 30 associated with increased stroke risk, recommend weight loss, diet and exercise as appropriate  CAD Advanced age Severe MR s/p mTEER  Other Active Problems Right hand resting tremor. Plan for outpatient follow up with neurology.  CKD III b. Cre 1.90  Hospital day # 0  Patient seen and examined by NP/APP with MD. MD to update note as needed.   Elmer Picker, DNP, FNP-BC [facility] Pager: [phone]  ATTENDING NOTE: I reviewed above note and agree with the assessment and plan. Pt was seen and examined.   No family at the bedside. Pt sitting at the edge of bed. He stated that he woke up this morning and tried to look at his cell phone and then found out right eye painless vision loss, last 4-5 min and resolved. CTA found right M4 calcified plaque and right ICA calcified plaque, which likely to explain the amaurosis fugax and right M4 lesion. Pt neuro intact now, discussed with [facility],  plan for right TCAR on 10/7. Put on triple therapy but will stop eliquis 2 days before the procedure. Continue statin. PT and OT no recs.   For detailed assessment and plan, please refer to above/below as I have made changes wherever appropriate.   Neurology will sign off. Please call with questions. Pt will follow up at [facility] in about 4 weeks. Thanks for the consult.   Marvel Plan, MD PhD Stroke Neurology 04/30/2023 6:30 PM    To contact Stroke Continuity provider, please refer to [email or web address]. After hours, contact General Neurology

## 2023-04-30 NOTE — Progress Notes (Signed)
NEW ADMISSION NOTE   Arrival Method: ED stretcher Mental Orientation: AAOx4 Telemetry: 5M06 Assessment: Completed Skin: See flowsheet IV: LAC Pain: 0/10 Tubes: n/a Safety Measures: Safety Fall Prevention Plan has been given, discussed and signed Admission: Completed [facility] Orientation: Patient has been orientated to the room, unit and staff.  Family: none at bedside   Orders have been reviewed and implemented. Will continue to monitor the patient. Call light has been placed within reach and bed alarm has been activated.

## 2023-04-30 NOTE — Consult Note (Signed)
NEUROLOGY CONSULTATION NOTE   Date of service: April 30, 2023 Patient Name: [name] MRN:  [id number] DOB:  Aug 05, 1935 Reason for consult: "Episode of R eye vision loss" Requesting Provider: Zannie Cove, MD _ _ _   _ __   _ __ _ _  __ __   _ __   __ _  History of Present Illness  [name] is a 87 y.o. male with PMH significant for CAD, nephrolithiasis, hypothyroidism, Afibb on eliquis who woke up to a phone call from his daughter yesterday AM at 0830 and noticed vision from R eye was blurred and stayed that way for about 10 mins and then resolved. He remembers getting up to use the bathroom at 2AM and was fine at that time. Went to bed and in the afternoon, noticed that his BL arms were numb and tingly and tight like he had BP cuff around his arms. This was on both his arms and then resolved. He reports that he has been slow more recently. IF he leans forward to get something from the floor, he falls and tips over.  He came to the ED for further evaluation and workup. Case was discussed with Dr. Viviann Spare and felt concerning for potential TIA.  LKW: 0200 mRS: 1 tNKASE: not offered, on eliquis and no deficits upon arrival to the ED. Thrombectomy: not offered, low suspicion for LVO NIHSS components Score: Comment  1a Level of Conscious 0[x]  1[]  2[]  3[]      1b LOC Questions 0[x]  1[]  2[]       1c LOC Commands 0[x]  1[]  2[]       2 Best Gaze 0[x]  1[]  2[]       3 Visual 0[x]  1[]  2[]  3[]      4 Facial Palsy 0[x]  1[]  2[]  3[]      5a Motor Arm - left 0[x]  1[]  2[]  3[]  4[]  UN[]    5b Motor Arm - Right 0[x]  1[]  2[]  3[]  4[]  UN[]    6a Motor Leg - Left 0[x]  1[]  2[]  3[]  4[]  UN[]    6b Motor Leg - Right 0[x]  1[]  2[]  3[]  4[]  UN[]    7 Limb Ataxia 0[x]  1[]  2[]  3[]  UN[]     8 Sensory 0[x]  1[]  2[]  UN[]      9 Best Language 0[x]  1[]  2[]  3[]      10 Dysarthria 0[x]  1[]  2[]  UN[]      11 Extinct. and Inattention 0[x]  1[]  2[]       TOTAL: 0       ROS   Constitutional Denies weight loss, fever and  chills.   HEENT Denies changes in vision and hearing.   Respiratory Denies SOB and cough.   CV Denies palpitations and CP   GI Denies abdominal pain, nausea, vomiting and diarrhea.   GU Denies dysuria and urinary frequency.   MSK Denies myalgia and joint pain.   Skin Denies rash and pruritus.   Neurological Denies headache and syncope.   Psychiatric Denies recent changes in mood. Denies anxiety and depression.    Past History   Past Medical History:  Diagnosis Date   Arthritis    knees,back   CAD (coronary artery disease) CARDIOLOGIST-  DR HOCHREIN   previous myocardial infarction in 2001 with 90% circumflex lesion treated with PTCA. The LAD had 70-80% stenosis which was treawted with angioplasty. Most recent catheterization in 2003 demonstrated the LAD at 30% mid stenosis, circumflex 60-70% stenosis in the mid AV grove, and 40% stenosis in the previously stented area. The right coronary artery had 30-40% stenosis  in the mid portion.    GERD (gastroesophageal reflux disease)    History of kidney stones    Hypertension    Hypothyroidism    Left ureteral stone    Pre-diabetes    S/P mitral valve clip implantation 03/25/2022   s/p TEER wtih two XTW MitraClips by Dr. Excell Seltzer and Lynnette Caffey   Severe mitral regurgitation    Skin cancer    face   Past Surgical History:  Procedure Laterality Date   APPENDECTOMY  as teen   BUBBLE STUDY  12/11/2021   Procedure: BUBBLE STUDY;  Surgeon: Parke Poisson, MD;  Location: [facility] ENDOSCOPY;  Service: Cardiology;;   CARDIAC CATHETERIZATION  03-29-2000   dr hochrein   Nonobstuctive CAD /   CFX patent stent and LAD diffuse luminal irregularities including mid segment previously been angioplastied   CARDIOVASCULAR STRESS TEST  05-09-2015   dr hochrein   low risk perfusion study/  no reversible ischemia/  normal LV function and wall motion , ef 63%   CARDIOVERSION N/A 04/16/2022   Procedure: CARDIOVERSION;  Surgeon: Sande Rives, MD;  Location:  [facility] ENDOSCOPY;  Service: Cardiovascular;  Laterality: N/A;   CATARACT EXTRACTION W/ INTRAOCULAR LENS  IMPLANT, BILATERAL  left 05-06-2015/  right 06-10-2015   CORONARY ANGIOPLASTY WITH STENT PLACEMENT  01-21-2000  dr hochrein   PTCA to pLAD/  PTCA and BMS x1 to mid LCFX/  normal LVSF   CORONARY ANGIOPLASTY WITH STENT PLACEMENT  01-10-2002  dr Chales Abrahams   PTCA and BMS to mid AV LCFX (in-stent restenosis)/  diffuse 30-40% mRCA ,  mLAD diffuse 30%,  normal LVSF, ef 65%   CYSTOSCOPY/RETROGRADE/URETEROSCOPY/STONE EXTRACTION WITH BASKET Left 05/23/2015   Procedure: CYSTOSCOPY, FULGERATION OF PROSTATE,  LEFT URETEROSCOPY, JJ STENT PLACEMENT;  Surgeon: Jerilee Field, MD;  Location: [facility];  Service: Urology;  Laterality: Left;   ELECTROPHYSIOLOGIC STUDY  03-30-2000   dr Graciela Husbands   EXTRACORPOREAL SHOCK WAVE LITHOTRIPSY Left 10-30-2012   EXTRACORPOREAL SHOCK WAVE LITHOTRIPSY Right 11/16/2021   Procedure: EXTRACORPOREAL SHOCK WAVE LITHOTRIPSY (ESWL);  Surgeon: Marcine Matar, MD;  Location: [facility];  Service: Urology;  Laterality: Right;   HOLMIUM LASER APPLICATION Left 05/23/2015   Procedure: WITH HOLMIUM LASER LITHOTRIPSY;  Surgeon: Jerilee Field, MD;  Location: [facility];  Service: Urology;  Laterality: Left;   KNEE ARTHROSCOPY Bilateral x2 right/  left 2015   LUMBAR LAMINECTOMY/DECOMPRESSION MICRODISCECTOMY N/A 09/28/2012   Procedure: L3  -L5 DECOMPRESSION L4 - L5 FUSION WITH INSTRUMENTATION 2 LEVELS;  Surgeon: Venita Lick, MD;  Location: [facility] OR;  Service: Orthopedics;  Laterality: N/A;   MITRAL VALVE REPAIR N/A 03/25/2022   Procedure: MITRAL VALVE REPAIR;  Surgeon: Orbie Pyo, MD;  Location: [facility] INVASIVE CV LAB;  Service: Cardiovascular;  Laterality: N/A;   RIGHT/LEFT HEART CATH AND CORONARY ANGIOGRAPHY N/A 01/27/2022   Procedure: RIGHT/LEFT HEART CATH AND CORONARY ANGIOGRAPHY;  Surgeon: Orbie Pyo, MD;  Location: [facility] INVASIVE CV LAB;   Service: Cardiovascular;  Laterality: N/A;   TEE WITHOUT CARDIOVERSION N/A 12/11/2021   Procedure: TRANSESOPHAGEAL ECHOCARDIOGRAM (TEE);  Surgeon: Parke Poisson, MD;  Location: [facility] ENDOSCOPY;  Service: Cardiology;  Laterality: N/A;   TEE WITHOUT CARDIOVERSION N/A 03/25/2022   Procedure: TRANSESOPHAGEAL ECHOCARDIOGRAM (TEE);  Surgeon: Orbie Pyo, MD;  Location: [facility] INVASIVE CV LAB;  Service: Cardiovascular;  Laterality: N/A;   TONSILLECTOMY  as child   TOTAL KNEE ARTHROPLASTY Left 10/02/2018   Procedure: TOTAL LEFT  KNEE ARTHROPLASTY;  Surgeon: Charlann Boxer,  Molli Hazard, MD;  Location: [facility] ORS;  Service: Orthopedics;  Laterality: Left;  90 mins- Ok per Tiffany   TOTAL SHOULDER ARTHROPLASTY Right 01/14/2011   TRANSTHORACIC ECHOCARDIOGRAM  05-21-2015   mild LVH,  ef 55-605,  grade 2 diastolic dysfunction, basal inferior hyperkinesis/  moderate AV calcification sclerosis without stenosis/  trivial MR/  moderate LAE/  mild TR/  mild RAE   Family History  Problem Relation Age of Onset   Heart failure Mother    Prostate cancer Father    Social History   Socioeconomic History   Marital status: Married    Spouse name: Not on file   Number of children: 2   Years of education: Not on file   Highest education level: Not on file  Occupational History   Occupation: retired  Tobacco Use   Smoking status: Former    Current packs/day: 0.00    Average packs/day: 1 pack/day for 3.0 years (3.0 ttl pk-yrs)    Types: Cigarettes    Start date: 09/27/1959    Quit date: 09/26/1962    Years since quitting: 60.6   Smokeless tobacco: Never  Vaping Use   Vaping status: Never Used  Substance and Sexual Activity   Alcohol use: No   Drug use: No   Sexual activity: Not Currently  Other Topics Concern   Not on file  Social History Narrative   Lives at home with wife.  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     Social Determinants of Health   Financial Resource Strain: Not on file  Food Insecurity: No Food Insecurity (04/29/2023)    Hunger Vital Sign    Worried About Running Out of Food in the Last Year: Never true    Ran Out of Food in the Last Year: Never true  Transportation Needs: No Transportation Needs (04/29/2023)   PRAPARE - Administrator, Civil Service (Medical): No    Lack of Transportation (Non-Medical): No  Physical Activity: Not on file  Stress: Not on file  Social Connections: Not on file   No Known Allergies  Medications   Medications Prior to Admission  Medication Sig Dispense Refill Last Dose   acetaminophen (TYLENOL) 500 MG tablet Take 500 mg by mouth every 6 (six) hours as needed for moderate pain.   04/29/2023   amiodarone (PACERONE) 200 MG tablet Take 1 tablet (200 mg total) by mouth daily. 90 tablet 3 04/29/2023   amLODipine (NORVASC) 2.5 MG tablet Take 1 tablet (2.5 mg total) by mouth daily. 30 tablet 3 04/29/2023   apixaban (ELIQUIS) 5 MG TABS tablet Take 1/2 tablet by mouth twice daily 90 tablet 1 04/29/2023 at 0900   atorvastatin (LIPITOR) 80 MG tablet Take 1 tablet (80 mg total) by mouth at bedtime. 90 tablet 3 04/28/2023   Cholecalciferol (VITAMIN D) 50 MCG (2000 UT) tablet Take 2,000 Units by mouth daily.   04/29/2023   Cyanocobalamin (B-12) 5000 MCG CAPS Take 5,000 mcg by mouth daily.   04/29/2023   dapagliflozin propanediol (FARXIGA) 10 MG TABS tablet Take 1 tablet (10 mg total) by mouth daily before breakfast. 90 tablet 3 04/29/2023   ferrous sulfate 325 (65 FE) MG tablet Take 325 mg by mouth daily with breakfast.   04/29/2023   finasteride (PROSCAR) 5 MG tablet Take 5 mg by mouth daily.   04/29/2023   folic acid (FOLVITE) 800 MCG tablet Take 800 mcg by mouth daily.    04/29/2023   furosemide (LASIX) 20 MG tablet Take 1 tablet (20  mg total) by mouth every other day. 60 tablet 2 04/29/2023   levothyroxine (SYNTHROID) 25 MCG tablet Take 50 mcg by mouth daily before breakfast.   04/29/2023   meclizine (ANTIVERT) 25 MG tablet Take 25 mg by mouth daily as needed for dizziness.    04/28/2023   niacin 500 MG tablet Take 500 mg by mouth daily.   04/28/2023   nitroGLYCERIN (NITROSTAT) 0.4 MG SL tablet Place 1 tablet (0.4 mg total) under the tongue every 5 (five) minutes as needed for chest pain. 25 tablet 3 Unk   pantoprazole (PROTONIX) 40 MG tablet Take 1 tablet (40 mg total) by mouth daily. 90 tablet 2 04/29/2023     Vitals   Vitals:   04/29/23 2015 04/29/23 2030 04/29/23 2106 04/30/23 0321  BP: (!) 142/77 118/80 (!) 147/98 137/67  Pulse: 64 (!) 53 (!) 55 67  Resp:  16 18 18   Temp:   (!) 97.5 F (36.4 C) 98.1 F (36.7 C)  TempSrc:   Oral Oral  SpO2: 92% 100% 98% 94%  Weight:      Height:         Body mass index is 25.1 kg/m.  Physical Exam   General: Laying comfortably in bed; in no acute distress.  HENT: Normal oropharynx and mucosa. Normal external appearance of ears and nose.  Neck: Supple, no pain or tenderness  CV: No JVD. No peripheral edema.  Pulmonary: Symmetric Chest rise. Normal respiratory effort.  Abdomen: Soft to touch, non-tender.  Ext: No cyanosis, edema, or deformity  Skin: No rash. Normal palpation of skin.   Musculoskeletal: Normal digits and nails by inspection. No clubbing.  Neurologic Examination  Mental status/Cognition: Alert, oriented to self, place, month and year, good attention.  Speech/language: Fluent, comprehension intact, object naming intact, repetition intact.  Cranial nerves:   CN II Pupils equal and reactive to light, no VF deficits    CN III,IV,VI EOM intact, no gaze preference or deviation, no nystagmus    CN V normal sensation in V1, V2, and V3 segments bilaterally    CN VII no asymmetry, no nasolabial fold flattening    CN VIII normal hearing to speech    CN IX & X normal palatal elevation, no uvular deviation   CN XI 5/5 head turn and 5/5 shoulder shrug bilaterally    CN XII midline tongue protrusion    Motor:  Muscle bulk: normal, tone normal, pronator drift none tremor: ?parkinsonian tremor. Mvmt Root  Nerve  Muscle Right Left Comments  SA C5/6 Ax Deltoid 5 5   EF C5/6 Mc Biceps 5 5   EE C6/7/8 Rad Triceps 5 5   WF C6/7 Med FCR     WE C7/8 PIN ECU     F Ab C8/T1 U ADM/FDI 5 5   HF L1/2/3 Fem Illopsoas 5 5   KE L2/3/4 Fem Quad 5 5   DF L4/5 D Peron Tib Ant 5 5   PF S1/2 Tibial Grc/Sol 5 5    Sensation:  Light touch Intact throughout   Pin prick    Temperature    Vibration   Proprioception    Coordination/Complex Motor:  - Finger to Nose intact BL - Heel to shin intact BL - Rapid alternating movement are slowed BL - Gait: deferred for patient safety.  Labs   CBC:  Recent Labs  Lab 04/29/23 1427 04/29/23 1432  WBC 6.3  --   NEUTROABS 4.3  --   HGB 14.4 15.0  HCT  45.2 44.0  MCV 99.8  --   PLT 141*  --     Basic Metabolic Panel:  Lab Results  Component Value Date   NA 140 04/29/2023   K 4.5 04/29/2023   CO2 27 04/29/2023   GLUCOSE 109 (H) 04/29/2023   BUN 28 (H) 04/29/2023   CREATININE 1.90 (H) 04/29/2023   CALCIUM 9.0 04/29/2023   GFRNONAA 34 (L) 04/29/2023   GFRAA 54 (L) 10/03/2018   Lipid Panel:  Lab Results  Component Value Date   LDLCALC 71 03/07/2023   HgbA1c:  Lab Results  Component Value Date   HGBA1C 6.1 (H) 04/29/2023   Urine Drug Screen: No results found for: "LABOPIA", "COCAINSCRNUR", "LABBENZ", "AMPHETMU", "THCU", "LABBARB"  Alcohol Level     Component Value Date/Time   ETH <10 04/29/2023 1427   INR  Lab Results  Component Value Date   INR 1.1 04/29/2023   APTT  Lab Results  Component Value Date   APTT 31 04/29/2023     CT Head without contrast(Personally reviewed): CTH was negative for a large hypodensity concerning for a large territory infarct or hyperdensity concerning for an ICH  CT angio Head and Neck with contrast(Personally reviewed): No LVO  MRI Brain(Personally reviewed): No acute stroke  Impression   TAL MCCREEDY is a 87 y.o. male with PMH significant for CAD, nephrolithiasis, hypothyroidism, Afibb on  eliquis who had a 10 mins episode of R eye blurred vision in AM and a separate episode of BL arms and finger tingling in the afternoon for 20 mins. Unlikely for the BL arm tingling to be stroke. Would expect stroke to cause unilateral deficit. The episode in AM, could however, have been amorousis fugax. However, he is on anticoagulation and therefore maximal medical therapy for stroke. With his advanced age, not a candidate for antiplatelet.  Of note, he does have some parkinsonian features including parkinsonian tremor, mild cogwheeling and bradykniesia. Does endorse to feeling frozen. Symptoms are mild at this time but he would benefit from following up with neurology outpatient.  Recommendations  - follow up with neurology outpatient for noted parkinsonian features. - orthostatic vitals. - wonder if with worsening creatinin from baseline, if the BL arm tingling/cramps could be explained by electrolyte abnormalities. Will check Magnesium and phorphorus, CMP is non revealing. - we will be available on an as needed basis and will not be actively following. ______________________________________________________________________   Thank you for the opportunity to take part in the care of this patient. If you have any further questions, please contact the neurology consultation attending.  Signed,  Erick Blinks [facility] _ _ _   _ __   _ __ _ _  __ __   _ __   __ _

## 2023-05-02 ENCOUNTER — Other Ambulatory Visit: Payer: Self-pay

## 2023-05-02 DIAGNOSIS — I6521 Occlusion and stenosis of right carotid artery: Secondary | ICD-10-CM

## 2023-05-03 NOTE — Progress Notes (Signed)
PCP: [name], MD Cardiology: Dr. Antoine Poche HF Cardiology: Dr. Shirlee Latch  87 y.o. with history of persistent atrial fibrillation and mitral regurgitation was referred by Dr. Lynnette Caffey for CHF evaluation prior to Mitraclip.  Patient has history of CAD with MI and POBA to LCx and LAD in 2001 as well as BMS to LCx in 2003.  He reported exertional dyspnea beginning last year that he attributed to the after-effects of COVID-19 x 2 episodes.  He saw Dr. Antoine Poche in 4/23 as a pre-op for lithotripsy and was noted to be in atrial fibrillation (first time that AF had been recorded).  Echo was then done, showing preserved LV EF but severe MR.  TEE in 5/23 showed EF 60-65%, mildly decreased RV systolic function, severe LAE, mild bileaflet myxomatous MV prolapse with severe MR (central).  Cath in 6/23 showed mild nonobstructive coronary disease.    Patient had Mitraclip x 2 in 9/23 with followup echo showing EF 50-55%, normal RV, 2 Mitraclips with moderate MR, mean gradient 5, mild-moderate TR.   Patient had DCCV back to NSR in 9/23.    Echo in 3/24 showed EF 55-60%, severe LAE, Mitraclip x 2 with mild-moderate MR and mean gradient 3 mmHg, IVC normal.   Patient returns for followup of diastolic CHF and atrial fibrillation. He remains in NSR today.  No palpitations.  Minimal exertional dyspnea, main limitor is knee pain.  He is able to mow his grass and garden without significant shortness of breath.  No chest pain.  No lightheadedness. Weight is up 2 lbs.   Labs (6/23): K 4, creatinine 1.42 Labs (9/23): K 4.3, creatinine 2 Labs (10/23): K 3.7, creatinine 1.95, normal LFTs and TSH. Labs (4/24): K 3.7, creatinine 1.83, BNP 356  ECG (personally reviewed): NSR, nonspecific T wave flattening  PMH: 1. CAD: MI in 2001 with POBA to CFX and LAD.  - LHC 2003 with BMS to LCx. - LHC (6/23): minimal CAD, patent LCx stent.  2. Mitral regurgitation: TEE (5/23) with EF 60-65%, mildly decreased RV systolic function,  severe LAE, mild bileaflet myxomatous MV prolapse with severe MR (central).  - RHC (5/23): mean 5, PA 38/10 mean 23, mean PCWP 17 with v waves to 34, CI 3.5. - Mitraclip x 2 in 8/23.  - Echo (9/23): EF 50-55%, normal RV, 2 Mitraclips with moderate MR, mean gradient 5, mild-moderate TR.  - Echo (3/24): EF 55-60%, severe LAE, Mitraclip x 2 with mild-moderate MR and mean gradient 3 mmHg, IVC normal.  3. Atrial fibrillation: Persistent since at least 4/23.  - DCCV 9/23 4. HTN 5. Hyperlipidemia 6. Hypothyroidism 7. COVID-19 x 2 8. Nephrolithiasis 9. CKD stage 3  Social History   Socioeconomic History   Marital status: Married    Spouse name: Not on file   Number of children: 2   Years of education: Not on file   Highest education level: Not on file  Occupational History   Occupation: retired  Tobacco Use   Smoking status: Former    Current packs/day: 0.00    Average packs/day: 1 pack/day for 3.0 years (3.0 ttl pk-yrs)    Types: Cigarettes    Start date: 09/27/1959    Quit date: 09/26/1962    Years since quitting: 60.6   Smokeless tobacco: Never  Vaping Use   Vaping status: Never Used  Substance and Sexual Activity   Alcohol use: No   Drug use: No   Sexual activity: Not Currently  Other Topics Concern   Not on  file  Social History Narrative   Lives at home with wife.  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     Social Determinants of Health   Financial Resource Strain: Not on file  Food Insecurity: No Food Insecurity (04/29/2023)   Hunger Vital Sign    Worried About Running Out of Food in the Last Year: Never true    Ran Out of Food in the Last Year: Never true  Transportation Needs: No Transportation Needs (04/29/2023)   PRAPARE - Administrator, Civil Service (Medical): No    Lack of Transportation (Non-Medical): No  Physical Activity: Not on file  Stress: Not on file  Social Connections: Not on file  Intimate Partner Violence: Not At Risk (04/29/2023)   Humiliation, Afraid,  Rape, and Kick questionnaire    Fear of Current or Ex-Partner: No    Emotionally Abused: No    Physically Abused: No    Sexually Abused: No   Family History  Problem Relation Age of Onset   Heart failure Mother    Prostate cancer Father    ROS: All systems reviewed and negative except as per HPI.   Current Outpatient Medications  Medication Sig Dispense Refill   acetaminophen (TYLENOL) 500 MG tablet Take 500 mg by mouth every 6 (six) hours as needed for moderate pain.     amiodarone (PACERONE) 200 MG tablet Take 1 tablet (200 mg total) by mouth daily. 90 tablet 3   amLODipine (NORVASC) 2.5 MG tablet Take 1 tablet (2.5 mg total) by mouth daily. 30 tablet 3   apixaban (ELIQUIS) 5 MG TABS tablet Take 1/2 tablet by mouth twice daily 90 tablet 1   aspirin EC 81 MG tablet Take 1 tablet (81 mg total) by mouth daily. Swallow whole.     atorvastatin (LIPITOR) 80 MG tablet Take 1 tablet (80 mg total) by mouth at bedtime. 90 tablet 3   Cholecalciferol (VITAMIN D) 50 MCG (2000 UT) tablet Take 2,000 Units by mouth daily.     clopidogrel (PLAVIX) 75 MG tablet Take 1 tablet (75 mg total) by mouth daily. 30 tablet 3   Cyanocobalamin (B-12) 5000 MCG CAPS Take 5,000 mcg by mouth daily.     dapagliflozin propanediol (FARXIGA) 10 MG TABS tablet Take 1 tablet (10 mg total) by mouth daily before breakfast. 90 tablet 3   ferrous sulfate 325 (65 FE) MG tablet Take 325 mg by mouth daily with breakfast.     finasteride (PROSCAR) 5 MG tablet Take 5 mg by mouth daily.     folic acid (FOLVITE) 800 MCG tablet Take 800 mcg by mouth daily.      furosemide (LASIX) 20 MG tablet Take 1 tablet (20 mg total) by mouth every other day. 60 tablet 2   levothyroxine (SYNTHROID) 25 MCG tablet Take 50 mcg by mouth daily before breakfast.     meclizine (ANTIVERT) 25 MG tablet Take 25 mg by mouth daily as needed for dizziness.     niacin 500 MG tablet Take 500 mg by mouth daily.     nitroGLYCERIN (NITROSTAT) 0.4 MG SL tablet  Place 1 tablet (0.4 mg total) under the tongue every 5 (five) minutes as needed for chest pain. 25 tablet 3   pantoprazole (PROTONIX) 40 MG tablet Take 1 tablet (40 mg total) by mouth daily. 90 tablet 2   No current facility-administered medications for this visit.   There were no vitals taken for this visit. General: NAD Neck: No JVD, no thyromegaly or thyroid nodule.  Lungs: Clear to auscultation bilaterally with normal respiratory effort. CV: Nondisplaced PMI.  Heart regular S1/S2, no S3/S4, no murmur.  No peripheral edema.  No carotid bruit.  Normal pedal pulses.  Abdomen: Soft, nontender, no hepatosplenomegaly, no distention.  Skin: Intact without lesions or rashes.  Neurologic: Alert and oriented x 3.  Psych: Normal affect. Extremities: No clubbing or cyanosis.  HEENT: Normal.   Assessment/Plan: 1. Mitral regurgitation: Suspect mixed picture of primary and functional MR, with myxomatous MV prolapse as well as a component of atrial functional MR with central jet and enlarged left atrium. S/p mTEER in 8/23.  Post-mTEER echo in 9/23 showed MV repair with 2 Mitraclips with moderate MR and mean gradient 5 mmHg.  Echo in 3/24 showed mild-moderate MR s/p Mitraclip with mean gradient 3 mmHg.  2. Atrial fibrillation: S/p DCCV in 9/23. NSR today.  - Continue amiodarone 200 mg daily.  Check LFTs and TSH, he will need regular eye exam.    - Continue apixaban. CBC today.  3. Chronic diastolic CHF: Echo in 3/24 showed EF 55-60%, severe LAE, Mitraclip x 2 with mild-moderate MR and mean gradient 3 mmHg, IVC normal.  He is not volume overloaded on exam today.  NYHA class II symptoms. Improved post-Mitraclip.  - Continue Farxiga 10 mg daily.  - Continue Lasix 20 mg every other day. BMET/BNP today.   Follow up in 6 months.   Anderson [facility]  05/03/2023

## 2023-05-04 ENCOUNTER — Encounter (HOSPITAL_COMMUNITY): Payer: Self-pay

## 2023-05-04 ENCOUNTER — Ambulatory Visit (HOSPITAL_COMMUNITY)
Admission: RE | Admit: 2023-05-04 | Discharge: 2023-05-04 | Disposition: A | Discharge status: Home / Self Care | Payer: Medicare HMO | Source: Ambulatory Visit | Attending: Family Medicine | Admitting: Family Medicine

## 2023-05-04 VITALS — BP 154/82 | HR 64 | Wt 167.6 lb

## 2023-05-04 DIAGNOSIS — I252 Old myocardial infarction: Secondary | ICD-10-CM | POA: Diagnosis not present

## 2023-05-04 DIAGNOSIS — Z9889 Other specified postprocedural states: Secondary | ICD-10-CM | POA: Diagnosis not present

## 2023-05-04 DIAGNOSIS — I5032 Chronic diastolic (congestive) heart failure: Secondary | ICD-10-CM

## 2023-05-04 DIAGNOSIS — I2510 Atherosclerotic heart disease of native coronary artery without angina pectoris: Secondary | ICD-10-CM | POA: Diagnosis not present

## 2023-05-04 DIAGNOSIS — Z7982 Long term (current) use of aspirin: Secondary | ICD-10-CM | POA: Insufficient documentation

## 2023-05-04 DIAGNOSIS — N183 Chronic kidney disease, stage 3 unspecified: Secondary | ICD-10-CM | POA: Insufficient documentation

## 2023-05-04 DIAGNOSIS — I4891 Unspecified atrial fibrillation: Secondary | ICD-10-CM

## 2023-05-04 DIAGNOSIS — Z95818 Presence of other cardiac implants and grafts: Secondary | ICD-10-CM | POA: Diagnosis not present

## 2023-05-04 DIAGNOSIS — Z79899 Other long term (current) drug therapy: Secondary | ICD-10-CM | POA: Insufficient documentation

## 2023-05-04 DIAGNOSIS — I130 Hypertensive heart and chronic kidney disease with heart failure and stage 1 through stage 4 chronic kidney disease, or unspecified chronic kidney disease: Secondary | ICD-10-CM | POA: Diagnosis not present

## 2023-05-04 DIAGNOSIS — I340 Nonrheumatic mitral (valve) insufficiency: Secondary | ICD-10-CM | POA: Diagnosis not present

## 2023-05-04 DIAGNOSIS — Z7902 Long term (current) use of antithrombotics/antiplatelets: Secondary | ICD-10-CM | POA: Insufficient documentation

## 2023-05-04 DIAGNOSIS — I4819 Other persistent atrial fibrillation: Secondary | ICD-10-CM | POA: Diagnosis not present

## 2023-05-04 DIAGNOSIS — Z7901 Long term (current) use of anticoagulants: Secondary | ICD-10-CM | POA: Diagnosis not present

## 2023-05-04 DIAGNOSIS — I10 Essential (primary) hypertension: Secondary | ICD-10-CM

## 2023-05-04 DIAGNOSIS — I6521 Occlusion and stenosis of right carotid artery: Secondary | ICD-10-CM | POA: Diagnosis not present

## 2023-05-04 LAB — BASIC METABOLIC PANEL
Anion gap: 9 (ref 5–15)
BUN: 27 mg/dL — ABNORMAL HIGH (ref 8–23)
CO2: 26 mmol/L (ref 22–32)
Calcium: 9.4 mg/dL (ref 8.9–10.3)
Chloride: 105 mmol/L (ref 98–111)
Creatinine, Ser: 1.94 mg/dL — ABNORMAL HIGH (ref 0.61–1.24)
GFR, Estimated: 33 mL/min — ABNORMAL LOW (ref >60)
Glucose, Bld: 114 mg/dL — ABNORMAL HIGH (ref 70–99)
Potassium: 4.2 mmol/L (ref 3.5–5.1)
Sodium: 140 mmol/L (ref 135–145)

## 2023-05-04 LAB — CBC
HCT: 45.4 % (ref 39.0–52.0)
Hemoglobin: 14.6 g/dL (ref 13.0–17.0)
MCH: 31.5 pg (ref 26.0–34.0)
MCHC: 32.2 g/dL (ref 30.0–36.0)
MCV: 98.1 fL (ref 80.0–100.0)
Platelets: 152 10*3/uL (ref 150–400)
RBC: 4.63 MIL/uL (ref 4.22–5.81)
RDW: 14.4 % (ref 11.5–15.5)
WBC: 8.4 10*3/uL (ref 4.0–10.5)
nRBC: 0 % (ref 0.0–0.2)

## 2023-05-04 LAB — TSH: TSH: 5.474 u[IU]/mL — ABNORMAL HIGH (ref 0.350–4.500)

## 2023-05-04 NOTE — Patient Instructions (Addendum)
Medication Changes:  No Changes In Medications at this time.   Lab Work:  Labs done today, your results will be available in MyChart, we will contact you for abnormal readings.  Special Instructions // Education:  PLEASE LET OUR OFFICE KNOW IF YOUR BLOOD PRESSURE IS CONSISTENTLY RUNNING HIGHER THAN 150 SYSTOLIC (TOP NUMBER) NUMBER IS [phone] OPTION 2   Follow-Up in: 4 MONTHS. PLEASE CALL OUR OFFICE AROUND DECEMBER TO GET SCHEDULED FOR YOUR APPOINTMENT. PHONE NUMBER IS [phone] OPTION 2    At the [facility], you and your health needs are our priority. We have a designated team specialized in the treatment of Heart Failure. This Care Team includes your primary Heart Failure Specialized Cardiologist (physician), Advanced Practice Providers (APPs- Physician Assistants and Nurse Practitioners), and Pharmacist who all work together to provide you with the care you need, when you need it.   You may see any of the following providers on your designated Care Team at your next follow up:  Dr. Arvilla Meres Dr. Marca Ancona Dr. Dorthula Nettles Dr. Theresia Bough Tonye Becket, NP Robbie Lis, [place] [facility], [place] Brynda Peon, NP [place] Lee, NP Karle Plumber, PharmD   Please be sure to bring in all your medications bottles to every appointment.   Need to Contact us:  If you have any questions or concerns before your next appointment please send [place] a message through [place] or call our office at [phone].    TO LEAVE A MESSAGE FOR THE NURSE SELECT OPTION 2, PLEASE LEAVE A MESSAGE INCLUDING: YOUR NAME DATE OF BIRTH CALL BACK NUMBER REASON FOR CALL**this is important as we prioritize the call backs  YOU WILL RECEIVE A CALL BACK THE SAME DAY AS LONG AS YOU CALL BEFORE 4:00 PM

## 2023-05-05 NOTE — Pre-Procedure Instructions (Signed)
Surgical Instructions   Your procedure is scheduled on Monday, October 7th. Report to [facility] Main Entrance "A" at 07:30 A.M., then check in with the Admitting office. Any questions or running late day of surgery: call [phone]  Questions prior to your surgery date: call [phone], Monday-Friday, 8am-4pm. If you experience any cold or flu symptoms such as cough, fever, chills, shortness of breath, etc. between now and your scheduled surgery, please notify us at the above number.     Remember:  Do not eat or drink after midnight the night before your surgery    Take these medicines the morning of surgery with A SIP OF WATER  amiodarone (PACERONE)  amLODipine (NORVASC)  aspirin EC  clopidogrel (PLAVIX)  finasteride (PROSCAR)  levothyroxine (SYNTHROID)  pantoprazole (PROTONIX)    May take these medicines IF NEEDED: acetaminophen (TYLENOL)  meclizine (ANTIVERT)  nitroGLYCERIN (NITROSTAT)- if you have to take this medication prior to surgery, please call one of the above phone numbers and report this to a Nurse.    Follow your surgeon's instructions on when to stop apixaban (ELIQUIS).  If no instructions were given by your surgeon then you will need to call the office to get those instructions.     One week prior to surgery, STOP taking any Aspirin (unless otherwise instructed by your surgeon) Aleve, Naproxen, Ibuprofen, Motrin, Advil, Goody's, BC's, all herbal medications, fish oil, and non-prescription vitamins.   Hold dapagliflozin propanediol (FARXIGA) 72 hours prior to surgery. Last dose 10/3.               Do NOT Smoke (Tobacco/Vaping) for 24 hours prior to your procedure.  If you use a CPAP at night, you may bring your mask/headgear for your overnight stay.   You will be asked to remove any contacts, glasses, piercing's, hearing aid's, dentures/partials prior to surgery. Please bring cases for these items if needed.    Patients discharged the day of surgery  will not be allowed to drive home, and someone needs to stay with them for 24 hours.  SURGICAL WAITING ROOM VISITATION Patients may have no more than 2 support people in the waiting area - these visitors may rotate.   Pre-op nurse will coordinate an appropriate time for 1 ADULT support person, who may not rotate, to accompany patient in pre-op.  Children under the age of 27 must have an adult with them who is not the patient and must remain in the main waiting area with an adult.  If the patient needs to stay at the hospital during part of their recovery, the visitor guidelines for inpatient rooms apply.  Please refer to the [facility] website for the visitor guidelines for any additional information.   If you received a COVID test during your pre-op visit  it is requested that you wear a mask when out in public, stay away from anyone that may not be feeling well and notify your surgeon if you develop symptoms. If you have been in contact with anyone that has tested positive in the last 10 days please notify you surgeon.      Pre-operative CHG Bathing Instructions   You can play a key role in reducing the risk of infection after surgery. Your skin needs to be as free of germs as possible. You can reduce the number of germs on your skin by washing with CHG (chlorhexidine gluconate) soap before surgery. CHG is an antiseptic soap that kills germs and continues to kill germs even after washing.  DO NOT use if you have an allergy to chlorhexidine/CHG or antibacterial soaps. If your skin becomes reddened or irritated, stop using the CHG and notify one of our RNs at [phone].              TAKE A SHOWER THE NIGHT BEFORE SURGERY AND THE DAY OF SURGERY    Please keep in mind the following:  DO NOT shave, including legs and underarms, 48 hours prior to surgery.   You may shave your face before/day of surgery.  Place clean sheets on your bed the night before surgery Use a clean washcloth (not  used since being washed) for each shower. DO NOT sleep with pet's night before surgery.  CHG Shower Instructions:  Wash your face and private area with normal soap. If you choose to wash your hair, wash first with your normal shampoo.  After you use shampoo/soap, rinse your hair and body thoroughly to remove shampoo/soap residue.  Turn the water OFF and apply half the bottle of CHG soap to a CLEAN washcloth.  Apply CHG soap ONLY FROM YOUR NECK DOWN TO YOUR TOES (washing for 3-5 minutes)  DO NOT use CHG soap on face, private areas, open wounds, or sores.  Pay special attention to the area where your surgery is being performed.  If you are having back surgery, having someone wash your back for you may be helpful. Wait 2 minutes after CHG soap is applied, then you may rinse off the CHG soap.  Pat dry with a clean towel  Put on clean pajamas    Additional instructions for the day of surgery: DO NOT APPLY any lotions, deodorants, cologne, or perfumes.   Do not wear jewelry or makeup Do not wear nail polish, gel polish, artificial nails, or any other type of covering on natural nails (fingers and toes) Do not bring valuables to the hospital. [facility] is not responsible for valuables/personal belongings. Put on clean/comfortable clothes.  Please brush your teeth.  Ask your nurse before applying any prescription medications to the skin.

## 2023-05-06 ENCOUNTER — Other Ambulatory Visit: Payer: Self-pay

## 2023-05-06 ENCOUNTER — Encounter (HOSPITAL_COMMUNITY): Payer: Self-pay

## 2023-05-06 ENCOUNTER — Encounter (HOSPITAL_COMMUNITY)
Admission: RE | Admit: 2023-05-06 | Discharge: 2023-05-06 | Disposition: A | Discharge status: Home / Self Care | Payer: Medicare HMO | Source: Ambulatory Visit | Attending: Vascular Surgery | Admitting: Vascular Surgery

## 2023-05-06 VITALS — BP 135/66 | HR 60 | Temp 97.9°F | Resp 18 | Ht 69.0 in | Wt 168.4 lb

## 2023-05-06 DIAGNOSIS — I6521 Occlusion and stenosis of right carotid artery: Secondary | ICD-10-CM

## 2023-05-06 DIAGNOSIS — Z01812 Encounter for preprocedural laboratory examination: Secondary | ICD-10-CM | POA: Insufficient documentation

## 2023-05-06 DIAGNOSIS — I130 Hypertensive heart and chronic kidney disease with heart failure and stage 1 through stage 4 chronic kidney disease, or unspecified chronic kidney disease: Secondary | ICD-10-CM | POA: Insufficient documentation

## 2023-05-06 DIAGNOSIS — Z01818 Encounter for other preprocedural examination: Secondary | ICD-10-CM

## 2023-05-06 DIAGNOSIS — I5032 Chronic diastolic (congestive) heart failure: Secondary | ICD-10-CM | POA: Insufficient documentation

## 2023-05-06 HISTORY — DX: Chronic diastolic (congestive) heart failure: I50.32

## 2023-05-06 HISTORY — DX: Unspecified atrial fibrillation: I48.91

## 2023-05-06 HISTORY — DX: Chronic kidney disease, stage 3 unspecified: N18.30

## 2023-05-06 LAB — URINALYSIS, ROUTINE W REFLEX MICROSCOPIC
Bilirubin Urine: NEGATIVE
Glucose, UA: >=500 mg/dL — AB
Hgb urine dipstick: NEGATIVE
Ketones, ur: NEGATIVE mg/dL
Leukocytes,Ua: NEGATIVE
Nitrite: NEGATIVE
Protein, ur: NEGATIVE mg/dL
Specific Gravity, Urine: 1.007 (ref 1.005–1.030)
pH: 5 (ref 5.0–8.0)

## 2023-05-06 LAB — TYPE AND SCREEN
ABO/RH(D): A POS
Antibody Screen: NEGATIVE

## 2023-05-06 LAB — COMPREHENSIVE METABOLIC PANEL
ALT: 23 U/L (ref 0–44)
AST: 23 U/L (ref 15–41)
Albumin: 3.7 g/dL (ref 3.5–5.0)
Alkaline Phosphatase: 65 U/L (ref 38–126)
Anion gap: 7 (ref 5–15)
BUN: 24 mg/dL — ABNORMAL HIGH (ref 8–23)
CO2: 27 mmol/L (ref 22–32)
Calcium: 9.1 mg/dL (ref 8.9–10.3)
Chloride: 105 mmol/L (ref 98–111)
Creatinine, Ser: 1.81 mg/dL — ABNORMAL HIGH (ref 0.61–1.24)
GFR, Estimated: 36 mL/min — ABNORMAL LOW (ref >60)
Glucose, Bld: 92 mg/dL (ref 70–99)
Potassium: 4.3 mmol/L (ref 3.5–5.1)
Sodium: 139 mmol/L (ref 135–145)
Total Bilirubin: 1 mg/dL (ref 0.3–1.2)
Total Protein: 6.9 g/dL (ref 6.5–8.1)

## 2023-05-06 LAB — SURGICAL PCR SCREEN
MRSA, PCR: NEGATIVE
Staphylococcus aureus: NEGATIVE

## 2023-05-06 LAB — CBC
HCT: 44.2 % (ref 39.0–52.0)
Hemoglobin: 14 g/dL (ref 13.0–17.0)
MCH: 30.9 pg (ref 26.0–34.0)
MCHC: 31.7 g/dL (ref 30.0–36.0)
MCV: 97.6 fL (ref 80.0–100.0)
Platelets: 138 10*3/uL — ABNORMAL LOW (ref 150–400)
RBC: 4.53 MIL/uL (ref 4.22–5.81)
RDW: 14.2 % (ref 11.5–15.5)
WBC: 8.2 10*3/uL (ref 4.0–10.5)
nRBC: 0 % (ref 0.0–0.2)

## 2023-05-06 LAB — PROTIME-INR
INR: 1.1 (ref 0.8–1.2)
Prothrombin Time: 14 s (ref 11.4–15.2)

## 2023-05-06 LAB — APTT: aPTT: 30 s (ref 24–36)

## 2023-05-06 NOTE — Progress Notes (Signed)
Anesthesia Chart Review:  Case: [id number] Date/Time: 05/09/23 0915   Procedure: Right Transcarotid Artery Revascularization (Right)   Anesthesia type: Choice   Pre-op diagnosis: Symptomatic carotid artery stenosis   [facility] ROOM 16 / [facility] OR   Surgeons: Victorino Sparrow, MD       DISCUSSION: Patient is an 87 year old male scheduled for the above procedure.  History includes former smoker (quit 09/26/62), HTN, CAD (MI and POBA to LCx and LAD in 2001 as well as BMS to LCx in 2003), severe mitral regurgitation (s/p MV repair with MitraClip x2 03/25/22), afib (s/p DCCV 04/16/22), chronic diastolic CHF, CKD (stage 3b), prediabetes, hypothyroidism, CVA, GERD, skin cancer, spinal surgery.    admission 04/29/23-04/30/23 for transient right eye vision loss for up to 10 minutes. When he woke up he noticed transient numbness and tingling in his arms and presented to ED for further evaluation. CT/CTA of the head and neck showed an age-indeterminate infarct in the right caudate nucleus, nonocclusive calcified thrombus on the anterior M4 branch of the right MCA, and approximately 60% stenosis of the proximal right cervical ICA. Carotid Duplex showed < 40% ICA stenosis. Neurology and vascular surgery consulted and reviewed images. Per Dr. Gar Gibbon consult note, "We are in agreement that the lesion demonstrate 60% stenosis on CT and is felt to be the nidus for the amaurosis symptoms, especially as the patient was anticoagulated on Eliquis." Given increased risk for future stroke in an highly function and independent 37 year ol, revascularization options were discussed. Mr. Demartini elected to proceed with right TCAR.  He will require triple therapy for 30 days postoperatively.  Discharge medications included aspirin, Plavix, Eliquis with plans to hold Eliquis 2 days prior to surgery. Of note, neurology raised concerns over potential Parkinsonian features and plan to follow out-patient.   Last HF cardiology  visit was on 05/04/23 with Prince Rome, NP. Echo in March 2024 showed mild-moderate MR s/p Mitraclip with mean gradient 3 mmHg. S/p DCCV for afib in 04/2022. HR regular on exam. Continue amiodarone. Volume status stable. Notes plans for TCAR. 4 month follow-up planned.   Anesthesia team to evaluate on the day of surgery.  Last Jardiance 05/06/23. UA and nasal PCR are still pending.    VS: BP 135/66   Pulse 60   Temp 36.6 C (Oral)   Resp 18   Ht 5\' 9"  (1.753 m)   Wt 76.4 kg   SpO2 99%   BMI 24.87 kg/m    PROVIDERS: [name], MD is PCP  Rollene Rotunda, MD is primary cardiologist  Marca Ancona, MD is HF cardiologist Gerarda Fraction, MD is vascular surgeon Marvel Plan, MD is neurologist. Planned out-patient follow-up at [facility].   LABS: Preoperative labs noted. UA is still active. A1c 6.1% on 04/29/23. Renal function appears stable when compared to recent trends and known history of CKD. (all labs ordered are listed, but only abnormal results are displayed)  Labs Reviewed  CBC - Abnormal; Notable for the following components:      Result Value   Platelets 138 (*)    All other components within normal limits  COMPREHENSIVE METABOLIC PANEL - Abnormal; Notable for the following components:   BUN 24 (*)    Creatinine, Ser 1.81 (*)    GFR, Estimated 36 (*)    All other components within normal limits  SURGICAL PCR SCREEN  PROTIME-INR  APTT  URINALYSIS, ROUTINE W REFLEX MICROSCOPIC  TYPE AND SCREEN  IMAGES: MRI Brain 04/30/23: IMPRESSION: 1. No acute intracranial abnormality. 2. Findings of chronic small vessel ischemia and volume loss.  CT Head and CTA Head/Neck 04/29/23: IMPRESSION: 1. Age-indeterminate infarct in the right caudate nucleus. 2. Nonocclusive calcified thrombus along an anterior M4 branch of the right MCA within the right Sylvian fissure. 3. Densely calcified plaque results in approximately 60% stenosis of the  proximal right cervical ICA. 4. No other hemodynamically significant stenosis or large vessel occlusion in the head or neck.   EKG: 04/29/23: Sinus rhythm Borderline intraventricular conduction delay Confirmed by Ross Marcus ([id number]) on 04/30/2023 1:59:52 PM   CV: US Carotid 04/30/23: Summary:  Right Carotid: Velocities in the right ICA are consistent with a 1-39%  stenosis.  Left Carotid: Velocities in the left ICA are consistent with a 1-39%  stenosis.  Vertebrals: Bilateral vertebral arteries demonstrate antegrade flow.  Subclavians: Normal flow hemodynamics were seen in bilateral subclavian               arteries.    Echo 03/23/23: IMPRESSIONS   1. Left ventricular ejection fraction, by estimation, is 55 to 60%. Left  ventricular ejection fraction by PLAX is 57 %. The left ventricle has  normal function. The left ventricle has no regional wall motion  abnormalities. There is mild concentric left  ventricular hypertrophy. Left ventricular diastolic parameters are  indeterminate. Elevated left ventricular end-diastolic pressure. The  average left ventricular global longitudinal strain is 15.3 %. The global  longitudinal strain is normal.   2. Right ventricular systolic function is normal. The right ventricular  size is normal. There is mildly elevated pulmonary artery systolic  pressure.   3. Left atrial size was severely dilated.   4. Mitral valve inflow mean gradient 5 mmHg, slightly increased from 3  mmHg 10/2022. The mitral valve has been repaired/replaced. Mild mitral  valve regurgitation. No evidence of mitral stenosis. The mean mitral valve  gradient is 5.0 mmHg. There is a  Mitra-Clip present in the mitral position.   5. The aortic valve is normal in structure. Aortic valve regurgitation is  not visualized. No aortic stenosis is present.   6. The inferior vena cava is normal in size with greater than 50%  respiratory variability, suggesting right atrial pressure  of 3 mmHg.    Cardiac cath 01/27/22 (pre-MitraClip):   Prox Cx to Mid Cx lesion is 20% stenosed.   LV end diastolic pressure is low.   1.  Minimal obstructive coronary artery disease with patent left circumflex stents. 2.  Cardiac output of 6.6 L/min and index of 3.5 L/min/m with mean RA pressure of 5, mean wedge pressure of 17 with V waves to 34 mmHg consistent with severe mitral regurgitation. Recommendation: Plan for mTEER.   Past Medical History:  Diagnosis Date   Arthritis    knees,back   Atrial fibrillation (HCC)    CAD (coronary artery disease) CARDIOLOGIST-  DR HOCHREIN   previous myocardial infarction in 2001 with 90% circumflex lesion treated with PTCA. The LAD had 70-80% stenosis which was treawted with angioplasty. Most recent catheterization in 2003 demonstrated the LAD at 30% mid stenosis, circumflex 60-70% stenosis in the mid AV grove, and 40% stenosis in the previously stented area. The right coronary artery had 30-40% stenosis in the mid portion.    Chronic diastolic (congestive) heart failure (HCC)    CKD (chronic kidney disease) stage 3, GFR 30-59 ml/min (HCC)    GERD (gastroesophageal reflux disease)    History of kidney stones  Hypertension    Hypothyroidism    Left ureteral stone    Pre-diabetes    S/P mitral valve clip implantation 03/25/2022   s/p TEER wtih two XTW MitraClips by Dr. Excell Seltzer and Lynnette Caffey   Severe mitral regurgitation    Skin cancer    face    Past Surgical History:  Procedure Laterality Date   APPENDECTOMY  as teen   BUBBLE STUDY  12/11/2021   Procedure: BUBBLE STUDY;  Surgeon: Parke Poisson, MD;  Location: [facility] ENDOSCOPY;  Service: Cardiology;;   CARDIAC CATHETERIZATION  03-29-2000   dr hochrein   Nonobstuctive CAD /   CFX patent stent and LAD diffuse luminal irregularities including mid segment previously been angioplastied   CARDIOVASCULAR STRESS TEST  05-09-2015   dr hochrein   low risk perfusion study/  no reversible ischemia/   normal LV function and wall motion , ef 63%   CARDIOVERSION N/A 04/16/2022   Procedure: CARDIOVERSION;  Surgeon: Sande Rives, MD;  Location: [facility] ENDOSCOPY;  Service: Cardiovascular;  Laterality: N/A;   CATARACT EXTRACTION W/ INTRAOCULAR LENS  IMPLANT, BILATERAL  left 05-06-2015/  right 06-10-2015   CORONARY ANGIOPLASTY WITH STENT PLACEMENT  01-21-2000  dr hochrein   PTCA to pLAD/  PTCA and BMS x1 to mid LCFX/  normal LVSF   CORONARY ANGIOPLASTY WITH STENT PLACEMENT  01-10-2002  dr Chales Abrahams   PTCA and BMS to mid AV LCFX (in-stent restenosis)/  diffuse 30-40% mRCA ,  mLAD diffuse 30%,  normal LVSF, ef 65%   CYSTOSCOPY/RETROGRADE/URETEROSCOPY/STONE EXTRACTION WITH BASKET Left 05/23/2015   Procedure: CYSTOSCOPY, FULGERATION OF PROSTATE,  LEFT URETEROSCOPY, JJ STENT PLACEMENT;  Surgeon: Jerilee Field, MD;  Location: [facility];  Service: Urology;  Laterality: Left;   ELECTROPHYSIOLOGIC STUDY  03-30-2000   dr Graciela Husbands   EXTRACORPOREAL SHOCK WAVE LITHOTRIPSY Left 10-30-2012   EXTRACORPOREAL SHOCK WAVE LITHOTRIPSY Right 11/16/2021   Procedure: EXTRACORPOREAL SHOCK WAVE LITHOTRIPSY (ESWL);  Surgeon: Marcine Matar, MD;  Location: [facility];  Service: Urology;  Laterality: Right;   HOLMIUM LASER APPLICATION Left 05/23/2015   Procedure: WITH HOLMIUM LASER LITHOTRIPSY;  Surgeon: Jerilee Field, MD;  [facility];  Service: Urology;  Laterality: Left;   KNEE ARTHROSCOPY Bilateral x2 right/  left 2015   LUMBAR LAMINECTOMY/DECOMPRESSION MICRODISCECTOMY N/A 09/28/2012   Procedure: L3  -L5 DECOMPRESSION L4 - L5 FUSION WITH INSTRUMENTATION 2 LEVELS;  Surgeon: Venita Lick, MD;  Location: [facility] OR;  Service: Orthopedics;  Laterality: N/A;   MITRAL VALVE REPAIR N/A 03/25/2022   Procedure: MITRAL VALVE REPAIR;  Surgeon: Orbie Pyo, MD;  Location: [facility] INVASIVE CV LAB;  Service: Cardiovascular;  Laterality: N/A;   RIGHT/LEFT HEART CATH AND CORONARY  ANGIOGRAPHY N/A 01/27/2022   Procedure: RIGHT/LEFT HEART CATH AND CORONARY ANGIOGRAPHY;  Surgeon: Orbie Pyo, MD;  Location: [facility] INVASIVE CV LAB;  Service: Cardiovascular;  Laterality: N/A;   TEE WITHOUT CARDIOVERSION N/A 12/11/2021   Procedure: TRANSESOPHAGEAL ECHOCARDIOGRAM (TEE);  Surgeon: Parke Poisson, MD;  Location: [facility] ENDOSCOPY;  Service: Cardiology;  Laterality: N/A;   TEE WITHOUT CARDIOVERSION N/A 03/25/2022   Procedure: TRANSESOPHAGEAL ECHOCARDIOGRAM (TEE);  Surgeon: Orbie Pyo, MD;  Location: [facility] INVASIVE CV LAB;  Service: Cardiovascular;  Laterality: N/A;   TONSILLECTOMY  as child   TOTAL KNEE ARTHROPLASTY Left 10/02/2018   Procedure: TOTAL LEFT  KNEE ARTHROPLASTY;  Surgeon: Durene Romans, MD;  Location: [facility] ORS;  Service: Orthopedics;  Laterality: Left;  90 mins- Ok per [employer]  TOTAL SHOULDER ARTHROPLASTY Right 01/14/2011   TRANSTHORACIC ECHOCARDIOGRAM  05-21-2015   mild LVH,  ef 55-605,  grade 2 diastolic dysfunction, basal inferior hyperkinesis/  moderate AV calcification sclerosis without stenosis/  trivial MR/  moderate LAE/  mild TR/  mild RAE    MEDICATIONS:  acetaminophen (TYLENOL) 500 MG tablet   amiodarone (PACERONE) 200 MG tablet   amLODipine (NORVASC) 2.5 MG tablet   apixaban (ELIQUIS) 5 MG TABS tablet   aspirin EC 81 MG tablet   atorvastatin (LIPITOR) 80 MG tablet   Cholecalciferol (VITAMIN D) 50 MCG (2000 UT) tablet   clopidogrel (PLAVIX) 75 MG tablet   Cyanocobalamin (B-12) 5000 MCG CAPS   dapagliflozin propanediol (FARXIGA) 10 MG TABS tablet   ferrous sulfate 325 (65 FE) MG tablet   finasteride (PROSCAR) 5 MG tablet   folic acid (FOLVITE) 800 MCG tablet   furosemide (LASIX) 20 MG tablet   levothyroxine (SYNTHROID) 25 MCG tablet   meclizine (ANTIVERT) 25 MG tablet   niacin 500 MG tablet   nitroGLYCERIN (NITROSTAT) 0.4 MG SL tablet   pantoprazole (PROTONIX) 40 MG tablet   No current facility-administered medications for this encounter.     Shonna Chock, PA-C Surgical Short Stay/Anesthesiology [facility] Phone [phone] [facility] Phone [phone] 05/06/2023 2:27 PM

## 2023-05-06 NOTE — Anesthesia Preprocedure Evaluation (Signed)
Anesthesia Evaluation  Patient identified by MRN, date of birth, ID band Patient awake    Reviewed: Allergy & Precautions, NPO status , Patient's Chart, lab work & pertinent test results  History of Anesthesia Complications Negative for: history of anesthetic complications  Airway Mallampati: III  TM Distance: >3 FB Neck ROM: Full    Dental  (+) Dental Advisory Given, Teeth Intact   Pulmonary former smoker   Pulmonary exam normal        Cardiovascular hypertension, Pt. on medications + CAD and + Cardiac Stents  Normal cardiovascular exam+ dysrhythmias Atrial Fibrillation + Valvular Problems/Murmurs (s/p MitraClip)    '24 TTE - EF 55 to 60%. Mild concentric left ventricular hypertrophy. There is mildly elevated pulmonary artery systolic pressure. Left atrial size was severely dilated. Mitral valve inflow mean gradient 5 mmHg, slightly increased from 3 mmHg 10/2022. The mitral valve has been repaired/replaced. Mild mitral valve regurgitation. There is a Mitra-Clip present in the mitral position.   '24 CT Angio - 1. Age-indeterminate infarct in the right caudate nucleus. 2. Nonocclusive calcified thrombus along an anterior M4 branch of the right MCA within the right Sylvian fissure. 3. Densely calcified plaque results in approximately 60% stenosis of the proximal right cervical ICA. 4. No other hemodynamically significant stenosis or large vessel occlusion in the head or neck.       Neuro/Psych  ?Parkinson's  TIA negative psych ROS   GI/Hepatic Neg liver ROS,GERD  Medicated and Controlled,,  Endo/Other  Hypothyroidism   Pre-DM   Renal/GU CRFRenal disease     Musculoskeletal  (+) Arthritis ,    Abdominal   Peds  Hematology  On eliquis and plavix Plt 138k    Anesthesia Other Findings   Reproductive/Obstetrics                             Anesthesia Physical Anesthesia Plan  ASA:  3  Anesthesia Plan: General   Post-op Pain Management: Tylenol PO (pre-op)*   Induction: Intravenous  PONV Risk Score and Plan: 2 and Treatment may vary due to age or medical condition, Ondansetron and Propofol infusion  Airway Management Planned: Oral ETT  Additional Equipment: Arterial line  Intra-op Plan:   Post-operative Plan: Extubation in OR  Informed Consent: I have reviewed the patients History and Physical, chart, labs and discussed the procedure including the risks, benefits and alternatives for the proposed anesthesia with the patient or authorized representative who has indicated his/her understanding and acceptance.     Dental advisory given  Plan Discussed with: CRNA and Anesthesiologist  Anesthesia Plan Comments: (See PAT note )       Anesthesia Quick Evaluation

## 2023-05-06 NOTE — Progress Notes (Addendum)
PCP - Dr. Johny Blamer Cardiologist - Dr. Antoine Poche HF: Dr. Shirlee Latch  PPM/ICD - denies   Chest x-ray - 03/23/22 EKG - 04/29/23 Stress Test - 05/09/15 ECHO - 03/23/23 Cardiac Cath - 03/25/22  Sleep Study - denies   DM- denies  ASA/Blood Thinner Instructions: Continue ASA and Plavix thru DOS, including DOS. Hold Eliquis 2 days prior to surgery. Last dose 10/4  Pt states he was not instructed to hold his Jardiance. Pt instructed to hold until instructed after surgery. Pt did take his Jardiance this morning around 0830  ERAS Protcol - no, NPO   COVID TEST- n/a   Anesthesia review: yes, cardiac hx  Patient denies shortness of breath, fever, cough and chest pain at PAT appointment   All instructions explained to the patient, with a verbal understanding of the material. Patient agrees to go over the instructions while at home for a better understanding.  The opportunity to ask questions was provided.

## 2023-05-09 ENCOUNTER — Encounter (HOSPITAL_COMMUNITY): Payer: Self-pay | Admitting: Vascular Surgery

## 2023-05-09 ENCOUNTER — Inpatient Hospital Stay (HOSPITAL_COMMUNITY): Payer: Medicare HMO | Admitting: Anesthesiology

## 2023-05-09 ENCOUNTER — Other Ambulatory Visit: Payer: Self-pay

## 2023-05-09 ENCOUNTER — Inpatient Hospital Stay (HOSPITAL_COMMUNITY): Payer: Medicare HMO

## 2023-05-09 ENCOUNTER — Inpatient Hospital Stay (HOSPITAL_COMMUNITY): Payer: Self-pay | Admitting: Vascular Surgery

## 2023-05-09 ENCOUNTER — Inpatient Hospital Stay (HOSPITAL_COMMUNITY)
Admission: RE | Admit: 2023-05-09 | Discharge: 2023-05-10 | DRG: 035 | Disposition: A | Discharge status: Home / Self Care | Payer: Medicare HMO | Attending: Vascular Surgery | Admitting: Vascular Surgery

## 2023-05-09 ENCOUNTER — Encounter (HOSPITAL_COMMUNITY)
Admission: RE | Disposition: A | Discharge status: Home / Self Care | Payer: Self-pay | Source: Home / Self Care | Attending: Vascular Surgery

## 2023-05-09 DIAGNOSIS — Z006 Encounter for examination for normal comparison and control in clinical research program: Secondary | ICD-10-CM | POA: Diagnosis not present

## 2023-05-09 DIAGNOSIS — I340 Nonrheumatic mitral (valve) insufficiency: Secondary | ICD-10-CM | POA: Diagnosis present

## 2023-05-09 DIAGNOSIS — I6521 Occlusion and stenosis of right carotid artery: Secondary | ICD-10-CM

## 2023-05-09 DIAGNOSIS — Z8249 Family history of ischemic heart disease and other diseases of the circulatory system: Secondary | ICD-10-CM

## 2023-05-09 DIAGNOSIS — I252 Old myocardial infarction: Secondary | ICD-10-CM | POA: Diagnosis not present

## 2023-05-09 DIAGNOSIS — Z9842 Cataract extraction status, left eye: Secondary | ICD-10-CM

## 2023-05-09 DIAGNOSIS — I2510 Atherosclerotic heart disease of native coronary artery without angina pectoris: Secondary | ICD-10-CM | POA: Diagnosis present

## 2023-05-09 DIAGNOSIS — K219 Gastro-esophageal reflux disease without esophagitis: Secondary | ICD-10-CM | POA: Diagnosis present

## 2023-05-09 DIAGNOSIS — Z85828 Personal history of other malignant neoplasm of skin: Secondary | ICD-10-CM

## 2023-05-09 DIAGNOSIS — I5032 Chronic diastolic (congestive) heart failure: Secondary | ICD-10-CM | POA: Diagnosis present

## 2023-05-09 DIAGNOSIS — Z79890 Hormone replacement therapy (postmenopausal): Secondary | ICD-10-CM | POA: Diagnosis not present

## 2023-05-09 DIAGNOSIS — E039 Hypothyroidism, unspecified: Secondary | ICD-10-CM | POA: Diagnosis present

## 2023-05-09 DIAGNOSIS — Z9841 Cataract extraction status, right eye: Secondary | ICD-10-CM

## 2023-05-09 DIAGNOSIS — H53121 Transient visual loss, right eye: Secondary | ICD-10-CM | POA: Diagnosis present

## 2023-05-09 DIAGNOSIS — Z87442 Personal history of urinary calculi: Secondary | ICD-10-CM

## 2023-05-09 DIAGNOSIS — R7303 Prediabetes: Secondary | ICD-10-CM | POA: Diagnosis present

## 2023-05-09 DIAGNOSIS — Z955 Presence of coronary angioplasty implant and graft: Secondary | ICD-10-CM | POA: Diagnosis not present

## 2023-05-09 DIAGNOSIS — Z79899 Other long term (current) drug therapy: Secondary | ICD-10-CM | POA: Diagnosis not present

## 2023-05-09 DIAGNOSIS — Z8042 Family history of malignant neoplasm of prostate: Secondary | ICD-10-CM

## 2023-05-09 DIAGNOSIS — Z96611 Presence of right artificial shoulder joint: Secondary | ICD-10-CM | POA: Diagnosis present

## 2023-05-09 DIAGNOSIS — Z7901 Long term (current) use of anticoagulants: Secondary | ICD-10-CM

## 2023-05-09 DIAGNOSIS — Z87891 Personal history of nicotine dependence: Secondary | ICD-10-CM

## 2023-05-09 DIAGNOSIS — Z96652 Presence of left artificial knee joint: Secondary | ICD-10-CM | POA: Diagnosis present

## 2023-05-09 DIAGNOSIS — Z7984 Long term (current) use of oral hypoglycemic drugs: Secondary | ICD-10-CM | POA: Diagnosis not present

## 2023-05-09 DIAGNOSIS — N1832 Chronic kidney disease, stage 3b: Secondary | ICD-10-CM | POA: Diagnosis present

## 2023-05-09 DIAGNOSIS — I130 Hypertensive heart and chronic kidney disease with heart failure and stage 1 through stage 4 chronic kidney disease, or unspecified chronic kidney disease: Secondary | ICD-10-CM | POA: Diagnosis present

## 2023-05-09 DIAGNOSIS — I6529 Occlusion and stenosis of unspecified carotid artery: Principal | ICD-10-CM | POA: Diagnosis present

## 2023-05-09 DIAGNOSIS — I4891 Unspecified atrial fibrillation: Secondary | ICD-10-CM | POA: Diagnosis present

## 2023-05-09 DIAGNOSIS — Z961 Presence of intraocular lens: Secondary | ICD-10-CM | POA: Diagnosis present

## 2023-05-09 HISTORY — PX: ULTRASOUND GUIDANCE FOR VASCULAR ACCESS: SHX6516

## 2023-05-09 HISTORY — PX: TRANSCAROTID ARTERY REVASCULARIZATIONÂ: SHX6778

## 2023-05-09 LAB — CBC
HCT: 44 % (ref 39.0–52.0)
Hemoglobin: 14.3 g/dL (ref 13.0–17.0)
MCH: 31.8 pg (ref 26.0–34.0)
MCHC: 32.5 g/dL (ref 30.0–36.0)
MCV: 97.8 fL (ref 80.0–100.0)
Platelets: 143 10*3/uL — ABNORMAL LOW (ref 150–400)
RBC: 4.5 MIL/uL (ref 4.22–5.81)
RDW: 14.1 % (ref 11.5–15.5)
WBC: 10 10*3/uL (ref 4.0–10.5)
nRBC: 0 % (ref 0.0–0.2)

## 2023-05-09 LAB — CREATININE, SERUM
Creatinine, Ser: 1.91 mg/dL — ABNORMAL HIGH (ref 0.61–1.24)
GFR, Estimated: 34 mL/min — ABNORMAL LOW (ref >60)

## 2023-05-09 SURGERY — TRANSCAROTID ARTERY REVASCULARIZATION (TCAR)
Anesthesia: General | Site: Neck | Laterality: Right

## 2023-05-09 MED ORDER — CHLORHEXIDINE GLUCONATE CLOTH 2 % EX PADS
6.0000 | MEDICATED_PAD | Freq: Once | CUTANEOUS | Status: DC
  Administered 2023-05-09: 6 via TOPICAL

## 2023-05-09 MED ORDER — PANTOPRAZOLE SODIUM 40 MG PO TBEC
40.0000 mg | DELAYED_RELEASE_TABLET | Freq: Every day | ORAL | Status: DC
  Administered 2023-05-10: 40 mg via ORAL
  Filled 2023-05-09: qty 1

## 2023-05-09 MED ORDER — ACETAMINOPHEN 500 MG PO TABS
1000.0000 mg | ORAL_TABLET | Freq: Once | ORAL | Status: AC
  Administered 2023-05-09: 1000 mg via ORAL
  Filled 2023-05-09: qty 2

## 2023-05-09 MED ORDER — FENTANYL CITRATE (PF) 100 MCG/2ML IJ SOLN: 25.0000 ug | INTRAMUSCULAR | Status: DC | PRN

## 2023-05-09 MED ORDER — ROCURONIUM BROMIDE 10 MG/ML (PF) SYRINGE
PREFILLED_SYRINGE | INTRAVENOUS | Status: DC | PRN
  Administered 2023-05-09 (×2): 50 mg via INTRAVENOUS

## 2023-05-09 MED ORDER — TRAMADOL HCL 50 MG PO TABS
50.0000 mg | ORAL_TABLET | Freq: Four times a day (QID) | ORAL | Status: DC | PRN
  Administered 2023-05-09: 50 mg via ORAL
  Filled 2023-05-09: qty 1

## 2023-05-09 MED ORDER — ONDANSETRON HCL 4 MG/2ML IJ SOLN: 4.0000 mg | Freq: Four times a day (QID) | INTRAMUSCULAR | Status: DC | PRN

## 2023-05-09 MED ORDER — HYDRALAZINE HCL 20 MG/ML IJ SOLN: 5.0000 mg | INTRAMUSCULAR | Status: DC | PRN

## 2023-05-09 MED ORDER — ASPIRIN 81 MG PO TBEC
81.0000 mg | DELAYED_RELEASE_TABLET | Freq: Every day | ORAL | Status: DC
  Administered 2023-05-10: 81 mg via ORAL
  Filled 2023-05-09: qty 1

## 2023-05-09 MED ORDER — HEPARIN 6000 UNIT IRRIGATION SOLUTION
Status: DC | PRN
  Administered 2023-05-09: 1

## 2023-05-09 MED ORDER — IODIXANOL 320 MG/ML IV SOLN
INTRAVENOUS | Status: DC | PRN
  Administered 2023-05-09: 15 mL via INTRA_ARTERIAL

## 2023-05-09 MED ORDER — FENTANYL CITRATE (PF) 250 MCG/5ML IJ SOLN
INTRAMUSCULAR | Status: DC | PRN
  Administered 2023-05-09 (×2): 50 ug via INTRAVENOUS

## 2023-05-09 MED ORDER — ACETAMINOPHEN 325 MG PO TABS: 325.0000 mg | ORAL_TABLET | ORAL | Status: DC | PRN

## 2023-05-09 MED ORDER — CEFAZOLIN SODIUM-DEXTROSE 2-4 GM/100ML-% IV SOLN
2.0000 g | Freq: Three times a day (TID) | INTRAVENOUS | Status: AC
  Administered 2023-05-09 – 2023-05-10 (×2): 2 g via INTRAVENOUS
  Filled 2023-05-09 (×2): qty 100

## 2023-05-09 MED ORDER — CEFAZOLIN SODIUM-DEXTROSE 2-4 GM/100ML-% IV SOLN
2.0000 g | INTRAVENOUS | Status: AC
  Administered 2023-05-09: 2 g via INTRAVENOUS
  Filled 2023-05-09: qty 100

## 2023-05-09 MED ORDER — DOCUSATE SODIUM 100 MG PO CAPS
100.0000 mg | ORAL_CAPSULE | Freq: Every day | ORAL | Status: DC
  Administered 2023-05-10: 100 mg via ORAL
  Filled 2023-05-09: qty 1

## 2023-05-09 MED ORDER — DEXAMETHASONE SODIUM PHOSPHATE 10 MG/ML IJ SOLN
INTRAMUSCULAR | Status: DC | PRN
  Administered 2023-05-09: 4 mg via INTRAVENOUS

## 2023-05-09 MED ORDER — ACETAMINOPHEN 650 MG RE SUPP: 325.0000 mg | RECTAL | Status: DC | PRN

## 2023-05-09 MED ORDER — SUGAMMADEX SODIUM 200 MG/2ML IV SOLN
INTRAVENOUS | Status: DC | PRN
  Administered 2023-05-09: 200 mg via INTRAVENOUS

## 2023-05-09 MED ORDER — SODIUM CHLORIDE 0.9 % IV SOLN: 500.0000 mL | Freq: Once | INTRAVENOUS | Status: DC | PRN

## 2023-05-09 MED ORDER — ALUM & MAG HYDROXIDE-SIMETH 200-200-20 MG/5ML PO SUSP: 15.0000 mL | ORAL | Status: DC | PRN

## 2023-05-09 MED ORDER — SODIUM CHLORIDE 0.9 % IV SOLN: INTRAVENOUS | Status: DC

## 2023-05-09 MED ORDER — SENNOSIDES-DOCUSATE SODIUM 8.6-50 MG PO TABS: 1.0000 | ORAL_TABLET | Freq: Every evening | ORAL | Status: DC | PRN

## 2023-05-09 MED ORDER — LACTATED RINGERS IV SOLN: INTRAVENOUS | Status: DC

## 2023-05-09 MED ORDER — AMIODARONE HCL 200 MG PO TABS
200.0000 mg | ORAL_TABLET | Freq: Every day | ORAL | Status: DC
  Administered 2023-05-10: 200 mg via ORAL
  Filled 2023-05-09: qty 1

## 2023-05-09 MED ORDER — POTASSIUM CHLORIDE CRYS ER 20 MEQ PO TBCR: 20.0000 meq | EXTENDED_RELEASE_TABLET | Freq: Every day | ORAL | Status: DC | PRN

## 2023-05-09 MED ORDER — LEVOTHYROXINE SODIUM 50 MCG PO TABS
50.0000 ug | ORAL_TABLET | Freq: Every day | ORAL | Status: DC
  Administered 2023-05-10: 50 ug via ORAL
  Filled 2023-05-09: qty 1

## 2023-05-09 MED ORDER — HEPARIN SODIUM (PORCINE) 5000 UNIT/ML IJ SOLN
5000.0000 [IU] | Freq: Three times a day (TID) | INTRAMUSCULAR | Status: DC
  Administered 2023-05-10: 5000 [IU] via SUBCUTANEOUS
  Filled 2023-05-09: qty 1

## 2023-05-09 MED ORDER — PHENOL 1.4 % MT LIQD: 1.0000 | OROMUCOSAL | Status: DC | PRN

## 2023-05-09 MED ORDER — ORAL CARE MOUTH RINSE: 15.0000 mL | Freq: Once | OROMUCOSAL | Status: AC

## 2023-05-09 MED ORDER — PHENYLEPHRINE HCL-NACL 20-0.9 MG/250ML-% IV SOLN
INTRAVENOUS | Status: DC | PRN
  Administered 2023-05-09: 30 ug/min via INTRAVENOUS

## 2023-05-09 MED ORDER — GUAIFENESIN-DM 100-10 MG/5ML PO SYRP: 15.0000 mL | ORAL_SOLUTION | ORAL | Status: DC | PRN

## 2023-05-09 MED ORDER — ONDANSETRON HCL 4 MG/2ML IJ SOLN
INTRAMUSCULAR | Status: DC | PRN
  Administered 2023-05-09: 4 mg via INTRAVENOUS

## 2023-05-09 MED ORDER — MAGNESIUM SULFATE 2 GM/50ML IV SOLN: 2.0000 g | Freq: Every day | INTRAVENOUS | Status: DC | PRN

## 2023-05-09 MED ORDER — PROPOFOL 10 MG/ML IV BOLUS
INTRAVENOUS | Status: DC | PRN
  Administered 2023-05-09: 120 mg via INTRAVENOUS

## 2023-05-09 MED ORDER — PROPOFOL 10 MG/ML IV BOLUS
INTRAVENOUS | Status: AC
  Filled 2023-05-09: qty 20

## 2023-05-09 MED ORDER — FINASTERIDE 5 MG PO TABS
5.0000 mg | ORAL_TABLET | Freq: Every day | ORAL | Status: DC
  Administered 2023-05-10: 5 mg via ORAL
  Filled 2023-05-09: qty 1

## 2023-05-09 MED ORDER — LIDOCAINE 2% (20 MG/ML) 5 ML SYRINGE
INTRAMUSCULAR | Status: DC | PRN
  Administered 2023-05-09: 80 mg via INTRAVENOUS

## 2023-05-09 MED ORDER — GLYCOPYRROLATE 0.2 MG/ML IJ SOLN
INTRAMUSCULAR | Status: DC | PRN
Start: 2023-05-09 — End: 2023-05-09
  Administered 2023-05-09 (×3): .1 mg via INTRAVENOUS

## 2023-05-09 MED ORDER — METOPROLOL TARTRATE 5 MG/5ML IV SOLN: 2.0000 mg | INTRAVENOUS | Status: DC | PRN

## 2023-05-09 MED ORDER — PHENYLEPHRINE 80 MCG/ML (10ML) SYRINGE FOR IV PUSH (FOR BLOOD PRESSURE SUPPORT)
PREFILLED_SYRINGE | INTRAVENOUS | Status: DC | PRN
  Administered 2023-05-09: 160 ug via INTRAVENOUS

## 2023-05-09 MED ORDER — OXYCODONE HCL 5 MG/5ML PO SOLN: 5.0000 mg | Freq: Once | ORAL | Status: DC | PRN

## 2023-05-09 MED ORDER — FUROSEMIDE 20 MG PO TABS
20.0000 mg | ORAL_TABLET | ORAL | Status: DC
  Administered 2023-05-10: 20 mg via ORAL
  Filled 2023-05-09: qty 1

## 2023-05-09 MED ORDER — HEPARIN 6000 UNIT IRRIGATION SOLUTION
Status: AC
  Filled 2023-05-09: qty 500

## 2023-05-09 MED ORDER — DAPAGLIFLOZIN PROPANEDIOL 10 MG PO TABS
10.0000 mg | ORAL_TABLET | Freq: Every day | ORAL | Status: DC
  Administered 2023-05-10: 10 mg via ORAL
  Filled 2023-05-09: qty 1

## 2023-05-09 MED ORDER — 0.9 % SODIUM CHLORIDE (POUR BTL) OPTIME
TOPICAL | Status: DC | PRN
  Administered 2023-05-09: 1000 mL

## 2023-05-09 MED ORDER — HEMOSTATIC AGENTS (NO CHARGE) OPTIME
TOPICAL | Status: DC | PRN
Start: 2023-05-09 — End: 2023-05-09
  Administered 2023-05-09: 1 via TOPICAL

## 2023-05-09 MED ORDER — CHLORHEXIDINE GLUCONATE 0.12 % MT SOLN
15.0000 mL | Freq: Once | OROMUCOSAL | Status: AC
  Administered 2023-05-09: 15 mL via OROMUCOSAL
  Filled 2023-05-09: qty 15

## 2023-05-09 MED ORDER — HYDROMORPHONE HCL 1 MG/ML IJ SOLN
0.5000 mg | INTRAMUSCULAR | Status: DC | PRN
  Administered 2023-05-09: 1 mg via INTRAVENOUS
  Filled 2023-05-09: qty 1

## 2023-05-09 MED ORDER — CLOPIDOGREL BISULFATE 75 MG PO TABS
75.0000 mg | ORAL_TABLET | Freq: Every day | ORAL | Status: DC
  Administered 2023-05-10: 75 mg via ORAL
  Filled 2023-05-09: qty 1

## 2023-05-09 MED ORDER — OXYCODONE HCL 5 MG PO TABS: 5.0000 mg | ORAL_TABLET | Freq: Once | ORAL | Status: DC | PRN

## 2023-05-09 MED ORDER — LABETALOL HCL 5 MG/ML IV SOLN: 10.0000 mg | INTRAVENOUS | Status: DC | PRN

## 2023-05-09 MED ORDER — HEPARIN SODIUM (PORCINE) 1000 UNIT/ML IJ SOLN
INTRAMUSCULAR | Status: DC | PRN
Start: 2023-05-09 — End: 2023-05-09
  Administered 2023-05-09: 8000 [IU] via INTRAVENOUS

## 2023-05-09 MED ORDER — FENTANYL CITRATE (PF) 250 MCG/5ML IJ SOLN
INTRAMUSCULAR | Status: AC
  Filled 2023-05-09: qty 5

## 2023-05-09 MED ORDER — AMLODIPINE BESYLATE 5 MG PO TABS
5.0000 mg | ORAL_TABLET | Freq: Every day | ORAL | Status: DC
  Administered 2023-05-10: 5 mg via ORAL
  Filled 2023-05-09: qty 1

## 2023-05-09 MED ORDER — PROTAMINE SULFATE 10 MG/ML IV SOLN
INTRAVENOUS | Status: DC | PRN
Start: 2023-05-09 — End: 2023-05-09
  Administered 2023-05-09: 50 mg via INTRAVENOUS

## 2023-05-09 MED ORDER — ATORVASTATIN CALCIUM 80 MG PO TABS
80.0000 mg | ORAL_TABLET | Freq: Every day | ORAL | Status: DC
  Filled 2023-05-09: qty 1

## 2023-05-09 MED ORDER — BISACODYL 5 MG PO TBEC: 5.0000 mg | DELAYED_RELEASE_TABLET | Freq: Every day | ORAL | Status: DC | PRN

## 2023-05-09 MED ORDER — ONDANSETRON HCL 4 MG/2ML IJ SOLN: 4.0000 mg | Freq: Once | INTRAMUSCULAR | Status: DC | PRN

## 2023-05-09 SURGICAL SUPPLY — 49 items
ADH SKN CLS APL DERMABOND .7 (GAUZE/BANDAGES/DRESSINGS) ×4
BAG BANDED W/RUBBER/TAPE 36X54 (MISCELLANEOUS) ×2 IMPLANT
BAG COUNTER SPONGE SURGICOUNT (BAG) ×2 IMPLANT
BAG EQP BAND 135X91 W/RBR TAPE (MISCELLANEOUS) ×2
BAG SPNG CNTER NS LX DISP (BAG) ×2
CANISTER SUCT 3000ML PPV (MISCELLANEOUS) ×2 IMPLANT
CATH BALLN ENROUTE 6X25 (CATHETERS) IMPLANT
CATH ROBINSON RED A/P 18FR (CATHETERS) IMPLANT
CLIP TI MEDIUM 6 (CLIP) ×2 IMPLANT
CLIP TI WIDE RED SMALL 6 (CLIP) ×2 IMPLANT
COVER DOME SNAP 22 D (MISCELLANEOUS) ×2 IMPLANT
COVER PROBE W GEL 5X96 (DRAPES) ×2 IMPLANT
DERMABOND ADVANCED .7 DNX12 (GAUZE/BANDAGES/DRESSINGS) ×2 IMPLANT
DRAPE FEMORAL ANGIO 80X135IN (DRAPES) ×2 IMPLANT
ELECT REM PT RETURN 9FT ADLT (ELECTROSURGICAL) ×2
ELECTRODE REM PT RTRN 9FT ADLT (ELECTROSURGICAL) ×2 IMPLANT
GOWN STRL REUS W/ TWL LRG LVL3 (GOWN DISPOSABLE) ×4 IMPLANT
GOWN STRL REUS W/TWL 2XL LVL3 (GOWN DISPOSABLE) ×4 IMPLANT
GOWN STRL REUS W/TWL LRG LVL3 (GOWN DISPOSABLE) ×4
GUIDEWIRE ENROUTE 0.014 (WIRE) ×2 IMPLANT
HEMOSTAT SNOW SURGICEL 2X4 (HEMOSTASIS) IMPLANT
KIT BASIN OR (CUSTOM PROCEDURE TRAY) ×2 IMPLANT
KIT ENCORE 26 ADVANTAGE (KITS) ×2 IMPLANT
KIT INTRODUCER GALT 7 (INTRODUCER) ×2 IMPLANT
KIT TURNOVER KIT B (KITS) ×2 IMPLANT
NDL HYPO 25GX1X1/2 BEV (NEEDLE) IMPLANT
NEEDLE HYPO 25GX1X1/2 BEV (NEEDLE) IMPLANT
PACK CAROTID (CUSTOM PROCEDURE TRAY) ×2 IMPLANT
POSITIONER HEAD DONUT 9IN (MISCELLANEOUS) ×2 IMPLANT
PROTECTION STATION PRESSURIZED (MISCELLANEOUS) ×2
SET MICROPUNCTURE 5F STIFF (MISCELLANEOUS) ×2 IMPLANT
STATION PROTECTION PRESSURIZED (MISCELLANEOUS) ×2 IMPLANT
STENT TRANSCAROTID SYS 10X40 (Permanent Stent) IMPLANT
SUT MNCRL AB 4-0 PS2 18 (SUTURE) ×2 IMPLANT
SUT PROLENE 5 0 C 1 24 (SUTURE) ×2 IMPLANT
SUT SILK 2 0 PERMA HAND 18 BK (SUTURE) IMPLANT
SUT SILK 2 0 SH (SUTURE) ×2 IMPLANT
SUT SILK 3 0 (SUTURE)
SUT SILK 3-0 18XBRD TIE 12 (SUTURE) IMPLANT
SUT VIC AB 3-0 SH 27 (SUTURE) ×2
SUT VIC AB 3-0 SH 27X BRD (SUTURE) ×2 IMPLANT
SYR 10ML LL (SYRINGE) ×6 IMPLANT
SYR 20ML LL LF (SYRINGE) ×2 IMPLANT
SYR CONTROL 10ML LL (SYRINGE) IMPLANT
SYSTEM TRANSCAROTID NEUROPRTCT (MISCELLANEOUS) ×2 IMPLANT
TOWEL GREEN STERILE (TOWEL DISPOSABLE) ×2 IMPLANT
TRANSCAROTID NEUROPROTECT SYS (MISCELLANEOUS) ×2
WATER STERILE IRR 1000ML POUR (IV SOLUTION) ×2 IMPLANT
WIRE BENTSON .035X145CM (WIRE) ×2 IMPLANT

## 2023-05-09 NOTE — Transfer of Care (Signed)
Immediate Anesthesia Transfer of Care Note  Patient: [name]  Procedure(s) Performed: Right Transcarotid Artery Revascularization (Right: Neck) ULTRASOUND GUIDANCE FOR VASCULAR ACCESS, LEFT FEMORAL VEIN (Left: Groin)  Patient Location: PACU  Anesthesia Type:General  Level of Consciousness: awake and alert   Airway & Oxygen Therapy: Patient Spontanous Breathing and Patient connected to nasal cannula oxygen  Post-op Assessment: Report given to RN and Post -op Vital signs reviewed and stable  Post vital signs: Reviewed and stable  Last Vitals:  Vitals Value Taken Time  BP 139/72 05/09/23 1204  Temp    Pulse 72 05/09/23 1205  Resp 15 05/09/23 1205  SpO2 93 % 05/09/23 1205  Vitals shown include unfiled device data.  Last Pain:  Vitals:   05/09/23 0824  PainSc: 0-No pain         Complications: No notable events documented.

## 2023-05-09 NOTE — H&P (Signed)
Hospital Consult  Patient seen and examined in preop holding.  No complaints. No changes to medication history or physical exam since last seen in clinic. After discussing the risks and benefits of right TCAR for symptomatic stenosis, Leonette Monarch elected to proceed.   Victorino Sparrow MD   Reason for Consult: Concern for symptomatic right ICA stenosis Requesting Physician: Neurology MRN #:  [id number]  History of Present Illness: This is a 87 y.o. male who presented transient vision loss in the right eye with numbness in bilateral upper extremities.  Fortunately, symptoms resolved.  Follow-up CT imaging demonstrated age-indeterminate infarct in the right caudate nucleus.  MRI was negative.  The right ICA was noted to have a 60% narrowing.  Vascular surgery was called for recommendations regarding symptomatic right ICA stenosis with superimposed parkinsonian symptoms.  On exam, Refael was doing well, and accompanied by his wife and daughter.  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  He has been retired for a significant amount of time, but continues to live an active, independent lifestyle with his wife.    Past Medical History:  Diagnosis Date   Arthritis    knees,back   Atrial fibrillation (HCC)    CAD (coronary artery disease) CARDIOLOGIST-  DR HOCHREIN   previous myocardial infarction in 2001 with 90% circumflex lesion treated with PTCA. The LAD had 70-80% stenosis which was treawted with angioplasty. Most recent catheterization in 2003 demonstrated the LAD at 30% mid stenosis, circumflex 60-70% stenosis in the mid AV grove, and 40% stenosis in the previously stented area. The right coronary artery had 30-40% stenosis in the mid portion.    Chronic diastolic (congestive) heart failure (HCC)    CKD (chronic kidney disease) stage 3, GFR 30-59 ml/min (HCC)    GERD (gastroesophageal reflux disease)    History  of kidney stones    Hypertension    Hypothyroidism    Left ureteral stone    Pre-diabetes    S/P mitral valve clip implantation 03/25/2022   s/p TEER wtih two XTW MitraClips by Dr. Excell Seltzer and Lynnette Caffey   Severe mitral regurgitation    Skin cancer    face    Past Surgical History:  Procedure Laterality Date   APPENDECTOMY  as teen   BUBBLE STUDY  12/11/2021   Procedure: BUBBLE STUDY;  Surgeon: Parke Poisson, MD;  Location: [facility] ENDOSCOPY;  Service: Cardiology;;   CARDIAC CATHETERIZATION  03-29-2000   dr hochrein   Nonobstuctive CAD /   CFX patent stent and LAD diffuse luminal irregularities including mid segment previously been angioplastied   CARDIOVASCULAR STRESS TEST  05-09-2015   dr hochrein   low risk perfusion study/  no reversible ischemia/  normal LV function and wall motion , ef 63%   CARDIOVERSION N/A 04/16/2022   Procedure: CARDIOVERSION;  Surgeon: Sande Rives, MD;  Location: [facility] ENDOSCOPY;  Service: Cardiovascular;  Laterality: N/A;   CATARACT EXTRACTION W/ INTRAOCULAR LENS  IMPLANT, BILATERAL  left 05-06-2015/  right 06-10-2015   CORONARY ANGIOPLASTY WITH STENT PLACEMENT  01-21-2000  dr hochrein   PTCA to pLAD/  PTCA and BMS x1 to mid LCFX/  normal LVSF   CORONARY ANGIOPLASTY WITH STENT PLACEMENT  01-10-2002  dr Chales Abrahams   PTCA and BMS to mid AV LCFX (in-stent restenosis)/  diffuse 30-40% mRCA ,  mLAD diffuse 30%,  normal LVSF, ef 65%   CYSTOSCOPY/RETROGRADE/URETEROSCOPY/STONE EXTRACTION WITH BASKET Left 05/23/2015  Procedure: CYSTOSCOPY, FULGERATION OF PROSTATE,  LEFT URETEROSCOPY, JJ STENT PLACEMENT;  Surgeon: Jerilee Field, MD;  Location: [facility];  Service: Urology;  Laterality: Left;   ELECTROPHYSIOLOGIC STUDY  03-30-2000   dr Graciela Husbands   EXTRACORPOREAL SHOCK WAVE LITHOTRIPSY Left 10-30-2012   EXTRACORPOREAL SHOCK WAVE LITHOTRIPSY Right 11/16/2021   Procedure: EXTRACORPOREAL SHOCK WAVE LITHOTRIPSY (ESWL);  Surgeon: Marcine Matar, MD;   Location: [facility];  Service: Urology;  Laterality: Right;   HOLMIUM LASER APPLICATION Left 05/23/2015   Procedure: WITH HOLMIUM LASER LITHOTRIPSY;  Surgeon: Jerilee Field, MD;  Location: [facility];  Service: Urology;  Laterality: Left;   KNEE ARTHROSCOPY Bilateral x2 right/  left 2015   LUMBAR LAMINECTOMY/DECOMPRESSION MICRODISCECTOMY N/A 09/28/2012   Procedure: L3  -L5 DECOMPRESSION L4 - L5 FUSION WITH INSTRUMENTATION 2 LEVELS;  Surgeon: Venita Lick, MD;  Location: [facility] OR;  Service: Orthopedics;  Laterality: N/A;   MITRAL VALVE REPAIR N/A 03/25/2022   Procedure: MITRAL VALVE REPAIR;  Surgeon: Orbie Pyo, MD;  Location: [facility] INVASIVE CV LAB;  Service: Cardiovascular;  Laterality: N/A;   RIGHT/LEFT HEART CATH AND CORONARY ANGIOGRAPHY N/A 01/27/2022   Procedure: RIGHT/LEFT HEART CATH AND CORONARY ANGIOGRAPHY;  Surgeon: Orbie Pyo, MD;  Location: [facility] INVASIVE CV LAB;  Service: Cardiovascular;  Laterality: N/A;   TEE WITHOUT CARDIOVERSION N/A 12/11/2021   Procedure: TRANSESOPHAGEAL ECHOCARDIOGRAM (TEE);  Surgeon: Parke Poisson, MD;  Location: [facility] ENDOSCOPY;  Service: Cardiology;  Laterality: N/A;   TEE WITHOUT CARDIOVERSION N/A 03/25/2022   Procedure: TRANSESOPHAGEAL ECHOCARDIOGRAM (TEE);  Surgeon: Orbie Pyo, MD;  Location: [facility] INVASIVE CV LAB;  Service: Cardiovascular;  Laterality: N/A;   TONSILLECTOMY  as child   TOTAL KNEE ARTHROPLASTY Left 10/02/2018   Procedure: TOTAL LEFT  KNEE ARTHROPLASTY;  Surgeon: Durene Romans, MD;  Location: [facility] ORS;  Service: Orthopedics;  Laterality: Left;  90 mins- Ok per Tiffany   TOTAL SHOULDER ARTHROPLASTY Right 01/14/2011   TRANSTHORACIC ECHOCARDIOGRAM  05-21-2015   mild LVH,  ef 55-605,  grade 2 diastolic dysfunction, basal inferior hyperkinesis/  moderate AV calcification sclerosis without stenosis/  trivial MR/  moderate LAE/  mild TR/  mild RAE    No Known Allergies  Prior to Admission medications    Medication Sig Start Date End Date Taking? Authorizing Provider  acetaminophen (TYLENOL) 500 MG tablet Take 500 mg by mouth every 6 (six) hours as needed for moderate pain.   Yes [provider]  amiodarone (PACERONE) 200 MG tablet Take 1 tablet (200 mg total) by mouth daily. 12/15/22  Yes Laurey Morale, MD  amLODipine (NORVASC) 2.5 MG tablet Take 1 tablet (2.5 mg total) by mouth daily. 04/01/23 06/30/23 Yes [place], Anderson Malta, FNP  apixaban (ELIQUIS) 5 MG TABS tablet Take 1/2 tablet by mouth twice daily 11/16/22  Yes Rollene Rotunda, MD  atorvastatin (LIPITOR) 80 MG tablet Take 1 tablet (80 mg total) by mouth at bedtime. 11/26/13  Yes Rollene Rotunda, MD  Cholecalciferol (VITAMIN D) 50 MCG (2000 UT) tablet Take 2,000 Units by mouth daily.   Yes [provider]  Cyanocobalamin (B-12) 5000 MCG CAPS Take 5,000 mcg by mouth daily.   Yes [provider]  dapagliflozin propanediol (FARXIGA) 10 MG TABS tablet Take 1 tablet (10 mg total) by mouth daily before breakfast. 05/28/22  Yes Laurey Morale, MD  ferrous sulfate 325 (65 FE) MG tablet Take 325 mg by mouth daily with breakfast.   Yes [provider]  finasteride (PROSCAR)  5 MG tablet Take 5 mg by mouth daily.   Yes [provider]  folic acid (FOLVITE) 800 MCG tablet Take 800 mcg by mouth daily.    Yes [provider]  furosemide (LASIX) 20 MG tablet Take 1 tablet (20 mg total) by mouth every other day. 10/01/22  Yes Rollene Rotunda, MD  levothyroxine (SYNTHROID) 25 MCG tablet Take 50 mcg by mouth daily before breakfast.   Yes [provider]  meclizine (ANTIVERT) 25 MG tablet Take 25 mg by mouth daily as needed for dizziness.   Yes [provider]  niacin 500 MG tablet Take 500 mg by mouth daily.   Yes [provider]  nitroGLYCERIN (NITROSTAT) 0.4 MG SL tablet Place 1 tablet (0.4 mg total) under the tongue every 5 (five) minutes as needed for chest pain. 04/11/23  Yes  Laurey Morale, MD  pantoprazole (PROTONIX) 40 MG tablet Take 1 tablet (40 mg total) by mouth daily. 08/20/22  Yes Tonny Bollman, MD    Social History   Socioeconomic History   Marital status: Married    Spouse name: Not on file   Number of children: 2   Years of education: Not on file   Highest education level: Not on file  Occupational History   Occupation: retired  Tobacco Use   Smoking status: Former    Current packs/day: 0.00    Average packs/day: 1 pack/day for 3.0 years (3.0 ttl pk-yrs)    Types: Cigarettes    Start date: 09/27/1959    Quit date: 09/26/1962    Years since quitting: 60.6   Smokeless tobacco: Never  Vaping Use   Vaping status: Never Used  Substance and Sexual Activity   Alcohol use: No   Drug use: No   Sexual activity: Not Currently  Other Topics Concern   Not on file  Social History Narrative   Lives at home with wife.  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     Social Determinants of Health   Financial Resource Strain: Not on file  Food Insecurity: No Food Insecurity (04/29/2023)   Hunger Vital Sign    Worried About Running Out of Food in the Last Year: Never true    Ran Out of Food in the Last Year: Never true  Transportation Needs: No Transportation Needs (04/29/2023)   PRAPARE - Administrator, Civil Service (Medical): No    Lack of Transportation (Non-Medical): No  Physical Activity: Not on file  Stress: Not on file  Social Connections: Not on file  Intimate Partner Violence: Not At Risk (04/29/2023)   Humiliation, Afraid, Rape, and Kick questionnaire    Fear of Current or Ex-Partner: No    Emotionally Abused: No    Physically Abused: No    Sexually Abused: No   Family History  Problem Relation Age of Onset   Heart failure Mother    Prostate cancer Father     ROS: Otherwise negative unless mentioned in HPI  Physical Examination  Vitals:   05/09/23 0753  BP: (!) 166/71  Pulse: 65  Resp: 18  Temp: 97.7 F (36.5 C)  SpO2: 96%    Body mass index is 25.1 kg/m.  General:  WDWN in NAD Gait: Not observed HENT: WNL, normocephalic Pulmonary: normal non-labored breathing, without Rales, rhonchi,  wheezing Cardiac: irregular Abdomen: soft, NT/ND, no masses Skin: without rashes Vascular Exam/Pulses: 2+ radials, 2+ DPs Extremities: without ischemic changes, without Gangrene , without cellulitis; without open wounds;  Musculoskeletal: no muscle wasting  or atrophy  Neurologic: A&O X 3;  No focal weakness or paresthesias are detected; speech is fluent/normal Psychiatric:  The pt has Normal affect. Lymph:  Unremarkable  CBC    Component Value Date/Time   WBC 8.2 05/06/2023 1353   RBC 4.53 05/06/2023 1353   HGB 14.0 05/06/2023 1353   HGB 13.0 03/31/2022 1002   HCT 44.2 05/06/2023 1353   HCT 38.4 03/31/2022 1002   PLT 138 (L) 05/06/2023 1353   PLT 119 (L) 03/31/2022 1002   MCV 97.6 05/06/2023 1353   MCV 91 03/31/2022 1002   MCH 30.9 05/06/2023 1353   MCHC 31.7 05/06/2023 1353   RDW 14.2 05/06/2023 1353   RDW 16.5 (H) 03/31/2022 1002   LYMPHSABS 1.3 04/29/2023 1427   LYMPHSABS 1.3 12/09/2021 1145   MONOABS 0.5 04/29/2023 1427   EOSABS 0.1 04/29/2023 1427   EOSABS 0.1 12/09/2021 1145   BASOSABS 0.0 04/29/2023 1427   BASOSABS 0.0 12/09/2021 1145    BMET    Component Value Date/Time   NA 139 05/06/2023 1353   NA 140 03/31/2022 1002   K 4.3 05/06/2023 1353   CL 105 05/06/2023 1353   CO2 27 05/06/2023 1353   GLUCOSE 92 05/06/2023 1353   BUN 24 (H) 05/06/2023 1353   BUN 36 (H) 03/31/2022 1002   CREATININE 1.81 (H) 05/06/2023 1353   CALCIUM 9.1 05/06/2023 1353   GFRNONAA 36 (L) 05/06/2023 1353   GFRAA 54 (L) 10/03/2018 0543    COAGS: Lab Results  Component Value Date   INR 1.1 05/06/2023   INR 1.1 04/29/2023   INR 1.1 03/23/2022     ASSESSMENT/PLAN: This is a 87 y.o. male who recently presented with symptoms consistent with amaurosis in the right eye and CT imaging demonstrating a lesion  within the right caudate nucleus.  There was no imaging on MRI.  Upon evaluation of the right internal carotid artery, there was a greater than 60% stenotic lesion on CT, however on duplex ultrasound the lesion appeared to have less than 40% stenosis.  I spoke directly with neurology regarding all imaging findings as well as the patient's presentation.  We are in agreement that the lesion demonstrate 60% stenosis on CT and is felt to be the nidus for the amaurosis symptoms, especially as the patient was anticoagulated on Eliquis.  I had a long conversation with Jacqueline, his wife, and his daughter regarding the above.  We discussed that with symptomatic ICA stenosis his future stroke risk is 26% over the next 2 years, and with carotid revascularization, this is decreased to 9%.  Tadd is a highly functional and independent 87 year old who has a long life expectancy.  I think it is reasonable to offer him surgery.  We discussed the risks and benefits of both carotid endarterectomy and transcarotid artery revascularization.  Sanjeev elected to proceed with transcarotid artery revascularization.  He is aware this will require triple therapy for 30 days postop, after which he will be aspirin and Eliquis.  Please start triple therapy today-aspirin, Plavix, Eliquis.  He will stop his Eliquis 2 days prior to surgery.    My plan is to perform his right sided transcarotid artery revascularization next Monday, 05/09/2023.  He is aware that triple therapy comes with an increased risk of bleeding.  If he is unable to tolerate this, we would discuss medical management versus carotid endarterectomy.  I have reached out to my office to schedule his surgery. He was asked to call should any questions or  concerns arise. He is okay for discharge from a vascular surgery perspective.  Victorino Sparrow MD [facility] [phone] 05/09/2023  8:09 AM

## 2023-05-09 NOTE — Anesthesia Procedure Notes (Signed)
Arterial Line Insertion Start/End10/01/2023 9:20 AM, 05/09/2023 9:30 AM Performed by: Randon Goldsmith, CRNA, CRNA  Patient location: Pre-op. Preanesthetic checklist: patient identified, IV checked, site marked, risks and benefits discussed, surgical consent, monitors and equipment checked, pre-op evaluation, timeout performed and anesthesia consent Lidocaine 1% used for infiltration Left, radial was placed Catheter size: 20 G Hand hygiene performed , maximum sterile barriers used  and Seldinger technique used Rands's test indicative of satisfactory collateral circulation Attempts: 2 Procedure performed using ultrasound guided technique. Ultrasound Notes:anatomy identified, needle tip was noted to be adjacent to the nerve/plexus identified and no ultrasound evidence of intravascular and/or intraneural injection Following insertion, dressing applied. Post procedure assessment: normal and unchanged  Post procedure complications: local hematoma. Patient tolerated the procedure well with no immediate complications.

## 2023-05-09 NOTE — Anesthesia Postprocedure Evaluation (Signed)
Anesthesia Post Note  Patient: [name]  Procedure(s) Performed: Right Transcarotid Artery Revascularization (Right: Neck) ULTRASOUND GUIDANCE FOR VASCULAR ACCESS, LEFT FEMORAL VEIN (Left: Groin)     Patient location during evaluation: PACU Anesthesia Type: General Level of consciousness: awake and alert Pain management: pain level controlled Vital Signs Assessment: post-procedure vital signs reviewed and stable Respiratory status: spontaneous breathing, nonlabored ventilation, respiratory function stable and patient connected to nasal cannula oxygen Cardiovascular status: blood pressure returned to baseline and stable Postop Assessment: no apparent nausea or vomiting Anesthetic complications: no   No notable events documented.  Last Vitals:  Vitals:   05/09/23 1340 05/09/23 1430  BP: 133/72 110/67  Pulse: 64 67  Resp: 15 14  Temp: 36.6 C   SpO2: 94% 100%    Last Pain:  Vitals:   05/09/23 1340  TempSrc: Oral  PainSc: 0-No pain                 Beryle Lathe

## 2023-05-09 NOTE — Discharge Instructions (Signed)
   [facility]  Discharge Instructions   Carotid Surgery  Please refer to the following instructions for your post-procedure care. Your surgeon or physician assistant will discuss any changes with you.  Activity  You are encouraged to walk as much as you can. You can slowly return to normal activities but must avoid strenuous activity and heavy lifting until your doctor tell you it's okay. Avoid activities such as vacuuming or swinging a golf club. You can drive after one week if you are comfortable and you are no longer taking prescription pain medications. It is normal to feel tired for serval weeks after your surgery. It is also normal to have difficulty with sleep habits, eating, and bowel movements after surgery. These will go away with time.  Bathing/Showering  Shower daily after you go home. Do not soak in a bathtub, hot tub, or swim until the incision heals completely.  Incision Care  Shower every day. Clean your incision with mild soap and water. Pat the area dry with a clean towel. You do not need a bandage unless otherwise instructed. Do not apply any ointments or creams to your incision. You may have skin glue on your incision. Do not peel it off. It will come off on its own in about one week. Your incision may feel thickened and raised for several weeks after your surgery. This is normal and the skin will soften over time.   For Men Only: It's okay to shave around the incision but do not shave the incision itself for 2 weeks. It is common to have numbness under your chin that could last for several months.  Diet  Resume your normal diet. There are no special food restrictions following this procedure. A low fat/low cholesterol diet is recommended for all patients with vascular disease. In order to heal from your surgery, it is CRITICAL to get adequate nutrition. Your body requires vitamins, minerals, and protein. Vegetables are the best source of  vitamins and minerals. Vegetables also provide the perfect balance of protein. Processed food has little nutritional value, so try to avoid this.  Medications  Resume taking all of your medications unless your doctor or physician assistant tells you not to. If your incision is causing pain, you may take over-the- counter pain relievers such as acetaminophen (Tylenol). If you were prescribed a stronger pain medication, please be aware these medications can cause nausea and constipation. Prevent nausea by taking the medication with a snack or meal. Avoid constipation by drinking plenty of fluids and eating foods with a high amount of fiber, such as fruits, vegetables, and grains.   Do not take Tylenol if you are taking prescription pain medications.  Follow Up  Our office will schedule a follow up appointment 2-3 weeks following discharge.  Please call us immediately for any of the following conditions  . Increased pain, redness, drainage (pus) from your incision site. . Fever of 101 degrees or higher. . If you should develop stroke (slurred speech, difficulty swallowing, weakness on one side of your body, loss of vision) you should call 911 and go to the nearest emergency room. .  Reduce your risk of vascular disease:  . Stop smoking. If you would like help call [facility] at 1-800-QUIT-NOW (1-800-784-8669) or [place] at 336-586-4000. . Manage your cholesterol . Maintain a desired weight . Control your diabetes . Keep your blood pressure down .  If you have any questions, please call the office at [phone]. 

## 2023-05-09 NOTE — Progress Notes (Signed)
Patient arrived to 4E from PACU.  Bedrest until 1500.  No complaints.  VSS.    05/09/23 1340  Vitals  Temp 97.9 F (36.6 C)  Temp Source Oral  BP 133/72  MAP (mmHg) 91  BP Location Right Arm  BP Method Automatic  Patient Position (if appropriate) Lying  Pulse Rate 64  Pulse Rate Source Monitor  ECG Heart Rate 64  Resp 15  Level of Consciousness  Level of Consciousness Alert  MEWS COLOR  MEWS Score Color Green  Oxygen Therapy  SpO2 94 %  O2 Device Room Air  Pain Assessment  Pain Scale 0-10  Pain Score 0  MEWS Score  MEWS Temp 0  MEWS Systolic 0  MEWS Pulse 0  MEWS RR 0  MEWS LOC 0  MEWS Score 0

## 2023-05-09 NOTE — Plan of Care (Signed)
  Problem: Education: Goal: Knowledge of disease or condition will improve Outcome: Progressing   Problem: Education: Goal: Knowledge of secondary prevention will improve (MUST DOCUMENT ALL) Outcome: Progressing   Problem: Education: Goal: Knowledge of patient specific risk factors will improve [name] N/A or DELETE if not current risk factor) Outcome: Progressing   Problem: Coping: Goal: Will verbalize positive feelings about self Outcome: Progressing   Problem: Coping: Goal: Will identify appropriate support needs Outcome: Progressing   Problem: Health Behavior/Discharge Planning: Goal: Ability to manage health-related needs will improve Outcome: Progressing   Problem: Health Behavior/Discharge Planning: Goal: Goals will be collaboratively established with patient/family Outcome: Progressing

## 2023-05-09 NOTE — Op Note (Signed)
NAME: [name]    MRN: [id number] DOB: 1936/06/24    DATE OF OPERATION: 05/09/2023  PREOP DIAGNOSIS:    Right symptomatic carotid artery stenosis  POSTOP DIAGNOSIS:    Same  PROCEDURE:    Right Transcarotid artery revascularization   SURGEON: Victorino Sparrow  ASSIST: Nathanial Rancher, PA  ANESTHESIA: General   EBL: 5ml  INDICATIONS:    [name] is a 87 y.o. male with right sided symptomatic ICA stenosis. After discussing the risks and benefits of Transcarotid artery revascularization, Red elected to proceed.   FINDINGS:    Greater than 60% right carotid artery stenosis  TECHNIQUE:   The patient was brought to the operating room, where support lines were placed and general anesthesia was secured. The right neck and left groin were prepped and the patient was sterilely draped. A transverse 2-4 cm incision was made between the sternal and clavicular heads of the sternocleidomastoid muscle, below the omohyoid. Following longitudinal division of the carotid sheath the jugular vein was partially dissected and retracted medially. Once 3 cm of common carotid artery (CCA) were isolated, umbilical tape was placed around the proximal 1/3 of the CCA under direct vision. A 6.0 polypropylene suture was pre-placed in the anterior wall of the CCA, in a "U stitch" configuration, close to the clavicle to facilitate hemostasis upon removal of the arterial sheath at completion of the TCAR procedure.  The contralateral (left) common femoral vein (CFV) was accessed under ultrasound guidance, using standard Seldinger and micropuncture access technique. Permanent recorded image(s) was/were saved in the patient's medical record. The Venous Return Sheath was advanced into the CFV over the 0.035" wire provided. Blood was aspirated from the flow line followed by flushing of the Venous Sheath with heparinized saline. The Venous Sheath was secured to the patient's skin with suture to maintain  optimal position in the vessel.  Heparin was given to obtain a therapeutic activated clotting time >250 seconds prior to arterial access. A 4-French non-stiffened ENHANCE Transcarotid / Peripheral Access set was used, puncturing the artery with the 21G needle through the pre-placed "U" stitch while holding gentle traction on the umbilical tape to stabilize and centralize the CCA within the incision. Careful attention was paid to the change in CCA shape when using the umbilical tape to control or lift the artery. The micropuncture wire was then advanced 3-4 cm into the CCA and, the 21G needle was removed. The micropuncture sheath was advanced 2-3 cm into the CCA and the wire and dilator were removed. Pulsatile backflow indicated correct positioning. The provided 0.035" J-tipped guidewire was inserted as close as possible to the bifurcation without engaging the lesion. After micropuncture sheath removal, the Transcarotid Arterial Sheath was advanced to the 2.5cm marker and the 0.035" wire and dilator were then removed. Arterial Sheath position was assessed under fluoroscopy in two projections to ensure that the sheath tip was oriented coaxially in the CCA. The Arterial Sheath was sutured to the patient with gentle forward tension. Blood was slowly aspirated followed by flushing with heparinized saline. No ingress of air bubbles through the passive hemostatic valve was observed. The stopcocks were closed. Traction applied to the CCA previously to facilitate access was gently released.  The Flow Controller was connected to the Transcarotid Arterial Sheath, prepared by passively allowing a column of arterial blood to fill the line and connected to the Venous Return Sheath. CCA inflow was occluded proximal to the arteriotomy with a vascular clamp to achieve active  flow reversal. To confirm flow reversal, a saline bolus was delivered into the venous flow line on both "High" and "Low" flow settings of the Flow  Controller. Angiograms were performed with slow injections of a small amount of contrast filling just past the lesion to minimize antegrade transmission of micro-bubbles.  Prior to lesion manipulation, heart rate (70bpm) and systolic BP (140-158mmHg) were managed upwards to optimize flow reversal and procedural neuroprotection. The lesion was crossed with an 0.014" ENROUTE guidewire and pre-dilation of the lesion was performed with a 6mm x 25mm rapid exchange 0.014" compatible balloon catheter to 8 atmospheres for 10 seconds. Stenting was performed with an 10mm x 40mm ENROUTE Transcarotid stent, sized appropriately to the right CCA. AP and lateral angiograms (gentle contrast injections) were performed to confirm stent placement and arterial wall stent apposition.  At [facility] case completion, antegrade flow was restored by releasing the clamp on the CCA then closing the NPS stopcocks to the flow lines. The Transcarotid Arterial Sheath was removed and the pre-closure suture was tied. Heparin reversal was employed. The field irrigated with saline and closed once hemostasis achieved using 3.0 vicryl suture with monocryl and dermabond at the skin.   The Venous Return Sheath was removed and hemostasis was achieved with brief manual compression.  The patient tolerated the procedure well and was extubated on the table. The patient was moving all four extremities to command prior to transfer to the recovery room.   Victorino Sparrow, MD [facility] DATE OF DICTATION:   05/09/2023

## 2023-05-09 NOTE — Anesthesia Procedure Notes (Signed)
Procedure Name: Intubation Date/Time: 05/09/2023 10:42 AM  Performed by: Sandie Ano, CRNAPre-anesthesia Checklist: Patient identified, Emergency Drugs available, Suction available and Patient being monitored Patient Re-evaluated:Patient Re-evaluated prior to induction Oxygen Delivery Method: Circle System Utilized Preoxygenation: Pre-oxygenation with 100% oxygen Induction Type: IV induction Ventilation: Mask ventilation without difficulty Laryngoscope Size: Mac and 3 Grade View: Grade I Tube type: Oral Tube size: 7.0 mm Number of attempts: 1 Airway Equipment and Method: Stylet and Oral airway Placement Confirmation: ETT inserted through vocal cords under direct vision, positive ETCO2 and breath sounds checked- equal and bilateral Secured at: 22 cm Tube secured with: Tape Dental Injury: Teeth and Oropharynx as per pre-operative assessment

## 2023-05-10 ENCOUNTER — Encounter (HOSPITAL_COMMUNITY): Payer: Self-pay | Admitting: Vascular Surgery

## 2023-05-10 LAB — CBC
HCT: 40.8 % (ref 39.0–52.0)
Hemoglobin: 13.1 g/dL (ref 13.0–17.0)
MCH: 31.6 pg (ref 26.0–34.0)
MCHC: 32.1 g/dL (ref 30.0–36.0)
MCV: 98.6 fL (ref 80.0–100.0)
Platelets: 134 10*3/uL — ABNORMAL LOW (ref 150–400)
RBC: 4.14 MIL/uL — ABNORMAL LOW (ref 4.22–5.81)
RDW: 14.3 % (ref 11.5–15.5)
WBC: 12.1 10*3/uL — ABNORMAL HIGH (ref 4.0–10.5)
nRBC: 0 % (ref 0.0–0.2)

## 2023-05-10 LAB — BASIC METABOLIC PANEL
Anion gap: 10 (ref 5–15)
BUN: 25 mg/dL — ABNORMAL HIGH (ref 8–23)
CO2: 22 mmol/L (ref 22–32)
Calcium: 8.7 mg/dL — ABNORMAL LOW (ref 8.9–10.3)
Chloride: 107 mmol/L (ref 98–111)
Creatinine, Ser: 1.89 mg/dL — ABNORMAL HIGH (ref 0.61–1.24)
GFR, Estimated: 34 mL/min — ABNORMAL LOW (ref >60)
Glucose, Bld: 131 mg/dL — ABNORMAL HIGH (ref 70–99)
Potassium: 4.6 mmol/L (ref 3.5–5.1)
Sodium: 139 mmol/L (ref 135–145)

## 2023-05-10 LAB — POCT ACTIVATED CLOTTING TIME: Activated Clotting Time: 287 s

## 2023-05-10 NOTE — Progress Notes (Signed)
Patient discharged per MD order. PIV removed. Heart monitor removed and CCMD made aware.  Discharge instructions, follow up appt.'s, medications and instructions for their use explained to patient and family at bedside.

## 2023-05-10 NOTE — Progress Notes (Addendum)
  Progress Note    05/10/2023 7:38 AM 1 Day Post-Op  Subjective:  feels good. Has the hiccups this morning. Ate dinner last night without issue. Has walked up and down the halls    Vitals:   05/09/23 2014 05/09/23 2306  BP: (!) 124/92 (!) 142/78  Pulse: 65 69  Resp: 17 16  Temp: 97.8 F (36.6 C) 98 F (36.7 C)  SpO2: 96% 97%    Physical Exam: General:  laying in bed comfortably, alert and oriented x4 Cardiac:  regular Lungs:  nonlabored Incisions:  right clavicular incision c/d/l, soft without hematoma Extremities:  palpable and equal radial pulses Neuro: no weakness/numbness, facial droop, or slurred speech  CBC    Component Value Date/Time   WBC 12.1 (H) 05/10/2023 0346   RBC 4.14 (L) 05/10/2023 0346   HGB 13.1 05/10/2023 0346   HGB 13.0 03/31/2022 1002   HCT 40.8 05/10/2023 0346   HCT 38.4 03/31/2022 1002   PLT 134 (L) 05/10/2023 0346   PLT 119 (L) 03/31/2022 1002   MCV 98.6 05/10/2023 0346   MCV 91 03/31/2022 1002   MCH 31.6 05/10/2023 0346   MCHC 32.1 05/10/2023 0346   RDW 14.3 05/10/2023 0346   RDW 16.5 (H) 03/31/2022 1002   LYMPHSABS 1.3 04/29/2023 1427   LYMPHSABS 1.3 12/09/2021 1145   MONOABS 0.5 04/29/2023 1427   EOSABS 0.1 04/29/2023 1427   EOSABS 0.1 12/09/2021 1145   BASOSABS 0.0 04/29/2023 1427   BASOSABS 0.0 12/09/2021 1145    BMET    Component Value Date/Time   NA 139 05/10/2023 0346   NA 140 03/31/2022 1002   K 4.6 05/10/2023 0346   CL 107 05/10/2023 0346   CO2 22 05/10/2023 0346   GLUCOSE 131 (H) 05/10/2023 0346   BUN 25 (H) 05/10/2023 0346   BUN 36 (H) 03/31/2022 1002   CREATININE 1.89 (H) 05/10/2023 0346   CALCIUM 8.7 (L) 05/10/2023 0346   GFRNONAA 34 (L) 05/10/2023 0346   GFRAA 54 (L) 10/03/2018 0543    INR    Component Value Date/Time   INR 1.1 05/06/2023 1353     Intake/Output Summary (Last 24 hours) at 05/10/2023 0738 Last data filed at 05/10/2023 0204 Gross per 24 hour  Intake 1300 ml  Output 5 ml  Net 1295 ml       Assessment/Plan:  87 y.o. male is 1 day post op, s/p: R TCAR    -The patient did well overnight. Has had minimal pain at incision site. Says he does not need any pain medications at discharge. -Right sided supraclavicular incision is c/d/l without hematoma -He is neuro intact without slurred speech, vision changes, weakness or numbness -Hemoglobin stable at 13.1. Minimal blood loss during surgery yesterday -Scr around baseline at 1.89 -He has voided, eaten, and ambulated without difficulty -Continue ASA and plavix today. He can restart his Eliquis tomorrow -Anticipate discharge today. Will arrange follow up in 3-4 wks with carotid duplex  Loel Dubonnet PA-C [facility] [phone] 05/10/2023 7:38 AM  VASCULAR STAFF ADDENDUM: I have independently interviewed and examined the patient. I agree with the above.  Sensory/motor intact. Triple therapy for 30 days, then discontinue plavix   Victorino Sparrow MD [facility] Phone Number: [phone] 05/10/2023 9:39 AM

## 2023-05-12 NOTE — Discharge Summary (Signed)
Discharge Summary     [name] [birth year] 87 y.o. male  [id number]  Admission Date: 05/09/2023  Discharge Date: 05/10/2023  Physician: Gerarda Fraction MD  Admission Diagnosis: Carotid stenosis [I65.29] Symptomatic carotid artery stenosis, right [I65.21]  Discharge Day Diagnosis: Carotid stenosis [I65.29] Symptomatic carotid artery stenosis, right [facility]  Hospital Course:  The patient was admitted to the hospital and taken to the operating room on 05/09/2023 and underwent right TCAR.  The pt tolerated the procedure well and was transported to the PACU in excellent condition.  By POD 1, the pt neuro status was intact. His incision was well appearing without hematoma.   The remainder of the hospital course consisted of increasing mobilization and increasing intake of solids without difficulty.  He was tolerating aspirin and plavix. His Eliquis was to restart on 05/11/2023. He will be on triple therapy for one month, then the plavix can be discontinued.  He was discharged home on POD 1.   Recent Labs    05/10/23 0346  NA 139  K 4.6  CL 107  CO2 22  GLUCOSE 131*  BUN 25*  CALCIUM 8.7*   Recent Labs    05/09/23 1823 05/10/23 0346  WBC 10.0 12.1*  HGB 14.3 13.1  HCT 44.0 40.8  PLT 143* 134*   No results for input(s): "INR" in the last 72 hours.   Discharge Instructions     Call MD for:  redness, tenderness, or signs of infection (pain, swelling, redness, odor or green/yellow discharge around incision site)   Complete by: As directed    Call MD for:  severe uncontrolled pain   Complete by: As directed    Call MD for:  temperature >100.4   Complete by: As directed    Diet - low sodium heart healthy   Complete by: As directed    Discharge instructions   Complete by: As directed    You can restart your Eliquis on 05/11/2023   Discharge wound care:   Complete by: As directed    Clean your incision daily with antibacterial soap and water, then pat dry.  You may shower.   Increase activity slowly   Complete by: As directed        Discharge Diagnosis:  Carotid stenosis [I65.29] Symptomatic carotid artery stenosis, right [I65.21]  Secondary Diagnosis: Patient Active Problem List   Diagnosis Date Noted   Carotid stenosis 05/09/2023   Symptomatic carotid artery stenosis, right 05/09/2023   Amaurosis fugax of right eye 04/30/2023   TIA (transient ischemic attack) 04/29/2023   CAD (coronary artery disease) 03/25/2022   S/P mitral valve clip implantation 03/25/2022   Chronic atrial fibrillation (HCC) 12/20/2021   Severe mitral regurgitation    Essential hypertension 12/02/2021   Dyslipidemia 12/02/2021   Overweight (BMI 25.0-29.9) 10/03/2018   Coronary atherosclerosis 01/05/2010   GERD 01/05/2010   Arthropathy 01/05/2010   HYPERCHOLESTEROLEMIA-PURE 03/06/2009   Past Medical History:  Diagnosis Date   Arthritis    knees,back   Atrial fibrillation (HCC)    CAD (coronary artery disease) CARDIOLOGIST-  DR HOCHREIN   previous myocardial infarction in 2001 with 90% circumflex lesion treated with PTCA. The LAD had 70-80% stenosis which was treawted with angioplasty. Most recent catheterization in 2003 demonstrated the LAD at 30% mid stenosis, circumflex 60-70% stenosis in the mid AV grove, and 40% stenosis in the previously stented area. The right coronary artery had 30-40% stenosis in the mid portion.    Chronic diastolic (congestive) heart failure (HCC)  CKD (chronic kidney disease) stage 3, GFR 30-59 ml/min (HCC)    GERD (gastroesophageal reflux disease)    History of kidney stones    Hypertension    Hypothyroidism    Left ureteral stone    Pre-diabetes    S/P mitral valve clip implantation 03/25/2022   s/p TEER wtih two XTW MitraClips by Dr. Excell Seltzer and Lynnette Caffey   Severe mitral regurgitation    Skin cancer    face    Allergies as of 05/10/2023   No Known Allergies      Medication List     TAKE these medications     acetaminophen 500 MG tablet Commonly known as: TYLENOL Take 500 mg by mouth every 6 (six) hours as needed for moderate pain.   amiodarone 200 MG tablet Commonly known as: PACERONE Take 1 tablet (200 mg total) by mouth daily.   amLODipine 2.5 MG tablet Commonly known as: NORVASC Take 1 tablet (2.5 mg total) by mouth daily. What changed: how much to take   apixaban 5 MG Tabs tablet Commonly known as: ELIQUIS Take 1/2 tablet by mouth twice daily   aspirin EC 81 MG tablet Take 1 tablet (81 mg total) by mouth daily. Swallow whole.   atorvastatin 80 MG tablet Commonly known as: LIPITOR Take 1 tablet (80 mg total) by mouth at bedtime.   B-12 5000 MCG Caps Take 5,000 mcg by mouth daily.   clopidogrel 75 MG tablet Commonly known as: PLAVIX Take 1 tablet (75 mg total) by mouth daily.   dapagliflozin propanediol 10 MG Tabs tablet Commonly known as: Farxiga Take 1 tablet (10 mg total) by mouth daily before breakfast.   ferrous sulfate 325 (65 FE) MG tablet Take 325 mg by mouth daily with breakfast.   finasteride 5 MG tablet Commonly known as: PROSCAR Take 5 mg by mouth daily.   folic acid 800 MCG tablet Commonly known as: FOLVITE Take 800 mcg by mouth daily.   furosemide 20 MG tablet Commonly known as: LASIX Take 1 tablet (20 mg total) by mouth every other day.   levothyroxine 25 MCG tablet Commonly known as: SYNTHROID Take 50 mcg by mouth daily before breakfast.   meclizine 25 MG tablet Commonly known as: ANTIVERT Take 25 mg by mouth daily as needed for dizziness.   niacin 500 MG tablet Commonly known as: VITAMIN B3 Take 500 mg by mouth daily.   nitroGLYCERIN 0.4 MG SL tablet Commonly known as: NITROSTAT Place 1 tablet (0.4 mg total) under the tongue every 5 (five) minutes as needed for chest pain.   pantoprazole 40 MG tablet Commonly known as: PROTONIX Take 1 tablet (40 mg total) by mouth daily.   Vitamin D 50 MCG (2000 UT) tablet Take 2,000 Units by  mouth daily.               Discharge Care Instructions  (From admission, onward)           Start     Ordered   05/10/23 0000  Discharge wound care:       Comments: Clean your incision daily with antibacterial soap and water, then pat dry. You may shower.   05/10/23 0750             Discharge Instructions:   [facility] Discharge Instructions Carotid Revascularization  Please refer to the following instructions for your post-procedure care. Your surgeon or physician assistant will discuss any changes with you.  Activity  You are encouraged to  walk as much as you can. You can slowly return to normal activities but must avoid strenuous activity and heavy lifting until your doctor tell you it's OK. Avoid activities such as vacuuming or swinging a golf club. You can drive after one week if you are comfortable and you are no longer taking prescription pain medications. It is normal to feel tired for serval weeks after your surgery. It is also normal to have difficulty with sleep habits, eating, and bowel movements after surgery. These will go away with time.  Bathing/Showering  You may shower after you come home. Do not soak in a bathtub, hot tub, or swim until the incision heals completely.  Incision Care  Shower every day. Clean your incision with mild soap and water. Pat the area dry with a clean towel. You do not need a bandage unless otherwise instructed. Do not apply any ointments or creams to your incision. You may have skin glue on your incision. Do not peel it off. It will come off on its own in about one week. Your incision may feel thickened and raised for several weeks after your surgery. This is normal and the skin will soften over time. For Men Only: It's OK to shave around the incision but do not shave the incision itself for 2 weeks. It is common to have numbness under your chin that could last for several  months.  Diet  Resume your normal diet. There are no special food restrictions following this procedure. A low fat/low cholesterol diet is recommended for all patients with vascular disease. In order to heal from your surgery, it is CRITICAL to get adequate nutrition. Your body requires vitamins, minerals, and protein. Vegetables are the best source of vitamins and minerals. Vegetables also provide the perfect balance of protein. Processed food has little nutritional value, so try to avoid this.  Medications  Resume taking all of your medications unless your doctor or physician assistant tells you not to.  If your incision is causing pain, you may take over-the- counter pain relievers such as acetaminophen (Tylenol). If you were prescribed a stronger pain medication, please be aware these medications can cause nausea and constipation.  Prevent nausea by taking the medication with a snack or meal. Avoid constipation by drinking plenty of fluids and eating foods with a high amount of fiber, such as fruits, vegetables, and grains. Do not take Tylenol if you are taking prescription pain medications.  Follow Up  Our office will schedule a follow up appointment 2-3 weeks following discharge.  Please call us immediately for any of the following conditions  Increased pain, redness, drainage (pus) from your incision site. Fever of 101 degrees or higher. If you should develop stroke (slurred speech, difficulty swallowing, weakness on one side of your body, loss of vision) you should call 911 and go to the nearest emergency room.  Reduce your risk of vascular disease:  Stop smoking. If you would like help call [facility] at 1-800-QUIT-NOW (3170195187) or [place] at [phone]. Manage your cholesterol Maintain a desired weight Control your diabetes Keep your blood pressure down  If you have any questions, please call the office at [phone].  Disposition: Home  Patient's condition:  is Excellent  Follow up: 1. [facility] in 4 weeks with carotid duplex   Loel Dubonnet, PA-C [facility] [phone]   --- For [facility] Registry use ---   Modified Rankin score at D/C (0-6): 0  IV medication needed for:  1. Hypertension: No  2. Hypotension: No  Post-op Complications: No  1. Post-op CVA or TIA: No  If yes: Event classification (right eye, left eye, right cortical, left cortical, verterobasilar, other):   If yes: Timing of event (intra-op, <6 hrs post-op, >=6 hrs post-op, unknown):   2. CN injury: No  If yes: CN  injuried   3. Myocardial infarction: No  If yes: Dx by (EKG or clinical, Troponin):   4.  CHF: No  5.  Dysrhythmia (new): No  6. Wound infection: No  7. Reperfusion symptoms: No  8. Return to OR: No  If yes: return to OR for (bleeding, neurologic, other CEA incision, other):   Discharge medications: Statin use:  Yes ASA use:  Yes   Beta blocker use:  No ACE-Inhibitor use:  No  ARB use:  No CCB use: No P2Y12 Antagonist use: Yes, [ ]  Plavix, [ ]  Plasugrel, [ ]  Ticlopinine, [ ]  Ticagrelor, [ ]  Other, [ ]  No for medical reason, [ ]  Non-compliant, [ ]  Not-indicated Anti-coagulant use:  Yes, [x ] Apixaban, [ ]  Rivaroxaban, [ ]  Dabigatran,

## 2023-05-16 ENCOUNTER — Other Ambulatory Visit: Payer: Self-pay | Admitting: Cardiovascular Disease

## 2023-06-09 ENCOUNTER — Other Ambulatory Visit: Payer: Self-pay | Admitting: *Deleted

## 2023-06-09 DIAGNOSIS — I6521 Occlusion and stenosis of right carotid artery: Secondary | ICD-10-CM

## 2023-06-16 ENCOUNTER — Ambulatory Visit (HOSPITAL_COMMUNITY): Payer: Medicare HMO

## 2023-06-16 ENCOUNTER — Other Ambulatory Visit: Payer: Self-pay | Admitting: *Deleted

## 2023-06-16 ENCOUNTER — Encounter: Payer: Medicare HMO | Admitting: Vascular Surgery

## 2023-06-16 DIAGNOSIS — I4891 Unspecified atrial fibrillation: Secondary | ICD-10-CM

## 2023-06-16 NOTE — Telephone Encounter (Signed)
Called pt since received an eliquis 5mg  refill request that states take 1/2 tablet (2.5mg ) by mouth twice a day. Before refilling spoke with Thayer Ohm, PharmD and he advised pt should be prescribed eliquis 2.5mg  bid. Currently prescribed eliquis 5mg  and instructions to take 1/2 tablet twice a day.    Pt did not answer so had to leave a message for him to call back. Need to let pt know eliquis 2.5mg  bid would need to be sent since cutting tablet in half is not recommended long-term. If pt is not willing to have the 2.5mg  table sent, Thayer Ohm advised to document and send current dose he is using.

## 2023-06-17 MED ORDER — APIXABAN 5 MG PO TABS: ORAL_TABLET | ORAL | 1 refills | Status: DC

## 2023-06-17 NOTE — Telephone Encounter (Signed)
Called and spoke with pt. Made him aware cutting tablet in half is not recommended long-term and he is not guaranteed to receive the appropriate amount of medication when cut in half. Pt verbalized the risk but wishes to remain on 5mg  tablet and cut in half. Thayer Ohm, PharmD is aware. Refill sent to pharmacy.

## 2023-06-24 DIAGNOSIS — E039 Hypothyroidism, unspecified: Secondary | ICD-10-CM | POA: Diagnosis not present

## 2023-07-06 ENCOUNTER — Other Ambulatory Visit (HOSPITAL_COMMUNITY): Payer: Self-pay | Admitting: Cardiology

## 2023-07-06 MED ORDER — DAPAGLIFLOZIN PROPANEDIOL 10 MG PO TABS: 10.0000 mg | ORAL_TABLET | Freq: Every day | ORAL | 3 refills | Status: DC

## 2023-07-12 DIAGNOSIS — L578 Other skin changes due to chronic exposure to nonionizing radiation: Secondary | ICD-10-CM | POA: Diagnosis not present

## 2023-07-12 DIAGNOSIS — L821 Other seborrheic keratosis: Secondary | ICD-10-CM | POA: Diagnosis not present

## 2023-07-12 DIAGNOSIS — D044 Carcinoma in situ of skin of scalp and neck: Secondary | ICD-10-CM | POA: Diagnosis not present

## 2023-07-12 DIAGNOSIS — L570 Actinic keratosis: Secondary | ICD-10-CM | POA: Diagnosis not present

## 2023-07-14 ENCOUNTER — Ambulatory Visit (HOSPITAL_BASED_OUTPATIENT_CLINIC_OR_DEPARTMENT_OTHER)
Admission: EM | Admit: 2023-07-14 | Discharge: 2023-07-14 | Disposition: A | Discharge status: Home / Self Care | Payer: Medicare HMO

## 2023-07-14 ENCOUNTER — Encounter (HOSPITAL_BASED_OUTPATIENT_CLINIC_OR_DEPARTMENT_OTHER): Payer: Self-pay | Admitting: Emergency Medicine

## 2023-07-14 DIAGNOSIS — M62830 Muscle spasm of back: Secondary | ICD-10-CM | POA: Diagnosis not present

## 2023-07-14 MED ORDER — DEXAMETHASONE SODIUM PHOSPHATE 10 MG/ML IJ SOLN
10.0000 mg | Freq: Once | INTRAMUSCULAR | Status: AC
  Administered 2023-07-14: 10 mg via INTRAMUSCULAR

## 2023-07-14 NOTE — ED Triage Notes (Signed)
Pt reports having neck pain on left side for a few weeks. Reports painful with rotating head. Denies lifting, injuries. Voltaren and lidocaine cream that haven't helped. Took tylenol arthritis that didn't help either.  Had carotid catheterization done in October.

## 2023-07-14 NOTE — Discharge Instructions (Signed)
We gave you a shot of decadron here, this will help with inflammation.  Apply heat and perform gentle range of motion exercises to your neck (20 minutes on 20 minutes off) to improve pain.  Continue voltaren and lidocaine patches as needed.  Continue tylenol as needed.  If symptoms worsen or fail to improve over the weekend, return to urgent care. For severe symptoms, go to ER. Schedule appointment for follow-up with PCP for next week. Nice to meet you! I hope you feel better soon!

## 2023-07-14 NOTE — ED Provider Notes (Signed)
[name]    CSN: [id number] Arrival date & time: 07/14/23  1856      History   Chief Complaint Chief Complaint  Patient presents with   Neck Pain    HPI [name] is a 87 y.o. male.   [name] is a 87 y.o. male presenting with daughter at bedside who contributes to the history for chief complaint of Neck Pain to the left neck that started approximately 1.5-2 weeks ago. Pain has gradually worsened over the last 1-2 days and has become persistent. Pain to the left lateral/posterior neck is triggered with head movement from left to right and up/down. Pain improves slightly with heat temporarily as well as use of tylenol. Denies pain to the anterior neck, recent trauma/injuries to area of tenderness, heart palpitations, rash, dizziness, headache, fevers, chills, N/VD, chest pain, and pain to the left arm. History of atrial fibrillation, reports compliance with anticoagulant (Eloquis). Tylenol helps slightly with pain as stated above.    Neck Pain   Past Medical History:  Diagnosis Date   Arthritis    knees,back   Atrial fibrillation (HCC)    CAD (coronary artery disease) CARDIOLOGIST-  DR HOCHREIN   previous myocardial infarction in 2001 with 90% circumflex lesion treated with PTCA. The LAD had 70-80% stenosis which was treawted with angioplasty. Most recent catheterization in 2003 demonstrated the LAD at 30% mid stenosis, circumflex 60-70% stenosis in the mid AV grove, and 40% stenosis in the previously stented area. The right coronary artery had 30-40% stenosis in the mid portion.    Chronic diastolic (congestive) heart failure (HCC)    CKD (chronic kidney disease) stage 3, GFR 30-59 ml/min (HCC)    GERD (gastroesophageal reflux disease)    History of kidney stones    Hypertension    Hypothyroidism    Left ureteral stone    Pre-diabetes    S/P mitral valve clip implantation 03/25/2022   s/p TEER wtih two XTW MitraClips by Dr. Excell Seltzer and Lynnette Caffey    Severe mitral regurgitation    Skin cancer    face    Patient Active Problem List   Diagnosis Date Noted   Carotid stenosis 05/09/2023   Symptomatic carotid artery stenosis, right 05/09/2023   Amaurosis fugax of right eye 04/30/2023   TIA (transient ischemic attack) 04/29/2023   CAD (coronary artery disease) 03/25/2022   S/P mitral valve clip implantation 03/25/2022   Chronic atrial fibrillation (HCC) 12/20/2021   Severe mitral regurgitation    Essential hypertension 12/02/2021   Dyslipidemia 12/02/2021   Overweight (BMI 25.0-29.9) 10/03/2018   Coronary atherosclerosis 01/05/2010   GERD 01/05/2010   Arthropathy 01/05/2010   HYPERCHOLESTEROLEMIA-PURE 03/06/2009    Past Surgical History:  Procedure Laterality Date   APPENDECTOMY  as teen   BUBBLE STUDY  12/11/2021   Procedure: BUBBLE STUDY;  Surgeon: Parke Poisson, MD;  Location: [facility] ENDOSCOPY;  Service: Cardiology;;   CARDIAC CATHETERIZATION  03-29-2000   dr hochrein   Nonobstuctive CAD /   CFX patent stent and LAD diffuse luminal irregularities including mid segment previously been angioplastied   CARDIOVASCULAR STRESS TEST  05-09-2015   dr hochrein   low risk perfusion study/  no reversible ischemia/  normal LV function and wall motion , ef 63%   CARDIOVERSION N/A 04/16/2022   Procedure: CARDIOVERSION;  Surgeon: Sande Rives, MD;  Location: [facility] ENDOSCOPY;  Service: Cardiovascular;  Laterality: N/A;   CATARACT EXTRACTION W/ INTRAOCULAR LENS  IMPLANT, BILATERAL  left 05-06-2015/  right 06-10-2015   CORONARY ANGIOPLASTY WITH STENT PLACEMENT  01-21-2000  dr hochrein   PTCA to pLAD/  PTCA and BMS x1 to mid LCFX/  normal LVSF   CORONARY ANGIOPLASTY WITH STENT PLACEMENT  01-10-2002  dr Chales Abrahams   PTCA and BMS to mid AV LCFX (in-stent restenosis)/  diffuse 30-40% mRCA ,  mLAD diffuse 30%,  normal LVSF, ef 65%   CYSTOSCOPY/RETROGRADE/URETEROSCOPY/STONE EXTRACTION WITH BASKET Left 05/23/2015   Procedure: CYSTOSCOPY, FULGERATION  OF PROSTATE,  LEFT URETEROSCOPY, JJ STENT PLACEMENT;  Surgeon: Jerilee Field, MD;  Location: [facility];  Service: Urology;  Laterality: Left;   ELECTROPHYSIOLOGIC STUDY  03-30-2000   dr Graciela Husbands   EXTRACORPOREAL SHOCK WAVE LITHOTRIPSY Left 10-30-2012   EXTRACORPOREAL SHOCK WAVE LITHOTRIPSY Right 11/16/2021   Procedure: EXTRACORPOREAL SHOCK WAVE LITHOTRIPSY (ESWL);  Surgeon: Marcine Matar, MD;  Location: [facility];  Service: Urology;  Laterality: Right;   HOLMIUM LASER APPLICATION Left 05/23/2015   Procedure: WITH HOLMIUM LASER LITHOTRIPSY;  Surgeon: Jerilee Field, MD;  Location: [facility];  Service: Urology;  Laterality: Left;   KNEE ARTHROSCOPY Bilateral x2 right/  left 2015   LUMBAR LAMINECTOMY/DECOMPRESSION MICRODISCECTOMY N/A 09/28/2012   Procedure: L3  -L5 DECOMPRESSION L4 - L5 FUSION WITH INSTRUMENTATION 2 LEVELS;  Surgeon: Venita Lick, MD;  Location: [facility] OR;  Service: Orthopedics;  Laterality: N/A;   RIGHT/LEFT HEART CATH AND CORONARY ANGIOGRAPHY N/A 01/27/2022   Procedure: RIGHT/LEFT HEART CATH AND CORONARY ANGIOGRAPHY;  Surgeon: Orbie Pyo, MD;  Location: [facility] INVASIVE CV LAB;  Service: Cardiovascular;  Laterality: N/A;   TEE WITHOUT CARDIOVERSION N/A 12/11/2021   Procedure: TRANSESOPHAGEAL ECHOCARDIOGRAM (TEE);  Surgeon: Parke Poisson, MD;  Location: [facility] ENDOSCOPY;  Service: Cardiology;  Laterality: N/A;   TEE WITHOUT CARDIOVERSION N/A 03/25/2022   Procedure: TRANSESOPHAGEAL ECHOCARDIOGRAM (TEE);  Surgeon: Orbie Pyo, MD;  Location: [facility] INVASIVE CV LAB;  Service: Cardiovascular;  Laterality: N/A;   TONSILLECTOMY  as child   TOTAL KNEE ARTHROPLASTY Left 10/02/2018   Procedure: TOTAL LEFT  KNEE ARTHROPLASTY;  Surgeon: Durene Romans, MD;  Location: [facility] ORS;  Service: Orthopedics;  Laterality: Left;  90 mins- Ok per Tiffany   TOTAL SHOULDER ARTHROPLASTY Right 01/14/2011   TRANSCAROTID ARTERY REVASCULARIZATION  Right 05/09/2023    Procedure: Right Transcarotid Artery Revascularization;  Surgeon: Victorino Sparrow, MD;  Location: [facility] OR;  Service: Vascular;  Laterality: Right;   TRANSCATHETER MITRAL EDGE TO EDGE REPAIR N/A 03/25/2022   Procedure: MITRAL VALVE REPAIR;  Surgeon: Orbie Pyo, MD;  Location: [facility] INVASIVE CV LAB;  Service: Cardiovascular;  Laterality: N/A;   TRANSTHORACIC ECHOCARDIOGRAM  05-21-2015   mild LVH,  ef 55-605,  grade 2 diastolic dysfunction, basal inferior hyperkinesis/  moderate AV calcification sclerosis without stenosis/  trivial MR/  moderate LAE/  mild TR/  mild RAE   ULTRASOUND GUIDANCE FOR VASCULAR ACCESS Left 05/09/2023   Procedure: ULTRASOUND GUIDANCE FOR VASCULAR ACCESS, LEFT FEMORAL VEIN;  Surgeon: Victorino Sparrow, MD;  Location: [facility] OR;  Service: Vascular;  Laterality: Left;       Home Medications    Prior to Admission medications   Medication Sig Start Date End Date Taking? Authorizing Provider  ezetimibe (ZETIA) 10 MG tablet Take 10 mg by mouth daily. 06/09/23  Yes [provider]  acetaminophen (TYLENOL) 500 MG tablet Take 500 mg by mouth every 6 (six) hours as needed for moderate pain.    [provider]  amiodarone (PACERONE) 200 MG tablet Take  1 tablet (200 mg total) by mouth daily. 12/15/22   Laurey Morale, MD  amLODipine (NORVASC) 2.5 MG tablet Take 1 tablet (2.5 mg total) by mouth daily. Patient taking differently: Take 5 mg by mouth daily. 04/01/23 06/30/23  Jacklynn Ganong, FNP  apixaban (ELIQUIS) 5 MG TABS tablet Take 1/2 tablet by mouth twice daily 06/17/23   Rollene Rotunda, MD  aspirin EC 81 MG tablet Take 1 tablet (81 mg total) by mouth daily. Swallow whole. 04/30/23   Lewie Chamber, MD  atorvastatin (LIPITOR) 80 MG tablet Take 1 tablet (80 mg total) by mouth at bedtime. 11/26/13   Rollene Rotunda, MD  Cholecalciferol (VITAMIN D) 50 MCG (2000 UT) tablet Take 2,000 Units by mouth daily.    [provider]  clopidogrel (PLAVIX) 75 MG  tablet Take 1 tablet (75 mg total) by mouth daily. 04/30/23   Lewie Chamber, MD  Cyanocobalamin (B-12) 5000 MCG CAPS Take 5,000 mcg by mouth daily.    [provider]  dapagliflozin propanediol (FARXIGA) 10 MG TABS tablet Take 1 tablet (10 mg total) by mouth daily before breakfast. 07/06/23   Laurey Morale, MD  ferrous sulfate 325 (65 FE) MG tablet Take 325 mg by mouth daily with breakfast.    [provider]  finasteride (PROSCAR) 5 MG tablet Take 5 mg by mouth daily.    [provider]  folic acid (FOLVITE) 800 MCG tablet Take 800 mcg by mouth daily.     [provider]  furosemide (LASIX) 20 MG tablet Take 1 tablet (20 mg total) by mouth every other day. 10/01/22   Rollene Rotunda, MD  levothyroxine (SYNTHROID) 25 MCG tablet Take 50 mcg by mouth daily before breakfast.    [provider]  meclizine (ANTIVERT) 25 MG tablet Take 25 mg by mouth daily as needed for dizziness.    [provider]  niacin 500 MG tablet Take 500 mg by mouth daily.    [provider]  nitroGLYCERIN (NITROSTAT) 0.4 MG SL tablet Place 1 tablet (0.4 mg total) under the tongue every 5 (five) minutes as needed for chest pain. 04/11/23   Laurey Morale, MD  pantoprazole (PROTONIX) 40 MG tablet TAKE ONE TABLET BY MOUTH EVERY DAY 05/18/23   Tonny Bollman, MD    Family History Family History  Problem Relation Age of Onset   Heart failure Mother    Prostate cancer Father     Social History Social History   Tobacco Use   Smoking status: Former    Current packs/day: 0.00    Average packs/day: 1 pack/day for 3.0 years (3.0 ttl pk-yrs)    Types: Cigarettes    Start date: 09/27/1959    Quit date: 09/26/1962    Years since quitting: 60.8   Smokeless tobacco: Never  Vaping Use   Vaping status: Never Used  Substance Use Topics   Alcohol use: No   Drug use: No     Allergies   Patient has no known allergies.   Review of Systems Review of Systems   Musculoskeletal:  Positive for neck pain.  Per HPI   Physical Exam Triage Vital Signs ED Triage Vitals [07/14/23 1913]  Encounter Vitals Group     BP (!) 149/82     Systolic BP Percentile      Diastolic BP Percentile      Pulse Rate 68     Resp 16     Temp (!) 97.4 F (36.3 C)  Temp Source Oral     SpO2 99 %     Weight      Height      Head Circumference      Peak Flow      Pain Score      Pain Loc      Pain Education      Exclude from Growth Chart    No data found.  Updated Vital Signs BP (!) 149/82 (BP Location: Right Arm)   Pulse 68   Temp (!) 97.4 F (36.3 C) (Oral)   Resp 16   SpO2 99%   Visual Acuity Right Eye Distance:   Left Eye Distance:   Bilateral Distance:    Right Eye Near:   Left Eye Near:    Bilateral Near:     Physical Exam Vitals and nursing note reviewed.  Constitutional:      Appearance: He is not ill-appearing or toxic-appearing.  HENT:     Head: Normocephalic and atraumatic.     Right Ear: Hearing, tympanic membrane, ear canal and external ear normal.     Left Ear: Hearing, tympanic membrane, ear canal and external ear normal.     Nose: Nose normal.     Mouth/Throat:     Lips: Pink.     Mouth: Mucous membranes are moist. No injury.     Tongue: No lesions. Tongue does not deviate from midline.     Palate: No mass and lesions.     Pharynx: Oropharynx is clear. Uvula midline. No pharyngeal swelling, oropharyngeal exudate, posterior oropharyngeal erythema or uvula swelling.     Tonsils: No tonsillar exudate or tonsillar abscesses.  Eyes:     General: Lids are normal. Vision grossly intact. Gaze aligned appropriately.     Extraocular Movements: Extraocular movements intact.     Conjunctiva/sclera: Conjunctivae normal.  Neck:     Trachea: Trachea and phonation normal.     Comments: No rash to overlying skin.  Cardiovascular:     Rate and Rhythm: Normal rate and regular rhythm.     Heart sounds: Normal heart sounds, S1 normal  and S2 normal.  Pulmonary:     Effort: Pulmonary effort is normal. No respiratory distress.     Breath sounds: Normal breath sounds and air entry.  Musculoskeletal:     Cervical back: Neck supple. No edema, erythema, signs of trauma, rigidity, torticollis or crepitus. Pain with movement and muscular tenderness (Tender to multiple points of palpation over the left trapezius muscle and left cervical paraspinous muscles.) present. No spinous process tenderness. Decreased range of motion (side to side secondary to pain).  Lymphadenopathy:     Cervical: No cervical adenopathy.  Skin:    General: Skin is warm and dry.     Capillary Refill: Capillary refill takes less than 2 seconds.     Findings: No rash.  Neurological:     General: No focal deficit present.     Mental Status: He is alert and oriented to person, place, and time. Mental status is at baseline.     Cranial Nerves: No dysarthria or facial asymmetry.  Psychiatric:        Mood and Affect: Mood normal.        Speech: Speech normal.        Behavior: Behavior normal.        Thought Content: Thought content normal.        Judgment: Judgment normal.      UC Treatments / Results  Labs (all  labs ordered are listed, but only abnormal results are displayed) Labs Reviewed - No data to display  EKG   Radiology No results found.  Procedures Procedures (including critical care time)  Medications Ordered in UC Medications  dexamethasone (DECADRON) injection 10 mg (10 mg Intramuscular Given 07/14/23 2000)    Initial Impression / Assessment and Plan / UC Course  I have reviewed the triage vital signs and the nursing notes.  Pertinent labs & imaging results that were available during my care of the patient were reviewed by me and considered in my medical decision making (see chart for details).   1. Spasm of left trapezius muscle Presentation consistent with left trapezius muscle spasm and tenderness.  Low suspicion for acute  bony abnormality given atraumatic nature of pain, therefore deferred imaging of the neck.  Patient is not a candidate for NSAIDs due to age, chronic kidney disease, and use of anticoagulation. I hesitate to place him on a muscle relaxer, even baclofen, given fall risk and age. Will manage this with dexamethasone 10mg  IM to reduce inflammation.  Recommend heat 20 minutes on 20 minutes off with barrier to prevent heat related rash.  Gentle ROM exercises. Tylenol as needed for pain. Continue voltaren gel and lidocaine patches as needed. Follow-up with PCP as needed should symptoms persist or fail to improve.  Counseled patient on potential for adverse effects with medications prescribed/recommended today, strict ER and return-to-clinic precautions discussed, patient verbalized understanding.    Final Clinical Impressions(s) / UC Diagnoses   Final diagnoses:  Spasm of left trapezius muscle     Discharge Instructions      We gave you a shot of decadron here, this will help with inflammation.  Apply heat and perform gentle range of motion exercises to your neck (20 minutes on 20 minutes off) to improve pain.  Continue voltaren and lidocaine patches as needed.  Continue tylenol as needed.  If symptoms worsen or fail to improve over the weekend, return to urgent care. For severe symptoms, go to ER. Schedule appointment for follow-up with PCP for next week. Nice to meet you! I hope you feel better soon!     ED Prescriptions   None    PDMP not reviewed this encounter.   Carlisle Beers, [place] 07/19/23 1545

## 2023-07-25 NOTE — Progress Notes (Unsigned)
Office Note    HPI: [name] is a 87 y.o. (09/21/1935) male presenting in follow-up status post right TCAR for symptomatic carotid stenosis 05/09/2023 - 10x86mm stent. Symptom right eye Hollenhorst plaque.  On exam, [name] was doing well.  He had no complaints.  Denied symptoms of TIA, stroke, amaurosis. Denied wound healing concerns.  Medications include Eliquis, aspirin, Lipitor 80 mg  Past Medical History:  Diagnosis Date   Arthritis    knees,back   Atrial fibrillation (HCC)    CAD (coronary artery disease) CARDIOLOGIST-  DR HOCHREIN   previous myocardial infarction in 2001 with 90% circumflex lesion treated with PTCA. The LAD had 70-80% stenosis which was treawted with angioplasty. Most recent catheterization in 2003 demonstrated the LAD at 30% mid stenosis, circumflex 60-70% stenosis in the mid AV grove, and 40% stenosis in the previously stented area. The right coronary artery had 30-40% stenosis in the mid portion.    Chronic diastolic (congestive) heart failure (HCC)    CKD (chronic kidney disease) stage 3, GFR 30-59 ml/min (HCC)    GERD (gastroesophageal reflux disease)    History of kidney stones    Hypertension    Hypothyroidism    Left ureteral stone    Pre-diabetes    S/P mitral valve clip implantation 03/25/2022   s/p TEER wtih two XTW MitraClips by Dr. Excell Seltzer and Lynnette Caffey   Severe mitral regurgitation    Skin cancer    face    Past Surgical History:  Procedure Laterality Date   APPENDECTOMY  as teen   BUBBLE STUDY  12/11/2021   Procedure: BUBBLE STUDY;  Surgeon: Parke Poisson, MD;  Location: [facility] ENDOSCOPY;  Service: Cardiology;;   CARDIAC CATHETERIZATION  03-29-2000   dr hochrein   Nonobstuctive CAD /   CFX patent stent and LAD diffuse luminal irregularities including mid segment previously been angioplastied   CARDIOVASCULAR STRESS TEST  05-09-2015   dr hochrein   low risk perfusion study/  no reversible ischemia/  normal LV function and wall motion ,  ef 63%   CARDIOVERSION N/A 04/16/2022   Procedure: CARDIOVERSION;  Surgeon: Sande Rives, MD;  Location: [facility] ENDOSCOPY;  Service: Cardiovascular;  Laterality: N/A;   CATARACT EXTRACTION W/ INTRAOCULAR LENS  IMPLANT, BILATERAL  left 05-06-2015/  right 06-10-2015   CORONARY ANGIOPLASTY WITH STENT PLACEMENT  01-21-2000  dr hochrein   PTCA to pLAD/  PTCA and BMS x1 to mid LCFX/  normal LVSF   CORONARY ANGIOPLASTY WITH STENT PLACEMENT  01-10-2002  dr Chales Abrahams   PTCA and BMS to mid AV LCFX (in-stent restenosis)/  diffuse 30-40% mRCA ,  mLAD diffuse 30%,  normal LVSF, ef 65%   CYSTOSCOPY/RETROGRADE/URETEROSCOPY/STONE EXTRACTION WITH BASKET Left 05/23/2015   Procedure: CYSTOSCOPY, FULGERATION OF PROSTATE,  LEFT URETEROSCOPY, JJ STENT PLACEMENT;  Surgeon: Jerilee Field, MD;  Location: [facility];  Service: Urology;  Laterality: Left;   ELECTROPHYSIOLOGIC STUDY  03-30-2000   dr Graciela Husbands   EXTRACORPOREAL SHOCK WAVE LITHOTRIPSY Left 10-30-2012   EXTRACORPOREAL SHOCK WAVE LITHOTRIPSY Right 11/16/2021   Procedure: EXTRACORPOREAL SHOCK WAVE LITHOTRIPSY (ESWL);  Surgeon: Marcine Matar, MD;  Location: [facility];  Service: Urology;  Laterality: Right;   HOLMIUM LASER APPLICATION Left 05/23/2015   Procedure: WITH HOLMIUM LASER LITHOTRIPSY;  Surgeon: Jerilee Field, MD;  Location: [facility];  Service: Urology;  Laterality: Left;   KNEE ARTHROSCOPY Bilateral x2 right/  left 2015   LUMBAR LAMINECTOMY/DECOMPRESSION MICRODISCECTOMY N/A 09/28/2012   Procedure: L3  -  L5 DECOMPRESSION L4 - L5 FUSION WITH INSTRUMENTATION 2 LEVELS;  Surgeon: Venita Lick, MD;  Location: [facility] OR;  Service: Orthopedics;  Laterality: N/A;   RIGHT/LEFT HEART CATH AND CORONARY ANGIOGRAPHY N/A 01/27/2022   Procedure: RIGHT/LEFT HEART CATH AND CORONARY ANGIOGRAPHY;  Surgeon: Orbie Pyo, MD;  Location: [facility] INVASIVE CV LAB;  Service: Cardiovascular;  Laterality: N/A;   TEE WITHOUT  CARDIOVERSION N/A 12/11/2021   Procedure: TRANSESOPHAGEAL ECHOCARDIOGRAM (TEE);  Surgeon: Parke Poisson, MD;  Location: [facility] ENDOSCOPY;  Service: Cardiology;  Laterality: N/A;   TEE WITHOUT CARDIOVERSION N/A 03/25/2022   Procedure: TRANSESOPHAGEAL ECHOCARDIOGRAM (TEE);  Surgeon: Orbie Pyo, MD;  Location: [facility] INVASIVE CV LAB;  Service: Cardiovascular;  Laterality: N/A;   TONSILLECTOMY  as child   TOTAL KNEE ARTHROPLASTY Left 10/02/2018   Procedure: TOTAL LEFT  KNEE ARTHROPLASTY;  Surgeon: Durene Romans, MD;  Location: [facility] ORS;  Service: Orthopedics;  Laterality: Left;  90 mins- Ok per Tiffany   TOTAL SHOULDER ARTHROPLASTY Right 01/14/2011   TRANSCAROTID ARTERY REVASCULARIZATION  Right 05/09/2023   Procedure: Right Transcarotid Artery Revascularization;  Surgeon: Victorino Sparrow, MD;  Location: [facility] OR;  Service: Vascular;  Laterality: Right;   TRANSCATHETER MITRAL EDGE TO EDGE REPAIR N/A 03/25/2022   Procedure: MITRAL VALVE REPAIR;  Surgeon: Orbie Pyo, MD;  Location: [facility] INVASIVE CV LAB;  Service: Cardiovascular;  Laterality: N/A;   TRANSTHORACIC ECHOCARDIOGRAM  05-21-2015   mild LVH,  ef 55-605,  grade 2 diastolic dysfunction, basal inferior hyperkinesis/  moderate AV calcification sclerosis without stenosis/  trivial MR/  moderate LAE/  mild TR/  mild RAE   ULTRASOUND GUIDANCE FOR VASCULAR ACCESS Left 05/09/2023   Procedure: ULTRASOUND GUIDANCE FOR VASCULAR ACCESS, LEFT FEMORAL VEIN;  Surgeon: Victorino Sparrow, MD;  Location: [facility] OR;  Service: Vascular;  Laterality: Left;    Social History   Socioeconomic History   Marital status: Married    Spouse name: Not on file   Number of children: 2   Years of education: Not on file   Highest education level: Not on file  Occupational History   Occupation: retired  Tobacco Use   Smoking status: Former    Current packs/day: 0.00    Average packs/day: 1 pack/day for 3.0 years (3.0 ttl pk-yrs)    Types: Cigarettes    Start date: 09/27/1959     Quit date: 09/26/1962    Years since quitting: 60.8   Smokeless tobacco: Never  Vaping Use   Vaping status: Never Used  Substance and Sexual Activity   Alcohol use: No   Drug use: No   Sexual activity: Not Currently  Other Topics Concern   Not on file  Social History Narrative   Lives at home with wife.  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     Social Drivers of Corporate investment banker Strain: Not on file  Food Insecurity: No Food Insecurity (04/29/2023)   Hunger Vital Sign    Worried About Running Out of Food in the Last Year: Never true    Ran Out of Food in the Last Year: Never true  Transportation Needs: No Transportation Needs (04/29/2023)   PRAPARE - Administrator, Civil Service (Medical): No    Lack of Transportation (Non-Medical): No  Physical Activity: Not on file  Stress: Not on file  Social Connections: Not on file  Intimate Partner Violence: Not At Risk (04/29/2023)   Humiliation, Afraid, Rape, and Kick questionnaire    Fear of Current or  Ex-Partner: No    Emotionally Abused: No    Physically Abused: No    Sexually Abused: No   Family History  Problem Relation Age of Onset   Heart failure Mother    Prostate cancer Father     Current Outpatient Medications  Medication Sig Dispense Refill   acetaminophen (TYLENOL) 500 MG tablet Take 500 mg by mouth every 6 (six) hours as needed for moderate pain.     amiodarone (PACERONE) 200 MG tablet Take 1 tablet (200 mg total) by mouth daily. 90 tablet 3   amLODipine (NORVASC) 2.5 MG tablet Take 1 tablet (2.5 mg total) by mouth daily. (Patient taking differently: Take 5 mg by mouth daily.) 30 tablet 3   apixaban (ELIQUIS) 5 MG TABS tablet Take 1/2 tablet by mouth twice daily 90 tablet 1   aspirin EC 81 MG tablet Take 1 tablet (81 mg total) by mouth daily. Swallow whole.     atorvastatin (LIPITOR) 80 MG tablet Take 1 tablet (80 mg total) by mouth at bedtime. 90 tablet 3   Cholecalciferol (VITAMIN D) 50 MCG (2000 UT)  tablet Take 2,000 Units by mouth daily.     clopidogrel (PLAVIX) 75 MG tablet Take 1 tablet (75 mg total) by mouth daily. 30 tablet 3   Cyanocobalamin (B-12) 5000 MCG CAPS Take 5,000 mcg by mouth daily.     dapagliflozin propanediol (FARXIGA) 10 MG TABS tablet Take 1 tablet (10 mg total) by mouth daily before breakfast. 90 tablet 3   ezetimibe (ZETIA) 10 MG tablet Take 10 mg by mouth daily.     ferrous sulfate 325 (65 FE) MG tablet Take 325 mg by mouth daily with breakfast.     finasteride (PROSCAR) 5 MG tablet Take 5 mg by mouth daily.     folic acid (FOLVITE) 800 MCG tablet Take 800 mcg by mouth daily.      furosemide (LASIX) 20 MG tablet Take 1 tablet (20 mg total) by mouth every other day. 60 tablet 2   levothyroxine (SYNTHROID) 25 MCG tablet Take 50 mcg by mouth daily before breakfast.     meclizine (ANTIVERT) 25 MG tablet Take 25 mg by mouth daily as needed for dizziness.     niacin 500 MG tablet Take 500 mg by mouth daily.     nitroGLYCERIN (NITROSTAT) 0.4 MG SL tablet Place 1 tablet (0.4 mg total) under the tongue every 5 (five) minutes as needed for chest pain. 25 tablet 3   pantoprazole (PROTONIX) 40 MG tablet TAKE ONE TABLET BY MOUTH EVERY DAY 90 tablet 3   No current facility-administered medications for this visit.    No Known Allergies   REVIEW OF SYSTEMS:  [X]  denotes positive finding, [ ]  denotes negative finding Cardiac  Comments:  Chest pain or chest pressure:    Shortness of breath upon exertion:    Short of breath when lying flat:    Irregular heart rhythm:        Vascular    Pain in calf, thigh, or hip brought on by ambulation:    Pain in feet at night that wakes you up from your sleep:     Blood clot in your veins:    Leg swelling:         Pulmonary    Oxygen at home:    Productive cough:     Wheezing:         Neurologic    Sudden weakness in arms or legs:     Sudden  numbness in arms or legs:     Sudden onset of difficulty speaking or slurred speech:     Temporary loss of vision in one eye:     Problems with dizziness:         Gastrointestinal    Blood in stool:     Vomited blood:         Genitourinary    Burning when urinating:     Blood in urine:        Psychiatric    Major depression:         Hematologic    Bleeding problems:    Problems with blood clotting too easily:        Skin    Rashes or ulcers:        Constitutional    Fever or chills:      PHYSICAL EXAMINATION:  There were no vitals filed for this visit.  General:  WDWN in NAD; vital signs documented above Gait: Not observed HENT: WNL, normocephalic, neck incision well-healed Pulmonary: normal non-labored breathing , without wheezing Cardiac: regular HR Abdomen: soft, NT, no masses Skin: without rashes Vascular Exam/Pulses:  Right Left  Radial 2+ (normal) 2+ (normal)                       Extremities: without ischemic changes, without Gangrene , without cellulitis; without open wounds;  Musculoskeletal: no muscle wasting or atrophy  Neurologic: A&O X 3;  No focal weakness or paresthesias are detected Psychiatric:  The pt has Normal affect.   Non-Invasive Vascular Imaging:   Summary:  Right Carotid: Velocities in the right ICA are consistent with a 1-39%  stenosis.                Non-hemodynamically significant plaque <50% noted in the  CCA.                Patent Right ICA stent.   Left Carotid: Velocities in the left ICA are consistent with a 1-39%  stenosis.               Non-hemodynamically significant plaque <50% noted in the  CCA.   Vertebrals: Bilateral vertebral arteries demonstrate antegrade flow.  Subclavians: Normal flow hemodynamics were seen in bilateral subclavian               arteries.     ASSESSMENT/PLAN: [name] is a 87 y.o. male presenting for follow-up status post right-sided ICA stenting for symptomatic ICA stenosis.  Rennie has been doing well since discharge. Remains asymptomatic. Carotid duplex  ultrasound reviewed demonstrating widely patent right-sided internal carotid artery stent.  The left-sided internal carotid arteries were also widely patent.  My plan is to see him again in 9 months. He was asked to continue his current medication regimen and call if any questions or concerns arise.   Victorino Sparrow, MD [facility] [phone]

## 2023-07-28 ENCOUNTER — Ambulatory Visit (INDEPENDENT_AMBULATORY_CARE_PROVIDER_SITE_OTHER): Payer: Medicare HMO | Admitting: Vascular Surgery

## 2023-07-28 ENCOUNTER — Ambulatory Visit (HOSPITAL_COMMUNITY)
Admission: RE | Admit: 2023-07-28 | Discharge: 2023-07-28 | Disposition: A | Discharge status: Home / Self Care | Payer: Medicare HMO | Source: Ambulatory Visit | Attending: Vascular Surgery | Admitting: Vascular Surgery

## 2023-07-28 VITALS — BP 150/80 | HR 57 | Temp 98.2°F | Wt 169.0 lb

## 2023-07-28 DIAGNOSIS — I6521 Occlusion and stenosis of right carotid artery: Secondary | ICD-10-CM | POA: Insufficient documentation

## 2023-07-28 DIAGNOSIS — Z9862 Peripheral vascular angioplasty status: Secondary | ICD-10-CM

## 2023-07-28 DIAGNOSIS — I6523 Occlusion and stenosis of bilateral carotid arteries: Secondary | ICD-10-CM

## 2023-08-01 ENCOUNTER — Emergency Department (HOSPITAL_COMMUNITY): Payer: Medicare HMO

## 2023-08-01 ENCOUNTER — Encounter (HOSPITAL_COMMUNITY): Payer: Self-pay

## 2023-08-01 ENCOUNTER — Inpatient Hospital Stay (HOSPITAL_COMMUNITY)
Admission: EM | Admit: 2023-08-01 | Discharge: 2023-08-09 | DRG: 184 | Disposition: A | Discharge status: Skilled Nursing Facility | Payer: Medicare HMO | Attending: Internal Medicine | Admitting: Internal Medicine

## 2023-08-01 DIAGNOSIS — M48061 Spinal stenosis, lumbar region without neurogenic claudication: Secondary | ICD-10-CM | POA: Diagnosis not present

## 2023-08-01 DIAGNOSIS — Z96653 Presence of artificial knee joint, bilateral: Secondary | ICD-10-CM | POA: Diagnosis present

## 2023-08-01 DIAGNOSIS — K869 Disease of pancreas, unspecified: Secondary | ICD-10-CM | POA: Diagnosis present

## 2023-08-01 DIAGNOSIS — Y92009 Unspecified place in unspecified non-institutional (private) residence as the place of occurrence of the external cause: Secondary | ICD-10-CM

## 2023-08-01 DIAGNOSIS — D72829 Elevated white blood cell count, unspecified: Secondary | ICD-10-CM | POA: Diagnosis present

## 2023-08-01 DIAGNOSIS — Y92019 Unspecified place in single-family (private) house as the place of occurrence of the external cause: Secondary | ICD-10-CM

## 2023-08-01 DIAGNOSIS — Z85828 Personal history of other malignant neoplasm of skin: Secondary | ICD-10-CM

## 2023-08-01 DIAGNOSIS — K8689 Other specified diseases of pancreas: Secondary | ICD-10-CM | POA: Diagnosis not present

## 2023-08-01 DIAGNOSIS — I6523 Occlusion and stenosis of bilateral carotid arteries: Secondary | ICD-10-CM | POA: Diagnosis not present

## 2023-08-01 DIAGNOSIS — K5900 Constipation, unspecified: Secondary | ICD-10-CM | POA: Diagnosis not present

## 2023-08-01 DIAGNOSIS — S2249XA Multiple fractures of ribs, unspecified side, initial encounter for closed fracture: Principal | ICD-10-CM | POA: Diagnosis present

## 2023-08-01 DIAGNOSIS — K219 Gastro-esophageal reflux disease without esophagitis: Secondary | ICD-10-CM | POA: Diagnosis present

## 2023-08-01 DIAGNOSIS — Z7901 Long term (current) use of anticoagulants: Secondary | ICD-10-CM

## 2023-08-01 DIAGNOSIS — W19XXXA Unspecified fall, initial encounter: Secondary | ICD-10-CM

## 2023-08-01 DIAGNOSIS — Z043 Encounter for examination and observation following other accident: Secondary | ICD-10-CM | POA: Diagnosis not present

## 2023-08-01 DIAGNOSIS — S2242XA Multiple fractures of ribs, left side, initial encounter for closed fracture: Secondary | ICD-10-CM | POA: Diagnosis not present

## 2023-08-01 DIAGNOSIS — Z96611 Presence of right artificial shoulder joint: Secondary | ICD-10-CM | POA: Diagnosis not present

## 2023-08-01 DIAGNOSIS — S2241XA Multiple fractures of ribs, right side, initial encounter for closed fracture: Secondary | ICD-10-CM | POA: Diagnosis not present

## 2023-08-01 DIAGNOSIS — Z7982 Long term (current) use of aspirin: Secondary | ICD-10-CM

## 2023-08-01 DIAGNOSIS — R0781 Pleurodynia: Secondary | ICD-10-CM | POA: Diagnosis not present

## 2023-08-01 DIAGNOSIS — Z9181 History of falling: Secondary | ICD-10-CM

## 2023-08-01 DIAGNOSIS — M25511 Pain in right shoulder: Secondary | ICD-10-CM | POA: Diagnosis present

## 2023-08-01 DIAGNOSIS — S0990XA Unspecified injury of head, initial encounter: Secondary | ICD-10-CM | POA: Diagnosis not present

## 2023-08-01 DIAGNOSIS — E039 Hypothyroidism, unspecified: Secondary | ICD-10-CM | POA: Diagnosis present

## 2023-08-01 DIAGNOSIS — I480 Paroxysmal atrial fibrillation: Secondary | ICD-10-CM | POA: Diagnosis present

## 2023-08-01 DIAGNOSIS — Z8249 Family history of ischemic heart disease and other diseases of the circulatory system: Secondary | ICD-10-CM

## 2023-08-01 DIAGNOSIS — Z751 Person awaiting admission to adequate facility elsewhere: Secondary | ICD-10-CM

## 2023-08-01 DIAGNOSIS — I2510 Atherosclerotic heart disease of native coronary artery without angina pectoris: Secondary | ICD-10-CM | POA: Diagnosis present

## 2023-08-01 DIAGNOSIS — Z955 Presence of coronary angioplasty implant and graft: Secondary | ICD-10-CM

## 2023-08-01 DIAGNOSIS — I252 Old myocardial infarction: Secondary | ICD-10-CM

## 2023-08-01 DIAGNOSIS — I340 Nonrheumatic mitral (valve) insufficiency: Secondary | ICD-10-CM | POA: Diagnosis present

## 2023-08-01 DIAGNOSIS — I10 Essential (primary) hypertension: Secondary | ICD-10-CM | POA: Diagnosis present

## 2023-08-01 DIAGNOSIS — Z87891 Personal history of nicotine dependence: Secondary | ICD-10-CM

## 2023-08-01 DIAGNOSIS — M25552 Pain in left hip: Secondary | ICD-10-CM | POA: Diagnosis not present

## 2023-08-01 DIAGNOSIS — S199XXA Unspecified injury of neck, initial encounter: Secondary | ICD-10-CM | POA: Diagnosis not present

## 2023-08-01 DIAGNOSIS — Z87442 Personal history of urinary calculi: Secondary | ICD-10-CM

## 2023-08-01 DIAGNOSIS — D72828 Other elevated white blood cell count: Secondary | ICD-10-CM | POA: Diagnosis present

## 2023-08-01 DIAGNOSIS — Z8042 Family history of malignant neoplasm of prostate: Secondary | ICD-10-CM

## 2023-08-01 DIAGNOSIS — E785 Hyperlipidemia, unspecified: Secondary | ICD-10-CM | POA: Diagnosis present

## 2023-08-01 DIAGNOSIS — K8020 Calculus of gallbladder without cholecystitis without obstruction: Secondary | ICD-10-CM | POA: Diagnosis not present

## 2023-08-01 DIAGNOSIS — I700 Atherosclerosis of aorta: Secondary | ICD-10-CM | POA: Diagnosis not present

## 2023-08-01 DIAGNOSIS — N1832 Chronic kidney disease, stage 3b: Secondary | ICD-10-CM | POA: Diagnosis present

## 2023-08-01 DIAGNOSIS — M19011 Primary osteoarthritis, right shoulder: Secondary | ICD-10-CM | POA: Diagnosis not present

## 2023-08-01 DIAGNOSIS — Y9389 Activity, other specified: Secondary | ICD-10-CM

## 2023-08-01 DIAGNOSIS — I130 Hypertensive heart and chronic kidney disease with heart failure and stage 1 through stage 4 chronic kidney disease, or unspecified chronic kidney disease: Secondary | ICD-10-CM | POA: Diagnosis present

## 2023-08-01 DIAGNOSIS — W11XXXA Fall on and from ladder, initial encounter: Secondary | ICD-10-CM | POA: Diagnosis present

## 2023-08-01 DIAGNOSIS — M4316 Spondylolisthesis, lumbar region: Secondary | ICD-10-CM | POA: Diagnosis not present

## 2023-08-01 DIAGNOSIS — N400 Enlarged prostate without lower urinary tract symptoms: Secondary | ICD-10-CM | POA: Diagnosis present

## 2023-08-01 DIAGNOSIS — Z981 Arthrodesis status: Secondary | ICD-10-CM

## 2023-08-01 DIAGNOSIS — R9089 Other abnormal findings on diagnostic imaging of central nervous system: Secondary | ICD-10-CM | POA: Diagnosis not present

## 2023-08-01 DIAGNOSIS — Z79899 Other long term (current) drug therapy: Secondary | ICD-10-CM

## 2023-08-01 DIAGNOSIS — Z79890 Hormone replacement therapy (postmenopausal): Secondary | ICD-10-CM

## 2023-08-01 DIAGNOSIS — R251 Tremor, unspecified: Secondary | ICD-10-CM | POA: Diagnosis present

## 2023-08-01 DIAGNOSIS — S32592A Other specified fracture of left pubis, initial encounter for closed fracture: Secondary | ICD-10-CM | POA: Diagnosis present

## 2023-08-01 DIAGNOSIS — M47816 Spondylosis without myelopathy or radiculopathy, lumbar region: Secondary | ICD-10-CM | POA: Diagnosis not present

## 2023-08-01 DIAGNOSIS — I5032 Chronic diastolic (congestive) heart failure: Secondary | ICD-10-CM | POA: Diagnosis present

## 2023-08-01 LAB — PROTIME-INR
INR: 1.2 (ref 0.8–1.2)
Prothrombin Time: 14.9 s (ref 11.4–15.2)

## 2023-08-01 LAB — CBC WITH DIFFERENTIAL/PLATELET
Abs Immature Granulocytes: 0.1 10*3/uL — ABNORMAL HIGH (ref 0.00–0.07)
Basophils Absolute: 0.1 10*3/uL (ref 0.0–0.1)
Basophils Relative: 0 %
Eosinophils Absolute: 0.1 10*3/uL (ref 0.0–0.5)
Eosinophils Relative: 0 %
HCT: 43.2 % (ref 39.0–52.0)
Hemoglobin: 13.8 g/dL (ref 13.0–17.0)
Immature Granulocytes: 1 %
Lymphocytes Relative: 8 %
Lymphs Abs: 1.1 10*3/uL (ref 0.7–4.0)
MCH: 31.2 pg (ref 26.0–34.0)
MCHC: 31.9 g/dL (ref 30.0–36.0)
MCV: 97.5 fL (ref 80.0–100.0)
Monocytes Absolute: 0.8 10*3/uL (ref 0.1–1.0)
Monocytes Relative: 6 %
Neutro Abs: 11.7 10*3/uL — ABNORMAL HIGH (ref 1.7–7.7)
Neutrophils Relative %: 85 %
Platelets: 152 10*3/uL (ref 150–400)
RBC: 4.43 MIL/uL (ref 4.22–5.81)
RDW: 14.6 % (ref 11.5–15.5)
WBC: 13.9 10*3/uL — ABNORMAL HIGH (ref 4.0–10.5)
nRBC: 0 % (ref 0.0–0.2)

## 2023-08-01 LAB — I-STAT CHEM 8, ED
BUN: 26 mg/dL — ABNORMAL HIGH (ref 8–23)
Calcium, Ion: 1.18 mmol/L (ref 1.15–1.40)
Chloride: 106 mmol/L (ref 98–111)
Creatinine, Ser: 2.1 mg/dL — ABNORMAL HIGH (ref 0.61–1.24)
Glucose, Bld: 124 mg/dL — ABNORMAL HIGH (ref 70–99)
HCT: 43 % (ref 39.0–52.0)
Hemoglobin: 14.6 g/dL (ref 13.0–17.0)
Potassium: 4.3 mmol/L (ref 3.5–5.1)
Sodium: 141 mmol/L (ref 135–145)
TCO2: 25 mmol/L (ref 22–32)

## 2023-08-01 LAB — COMPREHENSIVE METABOLIC PANEL
ALT: 20 U/L (ref 0–44)
AST: 21 U/L (ref 15–41)
Albumin: 3.8 g/dL (ref 3.5–5.0)
Alkaline Phosphatase: 54 U/L (ref 38–126)
Anion gap: 12 (ref 5–15)
BUN: 25 mg/dL — ABNORMAL HIGH (ref 8–23)
CO2: 24 mmol/L (ref 22–32)
Calcium: 9.4 mg/dL (ref 8.9–10.3)
Chloride: 106 mmol/L (ref 98–111)
Creatinine, Ser: 1.99 mg/dL — ABNORMAL HIGH (ref 0.61–1.24)
GFR, Estimated: 32 mL/min — ABNORMAL LOW (ref >60)
Glucose, Bld: 128 mg/dL — ABNORMAL HIGH (ref 70–99)
Potassium: 4.3 mmol/L (ref 3.5–5.1)
Sodium: 142 mmol/L (ref 135–145)
Total Bilirubin: 0.9 mg/dL (ref 0.0–1.2)
Total Protein: 6.9 g/dL (ref 6.5–8.1)

## 2023-08-01 LAB — CBG MONITORING, ED: Glucose-Capillary: 122 mg/dL — ABNORMAL HIGH (ref 70–99)

## 2023-08-01 MED ORDER — FENTANYL CITRATE PF 50 MCG/ML IJ SOSY
50.0000 ug | PREFILLED_SYRINGE | Freq: Once | INTRAMUSCULAR | Status: AC
  Administered 2023-08-01: 50 ug via INTRAVENOUS
  Filled 2023-08-01: qty 1

## 2023-08-01 MED ORDER — SODIUM CHLORIDE 0.9 % IV BOLUS
500.0000 mL | Freq: Once | INTRAVENOUS | Status: AC
  Administered 2023-08-01: 500 mL via INTRAVENOUS

## 2023-08-01 MED ORDER — IOHEXOL 350 MG/ML SOLN
75.0000 mL | Freq: Once | INTRAVENOUS | Status: AC | PRN
  Administered 2023-08-01: 75 mL via INTRAVENOUS

## 2023-08-01 NOTE — ED Notes (Signed)
[place] c- collar applied .  

## 2023-08-01 NOTE — ED Provider Notes (Signed)
Patient here with fall.  On blood thinners.  Supervised resident visit.  He is having pain mostly in the left hip also right shoulder pain.  Overall patient well-appearing.  Normal vitals.  No signs of pneumothorax or pelvic fracture on x-rays.  Patient to get full trauma scans given mechanism of fall on blood thinners.  Having diffuse pain.  Overall he is got chronic CKD GFR is 32.  Will give him reduced dose of contrast for scans.  Patient handed off to oncoming ED staff with patient pending trauma scans.  Disposition per scans.  This chart was dictated using voice recognition software.  Despite best efforts to proofread,  errors can occur which can change the documentation meaning.    Virgina Norfolk, DO 08/01/23 2239

## 2023-08-01 NOTE — ED Notes (Signed)
Patient transported to CT with cardiac monitoring and RN Ryan with trauma.

## 2023-08-01 NOTE — Progress Notes (Signed)
   08/01/23 2130  Spiritual Encounters  Type of Visit Initial  Care provided to: Pt not available  Referral source Trauma page  Reason for visit Trauma  OnCall Visit No   Chaplain responded to a level two trauma. The patient, [name], was attended to by the medical team. The family member that was accompanying was providing support to the medical team.  If a chaplain is requested someone will respond.   Valerie Roys [facility]  [phone]

## 2023-08-01 NOTE — ED Notes (Signed)
Pt was seen at [facility], had some scans there and advise to come to ER for further eval. Dr. Mardene Speak at bedside and notified.

## 2023-08-01 NOTE — ED Notes (Signed)
Pt coming from triage, to RM 24 via wheelchair, NAD Noted, A&O x4

## 2023-08-01 NOTE — ED Provider Notes (Signed)
[place] EMERGENCY DEPARTMENT AT [facility] Provider Note  HPI   [name] is a 87 y.o. male patient with a PMHx of CKD hypertension hypothyroidism, diastolic heart failure, atrial fibrillation on Eliquis who is here today after fall.  Patient states that he was putting up a rain catcher, and fell down about 3 steps,.  He is complain of some right shoulder pain, some left hip pain.  He was seen at orthopedic clinic, reportedly had some x-rays done, was sent here for further evaluation.  ROS Negative except as per HPI  Medical Decision Making   Upon presentation, the patient is afebrile hemodynamically stable, has some right shoulder and left hip tenderness, distally neurovascular tact in both places, no abdominal pain no cervical thoracic or lumbar midline tenderness.  Points of access were obtained, blood pressure taken, he is stable, sore cervical collar applied  For this patient, given his scattered pain areas, and his Eliquis use, we decided to go with full trauma imaging and labs on this individual  Creatinine 2.10, patient is getting reduced dose trauma scans regardless, leukocytosis of 13.9, glucose check 122, chest x-ray shoulder x-ray and pelvis x-ray, are all negative for any acute process at this time.  Patient is attending trauma scans  CT head and CT cervical spine shows no acute process  I have transferred care of this patient to the incoming emergency medicine resident at the end of my shift. I have discussed the patient's HPI, physical exam, and workup and therapeutics with the incoming team. Please see their documentation for the remainder of care. Patient is pending CT chest abdomen pelvis and T and L-spine  If all trauma imaging is negative, would ambulate the patient, and if ambulating okay, would be consideration for discharge   No diagnosis found.  @DISPOSITION @  Rx / DC Orders ED Discharge Orders     None        Past Medical History:   Diagnosis Date   Arthritis    knees,back   Atrial fibrillation (HCC)    CAD (coronary artery disease) CARDIOLOGIST-  DR HOCHREIN   previous myocardial infarction in 2001 with 90% circumflex lesion treated with PTCA. The LAD had 70-80% stenosis which was treawted with angioplasty. Most recent catheterization in 2003 demonstrated the LAD at 30% mid stenosis, circumflex 60-70% stenosis in the mid AV grove, and 40% stenosis in the previously stented area. The right coronary artery had 30-40% stenosis in the mid portion.    Chronic diastolic (congestive) heart failure (HCC)    CKD (chronic kidney disease) stage 3, GFR 30-59 ml/min (HCC)    GERD (gastroesophageal reflux disease)    History of kidney stones    Hypertension    Hypothyroidism    Left ureteral stone    Pre-diabetes    S/P mitral valve clip implantation 03/25/2022   s/p TEER wtih two XTW MitraClips by Dr. Excell Seltzer and Lynnette Caffey   Severe mitral regurgitation    Skin cancer    face   Past Surgical History:  Procedure Laterality Date   APPENDECTOMY  as teen   BUBBLE STUDY  12/11/2021   Procedure: BUBBLE STUDY;  Surgeon: Parke Poisson, MD;  Location: [facility] ENDOSCOPY;  Service: Cardiology;;   CARDIAC CATHETERIZATION  03-29-2000   dr hochrein   Nonobstuctive CAD /   CFX patent stent and LAD diffuse luminal irregularities including mid segment previously been angioplastied   CARDIOVASCULAR STRESS TEST  05-09-2015   dr hochrein   low risk  perfusion study/  no reversible ischemia/  normal LV function and wall motion , ef 63%   CARDIOVERSION N/A 04/16/2022   Procedure: CARDIOVERSION;  Surgeon: Sande Rives, MD;  Location: [facility] ENDOSCOPY;  Service: Cardiovascular;  Laterality: N/A;   CATARACT EXTRACTION W/ INTRAOCULAR LENS  IMPLANT, BILATERAL  left 05-06-2015/  right 06-10-2015   CORONARY ANGIOPLASTY WITH STENT PLACEMENT  01-21-2000  dr hochrein   PTCA to pLAD/  PTCA and BMS x1 to mid LCFX/  normal LVSF   CORONARY ANGIOPLASTY WITH  STENT PLACEMENT  01-10-2002  dr Chales Abrahams   PTCA and BMS to mid AV LCFX (in-stent restenosis)/  diffuse 30-40% mRCA ,  mLAD diffuse 30%,  normal LVSF, ef 65%   CYSTOSCOPY/RETROGRADE/URETEROSCOPY/STONE EXTRACTION WITH BASKET Left 05/23/2015   Procedure: CYSTOSCOPY, FULGERATION OF PROSTATE,  LEFT URETEROSCOPY, JJ STENT PLACEMENT;  Surgeon: Jerilee Field, MD;  Location: [facility];  Service: Urology;  Laterality: Left;   ELECTROPHYSIOLOGIC STUDY  03-30-2000   dr Graciela Husbands   EXTRACORPOREAL SHOCK WAVE LITHOTRIPSY Left 10-30-2012   EXTRACORPOREAL SHOCK WAVE LITHOTRIPSY Right 11/16/2021   Procedure: EXTRACORPOREAL SHOCK WAVE LITHOTRIPSY (ESWL);  Surgeon: Marcine Matar, MD;  Location: [facility];  Service: Urology;  Laterality: Right;   HOLMIUM LASER APPLICATION Left 05/23/2015   Procedure: WITH HOLMIUM LASER LITHOTRIPSY;  Surgeon: Jerilee Field, MD;  Location: [facility];  Service: Urology;  Laterality: Left;   KNEE ARTHROSCOPY Bilateral x2 right/  left 2015   LUMBAR LAMINECTOMY/DECOMPRESSION MICRODISCECTOMY N/A 09/28/2012   Procedure: L3  -L5 DECOMPRESSION L4 - L5 FUSION WITH INSTRUMENTATION 2 LEVELS;  Surgeon: Venita Lick, MD;  Location: [facility] OR;  Service: Orthopedics;  Laterality: N/A;   RIGHT/LEFT HEART CATH AND CORONARY ANGIOGRAPHY N/A 01/27/2022   Procedure: RIGHT/LEFT HEART CATH AND CORONARY ANGIOGRAPHY;  Surgeon: Orbie Pyo, MD;  Location: [facility] INVASIVE CV LAB;  Service: Cardiovascular;  Laterality: N/A;   TEE WITHOUT CARDIOVERSION N/A 12/11/2021   Procedure: TRANSESOPHAGEAL ECHOCARDIOGRAM (TEE);  Surgeon: Parke Poisson, MD;  Location: [facility] ENDOSCOPY;  Service: Cardiology;  Laterality: N/A;   TEE WITHOUT CARDIOVERSION N/A 03/25/2022   Procedure: TRANSESOPHAGEAL ECHOCARDIOGRAM (TEE);  Surgeon: Orbie Pyo, MD;  Location: [facility] INVASIVE CV LAB;  Service: Cardiovascular;  Laterality: N/A;   TONSILLECTOMY  as child   TOTAL KNEE ARTHROPLASTY Left  10/02/2018   Procedure: TOTAL LEFT  KNEE ARTHROPLASTY;  Surgeon: Durene Romans, MD;  Location: [facility] ORS;  Service: Orthopedics;  Laterality: Left;  90 mins- Ok per Tiffany   TOTAL SHOULDER ARTHROPLASTY Right 01/14/2011   TRANSCAROTID ARTERY REVASCULARIZATION  Right 05/09/2023   Procedure: Right Transcarotid Artery Revascularization;  Surgeon: Victorino Sparrow, MD;  Location: [facility] OR;  Service: Vascular;  Laterality: Right;   TRANSCATHETER MITRAL EDGE TO EDGE REPAIR N/A 03/25/2022   Procedure: MITRAL VALVE REPAIR;  Surgeon: Orbie Pyo, MD;  Location: [facility] INVASIVE CV LAB;  Service: Cardiovascular;  Laterality: N/A;   TRANSTHORACIC ECHOCARDIOGRAM  05-21-2015   mild LVH,  ef 55-605,  grade 2 diastolic dysfunction, basal inferior hyperkinesis/  moderate AV calcification sclerosis without stenosis/  trivial MR/  moderate LAE/  mild TR/  mild RAE   ULTRASOUND GUIDANCE FOR VASCULAR ACCESS Left 05/09/2023   Procedure: ULTRASOUND GUIDANCE FOR VASCULAR ACCESS, LEFT FEMORAL VEIN;  Surgeon: Victorino Sparrow, MD;  Location: [facility] OR;  Service: Vascular;  Laterality: Left;   Family History  Problem Relation Age of Onset   Heart failure Mother    Prostate cancer Father  Social History   Socioeconomic History   Marital status: Married    Spouse name: Not on file   Number of children: 2   Years of education: Not on file   Highest education level: Not on file  Occupational History   Occupation: retired  Tobacco Use   Smoking status: Former    Current packs/day: 0.00    Average packs/day: 1 pack/day for 3.0 years (3.0 ttl pk-yrs)    Types: Cigarettes    Start date: 09/27/1959    Quit date: 09/26/1962    Years since quitting: 60.8   Smokeless tobacco: Never  Vaping Use   Vaping status: Never Used  Substance and Sexual Activity   Alcohol use: No   Drug use: No   Sexual activity: Not Currently  Other Topics Concern   Not on file  Social History Narrative   Lives at home with wife.  Used to have  cattle.     Social Drivers of Corporate investment banker Strain: Not on file  Food Insecurity: No Food Insecurity (04/29/2023)   Hunger Vital Sign    Worried About Running Out of Food in the Last Year: Never true    Ran Out of Food in the Last Year: Never true  Transportation Needs: No Transportation Needs (04/29/2023)   PRAPARE - Administrator, Civil Service (Medical): No    Lack of Transportation (Non-Medical): No  Physical Activity: Not on file  Stress: Not on file  Social Connections: Not on file  Intimate Partner Violence: Not At Risk (04/29/2023)   Humiliation, Afraid, Rape, and Kick questionnaire    Fear of Current or Ex-Partner: No    Emotionally Abused: No    Physically Abused: No    Sexually Abused: No     Physical Exam   Vitals:   08/01/23 2145 08/01/23 2200 08/01/23 2230 08/01/23 2245  BP: 131/78 (!) 136/123 136/65 129/87  Pulse: 70 72 75 79  Resp: 18 16 16 16   Temp:      TempSrc:      SpO2: 99% 96% 96% 97%  Weight:      Height:        Physical Exam Vitals and nursing note reviewed.  Constitutional:      General: He is not in acute distress.    Appearance: Normal appearance. He is well-developed. He is not ill-appearing or toxic-appearing.  HENT:     Head: Normocephalic and atraumatic.     Right Ear: External ear normal.     Left Ear: External ear normal.     Nose: Nose normal.     Mouth/Throat:     Mouth: Mucous membranes are moist.  Eyes:     Extraocular Movements: Extraocular movements intact.     Pupils: Pupils are equal, round, and reactive to light.  Cardiovascular:     Rate and Rhythm: Normal rate.     Pulses: Normal pulses.  Pulmonary:     Effort: Pulmonary effort is normal. No respiratory distress.     Breath sounds: Normal breath sounds. No stridor. No wheezing, rhonchi or rales.  Abdominal:     Palpations: Abdomen is soft.     Tenderness: There is no abdominal tenderness. There is no right CVA tenderness or left CVA  tenderness.  Musculoskeletal:        General: Normal range of motion.     Cervical back: Normal range of motion and neck supple.     Comments: Left hip tenderness, distally neurovascular  intact. Right shoulder wall tenderness, pulses intact distally  Skin:    General: Skin is warm and dry.     Capillary Refill: Capillary refill takes less than 2 seconds.  Neurological:     General: No focal deficit present.     Mental Status: He is alert and oriented to person, place, and time. Mental status is at baseline.     Comments: Moving all 4s, sensation intact  Psychiatric:        Mood and Affect: Mood normal.      Procedures   If procedures were preformed on this patient, they are listed below:  Procedures  The patient was seen, evaluated, and treated in conjunction with the attending physician, who voiced agreement in the care provided.  Note generated using Dragon voice dictation software and may contain dictation errors. Please contact me for any clarification or with any questions.   Electronically signed by:  Osvaldo Shipper, M.D. (PGY-2)    Gunnar Bulla, MD 08/01/23 2304    Virgina Norfolk, DO 08/01/23 2309

## 2023-08-01 NOTE — ED Triage Notes (Signed)
Pt arrived POV d/t mechanical fall.  Pt is on Eliquis and does not know if hit head.  Larey Seat getting out bed of truck.  Pt c/o right rib pain left hip and right shoulder.  Fell 6 hours ago.

## 2023-08-01 NOTE — ED Notes (Signed)
Pt return from CT scanner with Alycia Rossetti, RN, NAD Noted, remains alert, pain improved, warm blanket given

## 2023-08-02 ENCOUNTER — Encounter (HOSPITAL_COMMUNITY): Payer: Self-pay | Admitting: Internal Medicine

## 2023-08-02 ENCOUNTER — Inpatient Hospital Stay (HOSPITAL_COMMUNITY): Payer: Medicare HMO

## 2023-08-02 DIAGNOSIS — D72829 Elevated white blood cell count, unspecified: Secondary | ICD-10-CM | POA: Diagnosis not present

## 2023-08-02 DIAGNOSIS — Z85828 Personal history of other malignant neoplasm of skin: Secondary | ICD-10-CM | POA: Diagnosis not present

## 2023-08-02 DIAGNOSIS — Z96652 Presence of left artificial knee joint: Secondary | ICD-10-CM | POA: Diagnosis not present

## 2023-08-02 DIAGNOSIS — Z96653 Presence of artificial knee joint, bilateral: Secondary | ICD-10-CM | POA: Diagnosis present

## 2023-08-02 DIAGNOSIS — W19XXXA Unspecified fall, initial encounter: Secondary | ICD-10-CM | POA: Diagnosis not present

## 2023-08-02 DIAGNOSIS — N1832 Chronic kidney disease, stage 3b: Secondary | ICD-10-CM | POA: Diagnosis present

## 2023-08-02 DIAGNOSIS — K869 Disease of pancreas, unspecified: Secondary | ICD-10-CM | POA: Diagnosis not present

## 2023-08-02 DIAGNOSIS — Z87442 Personal history of urinary calculi: Secondary | ICD-10-CM | POA: Diagnosis not present

## 2023-08-02 DIAGNOSIS — Z87891 Personal history of nicotine dependence: Secondary | ICD-10-CM | POA: Diagnosis not present

## 2023-08-02 DIAGNOSIS — K219 Gastro-esophageal reflux disease without esophagitis: Secondary | ICD-10-CM | POA: Diagnosis not present

## 2023-08-02 DIAGNOSIS — I2510 Atherosclerotic heart disease of native coronary artery without angina pectoris: Secondary | ICD-10-CM | POA: Diagnosis not present

## 2023-08-02 DIAGNOSIS — R2681 Unsteadiness on feet: Secondary | ICD-10-CM | POA: Diagnosis not present

## 2023-08-02 DIAGNOSIS — W11XXXA Fall on and from ladder, initial encounter: Secondary | ICD-10-CM | POA: Diagnosis not present

## 2023-08-02 DIAGNOSIS — S32592A Other specified fracture of left pubis, initial encounter for closed fracture: Secondary | ICD-10-CM

## 2023-08-02 DIAGNOSIS — D72828 Other elevated white blood cell count: Secondary | ICD-10-CM | POA: Diagnosis not present

## 2023-08-02 DIAGNOSIS — Y92019 Unspecified place in single-family (private) house as the place of occurrence of the external cause: Secondary | ICD-10-CM | POA: Diagnosis not present

## 2023-08-02 DIAGNOSIS — I480 Paroxysmal atrial fibrillation: Secondary | ICD-10-CM | POA: Diagnosis present

## 2023-08-02 DIAGNOSIS — M1612 Unilateral primary osteoarthritis, left hip: Secondary | ICD-10-CM | POA: Diagnosis not present

## 2023-08-02 DIAGNOSIS — I10 Essential (primary) hypertension: Secondary | ICD-10-CM | POA: Diagnosis not present

## 2023-08-02 DIAGNOSIS — K5900 Constipation, unspecified: Secondary | ICD-10-CM | POA: Diagnosis not present

## 2023-08-02 DIAGNOSIS — Z043 Encounter for examination and observation following other accident: Secondary | ICD-10-CM | POA: Diagnosis not present

## 2023-08-02 DIAGNOSIS — R251 Tremor, unspecified: Secondary | ICD-10-CM | POA: Diagnosis not present

## 2023-08-02 DIAGNOSIS — M25511 Pain in right shoulder: Secondary | ICD-10-CM | POA: Diagnosis not present

## 2023-08-02 DIAGNOSIS — Y92009 Unspecified place in unspecified non-institutional (private) residence as the place of occurrence of the external cause: Secondary | ICD-10-CM | POA: Diagnosis not present

## 2023-08-02 DIAGNOSIS — S2241XA Multiple fractures of ribs, right side, initial encounter for closed fracture: Secondary | ICD-10-CM

## 2023-08-02 DIAGNOSIS — Y9389 Activity, other specified: Secondary | ICD-10-CM | POA: Diagnosis not present

## 2023-08-02 DIAGNOSIS — S2241XD Multiple fractures of ribs, right side, subsequent encounter for fracture with routine healing: Secondary | ICD-10-CM | POA: Diagnosis not present

## 2023-08-02 DIAGNOSIS — R935 Abnormal findings on diagnostic imaging of other abdominal regions, including retroperitoneum: Secondary | ICD-10-CM | POA: Diagnosis not present

## 2023-08-02 DIAGNOSIS — S2249XA Multiple fractures of ribs, unspecified side, initial encounter for closed fracture: Secondary | ICD-10-CM | POA: Diagnosis not present

## 2023-08-02 DIAGNOSIS — R2689 Other abnormalities of gait and mobility: Secondary | ICD-10-CM | POA: Diagnosis not present

## 2023-08-02 DIAGNOSIS — S2231XA Fracture of one rib, right side, initial encounter for closed fracture: Secondary | ICD-10-CM | POA: Diagnosis not present

## 2023-08-02 DIAGNOSIS — M25552 Pain in left hip: Secondary | ICD-10-CM | POA: Diagnosis not present

## 2023-08-02 DIAGNOSIS — M79605 Pain in left leg: Secondary | ICD-10-CM | POA: Diagnosis not present

## 2023-08-02 DIAGNOSIS — N400 Enlarged prostate without lower urinary tract symptoms: Secondary | ICD-10-CM | POA: Diagnosis not present

## 2023-08-02 DIAGNOSIS — E785 Hyperlipidemia, unspecified: Secondary | ICD-10-CM | POA: Diagnosis not present

## 2023-08-02 DIAGNOSIS — I5032 Chronic diastolic (congestive) heart failure: Secondary | ICD-10-CM | POA: Diagnosis present

## 2023-08-02 DIAGNOSIS — I252 Old myocardial infarction: Secondary | ICD-10-CM | POA: Diagnosis not present

## 2023-08-02 DIAGNOSIS — E039 Hypothyroidism, unspecified: Secondary | ICD-10-CM | POA: Diagnosis present

## 2023-08-02 DIAGNOSIS — S32592D Other specified fracture of left pubis, subsequent encounter for fracture with routine healing: Secondary | ICD-10-CM | POA: Diagnosis not present

## 2023-08-02 DIAGNOSIS — Z743 Need for continuous supervision: Secondary | ICD-10-CM | POA: Diagnosis not present

## 2023-08-02 DIAGNOSIS — M25562 Pain in left knee: Secondary | ICD-10-CM | POA: Diagnosis not present

## 2023-08-02 DIAGNOSIS — I340 Nonrheumatic mitral (valve) insufficiency: Secondary | ICD-10-CM | POA: Diagnosis not present

## 2023-08-02 DIAGNOSIS — M19072 Primary osteoarthritis, left ankle and foot: Secondary | ICD-10-CM | POA: Diagnosis not present

## 2023-08-02 DIAGNOSIS — M810 Age-related osteoporosis without current pathological fracture: Secondary | ICD-10-CM | POA: Diagnosis not present

## 2023-08-02 DIAGNOSIS — R531 Weakness: Secondary | ICD-10-CM | POA: Diagnosis not present

## 2023-08-02 DIAGNOSIS — Z79890 Hormone replacement therapy (postmenopausal): Secondary | ICD-10-CM | POA: Diagnosis not present

## 2023-08-02 DIAGNOSIS — Z79899 Other long term (current) drug therapy: Secondary | ICD-10-CM | POA: Diagnosis not present

## 2023-08-02 DIAGNOSIS — W19XXXD Unspecified fall, subsequent encounter: Secondary | ICD-10-CM | POA: Diagnosis not present

## 2023-08-02 DIAGNOSIS — I130 Hypertensive heart and chronic kidney disease with heart failure and stage 1 through stage 4 chronic kidney disease, or unspecified chronic kidney disease: Secondary | ICD-10-CM | POA: Diagnosis not present

## 2023-08-02 LAB — CBC WITH DIFFERENTIAL/PLATELET
Abs Immature Granulocytes: 0.05 10*3/uL (ref 0.00–0.07)
Basophils Absolute: 0.1 10*3/uL (ref 0.0–0.1)
Basophils Relative: 1 %
Eosinophils Absolute: 0.1 10*3/uL (ref 0.0–0.5)
Eosinophils Relative: 1 %
HCT: 38.9 % — ABNORMAL LOW (ref 39.0–52.0)
Hemoglobin: 12.5 g/dL — ABNORMAL LOW (ref 13.0–17.0)
Immature Granulocytes: 1 %
Lymphocytes Relative: 10 %
Lymphs Abs: 1 10*3/uL (ref 0.7–4.0)
MCH: 31.2 pg (ref 26.0–34.0)
MCHC: 32.1 g/dL (ref 30.0–36.0)
MCV: 97 fL (ref 80.0–100.0)
Monocytes Absolute: 0.8 10*3/uL (ref 0.1–1.0)
Monocytes Relative: 7 %
Neutro Abs: 8.5 10*3/uL — ABNORMAL HIGH (ref 1.7–7.7)
Neutrophils Relative %: 80 %
Platelets: 117 10*3/uL — ABNORMAL LOW (ref 150–400)
RBC: 4.01 MIL/uL — ABNORMAL LOW (ref 4.22–5.81)
RDW: 14.6 % (ref 11.5–15.5)
WBC: 10.5 10*3/uL (ref 4.0–10.5)
nRBC: 0 % (ref 0.0–0.2)

## 2023-08-02 LAB — COMPREHENSIVE METABOLIC PANEL
ALT: 19 U/L (ref 0–44)
AST: 19 U/L (ref 15–41)
Albumin: 3.1 g/dL — ABNORMAL LOW (ref 3.5–5.0)
Alkaline Phosphatase: 50 U/L (ref 38–126)
Anion gap: 8 (ref 5–15)
BUN: 21 mg/dL (ref 8–23)
CO2: 23 mmol/L (ref 22–32)
Calcium: 8.8 mg/dL — ABNORMAL LOW (ref 8.9–10.3)
Chloride: 108 mmol/L (ref 98–111)
Creatinine, Ser: 1.81 mg/dL — ABNORMAL HIGH (ref 0.61–1.24)
GFR, Estimated: 36 mL/min — ABNORMAL LOW (ref >60)
Glucose, Bld: 112 mg/dL — ABNORMAL HIGH (ref 70–99)
Potassium: 4.1 mmol/L (ref 3.5–5.1)
Sodium: 139 mmol/L (ref 135–145)
Total Bilirubin: 0.8 mg/dL (ref 0.0–1.2)
Total Protein: 5.7 g/dL — ABNORMAL LOW (ref 6.5–8.1)

## 2023-08-02 LAB — URINALYSIS, COMPLETE (UACMP) WITH MICROSCOPIC
Bacteria, UA: NONE SEEN
Bilirubin Urine: NEGATIVE
Glucose, UA: >=500 mg/dL — AB
Hgb urine dipstick: NEGATIVE
Ketones, ur: NEGATIVE mg/dL
Leukocytes,Ua: NEGATIVE
Nitrite: NEGATIVE
Protein, ur: NEGATIVE mg/dL
Specific Gravity, Urine: 1.031 — ABNORMAL HIGH (ref 1.005–1.030)
pH: 6 (ref 5.0–8.0)

## 2023-08-02 LAB — PHOSPHORUS: Phosphorus: 3.8 mg/dL (ref 2.5–4.6)

## 2023-08-02 LAB — MAGNESIUM: Magnesium: 2 mg/dL (ref 1.7–2.4)

## 2023-08-02 MED ORDER — ASPIRIN 81 MG PO TBEC
81.0000 mg | DELAYED_RELEASE_TABLET | Freq: Every day | ORAL | Status: DC
  Administered 2023-08-02 – 2023-08-09 (×8): 81 mg via ORAL
  Filled 2023-08-02 (×9): qty 1

## 2023-08-02 MED ORDER — LEVOTHYROXINE SODIUM 50 MCG PO TABS
50.0000 ug | ORAL_TABLET | Freq: Every morning | ORAL | Status: DC
  Administered 2023-08-02 – 2023-08-09 (×8): 50 ug via ORAL
  Filled 2023-08-02 (×7): qty 1
  Filled 2023-08-02: qty 2

## 2023-08-02 MED ORDER — OXYCODONE-ACETAMINOPHEN 5-325 MG PO TABS
1.0000 | ORAL_TABLET | ORAL | Status: DC | PRN
  Administered 2023-08-02 – 2023-08-06 (×10): 1 via ORAL
  Filled 2023-08-02 (×13): qty 1

## 2023-08-02 MED ORDER — ATORVASTATIN CALCIUM 80 MG PO TABS
80.0000 mg | ORAL_TABLET | Freq: Every day | ORAL | Status: DC
  Administered 2023-08-02 – 2023-08-08 (×7): 80 mg via ORAL
  Filled 2023-08-02 (×7): qty 1

## 2023-08-02 MED ORDER — EZETIMIBE 10 MG PO TABS
10.0000 mg | ORAL_TABLET | Freq: Every day | ORAL | Status: DC
  Administered 2023-08-02 – 2023-08-09 (×8): 10 mg via ORAL
  Filled 2023-08-02 (×10): qty 1

## 2023-08-02 MED ORDER — ONDANSETRON HCL 4 MG/2ML IJ SOLN: 4.0000 mg | Freq: Four times a day (QID) | INTRAMUSCULAR | Status: DC | PRN

## 2023-08-02 MED ORDER — NALOXONE HCL 0.4 MG/ML IJ SOLN: 0.4000 mg | INTRAMUSCULAR | Status: DC | PRN

## 2023-08-02 MED ORDER — ACETAMINOPHEN 325 MG PO TABS: 650.0000 mg | ORAL_TABLET | Freq: Four times a day (QID) | ORAL | Status: DC | PRN

## 2023-08-02 MED ORDER — HYDROMORPHONE HCL 1 MG/ML IJ SOLN: 0.5000 mg | INTRAMUSCULAR | Status: DC | PRN

## 2023-08-02 MED ORDER — FINASTERIDE 5 MG PO TABS
5.0000 mg | ORAL_TABLET | Freq: Every day | ORAL | Status: DC
  Administered 2023-08-02 – 2023-08-09 (×8): 5 mg via ORAL
  Filled 2023-08-02 (×9): qty 1

## 2023-08-02 MED ORDER — AMLODIPINE BESYLATE 2.5 MG PO TABS
2.5000 mg | ORAL_TABLET | Freq: Every day | ORAL | Status: DC
  Administered 2023-08-02 – 2023-08-09 (×8): 2.5 mg via ORAL
  Filled 2023-08-02 (×9): qty 1

## 2023-08-02 MED ORDER — MELATONIN 3 MG PO TABS
3.0000 mg | ORAL_TABLET | Freq: Every evening | ORAL | Status: DC | PRN
  Administered 2023-08-02 – 2023-08-08 (×5): 3 mg via ORAL
  Filled 2023-08-02 (×5): qty 1

## 2023-08-02 MED ORDER — PANTOPRAZOLE SODIUM 40 MG PO TBEC
40.0000 mg | DELAYED_RELEASE_TABLET | Freq: Every day | ORAL | Status: DC
  Administered 2023-08-02 – 2023-08-09 (×8): 40 mg via ORAL
  Filled 2023-08-02 (×9): qty 1

## 2023-08-02 MED ORDER — FUROSEMIDE 20 MG PO TABS
20.0000 mg | ORAL_TABLET | ORAL | Status: DC
  Administered 2023-08-03 – 2023-08-09 (×4): 20 mg via ORAL
  Filled 2023-08-02 (×6): qty 1

## 2023-08-02 MED ORDER — AMIODARONE HCL 200 MG PO TABS
200.0000 mg | ORAL_TABLET | Freq: Every day | ORAL | Status: DC
  Administered 2023-08-02 – 2023-08-09 (×8): 200 mg via ORAL
  Filled 2023-08-02 (×10): qty 1

## 2023-08-02 MED ORDER — APIXABAN 2.5 MG PO TABS: 2.5000 mg | ORAL_TABLET | Freq: Two times a day (BID) | ORAL | Status: DC

## 2023-08-02 MED ORDER — ACETAMINOPHEN 650 MG RE SUPP: 650.0000 mg | Freq: Four times a day (QID) | RECTAL | Status: DC | PRN

## 2023-08-02 MED ORDER — HYDROMORPHONE HCL 1 MG/ML IJ SOLN
0.5000 mg | Freq: Once | INTRAMUSCULAR | Status: AC
  Administered 2023-08-02: 0.5 mg via INTRAVENOUS
  Filled 2023-08-02: qty 1

## 2023-08-02 NOTE — ED Notes (Signed)
 Pt given a cup of sprite as requested

## 2023-08-02 NOTE — Consult Note (Addendum)
 Reason for Consult:fall from ladder Referring Physician: Dr Marcene Seena LITTIE [name] is an 87 y.o. male.  HPI: 50 yom with mmp on eliquis  who fell from step ladder. Remembers event. Complains for right sided chest pain and right leg pain. Has history of right knee arthroplasty by Ernie years ago.  He does have tremors that are longstanding. He has undergone evaluation with imaging that shows: neg head and c spine ct, right 4-7 rib fx, panc tail lesion.  I was asked to see him.   Past Medical History:  Diagnosis Date   Arthritis    knees,back   Atrial fibrillation (HCC)    CAD (coronary artery disease) CARDIOLOGIST-  DR HOCHREIN   previous myocardial infarction in 2001 with 90% circumflex lesion treated with PTCA. The LAD had 70-80% stenosis which was treawted with angioplasty. Most recent catheterization in 2003 demonstrated the LAD at 30% mid stenosis, circumflex 60-70% stenosis in the mid AV grove, and 40% stenosis in the previously stented area. The right coronary artery had 30-40% stenosis in the mid portion.    Chronic diastolic (congestive) heart failure (HCC)    CKD (chronic kidney disease) stage 3, GFR 30-59 ml/min (HCC)    GERD (gastroesophageal reflux disease)    History of kidney stones    Hypertension    Hypothyroidism    Left ureteral stone    Pre-diabetes    S/P mitral valve clip implantation 03/25/2022   s/p TEER wtih two XTW MitraClips by Dr. Wonda and Wendel   Severe mitral regurgitation    Skin cancer    face    Past Surgical History:  Procedure Laterality Date   APPENDECTOMY  as teen   BUBBLE STUDY  12/11/2021   Procedure: BUBBLE STUDY;  Surgeon: Loni Soyla LABOR, MD;  Location: [facility] ENDOSCOPY;  Service: Cardiology;;   CARDIAC CATHETERIZATION  03-29-2000   dr hochrein   Nonobstuctive CAD /   CFX patent stent and LAD diffuse luminal irregularities including mid segment previously been angioplastied   CARDIOVASCULAR STRESS TEST  05-09-2015   dr hochrein   low  risk perfusion study/  no reversible ischemia/  normal LV function and wall motion , ef 63%   CARDIOVERSION N/A 04/16/2022   Procedure: CARDIOVERSION;  Surgeon: Barbaraann Darryle Ned, MD;  Location: [facility] ENDOSCOPY;  Service: Cardiovascular;  Laterality: N/A;   CATARACT EXTRACTION W/ INTRAOCULAR LENS  IMPLANT, BILATERAL  left 05-06-2015/  right 06-10-2015   CORONARY ANGIOPLASTY WITH STENT PLACEMENT  01-21-2000  dr hochrein   PTCA to pLAD/  PTCA and BMS x1 to mid LCFX/  normal LVSF   CORONARY ANGIOPLASTY WITH STENT PLACEMENT  01-10-2002  dr charlanne   PTCA and BMS to mid AV LCFX (in-stent restenosis)/  diffuse 30-40% mRCA ,  mLAD diffuse 30%,  normal LVSF, ef 65%   CYSTOSCOPY/RETROGRADE/URETEROSCOPY/STONE EXTRACTION WITH BASKET Left 05/23/2015   Procedure: CYSTOSCOPY, FULGERATION OF PROSTATE,  LEFT URETEROSCOPY, JJ STENT PLACEMENT;  Surgeon: Donnice Brooks, MD;  Location: [facility];  Service: Urology;  Laterality: Left;   ELECTROPHYSIOLOGIC STUDY  03-30-2000   dr fernande   EXTRACORPOREAL SHOCK WAVE LITHOTRIPSY Left 10-30-2012   EXTRACORPOREAL SHOCK WAVE LITHOTRIPSY Right 11/16/2021   Procedure: EXTRACORPOREAL SHOCK WAVE LITHOTRIPSY (ESWL);  Surgeon: Matilda Senior, MD;  Location: [facility];  Service: Urology;  Laterality: Right;   HOLMIUM LASER APPLICATION Left 05/23/2015   Procedure: WITH HOLMIUM LASER LITHOTRIPSY;  Surgeon: Donnice Brooks, MD;  Location: [facility];  Service: Urology;  Laterality: Left;   KNEE ARTHROSCOPY Bilateral x2 right/  left 2015   LUMBAR LAMINECTOMY/DECOMPRESSION MICRODISCECTOMY N/A 09/28/2012   Procedure: L3  -L5 DECOMPRESSION L4 - L5 FUSION WITH INSTRUMENTATION 2 LEVELS;  Surgeon: Donaciano Sprang, MD;  Location: [facility] OR;  Service: Orthopedics;  Laterality: N/A;   RIGHT/LEFT HEART CATH AND CORONARY ANGIOGRAPHY N/A 01/27/2022   Procedure: RIGHT/LEFT HEART CATH AND CORONARY ANGIOGRAPHY;  Surgeon: Wendel Lurena POUR, MD;  Location: [facility]  INVASIVE CV LAB;  Service: Cardiovascular;  Laterality: N/A;   TEE WITHOUT CARDIOVERSION N/A 12/11/2021   Procedure: TRANSESOPHAGEAL ECHOCARDIOGRAM (TEE);  Surgeon: Loni Soyla LABOR, MD;  Location: [facility] ENDOSCOPY;  Service: Cardiology;  Laterality: N/A;   TEE WITHOUT CARDIOVERSION N/A 03/25/2022   Procedure: TRANSESOPHAGEAL ECHOCARDIOGRAM (TEE);  Surgeon: Thukkani, Arun K, MD;  Location: [facility] INVASIVE CV LAB;  Service: Cardiovascular;  Laterality: N/A;   TONSILLECTOMY  as child   TOTAL KNEE ARTHROPLASTY Left 10/02/2018   Procedure: TOTAL LEFT  KNEE ARTHROPLASTY;  Surgeon: Ernie Cough, MD;  Location: [facility] ORS;  Service: Orthopedics;  Laterality: Left;  90 mins- Ok per Tiffany   TOTAL SHOULDER ARTHROPLASTY Right 01/14/2011   TRANSCAROTID ARTERY REVASCULARIZATION  Right 05/09/2023   Procedure: Right Transcarotid Artery Revascularization;  Surgeon: Lanis Fonda BRAVO, MD;  Location: [facility] OR;  Service: Vascular;  Laterality: Right;   TRANSCATHETER MITRAL EDGE TO EDGE REPAIR N/A 03/25/2022   Procedure: MITRAL VALVE REPAIR;  Surgeon: Wendel Lurena POUR, MD;  Location: [facility] INVASIVE CV LAB;  Service: Cardiovascular;  Laterality: N/A;   TRANSTHORACIC ECHOCARDIOGRAM  05-21-2015   mild LVH,  ef 55-605,  grade 2 diastolic dysfunction, basal inferior hyperkinesis/  moderate AV calcification sclerosis without stenosis/  trivial MR/  moderate LAE/  mild TR/  mild RAE   ULTRASOUND GUIDANCE FOR VASCULAR ACCESS Left 05/09/2023   Procedure: ULTRASOUND GUIDANCE FOR VASCULAR ACCESS, LEFT FEMORAL VEIN;  Surgeon: Lanis Fonda BRAVO, MD;  Location: [facility] OR;  Service: Vascular;  Laterality: Left;    Family History  Problem Relation Age of Onset   Heart failure Mother    Prostate cancer Father     Social History:  reports that he quit smoking about 60 years ago. His smoking use included cigarettes. He started smoking about 63 years ago. He has a 3 pack-year smoking history. He has never used smokeless tobacco. He reports that he does  not drink alcohol and does not use drugs.  Allergies: No Known Allergies  Medications: I have reviewed the patient's current medications.  Results for orders placed or performed during the hospital encounter of 08/01/23 (from the past 48 hours)  CBG monitoring, ED     Status: Abnormal   Collection Time: 08/01/23  9:19 PM  Result Value Ref Range   Glucose-Capillary 122 (H) 70 - 99 mg/dL    Comment: Glucose reference range applies only to samples taken after fasting for at least 8 hours.  CBC with Differential     Status: Abnormal   Collection Time: 08/01/23  9:27 PM  Result Value Ref Range   WBC 13.9 (H) 4.0 - 10.5 K/uL   RBC 4.43 4.22 - 5.81 MIL/uL   Hemoglobin 13.8 13.0 - 17.0 g/dL   HCT 56.7 60.9 - 47.9 %   MCV 97.5 80.0 - 100.0 fL   MCH 31.2 26.0 - 34.0 pg   MCHC 31.9 30.0 - 36.0 g/dL   RDW 85.3 88.4 - 84.4 %   Platelets 152 150 - 400 K/uL   nRBC 0.0 0.0 - 0.2 %  Neutrophils Relative % 85 %   Neutro Abs 11.7 (H) 1.7 - 7.7 K/uL   Lymphocytes Relative 8 %   Lymphs Abs 1.1 0.7 - 4.0 K/uL   Monocytes Relative 6 %   Monocytes Absolute 0.8 0.1 - 1.0 K/uL   Eosinophils Relative 0 %   Eosinophils Absolute 0.1 0.0 - 0.5 K/uL   Basophils Relative 0 %   Basophils Absolute 0.1 0.0 - 0.1 K/uL   Immature Granulocytes 1 %   Abs Immature Granulocytes 0.10 (H) 0.00 - 0.07 K/uL    Comment: Performed at [facility], [address], [place], [place] [address]  Comprehensive metabolic panel     Status: Abnormal   Collection Time: 08/01/23  9:27 PM  Result Value Ref Range   Sodium 142 135 - 145 mmol/L   Potassium 4.3 3.5 - 5.1 mmol/L   Chloride 106 98 - 111 mmol/L   CO2 24 22 - 32 mmol/L   Glucose, Bld 128 (H) 70 - 99 mg/dL    Comment: Glucose reference range applies only to samples taken after fasting for at least 8 hours.   BUN 25 (H) 8 - 23 mg/dL   Creatinine, Ser 8.00 (H) 0.61 - 1.24 mg/dL   Calcium  9.4 8.9 - 10.3 mg/dL   Total Protein 6.9 6.5 - 8.1 g/dL   Albumin  3.8 3.5  - 5.0 g/dL   AST 21 15 - 41 U/L   ALT 20 0 - 44 U/L   Alkaline Phosphatase 54 38 - 126 U/L   Total Bilirubin 0.9 0.0 - 1.2 mg/dL   GFR, Estimated 32 (L) >60 mL/min    Comment: (NOTE) Calculated using the CKD-EPI Creatinine Equation (2021)    Anion gap 12 5 - 15    Comment: Performed at [facility], [address] [place], [place] [address]  Protime-INR     Status: None   Collection Time: 08/01/23  9:27 PM  Result Value Ref Range   Prothrombin Time 14.9 11.4 - 15.2 seconds   INR 1.2 0.8 - 1.2    Comment: (NOTE) INR goal varies based on device and disease states. Performed at [facility], [address], [place], [place] [address]   I-stat chem 8, ED (not at [facility], [facility] or [facility])     Status: Abnormal   Collection Time: 08/01/23  9:44 PM  Result Value Ref Range   Sodium 141 135 - 145 mmol/L   Potassium 4.3 3.5 - 5.1 mmol/L   Chloride 106 98 - 111 mmol/L   BUN 26 (H) 8 - 23 mg/dL   Creatinine, Ser 7.89 (H) 0.61 - 1.24 mg/dL   Glucose, Bld 875 (H) 70 - 99 mg/dL    Comment: Glucose reference range applies only to samples taken after fasting for at least 8 hours.   Calcium , Ion 1.18 1.15 - 1.40 mmol/L   TCO2 25 22 - 32 mmol/L   Hemoglobin 14.6 13.0 - 17.0 g/dL   HCT 56.9 60.9 - 47.9 %  Urinalysis, Complete w Microscopic -Urine, Clean Catch     Status: Abnormal   Collection Time: 08/02/23  4:23 AM  Result Value Ref Range   Color, Urine YELLOW YELLOW   APPearance CLEAR CLEAR   Specific Gravity, Urine 1.031 (H) 1.005 - 1.030   pH 6.0 5.0 - 8.0   Glucose, UA >=500 (A) NEGATIVE mg/dL   Hgb urine dipstick NEGATIVE NEGATIVE   Bilirubin Urine NEGATIVE NEGATIVE   Ketones, ur NEGATIVE NEGATIVE  mg/dL   Protein, ur NEGATIVE NEGATIVE mg/dL   Nitrite NEGATIVE NEGATIVE   Leukocytes,Ua NEGATIVE NEGATIVE   RBC / HPF 0-5 0 - 5 RBC/hpf   WBC, UA 0-5 0 - 5 WBC/hpf   Bacteria, UA NONE SEEN NONE SEEN   Squamous Epithelial / HPF 0-5 0 - 5 /HPF    Comment: Performed at [facility], [address], [place], [place] [address]  CBC with Differential/Platelet     Status: Abnormal   Collection Time: 08/02/23  5:40 AM  Result Value Ref Range   WBC 10.5 4.0 - 10.5 K/uL   RBC 4.01 (L) 4.22 - 5.81 MIL/uL   Hemoglobin 12.5 (L) 13.0 - 17.0 g/dL   HCT 61.0 (L) 60.9 - 47.9 %   MCV 97.0 80.0 - 100.0 fL   MCH 31.2 26.0 - 34.0 pg   MCHC 32.1 30.0 - 36.0 g/dL   RDW 85.3 88.4 - 84.4 %   Platelets 117 (L) 150 - 400 K/uL    Comment: REPEATED TO VERIFY   nRBC 0.0 0.0 - 0.2 %   Neutrophils Relative % 80 %   Neutro Abs 8.5 (H) 1.7 - 7.7 K/uL   Lymphocytes Relative 10 %   Lymphs Abs 1.0 0.7 - 4.0 K/uL   Monocytes Relative 7 %   Monocytes Absolute 0.8 0.1 - 1.0 K/uL   Eosinophils Relative 1 %   Eosinophils Absolute 0.1 0.0 - 0.5 K/uL   Basophils Relative 1 %   Basophils Absolute 0.1 0.0 - 0.1 K/uL   Immature Granulocytes 1 %   Abs Immature Granulocytes 0.05 0.00 - 0.07 K/uL    Comment: Performed at [facility], [address] [place], [place] [address]  Comprehensive metabolic panel     Status: Abnormal   Collection Time: 08/02/23  5:40 AM  Result Value Ref Range   Sodium 139 135 - 145 mmol/L   Potassium 4.1 3.5 - 5.1 mmol/L   Chloride 108 98 - 111 mmol/L   CO2 23 22 - 32 mmol/L   Glucose, Bld 112 (H) 70 - 99 mg/dL    Comment: Glucose reference range applies only to samples taken after fasting for at least 8 hours.   BUN 21 8 - 23 mg/dL   Creatinine, Ser 8.18 (H) 0.61 - 1.24 mg/dL   Calcium  8.8 (L) 8.9 - 10.3 mg/dL   Total Protein 5.7 (L) 6.5 - 8.1 g/dL   Albumin  3.1 (L) 3.5 - 5.0 g/dL   AST 19 15 - 41 U/L   ALT 19 0 - 44 U/L   Alkaline Phosphatase 50 38 - 126 U/L   Total Bilirubin 0.8 0.0 - 1.2 mg/dL   GFR, Estimated 36 (L) >60 mL/min    Comment: (NOTE) Calculated using the CKD-EPI Creatinine Equation (2021)    Anion gap 8 5 - 15    Comment: Performed at [facility], [address] [place], [place] [address]  Magnesium      Status: None    Collection Time: 08/02/23  5:40 AM  Result Value Ref Range   Magnesium  2.0 1.7 - 2.4 mg/dL    Comment: Performed at [facility], [address] [place], [place] [address]  Phosphorus     Status: None   Collection Time: 08/02/23  5:40 AM  Result Value Ref Range   Phosphorus 3.8 2.5 - 4.6 mg/dL    Comment: Performed at [facility], [address], [place], [place]  [address]    CT L-SPINE NO CHARGE Result Date: 08/01/2023 CLINICAL DATA:  [id number] Fall [id number] EXAM: CT THORACIC AND LUMBAR SPINE WITHOUT CONTRAST TECHNIQUE: Multidetector CT imaging of the thoracic and lumbar spine was performed without contrast. Multiplanar CT image reconstructions were also generated. RADIATION DOSE REDUCTION: This exam was performed according to the departmental dose-optimization program which includes automated exposure control, adjustment of the mA and/or kV according to patient size and/or use of iterative reconstruction technique. COMPARISON:  CT renal 10/27/2021: Chest x-ray 03/23/2022 FINDINGS: CT THORACIC SPINE FINDINGS Alignment: Normal. Vertebrae: Diffusely decreased bone density. Multilevel moderate degenerative changes spine. Mark Schmorl node along the superior endplate of T7. T5 vertebral body hemangioma. No acute fracture or focal pathologic process. Paraspinal and other soft tissues: Negative. Disc levels: Multilevel intervertebral disc space narrowing. CT LUMBAR SPINE FINDINGS Segmentation: 5 lumbar type vertebrae. Alignment: Stable grade 1 anterolisthesis of L4 on L5. Vertebrae: Diffusely decreased bone density. Multilevel moderate degenerative changes of the spine. L4-L5 posterolateral surgical hardware. No acute fracture or focal pathologic process. Paraspinal and other soft tissues: Negative. Disc levels: Multilevel intervertebral disc space vacuum phenomenon. IMPRESSION: CT THORACIC SPINE IMPRESSION No acute displaced fracture or traumatic listhesis of the thoracic spine. CT LUMBAR SPINE  IMPRESSION No acute displaced fracture or traumatic listhesis of the lumbar spine. Electronically Signed   By: Morgane  Naveau M.D.   On: 08/01/2023 23:04   CT T-SPINE NO CHARGE Result Date: 08/01/2023 CLINICAL DATA:  [id number] Fall [id number] EXAM: CT THORACIC AND LUMBAR SPINE WITHOUT CONTRAST TECHNIQUE: Multidetector CT imaging of the thoracic and lumbar spine was performed without contrast. Multiplanar CT image reconstructions were also generated. RADIATION DOSE REDUCTION: This exam was performed according to the departmental dose-optimization program which includes automated exposure control, adjustment of the mA and/or kV according to patient size and/or use of iterative reconstruction technique. COMPARISON:  CT renal 10/27/2021: Chest x-ray 03/23/2022 FINDINGS: CT THORACIC SPINE FINDINGS Alignment: Normal. Vertebrae: Diffusely decreased bone density. Multilevel moderate degenerative changes spine. Mark Schmorl node along the superior endplate of T7. T5 vertebral body hemangioma. No acute fracture or focal pathologic process. Paraspinal and other soft tissues: Negative. Disc levels: Multilevel intervertebral disc space narrowing. CT LUMBAR SPINE FINDINGS Segmentation: 5 lumbar type vertebrae. Alignment: Stable grade 1 anterolisthesis of L4 on L5. Vertebrae: Diffusely decreased bone density. Multilevel moderate degenerative changes of the spine. L4-L5 posterolateral surgical hardware. No acute fracture or focal pathologic process. Paraspinal and other soft tissues: Negative. Disc levels: Multilevel intervertebral disc space vacuum phenomenon. IMPRESSION: CT THORACIC SPINE IMPRESSION No acute displaced fracture or traumatic listhesis of the thoracic spine. CT LUMBAR SPINE IMPRESSION No acute displaced fracture or traumatic listhesis of the lumbar spine. Electronically Signed   By: Morgane  Naveau M.D.   On: 08/01/2023 23:04   CT CHEST ABDOMEN PELVIS W CONTRAST Result Date: 08/01/2023 CLINICAL DATA:  Polytrauma,  blunt Level 2 fall on thinners Pt arrived POV d/t mechanical fall. Pt is on Eliquis  and does not know if hit head. Clemens getting out bed of truck. Pt c/o right rib pain left hip and right shoulder. Fell 6 hours ago. EXAM: CT CHEST, ABDOMEN, AND PELVIS WITH CONTRAST TECHNIQUE: Multidetector CT imaging of the chest, abdomen and pelvis was performed following the standard protocol during bolus administration of intravenous contrast. RADIATION DOSE REDUCTION: This exam was performed according to the departmental dose-optimization program which includes automated exposure control, adjustment of the mA and/or kV according to patient size and/or use of iterative reconstruction technique. CONTRAST:  75mL OMNIPAQUE  IOHEXOL  350 MG/ML SOLN COMPARISON:  CT renal 10/27/2021 FINDINGS: CHEST: Cardiovascular: No aortic injury. The thoracic aorta is normal in caliber. Enlarged bilateral atria. No significant pericardial effusion. Surgical hardware along the mitral valve. Aortic valve leaflet calcification. Four-vessel coronary calcifications Mediastinum/Nodes: No pneumomediastinum. No mediastinal hematoma. The esophagus is unremarkable.  Small hiatal hernia. The thyroid  is unremarkable. The central airways are patent. No mediastinal, hilar, or axillary lymphadenopathy. Lungs/Pleura: Bilateral lower lobe atelectasis. No focal consolidation. Subpleural triangular nodules likely intrapulmonary lymph node. No pulmonary mass. No pulmonary contusion or laceration. No pneumatocele formation. No pleural effusion. No pneumothorax. No hemothorax. Musculoskeletal/Chest wall: No chest wall mass. Acute nondisplaced right 4-7 rib fractures. No acute displaced rib or sternal fracture. Total right shoulder arthroplasty. Please see separately dictated CT thoracolumbar spine. ABDOMEN / PELVIS: Hepatobiliary: Not enlarged. No focal lesion. No laceration or subcapsular hematoma. Calcified gallstones within the gallbladder lumen. No biliary ductal  dilatation. Pancreas: Atrophic parenchyma. Otherwise normal pancreatic contour. No main pancreatic duct dilatation. Interval increase in size of a 2.4 x 2.5 cm (from 1.5 cm) pancreatic tail fluid density lesion. Spleen: Not enlarged. No focal lesion. No laceration, subcapsular hematoma, or vascular injury. Adrenals/Urinary Tract: No nodularity bilaterally. Bilateral kidneys enhance symmetrically. A 2.5 cm hypodense lesion within the right kidney CT stable in size with density of 32 Hounsfield units likely represents a simple renal cyst. Fluid density lesion likely represents a simple renal cyst. 3 mm calcified stone within the left kidney. Simple renal cysts, in the absence of clinically indicated signs/symptoms, require no independent follow-up. No hydronephrosis. No contusion, laceration, or subcapsular hematoma. No injury to the vascular structures or collecting systems. No hydroureter. The urinary bladder is unremarkable. Stomach/Bowel: No small or large bowel wall thickening or dilatation. The appendix is unremarkable. Vasculature/Lymphatics: Severe atherosclerotic plaque. No abdominal aorta or iliac aneurysm. No active contrast extravasation or pseudoaneurysm. No abdominal, pelvic, inguinal lymphadenopathy. Reproductive: Enlarged prostate measuring up to 5.3 cm. Other: No simple free fluid ascites. No pneumoperitoneum. No hemoperitoneum. No mesenteric hematoma identified. No organized fluid collection. Musculoskeletal: No significant soft tissue hematoma. Small fat containing right inguinal hernia with tethering of the urinary bladder lumen. No acute pelvic fracture. Please see separately dictated CT thoracolumbar spine. Ports and Devices: None. IMPRESSION: 1. Acute nondisplaced right 4-7 rib fractures. 2. No acute intrathoracic, intra-abdominal, intrapelvic traumatic injury. 3. Please see separately dictated CT thoracolumbar spine. Other imaging findings of potential clinical significance: 1. Interval  increase in size of a 2.4 x 2.5 cm (from 1.5 cm) pancreatic tail fluid density lesion. 2. Cardiomegaly. 3. Small hiatal hernia. 4. Cholelithiasis with no acute cholecystitis. 5. Prostatomegaly. 6. Aortic Atherosclerosis (ICD10-I70.0) including four-vessel coronary calcification and aortic valve with calcification-correlate with aortic stenosis. 7. Small right inguinal hernia containing fat and mesentery tethering the urinary bladder lumen. Electronically Signed   By: Morgane  Naveau M.D.   On: 08/01/2023 22:58   CT Head Wo Contrast Result Date: 08/01/2023 CLINICAL DATA:  Head trauma, moderate-severe; Neck trauma (Age >= 65y) EXAM: CT HEAD WITHOUT CONTRAST CT CERVICAL SPINE WITHOUT CONTRAST TECHNIQUE: Multidetector CT imaging of the head and cervical spine was performed following the standard protocol without intravenous contrast. Multiplanar CT image reconstructions of the cervical spine were also generated. RADIATION DOSE REDUCTION: This exam was performed according to the departmental dose-optimization program which includes automated exposure control, adjustment of the mA and/or kV according to patient size and/or use of iterative reconstruction technique. COMPARISON:  MRI head 04/30/2023 FINDINGS: CT HEAD FINDINGS  Brain: Cerebral ventricle sizes are concordant with the degree of cerebral volume loss. Patchy and confluent areas of decreased attenuation are noted throughout the deep and periventricular white matter of the cerebral hemispheres bilaterally, compatible with chronic microvascular ischemic disease. No evidence of large-territorial acute infarction. No parenchymal hemorrhage. No mass lesion. No extra-axial collection. No mass effect or midline shift. No hydrocephalus. Basilar cisterns are patent. Vascular: No hyperdense vessel. Atherosclerotic calcifications are present within the cavernous internal carotid arteries. Skull: No acute fracture or focal lesion. Sinuses/Orbits: Paranasal sinuses and  mastoid air cells are clear. Bilateral lens replacement. Otherwise the orbits are unremarkable. Other: None. CT CERVICAL SPINE FINDINGS Alignment: Normal. Skull base and vertebrae: Multilevel severe degenerative changes of the spine with posterior disc osteophyte complex formation. Associated severe bilateral C4-C5 osseous neural foraminal stenosis. No severe osseous central canal stenosis. No acute fracture. No aggressive appearing focal osseous lesion or focal pathologic process. Soft tissues and spinal canal: No prevertebral fluid or swelling. No visible canal hematoma. Upper chest: Unremarkable. Other: Atherosclerotic plaque of the carotid arteries within the neck. Right carotid artery stent. IMPRESSION: 1. No acute intracranial abnormality. 2. No acute displaced fracture or traumatic listhesis of the cervical spine. Electronically Signed   By: Morgane  Naveau M.D.   On: 08/01/2023 22:40   CT Cervical Spine Wo Contrast Result Date: 08/01/2023 CLINICAL DATA:  Head trauma, moderate-severe; Neck trauma (Age >= 65y) EXAM: CT HEAD WITHOUT CONTRAST CT CERVICAL SPINE WITHOUT CONTRAST TECHNIQUE: Multidetector CT imaging of the head and cervical spine was performed following the standard protocol without intravenous contrast. Multiplanar CT image reconstructions of the cervical spine were also generated. RADIATION DOSE REDUCTION: This exam was performed according to the departmental dose-optimization program which includes automated exposure control, adjustment of the mA and/or kV according to patient size and/or use of iterative reconstruction technique. COMPARISON:  MRI head 04/30/2023 FINDINGS: CT HEAD FINDINGS Brain: Cerebral ventricle sizes are concordant with the degree of cerebral volume loss. Patchy and confluent areas of decreased attenuation are noted throughout the deep and periventricular white matter of the cerebral hemispheres bilaterally, compatible with chronic microvascular ischemic disease. No  evidence of large-territorial acute infarction. No parenchymal hemorrhage. No mass lesion. No extra-axial collection. No mass effect or midline shift. No hydrocephalus. Basilar cisterns are patent. Vascular: No hyperdense vessel. Atherosclerotic calcifications are present within the cavernous internal carotid arteries. Skull: No acute fracture or focal lesion. Sinuses/Orbits: Paranasal sinuses and mastoid air cells are clear. Bilateral lens replacement. Otherwise the orbits are unremarkable. Other: None. CT CERVICAL SPINE FINDINGS Alignment: Normal. Skull base and vertebrae: Multilevel severe degenerative changes of the spine with posterior disc osteophyte complex formation. Associated severe bilateral C4-C5 osseous neural foraminal stenosis. No severe osseous central canal stenosis. No acute fracture. No aggressive appearing focal osseous lesion or focal pathologic process. Soft tissues and spinal canal: No prevertebral fluid or swelling. No visible canal hematoma. Upper chest: Unremarkable. Other: Atherosclerotic plaque of the carotid arteries within the neck. Right carotid artery stent. IMPRESSION: 1. No acute intracranial abnormality. 2. No acute displaced fracture or traumatic listhesis of the cervical spine. Electronically Signed   By: Morgane  Naveau M.D.   On: 08/01/2023 22:40   DG Pelvis Portable Result Date: 08/01/2023 CLINICAL DATA:  fall, level 2 EXAM: PORTABLE PELVIS 1-2 VIEWS COMPARISON:  CT abdomen pelvis 08/01/2023 FINDINGS: There is no evidence of pelvic fracture or diastasis. No acute displaced fracture or dislocation of either hips. No pelvic bone lesions are seen. L4-L5 surgical hardware. Vascular  calcifications. IMPRESSION: Negative for acute traumatic injury. Electronically Signed   By: Morgane  Naveau M.D.   On: 08/01/2023 22:16   DG Shoulder Right Result Date: 08/01/2023 CLINICAL DATA:  Fall EXAM: RIGHT SHOULDER - 2+ VIEW COMPARISON:  None Available. FINDINGS: Total right shoulder  arthroplasty. There is no evidence of fracture or dislocation. Acromioclavicular joint degenerative changes. Soft tissues are unremarkable. IMPRESSION: Negative for acute traumatic injury. Electronically Signed   By: Morgane  Naveau M.D.   On: 08/01/2023 22:15   DG Chest Portable 1 View Result Date: 08/01/2023 CLINICAL DATA:  fall, level 2 EXAM: PORTABLE CHEST 1 VIEW COMPARISON:  Chest x-ray 03/23/2022 FINDINGS: The heart and mediastinal contours are within normal limits. Atherosclerotic plaque. No focal consolidation. No pulmonary edema. No pleural effusion. No pneumothorax. No acute osseous abnormality.  Total right shoulder arthroplasty. IMPRESSION: 1. No active disease. 2.  Aortic Atherosclerosis (ICD10-I70.0). Electronically Signed   By: Morgane  Naveau M.D.   On: 08/01/2023 22:14    Review of Systems  Cardiovascular:  Positive for chest pain (right sided with rib fx).  Musculoskeletal:  Positive for joint swelling (right knee and leg pain).  Neurological:  Positive for tremors. Syncope: not new. All other systems reviewed and are negative.  Blood pressure 138/72, pulse 79, temperature 97.9 F (36.6 C), temperature source Oral, resp. rate 14, height 5' 9 (1.753 m), weight 76.7 kg, SpO2 100%. Physical Exam Vitals reviewed.  Constitutional:      General: He is not in acute distress.    Appearance: Normal appearance.  HENT:     Head: Normocephalic and atraumatic.     Right Ear: External ear normal.     Left Ear: External ear normal.     Nose: Nose normal.     Mouth/Throat:     Mouth: Mucous membranes are moist.     Pharynx: Oropharynx is clear.  Eyes:     General: No scleral icterus.    Extraocular Movements: Extraocular movements intact.     Pupils: Pupils are equal, round, and reactive to light.  Neck:     Vascular: Carotid bruit: lateral right knee.  Cardiovascular:     Rate and Rhythm: Normal rate.     Pulses: Normal pulses.  Pulmonary:     Effort: Pulmonary effort is  normal.     Breath sounds: Rales: right lateral.  Chest:     Chest wall: Tenderness present.  Abdominal:     General: There is no distension.     Palpations: Abdomen is soft.     Tenderness: There is no abdominal tenderness.  Musculoskeletal:        General: Tenderness present. No swelling or deformity.     Cervical back: Normal range of motion. No tenderness.     Right lower leg: No edema.     Left lower leg: No edema.  Skin:    General: Skin is warm and dry.     Capillary Refill: Capillary refill takes less than 2 seconds.  Neurological:     General: No focal deficit present.     Mental Status: He is alert.  Psychiatric:        Mood and Affect: Mood normal.        Behavior: Behavior normal.     Assessment/Plan: Fall from ladder Right rib fx- pulm toilet, pain control Left  knee and leg pain- plain films written for Panc tail lesion- outpatient follow up Dvt prophylaxis recommended, can have regular diet, hold eliquis   I reviewed ED  provider notes, hospitalist notes, last 24 h vitals and pain scores, last 24 h labs and trends, and last 24 h imaging results. Discussed with ER    Donnice Bury 08/02/2023, 8:18 AM

## 2023-08-02 NOTE — ED Notes (Signed)
 ED TO INPATIENT HANDOFF REPORT  ED Nurse Name and Phone #: lynwood pinal rn [phone]  S Name/Age/Gender [name] 87 y.o. male Room/Bed: H012C/H012C  Code Status   Code Status: Full Code  Home/SNF/Other Home Patient oriented to: self, place, time, and situation Is this baseline? Yes   Triage Complete: Triage complete  Chief Complaint Multiple rib fractures [S22.49XA]  Triage Note Pt arrived POV d/t mechanical fall.  Pt is on Eliquis  and does not know if hit head.  Clemens getting out bed of truck.  Pt c/o right rib pain left hip and right shoulder.  Fell 6 hours ago.   Allergies No Known Allergies  Level of Care/Admitting Diagnosis ED Disposition     ED Disposition  Admit   Condition  --   Comment  Hospital Area: [facility] [[id number]]  Level of Care: Telemetry Medical [104]  May admit patient to [facility] or [facility] if equivalent level of care is available:: No  Covid Evaluation: Asymptomatic - no recent exposure (last 10 days) testing not required  Diagnosis: Multiple rib fractures [302324]  Admitting Physician: HOWERTER, JUSTIN B [[id number]]  Attending Physician: HOWERTER, JUSTIN B [[id number]]  Certification:: I certify this patient will need inpatient services for at least 2 midnights  Expected Medical Readiness: 08/04/2023          B Medical/Surgery History Past Medical History:  Diagnosis Date   Arthritis    knees,back   Atrial fibrillation (HCC)    CAD (coronary artery disease) CARDIOLOGIST-  DR HOCHREIN   previous myocardial infarction in 2001 with 90% circumflex lesion treated with PTCA. The LAD had 70-80% stenosis which was treawted with angioplasty. Most recent catheterization in 2003 demonstrated the LAD at 30% mid stenosis, circumflex 60-70% stenosis in the mid AV grove, and 40% stenosis in the previously stented area. The right coronary artery had 30-40% stenosis in the mid portion.    Chronic diastolic (congestive) heart failure  (HCC)    CKD (chronic kidney disease) stage 3, GFR 30-59 ml/min (HCC)    GERD (gastroesophageal reflux disease)    History of kidney stones    Hypertension    Hypothyroidism    Left ureteral stone    Pre-diabetes    S/P mitral valve clip implantation 03/25/2022   s/p TEER wtih two XTW MitraClips by Dr. Wonda and Wendel   Severe mitral regurgitation    Skin cancer    face   Past Surgical History:  Procedure Laterality Date   APPENDECTOMY  as teen   BUBBLE STUDY  12/11/2021   Procedure: BUBBLE STUDY;  Surgeon: Loni Soyla LABOR, MD;  Location: [facility] ENDOSCOPY;  Service: Cardiology;;   CARDIAC CATHETERIZATION  03-29-2000   dr hochrein   Nonobstuctive CAD /   CFX patent stent and LAD diffuse luminal irregularities including mid segment previously been angioplastied   CARDIOVASCULAR STRESS TEST  05-09-2015   dr hochrein   low risk perfusion study/  no reversible ischemia/  normal LV function and wall motion , ef 63%   CARDIOVERSION N/A 04/16/2022   Procedure: CARDIOVERSION;  Surgeon: Barbaraann Darryle Ned, MD;  Location: [facility] ENDOSCOPY;  Service: Cardiovascular;  Laterality: N/A;   CATARACT EXTRACTION W/ INTRAOCULAR LENS  IMPLANT, BILATERAL  left 05-06-2015/  right 06-10-2015   CORONARY ANGIOPLASTY WITH STENT PLACEMENT  01-21-2000  dr hochrein   PTCA to pLAD/  PTCA and BMS x1 to mid LCFX/  normal LVSF   CORONARY ANGIOPLASTY WITH STENT PLACEMENT  01-10-2002  dr  gupta   PTCA and BMS to mid AV LCFX (in-stent restenosis)/  diffuse 30-40% mRCA ,  mLAD diffuse 30%,  normal LVSF, ef 65%   CYSTOSCOPY/RETROGRADE/URETEROSCOPY/STONE EXTRACTION WITH BASKET Left 05/23/2015   Procedure: CYSTOSCOPY, FULGERATION OF PROSTATE,  LEFT URETEROSCOPY, JJ STENT PLACEMENT;  Surgeon: Donnice Brooks, MD;  Location: [facility];  Service: Urology;  Laterality: Left;   ELECTROPHYSIOLOGIC STUDY  03-30-2000   dr fernande   EXTRACORPOREAL SHOCK WAVE LITHOTRIPSY Left 10-30-2012   EXTRACORPOREAL SHOCK WAVE  LITHOTRIPSY Right 11/16/2021   Procedure: EXTRACORPOREAL SHOCK WAVE LITHOTRIPSY (ESWL);  Surgeon: Matilda Senior, MD;  Location: [facility];  Service: Urology;  Laterality: Right;   HOLMIUM LASER APPLICATION Left 05/23/2015   Procedure: WITH HOLMIUM LASER LITHOTRIPSY;  Surgeon: Donnice Brooks, MD;  Location: [facility];  Service: Urology;  Laterality: Left;   KNEE ARTHROSCOPY Bilateral x2 right/  left 2015   LUMBAR LAMINECTOMY/DECOMPRESSION MICRODISCECTOMY N/A 09/28/2012   Procedure: L3  -L5 DECOMPRESSION L4 - L5 FUSION WITH INSTRUMENTATION 2 LEVELS;  Surgeon: Donaciano Sprang, MD;  Location: [facility] OR;  Service: Orthopedics;  Laterality: N/A;   RIGHT/LEFT HEART CATH AND CORONARY ANGIOGRAPHY N/A 01/27/2022   Procedure: RIGHT/LEFT HEART CATH AND CORONARY ANGIOGRAPHY;  Surgeon: Wendel Lurena POUR, MD;  Location: [facility] INVASIVE CV LAB;  Service: Cardiovascular;  Laterality: N/A;   TEE WITHOUT CARDIOVERSION N/A 12/11/2021   Procedure: TRANSESOPHAGEAL ECHOCARDIOGRAM (TEE);  Surgeon: Loni Soyla LABOR, MD;  Location: [facility] ENDOSCOPY;  Service: Cardiology;  Laterality: N/A;   TEE WITHOUT CARDIOVERSION N/A 03/25/2022   Procedure: TRANSESOPHAGEAL ECHOCARDIOGRAM (TEE);  Surgeon: Thukkani, Arun K, MD;  Location: [facility] INVASIVE CV LAB;  Service: Cardiovascular;  Laterality: N/A;   TONSILLECTOMY  as child   TOTAL KNEE ARTHROPLASTY Left 10/02/2018   Procedure: TOTAL LEFT  KNEE ARTHROPLASTY;  Surgeon: Ernie Donnice, MD;  Location: [facility] ORS;  Service: Orthopedics;  Laterality: Left;  90 mins- Ok per Tiffany   TOTAL SHOULDER ARTHROPLASTY Right 01/14/2011   TRANSCAROTID ARTERY REVASCULARIZATION  Right 05/09/2023   Procedure: Right Transcarotid Artery Revascularization;  Surgeon: Lanis Fonda BRAVO, MD;  Location: [facility] OR;  Service: Vascular;  Laterality: Right;   TRANSCATHETER MITRAL EDGE TO EDGE REPAIR N/A 03/25/2022   Procedure: MITRAL VALVE REPAIR;  Surgeon: Wendel Lurena POUR, MD;  Location: [facility] INVASIVE  CV LAB;  Service: Cardiovascular;  Laterality: N/A;   TRANSTHORACIC ECHOCARDIOGRAM  05-21-2015   mild LVH,  ef 55-605,  grade 2 diastolic dysfunction, basal inferior hyperkinesis/  moderate AV calcification sclerosis without stenosis/  trivial MR/  moderate LAE/  mild TR/  mild RAE   ULTRASOUND GUIDANCE FOR VASCULAR ACCESS Left 05/09/2023   Procedure: ULTRASOUND GUIDANCE FOR VASCULAR ACCESS, LEFT FEMORAL VEIN;  Surgeon: Lanis Fonda BRAVO, MD;  Location: [facility] OR;  Service: Vascular;  Laterality: Left;     A IV Location/Drains/Wounds Patient Lines/Drains/Airways Status     Active Line/Drains/Airways     Name Placement date Placement time Site Days   Peripheral IV 08/01/23 20 G Left Antecubital 08/01/23  2200  Antecubital  1            Intake/Output Last 24 hours  Intake/Output Summary (Last 24 hours) at 08/02/2023 1358 Last data filed at 08/02/2023 1015 Gross per 24 hour  Intake 737 ml  Output 625 ml  Net 112 ml    Labs/Imaging Results for orders placed or performed during the hospital encounter of 08/01/23 (from the past 48 hours)  CBG monitoring, ED  Status: Abnormal   Collection Time: 08/01/23  9:19 PM  Result Value Ref Range   Glucose-Capillary 122 (H) 70 - 99 mg/dL    Comment: Glucose reference range applies only to samples taken after fasting for at least 8 hours.  CBC with Differential     Status: Abnormal   Collection Time: 08/01/23  9:27 PM  Result Value Ref Range   WBC 13.9 (H) 4.0 - 10.5 K/uL   RBC 4.43 4.22 - 5.81 MIL/uL   Hemoglobin 13.8 13.0 - 17.0 g/dL   HCT 56.7 60.9 - 47.9 %   MCV 97.5 80.0 - 100.0 fL   MCH 31.2 26.0 - 34.0 pg   MCHC 31.9 30.0 - 36.0 g/dL   RDW 85.3 88.4 - 84.4 %   Platelets 152 150 - 400 K/uL   nRBC 0.0 0.0 - 0.2 %   Neutrophils Relative % 85 %   Neutro Abs 11.7 (H) 1.7 - 7.7 K/uL   Lymphocytes Relative 8 %   Lymphs Abs 1.1 0.7 - 4.0 K/uL   Monocytes Relative 6 %   Monocytes Absolute 0.8 0.1 - 1.0 K/uL   Eosinophils Relative 0  %   Eosinophils Absolute 0.1 0.0 - 0.5 K/uL   Basophils Relative 0 %   Basophils Absolute 0.1 0.0 - 0.1 K/uL   Immature Granulocytes 1 %   Abs Immature Granulocytes 0.10 (H) 0.00 - 0.07 K/uL    Comment: Performed at [facility], [address], [place], [place] [address]  Comprehensive metabolic panel     Status: Abnormal   Collection Time: 08/01/23  9:27 PM  Result Value Ref Range   Sodium 142 135 - 145 mmol/L   Potassium 4.3 3.5 - 5.1 mmol/L   Chloride 106 98 - 111 mmol/L   CO2 24 22 - 32 mmol/L   Glucose, Bld 128 (H) 70 - 99 mg/dL    Comment: Glucose reference range applies only to samples taken after fasting for at least 8 hours.   BUN 25 (H) 8 - 23 mg/dL   Creatinine, Ser 8.00 (H) 0.61 - 1.24 mg/dL   Calcium  9.4 8.9 - 10.3 mg/dL   Total Protein 6.9 6.5 - 8.1 g/dL   Albumin  3.8 3.5 - 5.0 g/dL   AST 21 15 - 41 U/L   ALT 20 0 - 44 U/L   Alkaline Phosphatase 54 38 - 126 U/L   Total Bilirubin 0.9 0.0 - 1.2 mg/dL   GFR, Estimated 32 (L) >60 mL/min    Comment: (NOTE) Calculated using the CKD-EPI Creatinine Equation (2021)    Anion gap 12 5 - 15    Comment: Performed at [facility], [address], [place], [place] [address]  Protime-INR     Status: None   Collection Time: 08/01/23  9:27 PM  Result Value Ref Range   Prothrombin Time 14.9 11.4 - 15.2 seconds   INR 1.2 0.8 - 1.2    Comment: (NOTE) INR goal varies based on device and disease states. Performed at [facility], [address], [place], [place] [address]   I-stat chem 8, ED (not at [facility], [facility] or [facility])     Status: Abnormal   Collection Time: 08/01/23  9:44 PM  Result Value Ref Range   Sodium 141 135 - 145 mmol/L   Potassium 4.3 3.5 - 5.1 mmol/L   Chloride 106 98 - 111 mmol/L   BUN 26 (H) 8 - 23 mg/dL   Creatinine, Ser 7.89 (  H) 0.61 - 1.24 mg/dL   Glucose, Bld 875 (H) 70 - 99 mg/dL    Comment: Glucose reference range applies only to samples taken after fasting for at least 8 hours.   Calcium ,  Ion 1.18 1.15 - 1.40 mmol/L   TCO2 25 22 - 32 mmol/L   Hemoglobin 14.6 13.0 - 17.0 g/dL   HCT 56.9 60.9 - 47.9 %  Urinalysis, Complete w Microscopic -Urine, Clean Catch     Status: Abnormal   Collection Time: 08/02/23  4:23 AM  Result Value Ref Range   Color, Urine YELLOW YELLOW   APPearance CLEAR CLEAR   Specific Gravity, Urine 1.031 (H) 1.005 - 1.030   pH 6.0 5.0 - 8.0   Glucose, UA >=500 (A) NEGATIVE mg/dL   Hgb urine dipstick NEGATIVE NEGATIVE   Bilirubin Urine NEGATIVE NEGATIVE   Ketones, ur NEGATIVE NEGATIVE mg/dL   Protein, ur NEGATIVE NEGATIVE mg/dL   Nitrite NEGATIVE NEGATIVE   Leukocytes,Ua NEGATIVE NEGATIVE   RBC / HPF 0-5 0 - 5 RBC/hpf   WBC, UA 0-5 0 - 5 WBC/hpf   Bacteria, UA NONE SEEN NONE SEEN   Squamous Epithelial / HPF 0-5 0 - 5 /HPF    Comment: Performed at [facility], [address], [place], [place] [address]  CBC with Differential/Platelet     Status: Abnormal   Collection Time: 08/02/23  5:40 AM  Result Value Ref Range   WBC 10.5 4.0 - 10.5 K/uL   RBC 4.01 (L) 4.22 - 5.81 MIL/uL   Hemoglobin 12.5 (L) 13.0 - 17.0 g/dL   HCT 61.0 (L) 60.9 - 47.9 %   MCV 97.0 80.0 - 100.0 fL   MCH 31.2 26.0 - 34.0 pg   MCHC 32.1 30.0 - 36.0 g/dL   RDW 85.3 88.4 - 84.4 %   Platelets 117 (L) 150 - 400 K/uL    Comment: REPEATED TO VERIFY   nRBC 0.0 0.0 - 0.2 %   Neutrophils Relative % 80 %   Neutro Abs 8.5 (H) 1.7 - 7.7 K/uL   Lymphocytes Relative 10 %   Lymphs Abs 1.0 0.7 - 4.0 K/uL   Monocytes Relative 7 %   Monocytes Absolute 0.8 0.1 - 1.0 K/uL   Eosinophils Relative 1 %   Eosinophils Absolute 0.1 0.0 - 0.5 K/uL   Basophils Relative 1 %   Basophils Absolute 0.1 0.0 - 0.1 K/uL   Immature Granulocytes 1 %   Abs Immature Granulocytes 0.05 0.00 - 0.07 K/uL    Comment: Performed at [facility], [address] [place], [place] [address]  Comprehensive metabolic panel     Status: Abnormal   Collection Time: 08/02/23  5:40 AM  Result Value Ref Range    Sodium 139 135 - 145 mmol/L   Potassium 4.1 3.5 - 5.1 mmol/L   Chloride 108 98 - 111 mmol/L   CO2 23 22 - 32 mmol/L   Glucose, Bld 112 (H) 70 - 99 mg/dL    Comment: Glucose reference range applies only to samples taken after fasting for at least 8 hours.   BUN 21 8 - 23 mg/dL   Creatinine, Ser 8.18 (H) 0.61 - 1.24 mg/dL   Calcium  8.8 (L) 8.9 - 10.3 mg/dL   Total Protein 5.7 (L) 6.5 - 8.1 g/dL   Albumin  3.1 (L) 3.5 - 5.0 g/dL   AST 19 15 - 41 U/L   ALT 19 0 - 44 U/L   Alkaline Phosphatase 50 38 -  126 U/L   Total Bilirubin 0.8 0.0 - 1.2 mg/dL   GFR, Estimated 36 (L) >60 mL/min    Comment: (NOTE) Calculated using the CKD-EPI Creatinine Equation (2021)    Anion gap 8 5 - 15    Comment: Performed at [facility], [address], [place], [place] [address]  Magnesium      Status: None   Collection Time: 08/02/23  5:40 AM  Result Value Ref Range   Magnesium  2.0 1.7 - 2.4 mg/dL    Comment: Performed at [facility], [address] [place], [place] [address]  Phosphorus     Status: None   Collection Time: 08/02/23  5:40 AM  Result Value Ref Range   Phosphorus 3.8 2.5 - 4.6 mg/dL    Comment: Performed at [facility], [address] [place], [place] [address]   DG FEMUR MIN 2 VIEWS LEFT Result Date: 08/02/2023 CLINICAL DATA:  [id number] Fall from ladder [phone]. EXAM: LEFT FEMUR 2 VIEWS; LEFT KNEE - COMPLETE 4+ VIEW; LEFT TIBIA AND FIBULA - 2 VIEW COMPARISON:  None Available. FINDINGS: Bone mineralization within normal limits for patient's age. No acute fracture or dislocation. No aggressive osseous lesion. There are mild degenerative changes of the hip joint without significant joint space narrowing. Osteophytosis of the superior acetabulum. Patient is status post left total knee arthroplasty with patellar resurfacing. No periprosthetic fracture or lucency. The hardware is intact. There is near anatomic alignment. The ankle mortise is intact. Mild degenerative changes of ankle  joint. No focal soft tissue swelling. No radiopaque foreign bodies. IMPRESSION: *No acute osseous abnormality of the left femur, knee or leg. Electronically Signed   By: Ree Molt M.D.   On: 08/02/2023 10:43   DG Knee Complete 4 Views Left Result Date: 08/02/2023 CLINICAL DATA:  [id number] Fall from ladder [phone]. EXAM: LEFT FEMUR 2 VIEWS; LEFT KNEE - COMPLETE 4+ VIEW; LEFT TIBIA AND FIBULA - 2 VIEW COMPARISON:  None Available. FINDINGS: Bone mineralization within normal limits for patient's age. No acute fracture or dislocation. No aggressive osseous lesion. There are mild degenerative changes of the hip joint without significant joint space narrowing. Osteophytosis of the superior acetabulum. Patient is status post left total knee arthroplasty with patellar resurfacing. No periprosthetic fracture or lucency. The hardware is intact. There is near anatomic alignment. The ankle mortise is intact. Mild degenerative changes of ankle joint. No focal soft tissue swelling. No radiopaque foreign bodies. IMPRESSION: *No acute osseous abnormality of the left femur, knee or leg. Electronically Signed   By: Ree Molt M.D.   On: 08/02/2023 10:43   DG Tibia/Fibula Left Result Date: 08/02/2023 CLINICAL DATA:  [id number] Fall from ladder [phone]. EXAM: LEFT FEMUR 2 VIEWS; LEFT KNEE - COMPLETE 4+ VIEW; LEFT TIBIA AND FIBULA - 2 VIEW COMPARISON:  None Available. FINDINGS: Bone mineralization within normal limits for patient's age. No acute fracture or dislocation. No aggressive osseous lesion. There are mild degenerative changes of the hip joint without significant joint space narrowing. Osteophytosis of the superior acetabulum. Patient is status post left total knee arthroplasty with patellar resurfacing. No periprosthetic fracture or lucency. The hardware is intact. There is near anatomic alignment. The ankle mortise is intact. Mild degenerative changes of ankle joint. No focal soft tissue swelling. No radiopaque foreign  bodies. IMPRESSION: *No acute osseous abnormality of the left femur, knee or leg. Electronically Signed   By: Ree Molt M.D.   On: 08/02/2023 10:43   CT L-SPINE NO  CHARGE Result Date: 08/01/2023 CLINICAL DATA:  [id number] Fall [id number] EXAM: CT THORACIC AND LUMBAR SPINE WITHOUT CONTRAST TECHNIQUE: Multidetector CT imaging of the thoracic and lumbar spine was performed without contrast. Multiplanar CT image reconstructions were also generated. RADIATION DOSE REDUCTION: This exam was performed according to the departmental dose-optimization program which includes automated exposure control, adjustment of the mA and/or kV according to patient size and/or use of iterative reconstruction technique. COMPARISON:  CT renal 10/27/2021: Chest x-ray 03/23/2022 FINDINGS: CT THORACIC SPINE FINDINGS Alignment: Normal. Vertebrae: Diffusely decreased bone density. Multilevel moderate degenerative changes spine. Mark Schmorl node along the superior endplate of T7. T5 vertebral body hemangioma. No acute fracture or focal pathologic process. Paraspinal and other soft tissues: Negative. Disc levels: Multilevel intervertebral disc space narrowing. CT LUMBAR SPINE FINDINGS Segmentation: 5 lumbar type vertebrae. Alignment: Stable grade 1 anterolisthesis of L4 on L5. Vertebrae: Diffusely decreased bone density. Multilevel moderate degenerative changes of the spine. L4-L5 posterolateral surgical hardware. No acute fracture or focal pathologic process. Paraspinal and other soft tissues: Negative. Disc levels: Multilevel intervertebral disc space vacuum phenomenon. IMPRESSION: CT THORACIC SPINE IMPRESSION No acute displaced fracture or traumatic listhesis of the thoracic spine. CT LUMBAR SPINE IMPRESSION No acute displaced fracture or traumatic listhesis of the lumbar spine. Electronically Signed   By: Morgane  Naveau M.D.   On: 08/01/2023 23:04   CT T-SPINE NO CHARGE Result Date: 08/01/2023 CLINICAL DATA:  [id number] Fall [id number] EXAM: CT  THORACIC AND LUMBAR SPINE WITHOUT CONTRAST TECHNIQUE: Multidetector CT imaging of the thoracic and lumbar spine was performed without contrast. Multiplanar CT image reconstructions were also generated. RADIATION DOSE REDUCTION: This exam was performed according to the departmental dose-optimization program which includes automated exposure control, adjustment of the mA and/or kV according to patient size and/or use of iterative reconstruction technique. COMPARISON:  CT renal 10/27/2021: Chest x-ray 03/23/2022 FINDINGS: CT THORACIC SPINE FINDINGS Alignment: Normal. Vertebrae: Diffusely decreased bone density. Multilevel moderate degenerative changes spine. Mark Schmorl node along the superior endplate of T7. T5 vertebral body hemangioma. No acute fracture or focal pathologic process. Paraspinal and other soft tissues: Negative. Disc levels: Multilevel intervertebral disc space narrowing. CT LUMBAR SPINE FINDINGS Segmentation: 5 lumbar type vertebrae. Alignment: Stable grade 1 anterolisthesis of L4 on L5. Vertebrae: Diffusely decreased bone density. Multilevel moderate degenerative changes of the spine. L4-L5 posterolateral surgical hardware. No acute fracture or focal pathologic process. Paraspinal and other soft tissues: Negative. Disc levels: Multilevel intervertebral disc space vacuum phenomenon. IMPRESSION: CT THORACIC SPINE IMPRESSION No acute displaced fracture or traumatic listhesis of the thoracic spine. CT LUMBAR SPINE IMPRESSION No acute displaced fracture or traumatic listhesis of the lumbar spine. Electronically Signed   By: Morgane  Naveau M.D.   On: 08/01/2023 23:04   CT CHEST ABDOMEN PELVIS W CONTRAST Result Date: 08/01/2023 CLINICAL DATA:  Polytrauma, blunt Level 2 fall on thinners Pt arrived POV d/t mechanical fall. Pt is on Eliquis  and does not know if hit head. Clemens getting out bed of truck. Pt c/o right rib pain left hip and right shoulder. Fell 6 hours ago. EXAM: CT CHEST, ABDOMEN, AND PELVIS  WITH CONTRAST TECHNIQUE: Multidetector CT imaging of the chest, abdomen and pelvis was performed following the standard protocol during bolus administration of intravenous contrast. RADIATION DOSE REDUCTION: This exam was performed according to the departmental dose-optimization program which includes automated exposure control, adjustment of the mA and/or kV according to patient size and/or use of iterative reconstruction technique. CONTRAST:  75mL OMNIPAQUE  IOHEXOL  350 MG/ML SOLN COMPARISON:  CT renal 10/27/2021 FINDINGS: CHEST: Cardiovascular: No aortic injury. The thoracic aorta is normal in caliber. Enlarged bilateral atria. No significant pericardial effusion. Surgical hardware along the mitral valve. Aortic valve leaflet calcification. Four-vessel coronary calcifications Mediastinum/Nodes: No pneumomediastinum. No mediastinal hematoma. The esophagus is unremarkable.  Small hiatal hernia. The thyroid  is unremarkable. The central airways are patent. No mediastinal, hilar, or axillary lymphadenopathy. Lungs/Pleura: Bilateral lower lobe atelectasis. No focal consolidation. Subpleural triangular nodules likely intrapulmonary lymph node. No pulmonary mass. No pulmonary contusion or laceration. No pneumatocele formation. No pleural effusion. No pneumothorax. No hemothorax. Musculoskeletal/Chest wall: No chest wall mass. Acute nondisplaced right 4-7 rib fractures. No acute displaced rib or sternal fracture. Total right shoulder arthroplasty. Please see separately dictated CT thoracolumbar spine. ABDOMEN / PELVIS: Hepatobiliary: Not enlarged. No focal lesion. No laceration or subcapsular hematoma. Calcified gallstones within the gallbladder lumen. No biliary ductal dilatation. Pancreas: Atrophic parenchyma. Otherwise normal pancreatic contour. No main pancreatic duct dilatation. Interval increase in size of a 2.4 x 2.5 cm (from 1.5 cm) pancreatic tail fluid density lesion. Spleen: Not enlarged. No focal lesion. No  laceration, subcapsular hematoma, or vascular injury. Adrenals/Urinary Tract: No nodularity bilaterally. Bilateral kidneys enhance symmetrically. A 2.5 cm hypodense lesion within the right kidney CT stable in size with density of 32 Hounsfield units likely represents a simple renal cyst. Fluid density lesion likely represents a simple renal cyst. 3 mm calcified stone within the left kidney. Simple renal cysts, in the absence of clinically indicated signs/symptoms, require no independent follow-up. No hydronephrosis. No contusion, laceration, or subcapsular hematoma. No injury to the vascular structures or collecting systems. No hydroureter. The urinary bladder is unremarkable. Stomach/Bowel: No small or large bowel wall thickening or dilatation. The appendix is unremarkable. Vasculature/Lymphatics: Severe atherosclerotic plaque. No abdominal aorta or iliac aneurysm. No active contrast extravasation or pseudoaneurysm. No abdominal, pelvic, inguinal lymphadenopathy. Reproductive: Enlarged prostate measuring up to 5.3 cm. Other: No simple free fluid ascites. No pneumoperitoneum. No hemoperitoneum. No mesenteric hematoma identified. No organized fluid collection. Musculoskeletal: No significant soft tissue hematoma. Small fat containing right inguinal hernia with tethering of the urinary bladder lumen. No acute pelvic fracture. Please see separately dictated CT thoracolumbar spine. Ports and Devices: None. IMPRESSION: 1. Acute nondisplaced right 4-7 rib fractures. 2. No acute intrathoracic, intra-abdominal, intrapelvic traumatic injury. 3. Please see separately dictated CT thoracolumbar spine. Other imaging findings of potential clinical significance: 1. Interval increase in size of a 2.4 x 2.5 cm (from 1.5 cm) pancreatic tail fluid density lesion. 2. Cardiomegaly. 3. Small hiatal hernia. 4. Cholelithiasis with no acute cholecystitis. 5. Prostatomegaly. 6. Aortic Atherosclerosis (ICD10-I70.0) including four-vessel  coronary calcification and aortic valve with calcification-correlate with aortic stenosis. 7. Small right inguinal hernia containing fat and mesentery tethering the urinary bladder lumen. Electronically Signed   By: Morgane  Naveau M.D.   On: 08/01/2023 22:58   CT Head Wo Contrast Result Date: 08/01/2023 CLINICAL DATA:  Head trauma, moderate-severe; Neck trauma (Age >= 65y) EXAM: CT HEAD WITHOUT CONTRAST CT CERVICAL SPINE WITHOUT CONTRAST TECHNIQUE: Multidetector CT imaging of the head and cervical spine was performed following the standard protocol without intravenous contrast. Multiplanar CT image reconstructions of the cervical spine were also generated. RADIATION DOSE REDUCTION: This exam was performed according to the departmental dose-optimization program which includes automated exposure control, adjustment of the mA and/or kV according to patient size and/or use of iterative reconstruction technique. COMPARISON:  MRI head 04/30/2023 FINDINGS: CT HEAD FINDINGS Brain: Cerebral ventricle sizes are concordant with the  degree of cerebral volume loss. Patchy and confluent areas of decreased attenuation are noted throughout the deep and periventricular white matter of the cerebral hemispheres bilaterally, compatible with chronic microvascular ischemic disease. No evidence of large-territorial acute infarction. No parenchymal hemorrhage. No mass lesion. No extra-axial collection. No mass effect or midline shift. No hydrocephalus. Basilar cisterns are patent. Vascular: No hyperdense vessel. Atherosclerotic calcifications are present within the cavernous internal carotid arteries. Skull: No acute fracture or focal lesion. Sinuses/Orbits: Paranasal sinuses and mastoid air cells are clear. Bilateral lens replacement. Otherwise the orbits are unremarkable. Other: None. CT CERVICAL SPINE FINDINGS Alignment: Normal. Skull base and vertebrae: Multilevel severe degenerative changes of the spine with posterior disc  osteophyte complex formation. Associated severe bilateral C4-C5 osseous neural foraminal stenosis. No severe osseous central canal stenosis. No acute fracture. No aggressive appearing focal osseous lesion or focal pathologic process. Soft tissues and spinal canal: No prevertebral fluid or swelling. No visible canal hematoma. Upper chest: Unremarkable. Other: Atherosclerotic plaque of the carotid arteries within the neck. Right carotid artery stent. IMPRESSION: 1. No acute intracranial abnormality. 2. No acute displaced fracture or traumatic listhesis of the cervical spine. Electronically Signed   By: Morgane  Naveau M.D.   On: 08/01/2023 22:40   CT Cervical Spine Wo Contrast Result Date: 08/01/2023 CLINICAL DATA:  Head trauma, moderate-severe; Neck trauma (Age >= 65y) EXAM: CT HEAD WITHOUT CONTRAST CT CERVICAL SPINE WITHOUT CONTRAST TECHNIQUE: Multidetector CT imaging of the head and cervical spine was performed following the standard protocol without intravenous contrast. Multiplanar CT image reconstructions of the cervical spine were also generated. RADIATION DOSE REDUCTION: This exam was performed according to the departmental dose-optimization program which includes automated exposure control, adjustment of the mA and/or kV according to patient size and/or use of iterative reconstruction technique. COMPARISON:  MRI head 04/30/2023 FINDINGS: CT HEAD FINDINGS Brain: Cerebral ventricle sizes are concordant with the degree of cerebral volume loss. Patchy and confluent areas of decreased attenuation are noted throughout the deep and periventricular white matter of the cerebral hemispheres bilaterally, compatible with chronic microvascular ischemic disease. No evidence of large-territorial acute infarction. No parenchymal hemorrhage. No mass lesion. No extra-axial collection. No mass effect or midline shift. No hydrocephalus. Basilar cisterns are patent. Vascular: No hyperdense vessel. Atherosclerotic  calcifications are present within the cavernous internal carotid arteries. Skull: No acute fracture or focal lesion. Sinuses/Orbits: Paranasal sinuses and mastoid air cells are clear. Bilateral lens replacement. Otherwise the orbits are unremarkable. Other: None. CT CERVICAL SPINE FINDINGS Alignment: Normal. Skull base and vertebrae: Multilevel severe degenerative changes of the spine with posterior disc osteophyte complex formation. Associated severe bilateral C4-C5 osseous neural foraminal stenosis. No severe osseous central canal stenosis. No acute fracture. No aggressive appearing focal osseous lesion or focal pathologic process. Soft tissues and spinal canal: No prevertebral fluid or swelling. No visible canal hematoma. Upper chest: Unremarkable. Other: Atherosclerotic plaque of the carotid arteries within the neck. Right carotid artery stent. IMPRESSION: 1. No acute intracranial abnormality. 2. No acute displaced fracture or traumatic listhesis of the cervical spine. Electronically Signed   By: Morgane  Naveau M.D.   On: 08/01/2023 22:40   DG Pelvis Portable Result Date: 08/01/2023 CLINICAL DATA:  fall, level 2 EXAM: PORTABLE PELVIS 1-2 VIEWS COMPARISON:  CT abdomen pelvis 08/01/2023 FINDINGS: There is no evidence of pelvic fracture or diastasis. No acute displaced fracture or dislocation of either hips. No pelvic bone lesions are seen. L4-L5 surgical hardware. Vascular calcifications. IMPRESSION: Negative for acute traumatic injury. Electronically  Signed   By: Morgane  Naveau M.D.   On: 08/01/2023 22:16   DG Shoulder Right Result Date: 08/01/2023 CLINICAL DATA:  Fall EXAM: RIGHT SHOULDER - 2+ VIEW COMPARISON:  None Available. FINDINGS: Total right shoulder arthroplasty. There is no evidence of fracture or dislocation. Acromioclavicular joint degenerative changes. Soft tissues are unremarkable. IMPRESSION: Negative for acute traumatic injury. Electronically Signed   By: Morgane  Naveau M.D.   On:  08/01/2023 22:15   DG Chest Portable 1 View Result Date: 08/01/2023 CLINICAL DATA:  fall, level 2 EXAM: PORTABLE CHEST 1 VIEW COMPARISON:  Chest x-ray 03/23/2022 FINDINGS: The heart and mediastinal contours are within normal limits. Atherosclerotic plaque. No focal consolidation. No pulmonary edema. No pleural effusion. No pneumothorax. No acute osseous abnormality.  Total right shoulder arthroplasty. IMPRESSION: 1. No active disease. 2.  Aortic Atherosclerosis (ICD10-I70.0). Electronically Signed   By: Morgane  Naveau M.D.   On: 08/01/2023 22:14    Pending Labs Unresulted Labs (From admission, onward)    None       Vitals/Pain Today's Vitals   08/02/23 0800 08/02/23 0909 08/02/23 1007 08/02/23 1357  BP: 131/69 124/76 130/64 125/71  Pulse: 71 87 82 72  Resp: 14 16 20 17   Temp:  98.1 F (36.7 C)  98.3 F (36.8 C)  TempSrc:  Oral  Oral  SpO2: 97% 99% 97% 97%  Weight:      Height:      PainSc:  5  5      Isolation Precautions No active isolations  Medications Medications  melatonin tablet 3 mg (has no administration in time range)  ondansetron  (ZOFRAN ) injection 4 mg (has no administration in time range)  naloxone  (NARCAN ) injection 0.4 mg (has no administration in time range)  oxyCODONE -acetaminophen  (PERCOCET/ROXICET) 5-325 MG per tablet 1 tablet (1 tablet Oral Given 08/02/23 1012)  HYDROmorphone  (DILAUDID ) injection 0.5 mg (has no administration in time range)  amiodarone  (PACERONE ) tablet 200 mg (200 mg Oral Given 08/02/23 1012)  amLODipine  (NORVASC ) tablet 2.5 mg (2.5 mg Oral Given 08/02/23 1008)  aspirin  EC tablet 81 mg (81 mg Oral Given 08/02/23 1011)  atorvastatin  (LIPITOR ) tablet 80 mg (has no administration in time range)  ezetimibe  (ZETIA ) tablet 10 mg (10 mg Oral Given 08/02/23 1008)  finasteride  (PROSCAR ) tablet 5 mg (5 mg Oral Given 08/02/23 1008)  furosemide  (LASIX ) tablet 20 mg (has no administration in time range)  levothyroxine  (SYNTHROID ) tablet 50 mcg  (50 mcg Oral Given 08/02/23 0552)  pantoprazole  (PROTONIX ) EC tablet 40 mg (40 mg Oral Given 08/02/23 1009)  sodium chloride  0.9 % bolus 500 mL (0 mLs Intravenous Stopped 08/01/23 2259)  fentaNYL  (SUBLIMAZE ) injection 50 mcg (50 mcg Intravenous Given 08/01/23 2202)  iohexol  (OMNIPAQUE ) 350 MG/ML injection 75 mL (75 mLs Intravenous Contrast Given 08/01/23 2232)  HYDROmorphone  (DILAUDID ) injection 0.5 mg (0.5 mg Intravenous Given 08/02/23 0134)    Mobility Walk with person assist     Focused Assessments Musculoskeletal, broken ribs right side   R Recommendations: See Admitting Provider Note  Report given to:   Additional Notes:

## 2023-08-02 NOTE — Plan of Care (Signed)

## 2023-08-02 NOTE — ED Notes (Signed)
Pt given cup of ice water 

## 2023-08-02 NOTE — Progress Notes (Signed)
 Transition of Care [facility]) - CAGE-AID Screening   Patient Details  Name: [name] MRN: [id number] Date of Birth: June 15, 1936  [facility] CM/SW Contact:    Bernardino Mayotte, RN Phone Number: 08/02/2023, 3:37 AM   Clinical Narrative:  Denies use of alcohol and illicit substances, resources not given at this time.   CAGE-AID Screening:    Have You Ever Felt You Ought to Cut Down on Your Drinking or Drug Use?: No Have People Annoyed You By Critizing Your Drinking Or Drug Use?: No Have You Felt Bad Or Guilty About Your Drinking Or Drug Use?: No Have You Ever Had a Drink or Used Drugs First Thing In The Morning to Steady Your Nerves or to Get Rid of a Hangover?: No CAGE-AID Score: 0  Substance Abuse Education Offered: No

## 2023-08-02 NOTE — ED Notes (Signed)
C-collar removed by Dr. Pilar Plate

## 2023-08-02 NOTE — Progress Notes (Signed)
 Trauma Event Note   Rounded on pt--remains in ED waiting for inpt bed. Dr. Ebbie also rounding for trauma. C/o left knee pain, hx of replacement, unable to bend leg- was unable to bear weight on leg after fall-   Incentive spirometer given with education, with return demonstration- pt has used same in the past.   Last imported Vital Signs BP 130/64 (BP Location: Right Arm)   Pulse 82   Temp 98.1 F (36.7 C) (Oral)   Resp 20   Ht 5' 9 (1.753 m)   Wt 169 lb 0.8 oz (76.7 kg)   SpO2 97%   BMI 24.96 kg/m   Trending CBC Recent Labs    08/01/23 2127 08/01/23 2144 08/02/23 0540  WBC 13.9*  --  10.5  HGB 13.8 14.6 12.5*  HCT 43.2 43.0 38.9*  PLT 152  --  117*    Trending Coag's Recent Labs    08/01/23 2127  INR 1.2    Trending BMET Recent Labs    08/01/23 2127 08/01/23 2144 08/02/23 0540  NA 142 141 139  K 4.3 4.3 4.1  CL 106 106 108  CO2 24  --  23  BUN 25* 26* 21  CREATININE 1.99* 2.10* 1.81*  GLUCOSE 128* 124* 112*      [name]  Trauma Response RN  Please call TRN at [phone] for further assistance.

## 2023-08-02 NOTE — Evaluation (Signed)
 Occupational Therapy Evaluation Patient Details Name: [name] MRN: [id number] DOB: 1936/06/29 Today's Date: 08/02/2023   History of Present Illness 87 y.o. male with medical history significant for paroxysmal atrial fibrillation chronically anticoagulated, chronic diastolic heart failure, essential hypertension, severe mitral regurgitation status post mitral valve clip in August 2023, CKD, who is admitted to [facility] on 08/01/2023 with R 4-7 rib fx and L pubic ramus fx s/o mechanical fall.   Clinical Impression   Pt admitted with the above diagnosis. Pt currently with functional limitations due to the deficits listed below (see OT Problem List). Prior to admit, pt was independent with all ADL tasks and functional mobility living at home while being caregiver for his wife. Patient will benefit from continued inpatient follow up therapy, <3 hours/day. Pt will benefit from acute skilled OT to increase their safety and independence with ADL and functional mobility for ADL to facilitate discharge. OT will continue to follow pt acutely.         If plan is discharge home, recommend the following: A lot of help with walking and/or transfers;A lot of help with bathing/dressing/bathroom;Assistance with cooking/housework;Help with stairs or ramp for entrance;Assist for transportation    Functional Status Assessment  Patient has had a recent decline in their functional status and demonstrates the ability to make significant improvements in function in a reasonable and predictable amount of time.  Equipment Recommendations  Other (comment) (defer to next level of care)       Precautions / Restrictions Precautions Precautions: Fall Precaution Comments: right side rip fx Restrictions Weight Bearing Restrictions Per Provider Order: Yes LLE Weight Bearing Per Provider Order: Weight bearing as tolerated      Mobility Bed Mobility Overal bed mobility: Needs Assistance Bed Mobility:  Supine to Sit, Sit to Supine     Supine to sit: Min assist, HOB elevated, Used rails Sit to supine: Mod assist, Used rails   General bed mobility comments: VC for technique for all bed mobility. Greater difficulty when attempting to lift BLE up onto bed requiring increased physical assist. Able to position himself center in bed with VC and increased time.    Transfers Overall transfer level: Needs assistance Equipment used: Rolling walker (2 wheels) Transfers: Sit to/from Stand Sit to Stand: Contact guard assist, From elevated surface           General transfer comment: VC for hand placement during RW management prior to sit to stand transition. Pt able to complete sit to stand with CGA. VC to utilizing arm with RW to off load LLE while attempting to complete right lateral side steps towards HOB. Increased pain noted with max difficulty requring increased time to complete. Recommended taking pain medication prior to working with therapy tomorrow to increase functional performance. pt agreed with recommendation.      Balance Overall balance assessment: Needs assistance, History of Falls Sitting-balance support: Single extremity supported, Feet supported Sitting balance-Leahy Scale: Fair     Standing balance support: Bilateral upper extremity supported, During functional activity, Reliant on assistive device for balance Standing balance-Leahy Scale: Poor Standing balance comment: Able to maintain static standing balance without support. Unable to maintain dynamic standing balance unsupported and relied heavily on RW.      ADL either performed or assessed with clinical judgement   ADL Overall ADL's : Needs assistance/impaired Eating/Feeding: Modified independent;Bed level   Grooming: Wash/dry hands;Wash/dry face;Oral care;Set up;Bed level   Upper Body Bathing: Moderate assistance;Sitting   Lower Body Bathing: Total assistance;Bed  level   Upper Body Dressing : Moderate  assistance;Sitting   Lower Body Dressing: Total assistance;Bed level   Toilet Transfer: Minimal assistance;Stand-pivot;Cueing for sequencing;Cueing for safety;BSC/3in1;Rolling walker (2 wheels)   Toileting- Clothing Manipulation and Hygiene: Total assistance;Sit to/from stand          Vision Baseline Vision/History: 1 Wears glasses Ability to See in Adequate Light: 0 Adequate Patient Visual Report: No change from baseline Vision Assessment?: No apparent visual deficits     Perception Perception: Within Functional Limits       Praxis Praxis: WFL       Pertinent Vitals/Pain Pain Assessment Pain Assessment: 0-10 Pain Score: 4  (Significantly more pain noted when standing and attempting to place full weight into LLE.) Pain Location: Right ribs and Left hip Pain Descriptors / Indicators: Aching, Sore Pain Intervention(s): Limited activity within patient's tolerance, Monitored during session, Repositioned     Extremity/Trunk Assessment Upper Extremity Assessment Upper Extremity Assessment: Right hand dominant;RUE deficits/detail;LUE deficits/detail RUE Deficits / Details: Limited by Rib pain and previous RTC repair. Able to demonstrate active shoulder flexion to just under shoulder level. er [facility], limited IR. 3/5 strength throughout. RUE: Unable to fully assess due to pain LUE Deficits / Details: A/ROM is WNL with functional strength throughout.   Lower Extremity Assessment Lower Extremity Assessment: Defer to PT evaluation;Generalized weakness   Cervical / Trunk Assessment Cervical / Trunk Assessment: Kyphotic   Communication Communication Communication: No apparent difficulties   Cognition Arousal: Alert Behavior During Therapy: WFL for tasks assessed/performed Overall Cognitive Status: Within Functional Limits for tasks assessed      General Comments  VSS on RA. Pt did report some dizziness after standing when sitting on EOB. Unable to check BP at that time.             Home Living Family/patient expects to be discharged to:: Private residence Living Arrangements: Spouse/significant other (Pt is caregiver for wife.) Available Help at Discharge: Family;Available PRN/intermittently Type of Home: House Home Access: Stairs to enter [employer] of Steps: 2 Entrance Stairs-Rails: None Home Layout: One level     Bathroom Shower/Tub: Producer, Television/film/video: Handicapped height     Home Equipment: Shower seat          Prior Functioning/Environment Prior Level of Function : Independent/Modified Independent;Driving  Mobility Comments: Very active          OT Problem List: Decreased strength;Decreased range of motion;Decreased activity tolerance;Decreased knowledge of use of DME or AE;Impaired UE functional use;Impaired balance (sitting and/or standing);Pain      OT Treatment/Interventions: Self-care/ADL training;Therapeutic activities;Therapeutic exercise;Patient/family education;Energy conservation;DME and/or AE instruction;Balance training;Manual therapy;Modalities    OT Goals(Current goals can be found in the care plan section) Acute Rehab OT Goals Patient Stated Goal: to get better and go home OT Goal Formulation: With patient Time For Goal Achievement: 08/16/23 Potential to Achieve Goals: Good  OT Frequency: Min 1X/week       AM-PAC OT 6 Clicks Daily Activity     Outcome Measure Help from another person eating meals?: None Help from another person taking care of personal grooming?: A Little Help from another person toileting, which includes using toliet, bedpan, or urinal?: A Lot Help from another person bathing (including washing, rinsing, drying)?: Total Help from another person to put on and taking off regular upper body clothing?: A Little Help from another person to put on and taking off regular lower body clothing?: Total 6 Click Score: 14   End of Session  Equipment Utilized During Treatment: Gait  belt;Rolling walker (2 wheels) Nurse Communication: Mobility status  Activity Tolerance: Patient limited by pain Patient left: in bed;with call bell/phone within reach;with bed alarm set  OT Visit Diagnosis: Unsteadiness on feet (R26.81);History of falling (Z91.81);Pain Pain - Right/Left: Left Pain - part of body: Leg                Time: 8394-8366 OT Time Calculation (min): 28 min Charges:  OT General Charges $OT Visit: 1 Visit OT Evaluation $OT Eval Moderate Complexity: 1 Mod OT Treatments $Therapeutic Activity: 8-22 mins  Leita Howell, OTR/L,CBIS  Supplemental OT - [facility] and [facility] Secure Chat Preferred    Levi Klaiber, Leita BIRCH 08/02/2023, 5:27 PM

## 2023-08-02 NOTE — Progress Notes (Signed)
 PROGRESS NOTE    [name]  FMW:[id number] DOB: 1936-03-10 DOA: 08/01/2023 PCP: Arloa Elsie SAUNDERS, MD    Brief Narrative:   [name] is a 87 y.o. male with past medical history significant for paroxysmal atrial fibrillation on Eliquis , chronic diastolic congestive heart failure, HTN, severe mitral gravitation s/p mitral valve clip (August 2023), CKD stage IIIb (baseline creatinine 1.6-2.0) who presented to [facility] ED on 12/30 following mechanical fall with complaint of right-sided rib, left hip and right shoulder pain.  Patient reports he was utilizing a stepstool to get in and out of the bed of his truck when he slipped and fell onto his right side.  Patient unsure if he struck his head but denies any loss of consciousness.  Denies headache or acute neck pain.  Reports that he is compliant with his Eliquis .  Further denies palpitations, no chest pain, no diaphoresis, no nausea/vomiting, no diet dizziness, no visual changes, no fever/chills, no myalgias, no urinary symptoms.  In the ED, temperature 97.7 F, HR 66, RR 18, BP 147/86, SpO2 96% on room air.  WBC 13.9, hemoglobin 13.8, platelet count 152.  Sodium 142, potassium 4.3, chloride 106, CO2 24, glucose 120, BUN 25, creatinine 0.99.  CT head/C-spine without contrast with no acute intracranial abnormality, no acute displaced fracture or traumatic listhesis of the C-spine.  CT chest/abdomen/pelvis with acute nondisplaced right 4-7 rib fractures, no acute intrathoracic, intra-abdominal, intrapelvic traumatic injury, interval increase size of 2.4 x 2.5 cm pancreatic tail fluid density lesion, cardiomegaly, cholelithiasis without cholecystitis, prostamegaly, aortic atherosclerosis, small right inguinal hernia containing fat and mesentery.  Right shoulder negative for acute traumatic injury.  Pelvis x-ray negative for acute traumatic injury.  Chest x-ray with no active cardiopulmonary disease process.  Trauma surgery consulted.  Patient was given  fentanyl  50 mcg IV x 1, Dilaudid  0.5 mg IV x 1, NS 500 mL bolus.  TRH consulted for admission for further evaluation and management/pain control following fall resulting in right-sided rib fractures.  Assessment & Plan:   Acute nondisplaced 4th through 7th rib fractures on the right Mechanical fall Patient presenting to the ED with right-sided rib pain, right shoulder pain, left hip pain following mechanical fall after he slipped off a stepstool.  Imaging unremarkable except for CT chest/abdomen/pelvis notable for acute nondisplaced right 4-7 rib fractures without any further intra-thoracic/abdominal/intrapelvic traumatic injury.  X-ray left femur, knee, tibia/tibia but no acute osseous abnormality. -- Trauma surgery following, appreciate assistance -- Oxycodone  5 mg p.o. every 4 hours as needed moderate pain -- Dilaudid  0.5 mg IV every 2 hours as needed severe pain -- Incentive spirometry -- PT/OT evaluation  Left inferior pubic ramus fracture, nondisplaced CT left hip with subtle cortical irregularity of the left inferior pubic ramus concerning for a nondisplaced fracture, no further fracture identified. -- Weightbearing as tolerated -- PT/OT evaluation  Pancreatic tail lesion CT abdomen/pelvis notable for increase in size of a pancreatic tail fluid density measuring now 2.4 x 2.5 cm. -- Outpatient follow-up with GI/general surgery  Paroxysmal atrial fibrillation Chronic diastolic congestive heart failure Essential hypertension -- Amlodipine  2.5 mg p.o. daily -- Amiodarone  200 mg p.o. daily -- Aspirin  81 mg p.o. daily -- Furosemide  20 mg every other day -- Continue to hold Eliquis  per trauma surgery -- Monitor on telemetry -- Strict I's and O's and daily weights  Hyperlipidemia -- Atorvastatin  80 mg p.o. daily -- Zetia  10 mg p.o. daily  Hypothyroidism -- Levothyroxine  50 mcg p.o. daily  CKD stage IIIb  Baseline creatinine 1.6-2.0.,  Creatinine on admission 2.10 at  baseline. -- Cr 2.10>1.80 -- Avoid nephrotoxins, renal dose all medications  GERD -- Protonix  40 mg p.o. daily   DVT prophylaxis: SCDs Start: 08/02/23 0227    Code Status: Full Code Family Communication: No family present bedside this morning  Disposition Plan:  Level of care: Telemetry Medical Status is: Inpatient Remains inpatient appropriate because: Pain control, PT/OT evaluation    Consultants:  Trauma surgery, Dr. Ebbie.  Procedures:  None  Antimicrobials:  None   Subjective: Patient seen examined bedside, lying in bed.  Remains in ED holding area.  Seen by trauma surgery Dr. Ebbie.  Patient complaining of left hip pain with decreased ability with range of motion.  Pain to right sided ribs improved.  Pain medication helping.  Obtaining left hip CT as well as further imaging per trauma surgery with left knee, tibia/fibula, femur.  No other specific complaints, concerns or questions at this time.  No family present.  Denies headache, no visual changes, no chest pain, no palpitations, no shortness of breath, no abdominal pain, no fever/chills/night sweats, no nausea cefonicid diarrhea, no focal weakness, no fatigue, no paresthesia.  No acute events overnight per nursing staff.   Objective: Vitals:   08/02/23 0909 08/02/23 1007 08/02/23 1357 08/02/23 1429  BP: 124/76 130/64 125/71 (!) 149/69  Pulse: 87 82 72 75  Resp: 16 20 17 18   Temp: 98.1 F (36.7 C)  98.3 F (36.8 C) 97.8 F (36.6 C)  TempSrc: Oral  Oral Oral  SpO2: 99% 97% 97% 91%  Weight:      Height:        Intake/Output Summary (Last 24 hours) at 08/02/2023 1502 Last data filed at 08/02/2023 1015 Gross per 24 hour  Intake 737 ml  Output 625 ml  Net 112 ml   Filed Weights   08/01/23 2119  Weight: 76.7 kg    Examination:  Physical Exam: GEN: NAD, alert and oriented x 3, wd/wn HEENT: NCAT, PERRL, EOMI, sclera clear, MMM PULM: CTAB w/o wheezes/crackles, normal respiratory effort, on room  air CV: RRR w/o M/G/R GI: abd soft, NTND, NABS, no R/G/M MSK: no peripheral edema, decreased active/passive range of motion left hip due to pain, left knee with old surgical incision noted, also left knee with decreased range of motion due to pain from left hip, TTP right flank area surrounding rib fractures NEURO: CN II-XII intact, no focal deficits, sensation to light touch intact PSYCH: normal mood/affect Integumentary: dry/intact, no rashes or wounds    Data Reviewed: I have personally reviewed following labs and imaging studies  CBC: Recent Labs  Lab 08/01/23 2127 08/01/23 2144 08/02/23 0540  WBC 13.9*  --  10.5  NEUTROABS 11.7*  --  8.5*  HGB 13.8 14.6 12.5*  HCT 43.2 43.0 38.9*  MCV 97.5  --  97.0  PLT 152  --  117*   Basic Metabolic Panel: Recent Labs  Lab 08/01/23 2127 08/01/23 2144 08/02/23 0540  NA 142 141 139  K 4.3 4.3 4.1  CL 106 106 108  CO2 24  --  23  GLUCOSE 128* 124* 112*  BUN 25* 26* 21  CREATININE 1.99* 2.10* 1.81*  CALCIUM  9.4  --  8.8*  MG  --   --  2.0  PHOS  --   --  3.8   GFR: Estimated Creatinine Clearance: 29.8 mL/min (A) (by C-G formula based on SCr of 1.81 mg/dL (H)). Liver Function Tests: Recent Labs  Lab 08/01/23 2127 08/02/23 0540  AST 21 19  ALT 20 19  ALKPHOS 54 50  BILITOT 0.9 0.8  PROT 6.9 5.7*  ALBUMIN  3.8 3.1*   No results for input(s): LIPASE, AMYLASE in the last 168 hours. No results for input(s): AMMONIA in the last 168 hours. Coagulation Profile: Recent Labs  Lab 08/01/23 2127  INR 1.2   Cardiac Enzymes: No results for input(s): CKTOTAL, CKMB, CKMBINDEX, TROPONINI in the last 168 hours. BNP (last 3 results) No results for input(s): PROBNP in the last 8760 hours. HbA1C: No results for input(s): HGBA1C in the last 72 hours. CBG: Recent Labs  Lab 08/01/23 2119  GLUCAP 122*   Lipid Profile: No results for input(s): CHOL, HDL, LDLCALC, TRIG, CHOLHDL, LDLDIRECT in the last  72 hours. Thyroid  Function Tests: No results for input(s): TSH, T4TOTAL, FREET4, T3FREE, THYROIDAB in the last 72 hours. Anemia Panel: No results for input(s): VITAMINB12, FOLATE, FERRITIN, TIBC, IRON, RETICCTPCT in the last 72 hours. Sepsis Labs: No results for input(s): PROCALCITON, LATICACIDVEN in the last 168 hours.  No results found for this or any previous visit (from the past 240 hours).       Radiology Studies: CT HIP LEFT WO CONTRAST Result Date: 08/02/2023 CLINICAL DATA:  Fall from ladder.  Pain. EXAM: CT OF THE LEFT HIP WITHOUT CONTRAST TECHNIQUE: Multidetector CT imaging of the left hip was performed according to the standard protocol. Multiplanar CT image reconstructions were also generated. RADIATION DOSE REDUCTION: This exam was performed according to the departmental dose-optimization program which includes automated exposure control, adjustment of the mA and/or kV according to patient size and/or use of iterative reconstruction technique. COMPARISON:  CT chest, abdomen, and pelvis dated 08/01/2023. CT abdomen/pelvis dated 10/27/2021. FINDINGS: Bones/Joint/Cartilage Subtle cortical irregularity at the medial and lateral cortices of the left inferior pubic ramus (series 3, images 103-108). Remainder of the visualized bones appear intact. The left femoral head is seated within the acetabulum. Left greater trochanteric enthesopathy. The pubic symphysis is anatomically aligned. Mild joint space narrowing and marginal osteophytosis of the left hip. Ligaments Ligaments are suboptimally evaluated by CT. Muscles and Tendons Muscles are normal for age.  No intramuscular collection identified. Soft tissue No fluid collection or hematoma. The visualized intrapelvic contents are grossly unchanged. Excreted vicarious contrast is noted within bladder. IMPRESSION: Subtle cortical irregularity of the left inferior pubic ramus is concerning for a nondisplaced fracture. No  additional fracture identified. Electronically Signed   By: Harrietta Sherry M.D.   On: 08/02/2023 14:24   DG FEMUR MIN 2 VIEWS LEFT Result Date: 08/02/2023 CLINICAL DATA:  [id number] Fall from ladder [phone]. EXAM: LEFT FEMUR 2 VIEWS; LEFT KNEE - COMPLETE 4+ VIEW; LEFT TIBIA AND FIBULA - 2 VIEW COMPARISON:  None Available. FINDINGS: Bone mineralization within normal limits for patient's age. No acute fracture or dislocation. No aggressive osseous lesion. There are mild degenerative changes of the hip joint without significant joint space narrowing. Osteophytosis of the superior acetabulum. Patient is status post left total knee arthroplasty with patellar resurfacing. No periprosthetic fracture or lucency. The hardware is intact. There is near anatomic alignment. The ankle mortise is intact. Mild degenerative changes of ankle joint. No focal soft tissue swelling. No radiopaque foreign bodies. IMPRESSION: *No acute osseous abnormality of the left femur, knee or leg. Electronically Signed   By: Ree Molt M.D.   On: 08/02/2023 10:43   DG Knee Complete 4 Views Left Result Date: 08/02/2023 CLINICAL DATA:  [id number] Fall from ladder [phone].  EXAM: LEFT FEMUR 2 VIEWS; LEFT KNEE - COMPLETE 4+ VIEW; LEFT TIBIA AND FIBULA - 2 VIEW COMPARISON:  None Available. FINDINGS: Bone mineralization within normal limits for patient's age. No acute fracture or dislocation. No aggressive osseous lesion. There are mild degenerative changes of the hip joint without significant joint space narrowing. Osteophytosis of the superior acetabulum. Patient is status post left total knee arthroplasty with patellar resurfacing. No periprosthetic fracture or lucency. The hardware is intact. There is near anatomic alignment. The ankle mortise is intact. Mild degenerative changes of ankle joint. No focal soft tissue swelling. No radiopaque foreign bodies. IMPRESSION: *No acute osseous abnormality of the left femur, knee or leg. Electronically Signed    By: Ree Molt M.D.   On: 08/02/2023 10:43   DG Tibia/Fibula Left Result Date: 08/02/2023 CLINICAL DATA:  [id number] Fall from ladder [phone]. EXAM: LEFT FEMUR 2 VIEWS; LEFT KNEE - COMPLETE 4+ VIEW; LEFT TIBIA AND FIBULA - 2 VIEW COMPARISON:  None Available. FINDINGS: Bone mineralization within normal limits for patient's age. No acute fracture or dislocation. No aggressive osseous lesion. There are mild degenerative changes of the hip joint without significant joint space narrowing. Osteophytosis of the superior acetabulum. Patient is status post left total knee arthroplasty with patellar resurfacing. No periprosthetic fracture or lucency. The hardware is intact. There is near anatomic alignment. The ankle mortise is intact. Mild degenerative changes of ankle joint. No focal soft tissue swelling. No radiopaque foreign bodies. IMPRESSION: *No acute osseous abnormality of the left femur, knee or leg. Electronically Signed   By: Ree Molt M.D.   On: 08/02/2023 10:43   CT L-SPINE NO CHARGE Result Date: 08/01/2023 CLINICAL DATA:  [id number] Fall [id number] EXAM: CT THORACIC AND LUMBAR SPINE WITHOUT CONTRAST TECHNIQUE: Multidetector CT imaging of the thoracic and lumbar spine was performed without contrast. Multiplanar CT image reconstructions were also generated. RADIATION DOSE REDUCTION: This exam was performed according to the departmental dose-optimization program which includes automated exposure control, adjustment of the mA and/or kV according to patient size and/or use of iterative reconstruction technique. COMPARISON:  CT renal 10/27/2021: Chest x-ray 03/23/2022 FINDINGS: CT THORACIC SPINE FINDINGS Alignment: Normal. Vertebrae: Diffusely decreased bone density. Multilevel moderate degenerative changes spine. Mark Schmorl node along the superior endplate of T7. T5 vertebral body hemangioma. No acute fracture or focal pathologic process. Paraspinal and other soft tissues: Negative. Disc levels: Multilevel  intervertebral disc space narrowing. CT LUMBAR SPINE FINDINGS Segmentation: 5 lumbar type vertebrae. Alignment: Stable grade 1 anterolisthesis of L4 on L5. Vertebrae: Diffusely decreased bone density. Multilevel moderate degenerative changes of the spine. L4-L5 posterolateral surgical hardware. No acute fracture or focal pathologic process. Paraspinal and other soft tissues: Negative. Disc levels: Multilevel intervertebral disc space vacuum phenomenon. IMPRESSION: CT THORACIC SPINE IMPRESSION No acute displaced fracture or traumatic listhesis of the thoracic spine. CT LUMBAR SPINE IMPRESSION No acute displaced fracture or traumatic listhesis of the lumbar spine. Electronically Signed   By: Morgane  Naveau M.D.   On: 08/01/2023 23:04   CT T-SPINE NO CHARGE Result Date: 08/01/2023 CLINICAL DATA:  [id number] Fall [id number] EXAM: CT THORACIC AND LUMBAR SPINE WITHOUT CONTRAST TECHNIQUE: Multidetector CT imaging of the thoracic and lumbar spine was performed without contrast. Multiplanar CT image reconstructions were also generated. RADIATION DOSE REDUCTION: This exam was performed according to the departmental dose-optimization program which includes automated exposure control, adjustment of the mA and/or kV according to patient size and/or use of iterative reconstruction technique. COMPARISON:  CT renal 10/27/2021: Chest x-ray 03/23/2022  FINDINGS: CT THORACIC SPINE FINDINGS Alignment: Normal. Vertebrae: Diffusely decreased bone density. Multilevel moderate degenerative changes spine. Mark Schmorl node along the superior endplate of T7. T5 vertebral body hemangioma. No acute fracture or focal pathologic process. Paraspinal and other soft tissues: Negative. Disc levels: Multilevel intervertebral disc space narrowing. CT LUMBAR SPINE FINDINGS Segmentation: 5 lumbar type vertebrae. Alignment: Stable grade 1 anterolisthesis of L4 on L5. Vertebrae: Diffusely decreased bone density. Multilevel moderate degenerative changes of the  spine. L4-L5 posterolateral surgical hardware. No acute fracture or focal pathologic process. Paraspinal and other soft tissues: Negative. Disc levels: Multilevel intervertebral disc space vacuum phenomenon. IMPRESSION: CT THORACIC SPINE IMPRESSION No acute displaced fracture or traumatic listhesis of the thoracic spine. CT LUMBAR SPINE IMPRESSION No acute displaced fracture or traumatic listhesis of the lumbar spine. Electronically Signed   By: Morgane  Naveau M.D.   On: 08/01/2023 23:04   CT CHEST ABDOMEN PELVIS W CONTRAST Result Date: 08/01/2023 CLINICAL DATA:  Polytrauma, blunt Level 2 fall on thinners Pt arrived POV d/t mechanical fall. Pt is on Eliquis  and does not know if hit head. Clemens getting out bed of truck. Pt c/o right rib pain left hip and right shoulder. Fell 6 hours ago. EXAM: CT CHEST, ABDOMEN, AND PELVIS WITH CONTRAST TECHNIQUE: Multidetector CT imaging of the chest, abdomen and pelvis was performed following the standard protocol during bolus administration of intravenous contrast. RADIATION DOSE REDUCTION: This exam was performed according to the departmental dose-optimization program which includes automated exposure control, adjustment of the mA and/or kV according to patient size and/or use of iterative reconstruction technique. CONTRAST:  75mL OMNIPAQUE  IOHEXOL  350 MG/ML SOLN COMPARISON:  CT renal 10/27/2021 FINDINGS: CHEST: Cardiovascular: No aortic injury. The thoracic aorta is normal in caliber. Enlarged bilateral atria. No significant pericardial effusion. Surgical hardware along the mitral valve. Aortic valve leaflet calcification. Four-vessel coronary calcifications Mediastinum/Nodes: No pneumomediastinum. No mediastinal hematoma. The esophagus is unremarkable.  Small hiatal hernia. The thyroid  is unremarkable. The central airways are patent. No mediastinal, hilar, or axillary lymphadenopathy. Lungs/Pleura: Bilateral lower lobe atelectasis. No focal consolidation. Subpleural  triangular nodules likely intrapulmonary lymph node. No pulmonary mass. No pulmonary contusion or laceration. No pneumatocele formation. No pleural effusion. No pneumothorax. No hemothorax. Musculoskeletal/Chest wall: No chest wall mass. Acute nondisplaced right 4-7 rib fractures. No acute displaced rib or sternal fracture. Total right shoulder arthroplasty. Please see separately dictated CT thoracolumbar spine. ABDOMEN / PELVIS: Hepatobiliary: Not enlarged. No focal lesion. No laceration or subcapsular hematoma. Calcified gallstones within the gallbladder lumen. No biliary ductal dilatation. Pancreas: Atrophic parenchyma. Otherwise normal pancreatic contour. No main pancreatic duct dilatation. Interval increase in size of a 2.4 x 2.5 cm (from 1.5 cm) pancreatic tail fluid density lesion. Spleen: Not enlarged. No focal lesion. No laceration, subcapsular hematoma, or vascular injury. Adrenals/Urinary Tract: No nodularity bilaterally. Bilateral kidneys enhance symmetrically. A 2.5 cm hypodense lesion within the right kidney CT stable in size with density of 32 Hounsfield units likely represents a simple renal cyst. Fluid density lesion likely represents a simple renal cyst. 3 mm calcified stone within the left kidney. Simple renal cysts, in the absence of clinically indicated signs/symptoms, require no independent follow-up. No hydronephrosis. No contusion, laceration, or subcapsular hematoma. No injury to the vascular structures or collecting systems. No hydroureter. The urinary bladder is unremarkable. Stomach/Bowel: No small or large bowel wall thickening or dilatation. The appendix is unremarkable. Vasculature/Lymphatics: Severe atherosclerotic plaque. No abdominal aorta or iliac aneurysm. No active contrast extravasation or pseudoaneurysm. No abdominal,  pelvic, inguinal lymphadenopathy. Reproductive: Enlarged prostate measuring up to 5.3 cm. Other: No simple free fluid ascites. No pneumoperitoneum. No  hemoperitoneum. No mesenteric hematoma identified. No organized fluid collection. Musculoskeletal: No significant soft tissue hematoma. Small fat containing right inguinal hernia with tethering of the urinary bladder lumen. No acute pelvic fracture. Please see separately dictated CT thoracolumbar spine. Ports and Devices: None. IMPRESSION: 1. Acute nondisplaced right 4-7 rib fractures. 2. No acute intrathoracic, intra-abdominal, intrapelvic traumatic injury. 3. Please see separately dictated CT thoracolumbar spine. Other imaging findings of potential clinical significance: 1. Interval increase in size of a 2.4 x 2.5 cm (from 1.5 cm) pancreatic tail fluid density lesion. 2. Cardiomegaly. 3. Small hiatal hernia. 4. Cholelithiasis with no acute cholecystitis. 5. Prostatomegaly. 6. Aortic Atherosclerosis (ICD10-I70.0) including four-vessel coronary calcification and aortic valve with calcification-correlate with aortic stenosis. 7. Small right inguinal hernia containing fat and mesentery tethering the urinary bladder lumen. Electronically Signed   By: Morgane  Naveau M.D.   On: 08/01/2023 22:58   CT Head Wo Contrast Result Date: 08/01/2023 CLINICAL DATA:  Head trauma, moderate-severe; Neck trauma (Age >= 65y) EXAM: CT HEAD WITHOUT CONTRAST CT CERVICAL SPINE WITHOUT CONTRAST TECHNIQUE: Multidetector CT imaging of the head and cervical spine was performed following the standard protocol without intravenous contrast. Multiplanar CT image reconstructions of the cervical spine were also generated. RADIATION DOSE REDUCTION: This exam was performed according to the departmental dose-optimization program which includes automated exposure control, adjustment of the mA and/or kV according to patient size and/or use of iterative reconstruction technique. COMPARISON:  MRI head 04/30/2023 FINDINGS: CT HEAD FINDINGS Brain: Cerebral ventricle sizes are concordant with the degree of cerebral volume loss. Patchy and confluent areas  of decreased attenuation are noted throughout the deep and periventricular white matter of the cerebral hemispheres bilaterally, compatible with chronic microvascular ischemic disease. No evidence of large-territorial acute infarction. No parenchymal hemorrhage. No mass lesion. No extra-axial collection. No mass effect or midline shift. No hydrocephalus. Basilar cisterns are patent. Vascular: No hyperdense vessel. Atherosclerotic calcifications are present within the cavernous internal carotid arteries. Skull: No acute fracture or focal lesion. Sinuses/Orbits: Paranasal sinuses and mastoid air cells are clear. Bilateral lens replacement. Otherwise the orbits are unremarkable. Other: None. CT CERVICAL SPINE FINDINGS Alignment: Normal. Skull base and vertebrae: Multilevel severe degenerative changes of the spine with posterior disc osteophyte complex formation. Associated severe bilateral C4-C5 osseous neural foraminal stenosis. No severe osseous central canal stenosis. No acute fracture. No aggressive appearing focal osseous lesion or focal pathologic process. Soft tissues and spinal canal: No prevertebral fluid or swelling. No visible canal hematoma. Upper chest: Unremarkable. Other: Atherosclerotic plaque of the carotid arteries within the neck. Right carotid artery stent. IMPRESSION: 1. No acute intracranial abnormality. 2. No acute displaced fracture or traumatic listhesis of the cervical spine. Electronically Signed   By: Morgane  Naveau M.D.   On: 08/01/2023 22:40   CT Cervical Spine Wo Contrast Result Date: 08/01/2023 CLINICAL DATA:  Head trauma, moderate-severe; Neck trauma (Age >= 65y) EXAM: CT HEAD WITHOUT CONTRAST CT CERVICAL SPINE WITHOUT CONTRAST TECHNIQUE: Multidetector CT imaging of the head and cervical spine was performed following the standard protocol without intravenous contrast. Multiplanar CT image reconstructions of the cervical spine were also generated. RADIATION DOSE REDUCTION: This exam  was performed according to the departmental dose-optimization program which includes automated exposure control, adjustment of the mA and/or kV according to patient size and/or use of iterative reconstruction technique. COMPARISON:  MRI head 04/30/2023 FINDINGS: CT HEAD  FINDINGS Brain: Cerebral ventricle sizes are concordant with the degree of cerebral volume loss. Patchy and confluent areas of decreased attenuation are noted throughout the deep and periventricular white matter of the cerebral hemispheres bilaterally, compatible with chronic microvascular ischemic disease. No evidence of large-territorial acute infarction. No parenchymal hemorrhage. No mass lesion. No extra-axial collection. No mass effect or midline shift. No hydrocephalus. Basilar cisterns are patent. Vascular: No hyperdense vessel. Atherosclerotic calcifications are present within the cavernous internal carotid arteries. Skull: No acute fracture or focal lesion. Sinuses/Orbits: Paranasal sinuses and mastoid air cells are clear. Bilateral lens replacement. Otherwise the orbits are unremarkable. Other: None. CT CERVICAL SPINE FINDINGS Alignment: Normal. Skull base and vertebrae: Multilevel severe degenerative changes of the spine with posterior disc osteophyte complex formation. Associated severe bilateral C4-C5 osseous neural foraminal stenosis. No severe osseous central canal stenosis. No acute fracture. No aggressive appearing focal osseous lesion or focal pathologic process. Soft tissues and spinal canal: No prevertebral fluid or swelling. No visible canal hematoma. Upper chest: Unremarkable. Other: Atherosclerotic plaque of the carotid arteries within the neck. Right carotid artery stent. IMPRESSION: 1. No acute intracranial abnormality. 2. No acute displaced fracture or traumatic listhesis of the cervical spine. Electronically Signed   By: Morgane  Naveau M.D.   On: 08/01/2023 22:40   DG Pelvis Portable Result Date: 08/01/2023 CLINICAL  DATA:  fall, level 2 EXAM: PORTABLE PELVIS 1-2 VIEWS COMPARISON:  CT abdomen pelvis 08/01/2023 FINDINGS: There is no evidence of pelvic fracture or diastasis. No acute displaced fracture or dislocation of either hips. No pelvic bone lesions are seen. L4-L5 surgical hardware. Vascular calcifications. IMPRESSION: Negative for acute traumatic injury. Electronically Signed   By: Morgane  Naveau M.D.   On: 08/01/2023 22:16   DG Shoulder Right Result Date: 08/01/2023 CLINICAL DATA:  Fall EXAM: RIGHT SHOULDER - 2+ VIEW COMPARISON:  None Available. FINDINGS: Total right shoulder arthroplasty. There is no evidence of fracture or dislocation. Acromioclavicular joint degenerative changes. Soft tissues are unremarkable. IMPRESSION: Negative for acute traumatic injury. Electronically Signed   By: Morgane  Naveau M.D.   On: 08/01/2023 22:15   DG Chest Portable 1 View Result Date: 08/01/2023 CLINICAL DATA:  fall, level 2 EXAM: PORTABLE CHEST 1 VIEW COMPARISON:  Chest x-ray 03/23/2022 FINDINGS: The heart and mediastinal contours are within normal limits. Atherosclerotic plaque. No focal consolidation. No pulmonary edema. No pleural effusion. No pneumothorax. No acute osseous abnormality.  Total right shoulder arthroplasty. IMPRESSION: 1. No active disease. 2.  Aortic Atherosclerosis (ICD10-I70.0). Electronically Signed   By: Morgane  Naveau M.D.   On: 08/01/2023 22:14        Scheduled Meds:  amiodarone   200 mg Oral Daily   amLODipine   2.5 mg Oral Daily   aspirin  EC  81 mg Oral Daily   atorvastatin   80 mg Oral QHS   ezetimibe   10 mg Oral Daily   finasteride   5 mg Oral Daily   [START ON 08/03/2023] furosemide   20 mg Oral QODAY   levothyroxine   50 mcg Oral q morning   pantoprazole   40 mg Oral Daily   Continuous Infusions:   LOS: 0 days    Time spent: 52 minutes spent on chart review, discussion with nursing staff, consultants, updating family and interview/physical exam; more than 50% of that time was  spent in counseling and/or coordination of care.    Camellia PARAS Keishaun Hazel, DO [facility] Available via Epic secure chat 7am-7pm After these hours, please refer to coverage provider listed on [email or web address] 08/02/2023, 3:02  PM

## 2023-08-02 NOTE — ED Notes (Signed)
Patient was given a cup of ice water. 

## 2023-08-02 NOTE — H&P (Signed)
 History and Physical      [name] FMW:[id number] DOB: 08/30/1935 DOA: 08/01/2023; DOS: 08/02/2023  PCP: Jack Elsie SAUNDERS, MD  Patient coming from: home   I have personally briefly reviewed patient's old medical records in [facility]  Chief Complaint: fall  HPI: [name] is a 87 y.o. male with medical history significant for paroxysmal atrial fibrillation chronically anticoagulated on Eliquis , chronic diastolic heart failure, essential hypertension, severe mitral regurgitation status post mitral valve clip in August 2023, CKD 3B with baseline creatinine 1.6-2.0, who is admitted to [facility] on 08/01/2023 with multiple consecutive right-sided rib fractures after presenting from home to [facility] ED complaining of fall.   The patient reports that he was on a small step ladder, taking down Christmas decorations, when he slipped off of the third stair, resulting in a fall to the ground below, in which the right portion of his torso served as a personal point of contact with the ground.  He is unsure if he may have hit his head as a component of this fall, but emphasizes that if he did hit his head it was not the proximal point of contact with the ground, denies any associated loss of consciousness or any ensuing headache or acute neck pain.  However, upon hitting the ground, he did note acute onset of sharp nonradiating right-sided chest discomfort.  Has some acute discomfort involving the right shoulder.  Otherwise, denies any significant acute arthralgias as a consequence of the above fall.  He confirms that he is chronically anticoagulated on Eliquis  in the setting of a history of paroxysmal atrial fibrillation.  Denies any recent left-sided chest discomfort or any recent palpitations, diaphoresis, nausea, vomiting, dizziness, presyncope, or syncope.  No acute change in vision.  Denies any recent subjective fever, chills, rigors, or generalized myalgias.  No recent dysuria or  gross hematuria.  Denies any known chronic underlying pulmonary pathology, including no history of COPD.    ED Course:  Vital signs in the ED were notable for the following: Afebrile; heart rates in the 60s to 80s; systolic blood pressures in the 120s to 150s; respiratory rate 15-18, oxygen saturation 96 to 100% on room air.  Labs were notable for the following: CMP was notable for sodium 142, bicarbonate 24, creatinine 1.99 compared to most recent prior serum creatinine data point of 1.89 on 05/10/2023, glucose 128, liver enzymes were within normal limits.  CBC notable for white blood cell count 13,900, hemoglobin 13.8, platelet count 152.  INR 1.2.  Per my interpretation, EKG in ED demonstrated the following: No EKG performed in the ED today.  Imaging in the ED, per corresponding formal radiology read, was notable for the following: Plain film of the right shoulder showed no evidence of acute fracture or dislocation.  Will view chest x-ray showed no evidence of acute cardiopulmonary process, Cleen evidence of infiltrate, edema, effusion, or pneumothorax.  Plan films of the pelvis showed no evidence of acute fracture or dislocation.  CT head showed no evidence of acute intracranial process, Cleen evidence of intracranial strain and subacute infarct.  CT cervical spine showed evidence of acute cervical spine fracture subluxation injury.  CT chest, abdomen, pelvis with contrast showed acute, nondisplaced right 4 through 7 rib fractures, but demonstrate no evidence of acute intrathoracic, acute intra-abdominal, or acute intrapelvic process.  CT thoracic spine showed evidence of acute thoracic spine fracture or subluxation injury.  CT lumbar spine showed no evidence of acute lumbar spine fracture  or subluxation injury.  EDP discussed patient's case with on-call trauma surgery, who recommended TRH admission, and conveyed the trauma surgery service will formally consult.    While in the ED, the following  were administered: Fentanyl  50 mcg IV x 1 dose, Dilaudid  0.5 mg IV x 1 dose, normal saline has 500 cc bolus.  Subsequently, the patient was admitted for further evaluation management of presenting multiple consecutive right-sided rib fractures involving ribs 4 through 7, without evidence of displacement, but presenting with mechanical fall.     Review of Systems: As per HPI otherwise 10 point review of systems negative.   Past Medical History:  Diagnosis Date   Arthritis    knees,back   Atrial fibrillation (HCC)    CAD (coronary artery disease) CARDIOLOGIST-  [name]   previous myocardial infarction in 2001 with 90% circumflex lesion treated with PTCA. The LAD had 70-80% stenosis which was treawted with angioplasty. Most recent catheterization in 2003 demonstrated the LAD at 30% mid stenosis, circumflex 60-70% stenosis in the mid AV grove, and 40% stenosis in the previously stented area. The right coronary artery had 30-40% stenosis in the mid portion.    Chronic diastolic (congestive) heart failure (HCC)    CKD (chronic kidney disease) stage 3, GFR 30-59 ml/min (HCC)    GERD (gastroesophageal reflux disease)    History of kidney stones    Hypertension    Hypothyroidism    Left ureteral stone    Pre-diabetes    S/P mitral valve clip implantation 03/25/2022   s/p TEER wtih two XTW MitraClips by Jack. Wonda and [name]   Severe mitral regurgitation    Skin cancer    face    Past Surgical History:  Procedure Laterality Date   APPENDECTOMY  as teen   BUBBLE STUDY  12/11/2021   Procedure: BUBBLE STUDY;  Surgeon: [name] LABOR, MD;  Location: [facility] ENDOSCOPY;  Service: Cardiology;;   CARDIAC CATHETERIZATION  03-29-2000   [name]   Nonobstuctive CAD /   CFX patent stent and LAD diffuse luminal irregularities including mid segment previously been angioplastied   CARDIOVASCULAR STRESS TEST  05-09-2015   [name]   low risk perfusion study/  no reversible ischemia/  normal LV  function and wall motion , ef 63%   CARDIOVERSION N/A 04/16/2022   Procedure: CARDIOVERSION;  Surgeon: Barbaraann Darryle Ned, MD;  Location: [facility] ENDOSCOPY;  Service: Cardiovascular;  Laterality: N/A;   CATARACT EXTRACTION W/ INTRAOCULAR LENS  IMPLANT, BILATERAL  left 05-06-2015/  right 06-10-2015   CORONARY ANGIOPLASTY WITH STENT PLACEMENT  01-21-2000  [name]   PTCA to pLAD/  PTCA and BMS x1 to mid LCFX/  normal LVSF   CORONARY ANGIOPLASTY WITH STENT PLACEMENT  01-10-2002  Jack charlanne   PTCA and BMS to mid AV LCFX (in-stent restenosis)/  diffuse 30-40% mRCA ,  mLAD diffuse 30%,  normal LVSF, ef 65%   CYSTOSCOPY/RETROGRADE/URETEROSCOPY/STONE EXTRACTION WITH BASKET Left 05/23/2015   Procedure: CYSTOSCOPY, FULGERATION OF PROSTATE,  LEFT URETEROSCOPY, JJ STENT PLACEMENT;  Surgeon: Donnice Brooks, MD;  Location: [facility];  Service: Urology;  Laterality: Left;   ELECTROPHYSIOLOGIC STUDY  03-30-2000   Jack fernande   EXTRACORPOREAL SHOCK WAVE LITHOTRIPSY Left 10-30-2012   EXTRACORPOREAL SHOCK WAVE LITHOTRIPSY Right 11/16/2021   Procedure: EXTRACORPOREAL SHOCK WAVE LITHOTRIPSY (ESWL);  Surgeon: Matilda Senior, MD;  Location: [facility];  Service: Urology;  Laterality: Right;   HOLMIUM LASER APPLICATION Left 05/23/2015   Procedure: WITH HOLMIUM LASER  LITHOTRIPSY;  Surgeon: Donnice Brooks, MD;  Location: [facility];  Service: Urology;  Laterality: Left;   KNEE ARTHROSCOPY Bilateral x2 right/  left 2015   LUMBAR LAMINECTOMY/DECOMPRESSION MICRODISCECTOMY N/A 09/28/2012   Procedure: L3  -L5 DECOMPRESSION L4 - L5 FUSION WITH INSTRUMENTATION 2 LEVELS;  Surgeon: Donaciano Sprang, MD;  Location: [facility] OR;  Service: Orthopedics;  Laterality: N/A;   RIGHT/LEFT HEART CATH AND CORONARY ANGIOGRAPHY N/A 01/27/2022   Procedure: RIGHT/LEFT HEART CATH AND CORONARY ANGIOGRAPHY;  Surgeon: [name] Lurena POUR, MD;  Location: [facility] INVASIVE CV LAB;  Service: Cardiovascular;  Laterality:  N/A;   TEE WITHOUT CARDIOVERSION N/A 12/11/2021   Procedure: TRANSESOPHAGEAL ECHOCARDIOGRAM (TEE);  Surgeon: [name] LABOR, MD;  Location: [facility] ENDOSCOPY;  Service: Cardiology;  Laterality: N/A;   TEE WITHOUT CARDIOVERSION N/A 03/25/2022   Procedure: TRANSESOPHAGEAL ECHOCARDIOGRAM (TEE);  Surgeon: Thukkani, Arun K, MD;  Location: [facility] INVASIVE CV LAB;  Service: Cardiovascular;  Laterality: N/A;   TONSILLECTOMY  as child   TOTAL KNEE ARTHROPLASTY Left 10/02/2018   Procedure: TOTAL LEFT  KNEE ARTHROPLASTY;  Surgeon: Ernie Donnice, MD;  Location: [facility] ORS;  Service: Orthopedics;  Laterality: Left;  90 mins- Ok per Tiffany   TOTAL SHOULDER ARTHROPLASTY Right 01/14/2011   TRANSCAROTID ARTERY REVASCULARIZATION  Right 05/09/2023   Procedure: Right Transcarotid Artery Revascularization;  Surgeon: Lanis Fonda BRAVO, MD;  Location: [facility] OR;  Service: Vascular;  Laterality: Right;   TRANSCATHETER MITRAL EDGE TO EDGE REPAIR N/A 03/25/2022   Procedure: MITRAL VALVE REPAIR;  Surgeon: [name] Lurena POUR, MD;  Location: [facility] INVASIVE CV LAB;  Service: Cardiovascular;  Laterality: N/A;   TRANSTHORACIC ECHOCARDIOGRAM  05-21-2015   mild LVH,  ef 55-605,  grade 2 diastolic dysfunction, basal inferior hyperkinesis/  moderate AV calcification sclerosis without stenosis/  trivial MR/  moderate LAE/  mild TR/  mild RAE   ULTRASOUND GUIDANCE FOR VASCULAR ACCESS Left 05/09/2023   Procedure: ULTRASOUND GUIDANCE FOR VASCULAR ACCESS, LEFT FEMORAL VEIN;  Surgeon: Lanis Fonda BRAVO, MD;  Location: [facility] OR;  Service: Vascular;  Laterality: Left;    Social History:  reports that he quit smoking about 60 years ago. His smoking use included cigarettes. He started smoking about 63 years ago. He has a 3 pack-year smoking history. He has never used smokeless tobacco. He reports that he does not drink alcohol and does not use drugs.   No Known Allergies  Family History  Problem Relation Age of Onset   Heart failure Mother    Prostate cancer  Father     Family history reviewed and not pertinent    Prior to Admission medications   Medication Sig Start Date End Date Taking? Authorizing Provider  acetaminophen  (TYLENOL ) 500 MG tablet Take 500 mg by mouth every 6 (six) hours as needed for moderate pain.   Yes [provider]  amiodarone  (PACERONE ) 200 MG tablet Take 1 tablet (200 mg total) by mouth daily. 12/15/22  Yes Rolan Ezra RAMAN, MD  amLODipine  (NORVASC ) 2.5 MG tablet Take 1 tablet (2.5 mg total) by mouth daily. 04/01/23 10/19/23 Yes Milford, Harlene HERO, FNP  apixaban  (ELIQUIS ) 5 MG TABS tablet Take 1/2 tablet by mouth twice daily 06/17/23  Yes Lavona Agent, MD  aspirin  EC 81 MG tablet Take 1 tablet (81 mg total) by mouth daily. Swallow whole. 04/30/23  Yes Patsy Lenis, MD  atorvastatin  (LIPITOR ) 80 MG tablet Take 1 tablet (80 mg total) by mouth at bedtime. 11/26/13  Yes Lavona Agent, MD  Cholecalciferol (VITAMIN D) 50  MCG (2000 UT) tablet Take 2,000 Units by mouth daily.   Yes [provider]  Cranberry 500 MG TABS Take 1 tablet by mouth at bedtime.   Yes [provider]  Cyanocobalamin (B-12) 5000 MCG CAPS Take 5,000 mcg by mouth daily.   Yes [provider]  dapagliflozin  propanediol (FARXIGA ) 10 MG TABS tablet Take 1 tablet (10 mg total) by mouth daily before breakfast. 07/06/23  Yes Rolan Ezra RAMAN, MD  ezetimibe  (ZETIA ) 10 MG tablet Take 10 mg by mouth daily. 06/09/23  Yes [provider]  ferrous sulfate  325 (65 FE) MG tablet Take 325 mg by mouth daily with breakfast.   Yes [provider]  finasteride  (PROSCAR ) 5 MG tablet Take 5 mg by mouth daily.   Yes [provider]  folic acid  (FOLVITE ) 800 MCG tablet Take 800 mcg by mouth daily.    Yes [provider]  furosemide  (LASIX ) 20 MG tablet Take 1 tablet (20 mg total) by mouth every other day. 10/01/22  Yes Lavona Agent, MD  levothyroxine  (SYNTHROID ) 50 MCG tablet Take 50 mcg by mouth every  morning. 05/13/23  Yes [provider]  meclizine (ANTIVERT) 25 MG tablet Take 25 mg by mouth daily as needed for dizziness.   Yes [provider]  niacin  500 MG tablet Take 500 mg by mouth at bedtime.   Yes [provider]  nitroGLYCERIN  (NITROSTAT ) 0.4 MG SL tablet Place 1 tablet (0.4 mg total) under the tongue every 5 (five) minutes as needed for chest pain. 04/11/23  Yes Rolan Ezra RAMAN, MD  pantoprazole  (PROTONIX ) 40 MG tablet TAKE ONE TABLET BY MOUTH EVERY DAY 05/18/23  Yes [name] Sharper, MD  traMADol  (ULTRAM ) 50 MG tablet Take 50 mg by mouth every 6 (six) hours as needed for moderate pain (pain score 4-6). 08/01/23  Yes [provider]     Objective    Physical Exam: Vitals:   08/01/23 2315 08/01/23 2330 08/02/23 0030 08/02/23 0133  BP: 129/67 (!) 155/68 (!) 156/79   Pulse: 77 81 83   Resp: 16 16 15    Temp:    98.5 F (36.9 C)  TempSrc:    Oral  SpO2: 100% 96% 96%   Weight:      Height:        General: appears to be stated age; alert, oriented Skin: warm, dry, no rash Head:  AT/ Mouth:  Oral mucosa membranes appear moist, normal dentition Neck: supple; trachea midline Heart:  RRR; did not appreciate any M/R/G Lungs: CTAB, did not appreciate any wheezes, rales, or rhonchi Abdomen: + BS; soft, ND, NT Vascular: 2+ pedal pulses b/l; 2+ radial pulses b/l Extremities: no peripheral edema, no muscle wasting Neuro: strength and sensation intact in upper and lower extremities b/l    Labs on Admission: I have personally reviewed following labs and imaging studies  CBC: Recent Labs  Lab 08/01/23 2127 08/01/23 2144  WBC 13.9*  --   NEUTROABS 11.7*  --   HGB 13.8 14.6  HCT 43.2 43.0  MCV 97.5  --   PLT 152  --    Basic Metabolic Panel: Recent Labs  Lab 08/01/23 2127 08/01/23 2144  NA 142 141  K 4.3 4.3  CL 106 106  CO2 24  --   GLUCOSE 128* 124*  BUN 25* 26*  CREATININE 1.99* 2.10*  CALCIUM  9.4  --    GFR: Estimated  Creatinine Clearance: 25.7 mL/min (A) (by C-G formula based on SCr of 2.1  mg/dL (H)). Liver Function Tests: Recent Labs  Lab 08/01/23 2127  AST 21  ALT 20  ALKPHOS 54  BILITOT 0.9  PROT 6.9  ALBUMIN  3.8   No results for input(s): LIPASE, AMYLASE in the last 168 hours. No results for input(s): AMMONIA in the last 168 hours. Coagulation Profile: Recent Labs  Lab 08/01/23 2127  INR 1.2   Cardiac Enzymes: No results for input(s): CKTOTAL, CKMB, CKMBINDEX, TROPONINI in the last 168 hours. BNP (last 3 results) No results for input(s): PROBNP in the last 8760 hours. HbA1C: No results for input(s): HGBA1C in the last 72 hours. CBG: Recent Labs  Lab 08/01/23 2119  GLUCAP 122*   Lipid Profile: No results for input(s): CHOL, HDL, LDLCALC, TRIG, CHOLHDL, LDLDIRECT in the last 72 hours. Thyroid  Function Tests: No results for input(s): TSH, T4TOTAL, FREET4, T3FREE, THYROIDAB in the last 72 hours. Anemia Panel: No results for input(s): VITAMINB12, FOLATE, FERRITIN, TIBC, IRON, RETICCTPCT in the last 72 hours. Urine analysis:    Component Value Date/Time   COLORURINE STRAW (A) 05/06/2023 1624   APPEARANCEUR CLEAR 05/06/2023 1624   LABSPEC 1.007 05/06/2023 1624   PHURINE 5.0 05/06/2023 1624   GLUCOSEU >=500 (A) 05/06/2023 1624   HGBUR NEGATIVE 05/06/2023 1624   BILIRUBINUR NEGATIVE 05/06/2023 1624   KETONESUR NEGATIVE 05/06/2023 1624   PROTEINUR NEGATIVE 05/06/2023 1624   UROBILINOGEN 0.2 05/25/2015 2115   NITRITE NEGATIVE 05/06/2023 1624   LEUKOCYTESUR NEGATIVE 05/06/2023 1624    Radiological Exams on Admission: CT L-SPINE NO CHARGE Result Date: 08/01/2023 CLINICAL DATA:  [id number] Fall [id number] EXAM: CT THORACIC AND LUMBAR SPINE WITHOUT CONTRAST TECHNIQUE: Multidetector CT imaging of the thoracic and lumbar spine was performed without contrast. Multiplanar CT image reconstructions were also generated. RADIATION DOSE REDUCTION:  This exam was performed according to the departmental dose-optimization program which includes automated exposure control, adjustment of the mA and/or kV according to patient size and/or use of iterative reconstruction technique. COMPARISON:  CT renal 10/27/2021: Chest x-ray 03/23/2022 FINDINGS: CT THORACIC SPINE FINDINGS Alignment: Normal. Vertebrae: Diffusely decreased bone density. Multilevel moderate degenerative changes spine. Mark Schmorl node along the superior endplate of T7. T5 vertebral body hemangioma. No acute fracture or focal pathologic process. Paraspinal and other soft tissues: Negative. Disc levels: Multilevel intervertebral disc space narrowing. CT LUMBAR SPINE FINDINGS Segmentation: 5 lumbar type vertebrae. Alignment: Stable grade 1 anterolisthesis of L4 on L5. Vertebrae: Diffusely decreased bone density. Multilevel moderate degenerative changes of the spine. L4-L5 posterolateral surgical hardware. No acute fracture or focal pathologic process. Paraspinal and other soft tissues: Negative. Disc levels: Multilevel intervertebral disc space vacuum phenomenon. IMPRESSION: CT THORACIC SPINE IMPRESSION No acute displaced fracture or traumatic listhesis of the thoracic spine. CT LUMBAR SPINE IMPRESSION No acute displaced fracture or traumatic listhesis of the lumbar spine. Electronically Signed   By: Morgane  Naveau M.D.   On: 08/01/2023 23:04   CT T-SPINE NO CHARGE Result Date: 08/01/2023 CLINICAL DATA:  [id number] Fall [id number] EXAM: CT THORACIC AND LUMBAR SPINE WITHOUT CONTRAST TECHNIQUE: Multidetector CT imaging of the thoracic and lumbar spine was performed without contrast. Multiplanar CT image reconstructions were also generated. RADIATION DOSE REDUCTION: This exam was performed according to the departmental dose-optimization program which includes automated exposure control, adjustment of the mA and/or kV according to patient size and/or use of iterative reconstruction technique. COMPARISON:  CT  renal 10/27/2021: Chest x-ray 03/23/2022 FINDINGS: CT THORACIC SPINE FINDINGS Alignment: Normal. Vertebrae: Diffusely decreased bone density. Multilevel moderate degenerative changes spine. Mark Schmorl node along  the superior endplate of T7. T5 vertebral body hemangioma. No acute fracture or focal pathologic process. Paraspinal and other soft tissues: Negative. Disc levels: Multilevel intervertebral disc space narrowing. CT LUMBAR SPINE FINDINGS Segmentation: 5 lumbar type vertebrae. Alignment: Stable grade 1 anterolisthesis of L4 on L5. Vertebrae: Diffusely decreased bone density. Multilevel moderate degenerative changes of the spine. L4-L5 posterolateral surgical hardware. No acute fracture or focal pathologic process. Paraspinal and other soft tissues: Negative. Disc levels: Multilevel intervertebral disc space vacuum phenomenon. IMPRESSION: CT THORACIC SPINE IMPRESSION No acute displaced fracture or traumatic listhesis of the thoracic spine. CT LUMBAR SPINE IMPRESSION No acute displaced fracture or traumatic listhesis of the lumbar spine. Electronically Signed   By: Morgane  Naveau M.D.   On: 08/01/2023 23:04   CT CHEST ABDOMEN PELVIS W CONTRAST Result Date: 08/01/2023 CLINICAL DATA:  Polytrauma, blunt Level 2 fall on thinners Pt arrived POV d/t mechanical fall. Pt is on Eliquis  and does not know if hit head. Clemens getting out bed of truck. Pt c/o right rib pain left hip and right shoulder. Fell 6 hours ago. EXAM: CT CHEST, ABDOMEN, AND PELVIS WITH CONTRAST TECHNIQUE: Multidetector CT imaging of the chest, abdomen and pelvis was performed following the standard protocol during bolus administration of intravenous contrast. RADIATION DOSE REDUCTION: This exam was performed according to the departmental dose-optimization program which includes automated exposure control, adjustment of the mA and/or kV according to patient size and/or use of iterative reconstruction technique. CONTRAST:  75mL OMNIPAQUE  IOHEXOL   350 MG/ML SOLN COMPARISON:  CT renal 10/27/2021 FINDINGS: CHEST: Cardiovascular: No aortic injury. The thoracic aorta is normal in caliber. Enlarged bilateral atria. No significant pericardial effusion. Surgical hardware along the mitral valve. Aortic valve leaflet calcification. Four-vessel coronary calcifications Mediastinum/Nodes: No pneumomediastinum. No mediastinal hematoma. The esophagus is unremarkable.  Small hiatal hernia. The thyroid  is unremarkable. The central airways are patent. No mediastinal, hilar, or axillary lymphadenopathy. Lungs/Pleura: Bilateral lower lobe atelectasis. No focal consolidation. Subpleural triangular nodules likely intrapulmonary lymph node. No pulmonary mass. No pulmonary contusion or laceration. No pneumatocele formation. No pleural effusion. No pneumothorax. No hemothorax. Musculoskeletal/Chest wall: No chest wall mass. Acute nondisplaced right 4-7 rib fractures. No acute displaced rib or sternal fracture. Total right shoulder arthroplasty. Please see separately dictated CT thoracolumbar spine. ABDOMEN / PELVIS: Hepatobiliary: Not enlarged. No focal lesion. No laceration or subcapsular hematoma. Calcified gallstones within the gallbladder lumen. No biliary ductal dilatation. Pancreas: Atrophic parenchyma. Otherwise normal pancreatic contour. No main pancreatic duct dilatation. Interval increase in size of a 2.4 x 2.5 cm (from 1.5 cm) pancreatic tail fluid density lesion. Spleen: Not enlarged. No focal lesion. No laceration, subcapsular hematoma, or vascular injury. Adrenals/Urinary Tract: No nodularity bilaterally. Bilateral kidneys enhance symmetrically. A 2.5 cm hypodense lesion within the right kidney CT stable in size with density of 32 Hounsfield units likely represents a simple renal cyst. Fluid density lesion likely represents a simple renal cyst. 3 mm calcified stone within the left kidney. Simple renal cysts, in the absence of clinically indicated signs/symptoms,  require no independent follow-up. No hydronephrosis. No contusion, laceration, or subcapsular hematoma. No injury to the vascular structures or collecting systems. No hydroureter. The urinary bladder is unremarkable. Stomach/Bowel: No small or large bowel wall thickening or dilatation. The appendix is unremarkable. Vasculature/Lymphatics: Severe atherosclerotic plaque. No abdominal aorta or iliac aneurysm. No active contrast extravasation or pseudoaneurysm. No abdominal, pelvic, inguinal lymphadenopathy. Reproductive: Enlarged prostate measuring up to 5.3 cm. Other: No simple free fluid ascites. No pneumoperitoneum. No hemoperitoneum.  No mesenteric hematoma identified. No organized fluid collection. Musculoskeletal: No significant soft tissue hematoma. Small fat containing right inguinal hernia with tethering of the urinary bladder lumen. No acute pelvic fracture. Please see separately dictated CT thoracolumbar spine. Ports and Devices: None. IMPRESSION: 1. Acute nondisplaced right 4-7 rib fractures. 2. No acute intrathoracic, intra-abdominal, intrapelvic traumatic injury. 3. Please see separately dictated CT thoracolumbar spine. Other imaging findings of potential clinical significance: 1. Interval increase in size of a 2.4 x 2.5 cm (from 1.5 cm) pancreatic tail fluid density lesion. 2. Cardiomegaly. 3. Small hiatal hernia. 4. Cholelithiasis with no acute cholecystitis. 5. Prostatomegaly. 6. Aortic Atherosclerosis (ICD10-I70.0) including four-vessel coronary calcification and aortic valve with calcification-correlate with aortic stenosis. 7. Small right inguinal hernia containing fat and mesentery tethering the urinary bladder lumen. Electronically Signed   By: Morgane  Naveau M.D.   On: 08/01/2023 22:58   CT Head Wo Contrast Result Date: 08/01/2023 CLINICAL DATA:  Head trauma, moderate-severe; Neck trauma (Age >= 65y) EXAM: CT HEAD WITHOUT CONTRAST CT CERVICAL SPINE WITHOUT CONTRAST TECHNIQUE: Multidetector CT  imaging of the head and cervical spine was performed following the standard protocol without intravenous contrast. Multiplanar CT image reconstructions of the cervical spine were also generated. RADIATION DOSE REDUCTION: This exam was performed according to the departmental dose-optimization program which includes automated exposure control, adjustment of the mA and/or kV according to patient size and/or use of iterative reconstruction technique. COMPARISON:  MRI head 04/30/2023 FINDINGS: CT HEAD FINDINGS Brain: Cerebral ventricle sizes are concordant with the degree of cerebral volume loss. Patchy and confluent areas of decreased attenuation are noted throughout the deep and periventricular white matter of the cerebral hemispheres bilaterally, compatible with chronic microvascular ischemic disease. No evidence of large-territorial acute infarction. No parenchymal hemorrhage. No mass lesion. No extra-axial collection. No mass effect or midline shift. No hydrocephalus. Basilar cisterns are patent. Vascular: No hyperdense vessel. Atherosclerotic calcifications are present within the cavernous internal carotid arteries. Skull: No acute fracture or focal lesion. Sinuses/Orbits: Paranasal sinuses and mastoid air cells are clear. Bilateral lens replacement. Otherwise the orbits are unremarkable. Other: None. CT CERVICAL SPINE FINDINGS Alignment: Normal. Skull base and vertebrae: Multilevel severe degenerative changes of the spine with posterior disc osteophyte complex formation. Associated severe bilateral C4-C5 osseous neural foraminal stenosis. No severe osseous central canal stenosis. No acute fracture. No aggressive appearing focal osseous lesion or focal pathologic process. Soft tissues and spinal canal: No prevertebral fluid or swelling. No visible canal hematoma. Upper chest: Unremarkable. Other: Atherosclerotic plaque of the carotid arteries within the neck. Right carotid artery stent. IMPRESSION: 1. No acute  intracranial abnormality. 2. No acute displaced fracture or traumatic listhesis of the cervical spine. Electronically Signed   By: Morgane  Naveau M.D.   On: 08/01/2023 22:40   CT Cervical Spine Wo Contrast Result Date: 08/01/2023 CLINICAL DATA:  Head trauma, moderate-severe; Neck trauma (Age >= 65y) EXAM: CT HEAD WITHOUT CONTRAST CT CERVICAL SPINE WITHOUT CONTRAST TECHNIQUE: Multidetector CT imaging of the head and cervical spine was performed following the standard protocol without intravenous contrast. Multiplanar CT image reconstructions of the cervical spine were also generated. RADIATION DOSE REDUCTION: This exam was performed according to the departmental dose-optimization program which includes automated exposure control, adjustment of the mA and/or kV according to patient size and/or use of iterative reconstruction technique. COMPARISON:  MRI head 04/30/2023 FINDINGS: CT HEAD FINDINGS Brain: Cerebral ventricle sizes are concordant with the degree of cerebral volume loss. Patchy and confluent areas of decreased attenuation are  noted throughout the deep and periventricular white matter of the cerebral hemispheres bilaterally, compatible with chronic microvascular ischemic disease. No evidence of large-territorial acute infarction. No parenchymal hemorrhage. No mass lesion. No extra-axial collection. No mass effect or midline shift. No hydrocephalus. Basilar cisterns are patent. Vascular: No hyperdense vessel. Atherosclerotic calcifications are present within the cavernous internal carotid arteries. Skull: No acute fracture or focal lesion. Sinuses/Orbits: Paranasal sinuses and mastoid air cells are clear. Bilateral lens replacement. Otherwise the orbits are unremarkable. Other: None. CT CERVICAL SPINE FINDINGS Alignment: Normal. Skull base and vertebrae: Multilevel severe degenerative changes of the spine with posterior disc osteophyte complex formation. Associated severe bilateral C4-C5 osseous neural  foraminal stenosis. No severe osseous central canal stenosis. No acute fracture. No aggressive appearing focal osseous lesion or focal pathologic process. Soft tissues and spinal canal: No prevertebral fluid or swelling. No visible canal hematoma. Upper chest: Unremarkable. Other: Atherosclerotic plaque of the carotid arteries within the neck. Right carotid artery stent. IMPRESSION: 1. No acute intracranial abnormality. 2. No acute displaced fracture or traumatic listhesis of the cervical spine. Electronically Signed   By: Morgane  Naveau M.D.   On: 08/01/2023 22:40   DG Pelvis Portable Result Date: 08/01/2023 CLINICAL DATA:  fall, level 2 EXAM: PORTABLE PELVIS 1-2 VIEWS COMPARISON:  CT abdomen pelvis 08/01/2023 FINDINGS: There is no evidence of pelvic fracture or diastasis. No acute displaced fracture or dislocation of either hips. No pelvic bone lesions are seen. L4-L5 surgical hardware. Vascular calcifications. IMPRESSION: Negative for acute traumatic injury. Electronically Signed   By: Morgane  Naveau M.D.   On: 08/01/2023 22:16   DG Shoulder Right Result Date: 08/01/2023 CLINICAL DATA:  Fall EXAM: RIGHT SHOULDER - 2+ VIEW COMPARISON:  None Available. FINDINGS: Total right shoulder arthroplasty. There is no evidence of fracture or dislocation. Acromioclavicular joint degenerative changes. Soft tissues are unremarkable. IMPRESSION: Negative for acute traumatic injury. Electronically Signed   By: Morgane  Naveau M.D.   On: 08/01/2023 22:15   DG Chest Portable 1 View Result Date: 08/01/2023 CLINICAL DATA:  fall, level 2 EXAM: PORTABLE CHEST 1 VIEW COMPARISON:  Chest x-ray 03/23/2022 FINDINGS: The heart and mediastinal contours are within normal limits. Atherosclerotic plaque. No focal consolidation. No pulmonary edema. No pleural effusion. No pneumothorax. No acute osseous abnormality.  Total right shoulder arthroplasty. IMPRESSION: 1. No active disease. 2.  Aortic Atherosclerosis (ICD10-I70.0).  Electronically Signed   By: Morgane  Naveau M.D.   On: 08/01/2023 22:14      Assessment/Plan   Principal Problem:   Multiple rib fractures Active Problems:   Essential hypertension   Fall at home, initial encounter   Leukocytosis   Paroxysmal atrial fibrillation (HCC)   Chronic diastolic CHF (congestive heart failure) (HCC)   Acquired hypothyroidism   BPH (benign prostatic hyperplasia)   CKD stage 3b, GFR 30-44 ml/min (HCC)     #) Multiple consecutive right-sided-sided rib fractures: Following mechanical fall off the third step of a stepladder at home experienced earlier today without loss of  consciousness, presenting imaging shows evidence of nondisplaced fractures of right sided ribs 4 through 7 in the absence of any additional evidence of acute cardiopulmonary process, including no evidence of pneumothorax.  Given the involvement of 4 consecutive ribs, the patient is being admitted for overnight observation to ensure establishment of adequate pain control so as to promote sufficient inspiratory effort in order to decrease subsequent risk for development of pneumonia should insufficient pain control not be achieved.  At this time, the patient does  not appear to be in any respiratory distress, and is maintaining oxygen saturations in the high 90s on room air. Will order prn Percocet for the anti-inflammatory benefits a/w inclusion of acetaminophen , with prn IV Dilaudid  for breakthrough pain .   Of note, EDP discussed patient's case with on-call trauma surgeon, who conveyed that the trauma surgery service will formally consult.   Plan: prn Percocet, as further described above.  Prn IV Dilaudid  for breakthrough pain. Incentive spirometry to evaluate for and promote adequate inspiratory effort in the setting of presenting multiple consecutive rib fractures, in part as a means of assessing adequacy of current level of pain control.  Check UA as part of evaluation for any underlying reversible  factors contributing to precipitating ground-level mechanical fall.  Trauma surgery team to formally consult, as above.  Fall precautions ordered.                       #) mechanical fall: mechanical fall off of the third step of a ladder at home in which the patient slipped earlier this evening in the absence of any associated loss of consciousness.  He is uncertain if he hit his head or not as component of this fall, but no evidence of gross head trauma on exam, while CT head shows no evidence of acute intracranial process, including no evidence of intracranial hemorrhage, and CT cervical spine shows no evidence of acute cervical spine fracture subluxation injury.  Notable in the setting of being chronically anticoagulated on Eliquis  in the setting of history of paroxysmal atrial fibrillation.    Appears to have resulted in multiple consecutive nondisplaced right-sided rib fractures, as further described above, in the absence of any additional overt trauma, as further outlined above.    Plan: Work-up and management of presenting multiple consecutive rib fractures, as further described above. Check UA.  Fall precautions.  PT/OT consults placed.                    #) Leukocytosis: Presenting CBC reflects mildly elevated white cell count of 13,900. Suspect that this is reactive in nature in the context of today's mechanical fall off third step of a stepladder at home with resultant multiple consecutive right-sided rib fractures. No evidence to suggest underlying infectious process at this time, including CT chest, abdomen, pelvis which showed no evidence of acute intrathoracic, acute intra-abdominal or acute intrapelvic process.  No additional SIRS criteria met at this time. Overall, criteria for sepsis are not currently met. Appears hemodynamically stable.  Therefore, will refrain from initiation of antibiotics at this time.  Plan: Repeat CBC with diff in the morning.   Monitor strict I's and O's, daily weights.  Check urinalysis.                    #) Paroxysmal atrial fibrillation: Documented history of such. In setting of CHA2DS2-VASc score of 4, there is an indication for chronic anticoagulation for thromboembolic prophylaxis. Consistent with this, patient is chronically anticoagulated on Eliquis . Home AV nodal blocking regimen: None.  Rather rhythm control strategy is pursued as an outpatient, with the patient on daily oral amiodarone  at home.  Most recent echocardiogram was performed on 03/23/2023 was notable for LVEF 55 to 60%, severe lightheaded left atrium, status post MitraClip, with residual mild mitral regurgitation noted.   Plan: monitor strict I's & O's and daily weights. CMP/CBC in AM. Check serum mag level. Continue home amiodarone .  Continue outpatient Eliquis .                    #)  Chronic diastolic heart failure: documented history of such, with most recent echocardiogram performed on 03/23/2023, with results notable for LVEF 55 to 60%, no evidence of focal Measurin  eyes, indeterminate diastolic parameters, normal right ventricular systolic function, with additional results as conveyed above. No clinical or radiographic evidence to suggest acutely decompensated heart failure at this time. home diuretic regimen reportedly consists of the following: Lasix  20 mg p.o. every 48 hours.   Plan: monitor strict I's & O's and daily weights. Repeat CMP in AM. Check serum mag level. Continue home diuretic regimen.                    #) Essential Hypertension: documented h/o such, with outpatient antihypertensive regimen including amlodipine , Lasix .  SBP's in the ED today: 120s 150s mmHg.   Plan: Close monitoring of subsequent BP via routine VS. continue outpatient amlodipine  and Lasix .  Monitor strict I's and O's and daily weights.                   #) acquired hypothyroidism: documented h/o  such, on Synthroid  as outpatient.   Plan: cont home Synthroid .                      #) Benign Prostatic Hyperplasia:  documented h/o such; on tamsulosin  Proscar  as outpatient.   Plan: monitor strict I's & O's and daily weights. Repeat CMP in AM.  Continue outpatient Proscar .                     #) CKD Stage 3B: Documented history of such, with baseline creatinine 1.6-2.0, with presenting creatinine consistent with this baseline.   Plan: Monitor strict I's and O's and daily weights.  Attempt to avoid nephrotoxic agents.  CMP/magnesium  level in the AM.     DVT prophylaxis: SCD's plus continuation of home Eliquis  Code Status: Full code Family Communication: none Disposition Plan: Per Rounding Team Consults called: EDP discussed patient's case with on-call trauma surgeon, who conveyed that the trauma surgery service will formally consult, as further detailed above;  Admission status: Inpatient     I SPENT GREATER THAN 75  MINUTES IN CLINICAL CARE TIME/MEDICAL DECISION-MAKING IN COMPLETING THIS ADMISSION.      Eva NOVAK Latoyna Hird DO [facility]  From 7PM - 7AM   08/02/2023, 2:29 AM

## 2023-08-02 NOTE — ED Notes (Signed)
Patient to CT at this time

## 2023-08-03 DIAGNOSIS — S2249XA Multiple fractures of ribs, unspecified side, initial encounter for closed fracture: Secondary | ICD-10-CM

## 2023-08-03 LAB — BASIC METABOLIC PANEL
Anion gap: 7 (ref 5–15)
BUN: 23 mg/dL (ref 8–23)
CO2: 25 mmol/L (ref 22–32)
Calcium: 8.9 mg/dL (ref 8.9–10.3)
Chloride: 106 mmol/L (ref 98–111)
Creatinine, Ser: 1.9 mg/dL — ABNORMAL HIGH (ref 0.61–1.24)
GFR, Estimated: 34 mL/min — ABNORMAL LOW (ref >60)
Glucose, Bld: 93 mg/dL (ref 70–99)
Potassium: 4.3 mmol/L (ref 3.5–5.1)
Sodium: 138 mmol/L (ref 135–145)

## 2023-08-03 LAB — CBC
HCT: 39.9 % (ref 39.0–52.0)
Hemoglobin: 13 g/dL (ref 13.0–17.0)
MCH: 31 pg (ref 26.0–34.0)
MCHC: 32.6 g/dL (ref 30.0–36.0)
MCV: 95 fL (ref 80.0–100.0)
Platelets: 120 10*3/uL — ABNORMAL LOW (ref 150–400)
RBC: 4.2 MIL/uL — ABNORMAL LOW (ref 4.22–5.81)
RDW: 14.6 % (ref 11.5–15.5)
WBC: 8.6 10*3/uL (ref 4.0–10.5)
nRBC: 0 % (ref 0.0–0.2)

## 2023-08-03 MED ORDER — APIXABAN 2.5 MG PO TABS
2.5000 mg | ORAL_TABLET | Freq: Two times a day (BID) | ORAL | Status: DC
  Administered 2023-08-03 – 2023-08-09 (×13): 2.5 mg via ORAL
  Filled 2023-08-03 (×13): qty 1

## 2023-08-03 NOTE — TOC Initial Note (Signed)
 Transition of Care [facility]) - Initial/Assessment Note    Patient Details  Name: [name] MRN: [id number] Date of Birth: 18-Nov-1935  Transition of Care [facility] CM/SW Contact:    Bridget Cordella Simmonds, LCSW Phone Number: 08/03/2023, 2:20 PM  Clinical Narrative:    CSW met with pt regarding PT recommendations for SNF.  Pt granddaughter Izetta also walked in and took part in discussion.  Pt from home with wife, no current services.  Reports he does have caregiving responsibilities for his wife.  [employer], from [place].  Pt and granddaughter are going to speak with the family to determine if it would be possible for pt to have support to DC home with [facility].  In the mean time, they are agreeable for CSW to fax referral to SNF in [place] that can take [employer].   CSW determined [facility] and [facility] do accept aetna, [facility] does not.  Referral sent out in hub for SNF.               Expected Discharge Plan: Skilled Nursing Facility Barriers to Discharge: Continued Medical Work up, SNF Pending bed offer   Patient Goals and CMS Choice Patient states their goals for this hospitalization and ongoing recovery are:: back to normal CMS Medicare.gov Compare Post Acute Care list provided to:: Patient Choice offered to / list presented to : Patient      Expected Discharge Plan and Services In-house Referral: Clinical Social Work   Post Acute Care Choice:  (TBD: SNF vs HH) Living arrangements for the past 2 months: Single Family Home                                      Prior Living Arrangements/Services Living arrangements for the past 2 months: Single Family Home Lives with:: Spouse Patient language and need for interpreter reviewed:: Yes Do you feel safe going back to the place where you live?: Yes      Need for Family Participation in Patient Care: Yes (Comment) Care giver support system in place?: Yes (comment) Current home services: Other (comment) (none) Criminal  Activity/Legal Involvement Pertinent to Current Situation/Hospitalization: No - Comment as needed  Activities of Daily Living   ADL Screening (condition at time of admission) Independently performs ADLs?: Yes (appropriate for developmental age) Is the patient deaf or have difficulty hearing?: No Does the patient have difficulty seeing, even when wearing glasses/contacts?: No Does the patient have difficulty concentrating, remembering, or making decisions?: No  Permission Sought/Granted Permission sought to share information with : Family Supports Permission granted to share information with : Yes, Verbal Permission Granted  Share Information with NAME: wife Jenkins, Daughter Tillman  Permission granted to share info w AGENCY: SNF        Emotional Assessment Appearance:: Appears stated age Attitude/Demeanor/Rapport: Engaged Affect (typically observed): Appropriate, Pleasant Orientation: : Oriented to Self, Oriented to Place, Oriented to  Time, Oriented to Situation      Admission diagnosis:  Multiple rib fractures [S22.49XA] Closed fracture of multiple ribs, unspecified laterality, initial encounter [S22.49XA] Patient Active Problem List   Diagnosis Date Noted   Multiple rib fractures 08/02/2023   Fall at home, initial encounter 08/02/2023   Leukocytosis 08/02/2023   Paroxysmal atrial fibrillation (HCC) 08/02/2023   Chronic diastolic CHF (congestive heart failure) (HCC) 08/02/2023   Acquired hypothyroidism 08/02/2023   BPH (benign prostatic hyperplasia) 08/02/2023   CKD stage 3b, GFR  30-44 ml/min (HCC) 08/02/2023   Carotid stenosis 05/09/2023   Symptomatic carotid artery stenosis, right 05/09/2023   Amaurosis fugax of right eye 04/30/2023   TIA (transient ischemic attack) 04/29/2023   CAD (coronary artery disease) 03/25/2022   S/P mitral valve clip implantation 03/25/2022   Chronic atrial fibrillation (HCC) 12/20/2021   Severe mitral regurgitation    Essential hypertension  12/02/2021   Dyslipidemia 12/02/2021   Overweight (BMI 25.0-29.9) 10/03/2018   Coronary atherosclerosis 01/05/2010   GERD 01/05/2010   Arthropathy 01/05/2010   HYPERCHOLESTEROLEMIA-PURE 03/06/2009   PCP:  Arloa Elsie SAUNDERS, MD Pharmacy:   [facility] - [place], [place] - [address] [place] [address] Phone: [phone] Fax: [phone]     Social Drivers of Health (SDOH) Social History: SDOH Screenings   Food Insecurity: No Food Insecurity (08/02/2023)  Housing: Unknown (08/02/2023)  Transportation Needs: No Transportation Needs (08/02/2023)  Utilities: Not At Risk (08/02/2023)  Tobacco Use: Medium Risk (08/02/2023)   SDOH Interventions:     Readmission Risk Interventions     No data to display

## 2023-08-03 NOTE — NC FL2 (Signed)
 [place]  MEDICAID FL2 LEVEL OF CARE FORM     IDENTIFICATION  Patient Name: [name] Birthdate: [birth year] Sex: male Admission Date (Current Location): 08/01/2023  [facility] and [place] Number:  Producer, Television/film/video and Address:  [facility], [address] [place], [place] [address]      Provider Number: [id number]  Attending Physician Name and Address:  Austria, Eric J, DO  Relative Name and Phone Number:  Genevie Hurl Daughter   [phone]    Current Level of Care: Hospital Recommended Level of Care: Skilled Nursing Facility Prior Approval Number:    Date Approved/Denied:   PASRR Number: [id number]  Discharge Plan: SNF    Current Diagnoses: Patient Active Problem List   Diagnosis Date Noted   Multiple rib fractures 08/02/2023   Fall at home, initial encounter 08/02/2023   Leukocytosis 08/02/2023   Paroxysmal atrial fibrillation (HCC) 08/02/2023   Chronic diastolic CHF (congestive heart failure) (HCC) 08/02/2023   Acquired hypothyroidism 08/02/2023   BPH (benign prostatic hyperplasia) 08/02/2023   CKD stage 3b, GFR 30-44 ml/min (HCC) 08/02/2023   Carotid stenosis 05/09/2023   Symptomatic carotid artery stenosis, right 05/09/2023   Amaurosis fugax of right eye 04/30/2023   TIA (transient ischemic attack) 04/29/2023   CAD (coronary artery disease) 03/25/2022   S/P mitral valve clip implantation 03/25/2022   Chronic atrial fibrillation (HCC) 12/20/2021   Severe mitral regurgitation    Essential hypertension 12/02/2021   Dyslipidemia 12/02/2021   Overweight (BMI 25.0-29.9) 10/03/2018   Coronary atherosclerosis 01/05/2010   GERD 01/05/2010   Arthropathy 01/05/2010   HYPERCHOLESTEROLEMIA-PURE 03/06/2009    Orientation RESPIRATION BLADDER Height & Weight     Self, Time, Situation, Place  Normal Continent Weight: 169 lb 0.8 oz (76.7 kg) Height:  5' 9 (175.3 cm)  BEHAVIORAL SYMPTOMS/MOOD NEUROLOGICAL BOWEL NUTRITION STATUS       Continent Diet (see discharge summary)  AMBULATORY STATUS COMMUNICATION OF NEEDS Skin   Total Care Verbally Other (Comment) (redness)                       Personal Care Assistance Level of Assistance  Bathing, Feeding, Dressing, Total care Bathing Assistance: Maximum assistance Feeding assistance: Independent Dressing Assistance: Maximum assistance Total Care Assistance: Maximum assistance   Functional Limitations Info  Sight, Hearing, Speech Sight Info: Adequate Hearing Info: Adequate Speech Info: Adequate    SPECIAL CARE FACTORS FREQUENCY  PT (By licensed PT), OT (By licensed OT)     PT Frequency: 5x week OT Frequency: 5x week            Contractures Contractures Info: Not present    Additional Factors Info  Code Status, Allergies Code Status Info: full Allergies Info: NKA           Current Medications (08/03/2023):  This is the current hospital active medication list Current Facility-Administered Medications  Medication Dose Route Frequency Provider Last Rate Last Admin   amiodarone  (PACERONE ) tablet 200 mg  200 mg Oral Daily Howerter, Justin B, DO   200 mg at 08/03/23 0830   amLODipine  (NORVASC ) tablet 2.5 mg  2.5 mg Oral Daily Howerter, Justin B, DO   2.5 mg at 08/03/23 0830   apixaban  (ELIQUIS ) tablet 2.5 mg  2.5 mg Oral BID Austria, Eric J, DO   2.5 mg at 08/03/23 1029   aspirin  EC tablet 81 mg  81 mg Oral Daily Howerter, Justin B, DO   81 mg at 08/03/23 0830  atorvastatin  (LIPITOR ) tablet 80 mg  80 mg Oral QHS Howerter, Justin B, DO   80 mg at 08/02/23 2045   ezetimibe  (ZETIA ) tablet 10 mg  10 mg Oral Daily Howerter, Justin B, DO   10 mg at 08/03/23 0830   finasteride  (PROSCAR ) tablet 5 mg  5 mg Oral Daily Howerter, Justin B, DO   5 mg at 08/03/23 0830   furosemide  (LASIX ) tablet 20 mg  20 mg Oral QODAY Howerter, Justin B, DO   20 mg at 08/03/23 0830   HYDROmorphone  (DILAUDID ) injection 0.5 mg  0.5 mg Intravenous Q2H PRN Howerter, Justin B, DO        levothyroxine  (SYNTHROID ) tablet 50 mcg  50 mcg Oral q morning Howerter, Justin B, DO   50 mcg at 08/03/23 9493   melatonin tablet 3 mg  3 mg Oral QHS PRN Howerter, Justin B, DO   3 mg at 08/02/23 2045   naloxone  (NARCAN ) injection 0.4 mg  0.4 mg Intravenous PRN Howerter, Justin B, DO       ondansetron  (ZOFRAN ) injection 4 mg  4 mg Intravenous Q6H PRN Howerter, Justin B, DO       oxyCODONE -acetaminophen  (PERCOCET/ROXICET) 5-325 MG per tablet 1 tablet  1 tablet Oral Q4H PRN Howerter, Justin B, DO   1 tablet at 08/03/23 0830   pantoprazole  (PROTONIX ) EC tablet 40 mg  40 mg Oral Daily Howerter, Justin B, DO   40 mg at 08/03/23 0830     Discharge Medications: Please see discharge summary for a list of discharge medications.  Relevant Imaging Results:  Relevant Lab Results:   Additional Information SSN: [id number]  Bridget Cordella Simmonds, LCSW

## 2023-08-03 NOTE — Progress Notes (Addendum)
 PROGRESS NOTE    [name]  ZOX:[id number] DOB: [birth year] DOA: 08/01/2023 PCP: [name], MD    Brief Narrative:   [name] is a 88 y.o. male with past medical history significant for paroxysmal atrial fibrillation on Eliquis, chronic diastolic congestive heart failure, HTN, severe mitral gravitation s/p mitral valve clip (August 2023), CKD stage IIIb (baseline creatinine 1.6-2.0) who presented to [facility] ED on 12/30 following mechanical fall with complaint of right-sided rib, left hip and right shoulder pain.  Patient reports he was utilizing a stepstool to get in and out of the bed of his truck when he slipped and fell onto his right side.  Patient unsure if he struck his head but denies any loss of consciousness.  Denies headache or acute neck pain.  Reports that he is compliant with his Eliquis.  Further denies palpitations, no chest pain, no diaphoresis, no nausea/vomiting, no diet dizziness, no visual changes, no fever/chills, no myalgias, no urinary symptoms.  In the ED, temperature 97.7 F, HR 66, RR 18, BP 147/86, SpO2 96% on room air.  WBC 13.9, hemoglobin 13.8, platelet count 152.  Sodium 142, potassium 4.3, chloride 106, CO2 24, glucose 120, BUN 25, creatinine 0.99.  CT head/C-spine without contrast with no acute intracranial abnormality, no acute displaced fracture or traumatic listhesis of the C-spine.  CT chest/abdomen/pelvis with acute nondisplaced right 4-7 rib fractures, no acute intrathoracic, intra-abdominal, intrapelvic traumatic injury, interval increase size of 2.4 x 2.5 cm pancreatic tail fluid density lesion, cardiomegaly, cholelithiasis without cholecystitis, prostamegaly, aortic atherosclerosis, small right inguinal hernia containing fat and mesentery.  Right shoulder negative for acute traumatic injury.  Pelvis x-ray negative for acute traumatic injury.  Chest x-ray with no active cardiopulmonary disease process.  Trauma surgery consulted.  Patient was given  fentanyl 50 mcg IV x 1, Dilaudid 0.5 mg IV x 1, NS 500 mL bolus.  TRH consulted for admission for further evaluation and management/pain control following fall resulting in right-sided rib fractures.  Assessment & Plan:   Acute nondisplaced 4th through 7th rib fractures on the right Mechanical fall Patient presenting to the ED with right-sided rib pain, right shoulder pain, left hip pain following mechanical fall after he slipped off a stepstool.  Imaging unremarkable except for CT chest/abdomen/pelvis notable for acute nondisplaced right 4-7 rib fractures without any further intra-thoracic/abdominal/intrapelvic traumatic injury.  X-ray left femur, knee, tibia/tibia but no acute osseous abnormality. -- Trauma surgery following, appreciate assistance -- Oxycodone 5 mg p.o. every 4 hours as needed moderate pain -- Dilaudid 0.5 mg IV every 2 hours as needed severe pain -- Incentive spirometry -- PT/OT recommending SNF placement, TOC consulted  Left inferior pubic ramus fracture, nondisplaced CT left hip with subtle cortical irregularity of the left inferior pubic ramus concerning for a nondisplaced fracture, no further fracture identified. -- Weightbearing as tolerated -- PT/OT as above  Pancreatic tail lesion CT abdomen/pelvis notable for increase in size of a pancreatic tail fluid density measuring now 2.4 x 2.5 cm. -- Outpatient follow-up with GI/general surgery  Paroxysmal atrial fibrillation Chronic diastolic congestive heart failure Essential hypertension -- Amlodipine 2.5 mg p.o. daily -- Amiodarone 200 mg p.o. daily -- Aspirin 81 mg p.o. daily -- Furosemide 20 mg every other day -- Eliquis 2.5 mg p.o. twice daily -- Monitor on telemetry -- Strict I's and O's and daily weights  Hyperlipidemia -- Atorvastatin 80 mg p.o. daily -- Zetia 10 mg p.o. daily  Hypothyroidism -- Levothyroxine 50 mcg p.o. daily  CKD stage IIIb Baseline creatinine 1.6-2.0.,  Creatinine on admission  2.10 at baseline. -- Cr 2.10>1.80 -- Avoid nephrotoxins, renal dose all medications  GERD -- Protonix 40 mg p.o. daily   DVT prophylaxis: apixaban (ELIQUIS) tablet 2.5 mg Start: 08/03/23 1115 SCDs Start: 08/02/23 0227 apixaban (ELIQUIS) tablet 2.5 mg    Code Status: Full Code Family Communication: No family present bedside this morning  Disposition Plan:  Level of care: Telemetry Medical Status is: Inpatient Remains inpatient appropriate because: Pain control, pending SNF placement    Consultants:  Trauma surgery, Dr. Dwain Sarna.  Procedures:  None  Antimicrobials:  None   Subjective: Patient seen examined bedside, sitting in bedside chair.  Reports pain controlled with oral medications.  Discussed recommendations of PT and OT with patient for SNF placement.  Will consult TOC, patient hopeful that he will progress in order to return home.  No other specific questions, concerns or complaints at this time. Denies headache, no visual changes, no chest pain, no palpitations, no shortness of breath, no abdominal pain, no fever/chills/night sweats, no nausea/vomiting/diarrhea, no focal weakness, no fatigue, no paresthesia.  No acute events overnight per nursing staff.   Objective: Vitals:   08/03/23 0301 08/03/23 0730 08/03/23 0924 08/03/23 1357  BP: 127/66 (!) 124/59 106/66 127/66  Pulse: 72 67 78 73  Resp: 18 18  18   Temp: 98.8 F (37.1 C) 97.9 F (36.6 C)  98 F (36.7 C)  TempSrc: Oral Oral  Oral  SpO2: 93% 94% 95% 92%  Weight:      Height:        Intake/Output Summary (Last 24 hours) at 08/03/2023 1502 Last data filed at 08/02/2023 2000 Gross per 24 hour  Intake 300 ml  Output 400 ml  Net -100 ml   Filed Weights   08/01/23 2119  Weight: 76.7 kg    Examination:  Physical Exam: GEN: NAD, alert and oriented x 3, wd/wn HEENT: NCAT, PERRL, EOMI, sclera clear, MMM PULM: CTAB w/o wheezes/crackles, normal respiratory effort, on room air CV: RRR w/o M/G/R GI:  abd soft, NTND, NABS, no R/G/M MSK: no peripheral edema, decreased active/passive range of motion left hip due to pain, left knee with old surgical incision noted, also left knee with decreased range of motion due to pain from left hip, TTP right flank area surrounding rib fractures NEURO: CN II-XII intact, no focal deficits, sensation to light touch intact PSYCH: normal mood/affect Integumentary: dry/intact, no rashes or wounds    Data Reviewed: I have personally reviewed following labs and imaging studies  CBC: Recent Labs  Lab 08/01/23 2127 08/01/23 2144 08/02/23 0540 08/03/23 0759  WBC 13.9*  --  10.5 8.6  NEUTROABS 11.7*  --  8.5*  --   HGB 13.8 14.6 12.5* 13.0  HCT 43.2 43.0 38.9* 39.9  MCV 97.5  --  97.0 95.0  PLT 152  --  117* 120*   Basic Metabolic Panel: Recent Labs  Lab 08/01/23 2127 08/01/23 2144 08/02/23 0540 08/03/23 0759  NA 142 141 139 138  K 4.3 4.3 4.1 4.3  CL 106 106 108 106  CO2 24  --  23 25  GLUCOSE 128* 124* 112* 93  BUN 25* 26* 21 23  CREATININE 1.99* 2.10* 1.81* 1.90*  CALCIUM 9.4  --  8.8* 8.9  MG  --   --  2.0  --   PHOS  --   --  3.8  --    GFR: Estimated Creatinine Clearance: 28.4 mL/min (A) (by  C-G formula based on SCr of 1.9 mg/dL (H)). Liver Function Tests: Recent Labs  Lab 08/01/23 2127 08/02/23 0540  AST 21 19  ALT 20 19  ALKPHOS 54 50  BILITOT 0.9 0.8  PROT 6.9 5.7*  ALBUMIN 3.8 3.1*   No results for input(s): "LIPASE", "AMYLASE" in the last 168 hours. No results for input(s): "AMMONIA" in the last 168 hours. Coagulation Profile: Recent Labs  Lab 08/01/23 2127  INR 1.2   Cardiac Enzymes: No results for input(s): "CKTOTAL", "CKMB", "CKMBINDEX", "TROPONINI" in the last 168 hours. BNP (last 3 results) No results for input(s): "PROBNP" in the last 8760 hours. HbA1C: No results for input(s): "HGBA1C" in the last 72 hours. CBG: Recent Labs  Lab 08/01/23 2119  GLUCAP 122*   Lipid Profile: No results for input(s):  "CHOL", "HDL", "LDLCALC", "TRIG", "CHOLHDL", "LDLDIRECT" in the last 72 hours. Thyroid Function Tests: No results for input(s): "TSH", "T4TOTAL", "FREET4", "T3FREE", "THYROIDAB" in the last 72 hours. Anemia Panel: No results for input(s): "VITAMINB12", "FOLATE", "FERRITIN", "TIBC", "IRON", "RETICCTPCT" in the last 72 hours. Sepsis Labs: No results for input(s): "PROCALCITON", "LATICACIDVEN" in the last 168 hours.  No results found for this or any previous visit (from the past 240 hours).       Radiology Studies: CT HIP LEFT WO CONTRAST Result Date: 08/02/2023 CLINICAL DATA:  Fall from ladder.  Pain. EXAM: CT OF THE LEFT HIP WITHOUT CONTRAST TECHNIQUE: Multidetector CT imaging of the left hip was performed according to the standard protocol. Multiplanar CT image reconstructions were also generated. RADIATION DOSE REDUCTION: This exam was performed according to the departmental dose-optimization program which includes automated exposure control, adjustment of the mA and/or kV according to patient size and/or use of iterative reconstruction technique. COMPARISON:  CT chest, abdomen, and pelvis dated 08/01/2023. CT abdomen/pelvis dated 10/27/2021. FINDINGS: Bones/Joint/Cartilage Subtle cortical irregularity at the medial and lateral cortices of the left inferior pubic ramus (series 3, images 103-108). Remainder of the visualized bones appear intact. The left femoral head is seated within the acetabulum. Left greater trochanteric enthesopathy. The pubic symphysis is anatomically aligned. Mild joint space narrowing and marginal osteophytosis of the left hip. Ligaments Ligaments are suboptimally evaluated by CT. Muscles and Tendons Muscles are normal for age.  No intramuscular collection identified. Soft tissue No fluid collection or hematoma. The visualized intrapelvic contents are grossly unchanged. Excreted vicarious contrast is noted within bladder. IMPRESSION: Subtle cortical irregularity of the left  inferior pubic ramus is concerning for a nondisplaced fracture. No additional fracture identified. Electronically Signed   By: Hart Robinsons M.D.   On: 08/02/2023 14:24   DG FEMUR MIN 2 VIEWS LEFT Result Date: 08/02/2023 CLINICAL DATA:  [id number] Fall from ladder [phone]. EXAM: LEFT FEMUR 2 VIEWS; LEFT KNEE - COMPLETE 4+ VIEW; LEFT TIBIA AND FIBULA - 2 VIEW COMPARISON:  None Available. FINDINGS: Bone mineralization within normal limits for patient's age. No acute fracture or dislocation. No aggressive osseous lesion. There are mild degenerative changes of the hip joint without significant joint space narrowing. Osteophytosis of the superior acetabulum. Patient is status post left total knee arthroplasty with patellar resurfacing. No periprosthetic fracture or lucency. The hardware is intact. There is near anatomic alignment. The ankle mortise is intact. Mild degenerative changes of ankle joint. No focal soft tissue swelling. No radiopaque foreign bodies. IMPRESSION: *No acute osseous abnormality of the left femur, knee or leg. Electronically Signed   By: Jules Schick M.D.   On: 08/02/2023 10:43   DG Knee  Complete 4 Views Left Result Date: 08/02/2023 CLINICAL DATA:  [id number] Fall from ladder [phone]. EXAM: LEFT FEMUR 2 VIEWS; LEFT KNEE - COMPLETE 4+ VIEW; LEFT TIBIA AND FIBULA - 2 VIEW COMPARISON:  None Available. FINDINGS: Bone mineralization within normal limits for patient's age. No acute fracture or dislocation. No aggressive osseous lesion. There are mild degenerative changes of the hip joint without significant joint space narrowing. Osteophytosis of the superior acetabulum. Patient is status post left total knee arthroplasty with patellar resurfacing. No periprosthetic fracture or lucency. The hardware is intact. There is near anatomic alignment. The ankle mortise is intact. Mild degenerative changes of ankle joint. No focal soft tissue swelling. No radiopaque foreign bodies. IMPRESSION: *No acute  osseous abnormality of the left femur, knee or leg. Electronically Signed   By: Jules Schick M.D.   On: 08/02/2023 10:43   DG Tibia/Fibula Left Result Date: 08/02/2023 CLINICAL DATA:  [id number] Fall from ladder [phone]. EXAM: LEFT FEMUR 2 VIEWS; LEFT KNEE - COMPLETE 4+ VIEW; LEFT TIBIA AND FIBULA - 2 VIEW COMPARISON:  None Available. FINDINGS: Bone mineralization within normal limits for patient's age. No acute fracture or dislocation. No aggressive osseous lesion. There are mild degenerative changes of the hip joint without significant joint space narrowing. Osteophytosis of the superior acetabulum. Patient is status post left total knee arthroplasty with patellar resurfacing. No periprosthetic fracture or lucency. The hardware is intact. There is near anatomic alignment. The ankle mortise is intact. Mild degenerative changes of ankle joint. No focal soft tissue swelling. No radiopaque foreign bodies. IMPRESSION: *No acute osseous abnormality of the left femur, knee or leg. Electronically Signed   By: Jules Schick M.D.   On: 08/02/2023 10:43   CT L-SPINE NO CHARGE Result Date: 08/01/2023 CLINICAL DATA:  [id number] Fall [id number] EXAM: CT THORACIC AND LUMBAR SPINE WITHOUT CONTRAST TECHNIQUE: Multidetector CT imaging of the thoracic and lumbar spine was performed without contrast. Multiplanar CT image reconstructions were also generated. RADIATION DOSE REDUCTION: This exam was performed according to the departmental dose-optimization program which includes automated exposure control, adjustment of the mA and/or kV according to patient size and/or use of iterative reconstruction technique. COMPARISON:  CT renal 10/27/2021: Chest x-ray 03/23/2022 FINDINGS: CT THORACIC SPINE FINDINGS Alignment: Normal. Vertebrae: Diffusely decreased bone density. Multilevel moderate degenerative changes spine. Mark Schmorl node along the superior endplate of T7. T5 vertebral body hemangioma. No acute fracture or focal pathologic  process. Paraspinal and other soft tissues: Negative. Disc levels: Multilevel intervertebral disc space narrowing. CT LUMBAR SPINE FINDINGS Segmentation: 5 lumbar type vertebrae. Alignment: Stable grade 1 anterolisthesis of L4 on L5. Vertebrae: Diffusely decreased bone density. Multilevel moderate degenerative changes of the spine. L4-L5 posterolateral surgical hardware. No acute fracture or focal pathologic process. Paraspinal and other soft tissues: Negative. Disc levels: Multilevel intervertebral disc space vacuum phenomenon. IMPRESSION: CT THORACIC SPINE IMPRESSION No acute displaced fracture or traumatic listhesis of the thoracic spine. CT LUMBAR SPINE IMPRESSION No acute displaced fracture or traumatic listhesis of the lumbar spine. Electronically Signed   By: Tish Frederickson M.D.   On: 08/01/2023 23:04   CT T-SPINE NO CHARGE Result Date: 08/01/2023 CLINICAL DATA:  [id number] Fall [id number] EXAM: CT THORACIC AND LUMBAR SPINE WITHOUT CONTRAST TECHNIQUE: Multidetector CT imaging of the thoracic and lumbar spine was performed without contrast. Multiplanar CT image reconstructions were also generated. RADIATION DOSE REDUCTION: This exam was performed according to the departmental dose-optimization program which includes automated exposure control, adjustment of the mA and/or kV according to patient  size and/or use of iterative reconstruction technique. COMPARISON:  CT renal 10/27/2021: Chest x-ray 03/23/2022 FINDINGS: CT THORACIC SPINE FINDINGS Alignment: Normal. Vertebrae: Diffusely decreased bone density. Multilevel moderate degenerative changes spine. Mark Schmorl node along the superior endplate of T7. T5 vertebral body hemangioma. No acute fracture or focal pathologic process. Paraspinal and other soft tissues: Negative. Disc levels: Multilevel intervertebral disc space narrowing. CT LUMBAR SPINE FINDINGS Segmentation: 5 lumbar type vertebrae. Alignment: Stable grade 1 anterolisthesis of L4 on L5. Vertebrae:  Diffusely decreased bone density. Multilevel moderate degenerative changes of the spine. L4-L5 posterolateral surgical hardware. No acute fracture or focal pathologic process. Paraspinal and other soft tissues: Negative. Disc levels: Multilevel intervertebral disc space vacuum phenomenon. IMPRESSION: CT THORACIC SPINE IMPRESSION No acute displaced fracture or traumatic listhesis of the thoracic spine. CT LUMBAR SPINE IMPRESSION No acute displaced fracture or traumatic listhesis of the lumbar spine. Electronically Signed   By: Tish Frederickson M.D.   On: 08/01/2023 23:04   CT CHEST ABDOMEN PELVIS W CONTRAST Result Date: 08/01/2023 CLINICAL DATA:  Polytrauma, blunt Level 2 fall on thinners Pt arrived POV d/t mechanical fall. Pt is on Eliquis and does not know if hit head. Larey Seat getting out bed of truck. Pt c/o right rib pain left hip and right shoulder. Fell 6 hours ago. EXAM: CT CHEST, ABDOMEN, AND PELVIS WITH CONTRAST TECHNIQUE: Multidetector CT imaging of the chest, abdomen and pelvis was performed following the standard protocol during bolus administration of intravenous contrast. RADIATION DOSE REDUCTION: This exam was performed according to the departmental dose-optimization program which includes automated exposure control, adjustment of the mA and/or kV according to patient size and/or use of iterative reconstruction technique. CONTRAST:  75mL OMNIPAQUE IOHEXOL 350 MG/ML SOLN COMPARISON:  CT renal 10/27/2021 FINDINGS: CHEST: Cardiovascular: No aortic injury. The thoracic aorta is normal in caliber. Enlarged bilateral atria. No significant pericardial effusion. Surgical hardware along the mitral valve. Aortic valve leaflet calcification. Four-vessel coronary calcifications Mediastinum/Nodes: No pneumomediastinum. No mediastinal hematoma. The esophagus is unremarkable.  Small hiatal hernia. The thyroid is unremarkable. The central airways are patent. No mediastinal, hilar, or axillary lymphadenopathy.  Lungs/Pleura: Bilateral lower lobe atelectasis. No focal consolidation. Subpleural triangular nodules likely intrapulmonary lymph node. No pulmonary mass. No pulmonary contusion or laceration. No pneumatocele formation. No pleural effusion. No pneumothorax. No hemothorax. Musculoskeletal/Chest wall: No chest wall mass. Acute nondisplaced right 4-7 rib fractures. No acute displaced rib or sternal fracture. Total right shoulder arthroplasty. Please see separately dictated CT thoracolumbar spine. ABDOMEN / PELVIS: Hepatobiliary: Not enlarged. No focal lesion. No laceration or subcapsular hematoma. Calcified gallstones within the gallbladder lumen. No biliary ductal dilatation. Pancreas: Atrophic parenchyma. Otherwise normal pancreatic contour. No main pancreatic duct dilatation. Interval increase in size of a 2.4 x 2.5 cm (from 1.5 cm) pancreatic tail fluid density lesion. Spleen: Not enlarged. No focal lesion. No laceration, subcapsular hematoma, or vascular injury. Adrenals/Urinary Tract: No nodularity bilaterally. Bilateral kidneys enhance symmetrically. A 2.5 cm hypodense lesion within the right kidney CT stable in size with density of 32 Hounsfield units likely represents a simple renal cyst. Fluid density lesion likely represents a simple renal cyst. 3 mm calcified stone within the left kidney. Simple renal cysts, in the absence of clinically indicated signs/symptoms, require no independent follow-up. No hydronephrosis. No contusion, laceration, or subcapsular hematoma. No injury to the vascular structures or collecting systems. No hydroureter. The urinary bladder is unremarkable. Stomach/Bowel: No small or large bowel wall thickening or dilatation. The appendix is unremarkable. Vasculature/Lymphatics: Severe atherosclerotic  plaque. No abdominal aorta or iliac aneurysm. No active contrast extravasation or pseudoaneurysm. No abdominal, pelvic, inguinal lymphadenopathy. Reproductive: Enlarged prostate measuring up  to 5.3 cm. Other: No simple free fluid ascites. No pneumoperitoneum. No hemoperitoneum. No mesenteric hematoma identified. No organized fluid collection. Musculoskeletal: No significant soft tissue hematoma. Small fat containing right inguinal hernia with tethering of the urinary bladder lumen. No acute pelvic fracture. Please see separately dictated CT thoracolumbar spine. Ports and Devices: None. IMPRESSION: 1. Acute nondisplaced right 4-7 rib fractures. 2. No acute intrathoracic, intra-abdominal, intrapelvic traumatic injury. 3. Please see separately dictated CT thoracolumbar spine. Other imaging findings of potential clinical significance: 1. Interval increase in size of a 2.4 x 2.5 cm (from 1.5 cm) pancreatic tail fluid density lesion. 2. Cardiomegaly. 3. Small hiatal hernia. 4. Cholelithiasis with no acute cholecystitis. 5. Prostatomegaly. 6. Aortic Atherosclerosis (ICD10-I70.0) including four-vessel coronary calcification and aortic valve with calcification-correlate with aortic stenosis. 7. Small right inguinal hernia containing fat and mesentery tethering the urinary bladder lumen. Electronically Signed   By: Tish Frederickson M.D.   On: 08/01/2023 22:58   CT Head Wo Contrast Result Date: 08/01/2023 CLINICAL DATA:  Head trauma, moderate-severe; Neck trauma (Age >= 65y) EXAM: CT HEAD WITHOUT CONTRAST CT CERVICAL SPINE WITHOUT CONTRAST TECHNIQUE: Multidetector CT imaging of the head and cervical spine was performed following the standard protocol without intravenous contrast. Multiplanar CT image reconstructions of the cervical spine were also generated. RADIATION DOSE REDUCTION: This exam was performed according to the departmental dose-optimization program which includes automated exposure control, adjustment of the mA and/or kV according to patient size and/or use of iterative reconstruction technique. COMPARISON:  MRI head 04/30/2023 FINDINGS: CT HEAD FINDINGS Brain: Cerebral ventricle sizes are  concordant with the degree of cerebral volume loss. Patchy and confluent areas of decreased attenuation are noted throughout the deep and periventricular white matter of the cerebral hemispheres bilaterally, compatible with chronic microvascular ischemic disease. No evidence of large-territorial acute infarction. No parenchymal hemorrhage. No mass lesion. No extra-axial collection. No mass effect or midline shift. No hydrocephalus. Basilar cisterns are patent. Vascular: No hyperdense vessel. Atherosclerotic calcifications are present within the cavernous internal carotid arteries. Skull: No acute fracture or focal lesion. Sinuses/Orbits: Paranasal sinuses and mastoid air cells are clear. Bilateral lens replacement. Otherwise the orbits are unremarkable. Other: None. CT CERVICAL SPINE FINDINGS Alignment: Normal. Skull base and vertebrae: Multilevel severe degenerative changes of the spine with posterior disc osteophyte complex formation. Associated severe bilateral C4-C5 osseous neural foraminal stenosis. No severe osseous central canal stenosis. No acute fracture. No aggressive appearing focal osseous lesion or focal pathologic process. Soft tissues and spinal canal: No prevertebral fluid or swelling. No visible canal hematoma. Upper chest: Unremarkable. Other: Atherosclerotic plaque of the carotid arteries within the neck. Right carotid artery stent. IMPRESSION: 1. No acute intracranial abnormality. 2. No acute displaced fracture or traumatic listhesis of the cervical spine. Electronically Signed   By: Tish Frederickson M.D.   On: 08/01/2023 22:40   CT Cervical Spine Wo Contrast Result Date: 08/01/2023 CLINICAL DATA:  Head trauma, moderate-severe; Neck trauma (Age >= 65y) EXAM: CT HEAD WITHOUT CONTRAST CT CERVICAL SPINE WITHOUT CONTRAST TECHNIQUE: Multidetector CT imaging of the head and cervical spine was performed following the standard protocol without intravenous contrast. Multiplanar CT image reconstructions  of the cervical spine were also generated. RADIATION DOSE REDUCTION: This exam was performed according to the departmental dose-optimization program which includes automated exposure control, adjustment of the mA and/or kV according to patient  size and/or use of iterative reconstruction technique. COMPARISON:  MRI head 04/30/2023 FINDINGS: CT HEAD FINDINGS Brain: Cerebral ventricle sizes are concordant with the degree of cerebral volume loss. Patchy and confluent areas of decreased attenuation are noted throughout the deep and periventricular white matter of the cerebral hemispheres bilaterally, compatible with chronic microvascular ischemic disease. No evidence of large-territorial acute infarction. No parenchymal hemorrhage. No mass lesion. No extra-axial collection. No mass effect or midline shift. No hydrocephalus. Basilar cisterns are patent. Vascular: No hyperdense vessel. Atherosclerotic calcifications are present within the cavernous internal carotid arteries. Skull: No acute fracture or focal lesion. Sinuses/Orbits: Paranasal sinuses and mastoid air cells are clear. Bilateral lens replacement. Otherwise the orbits are unremarkable. Other: None. CT CERVICAL SPINE FINDINGS Alignment: Normal. Skull base and vertebrae: Multilevel severe degenerative changes of the spine with posterior disc osteophyte complex formation. Associated severe bilateral C4-C5 osseous neural foraminal stenosis. No severe osseous central canal stenosis. No acute fracture. No aggressive appearing focal osseous lesion or focal pathologic process. Soft tissues and spinal canal: No prevertebral fluid or swelling. No visible canal hematoma. Upper chest: Unremarkable. Other: Atherosclerotic plaque of the carotid arteries within the neck. Right carotid artery stent. IMPRESSION: 1. No acute intracranial abnormality. 2. No acute displaced fracture or traumatic listhesis of the cervical spine. Electronically Signed   By: Tish Frederickson M.D.    On: 08/01/2023 22:40   DG Pelvis Portable Result Date: 08/01/2023 CLINICAL DATA:  fall, level 2 EXAM: PORTABLE PELVIS 1-2 VIEWS COMPARISON:  CT abdomen pelvis 08/01/2023 FINDINGS: There is no evidence of pelvic fracture or diastasis. No acute displaced fracture or dislocation of either hips. No pelvic bone lesions are seen. L4-L5 surgical hardware. Vascular calcifications. IMPRESSION: Negative for acute traumatic injury. Electronically Signed   By: Tish Frederickson M.D.   On: 08/01/2023 22:16   DG Shoulder Right Result Date: 08/01/2023 CLINICAL DATA:  Fall EXAM: RIGHT SHOULDER - 2+ VIEW COMPARISON:  None Available. FINDINGS: Total right shoulder arthroplasty. There is no evidence of fracture or dislocation. Acromioclavicular joint degenerative changes. Soft tissues are unremarkable. IMPRESSION: Negative for acute traumatic injury. Electronically Signed   By: Tish Frederickson M.D.   On: 08/01/2023 22:15   DG Chest Portable 1 View Result Date: 08/01/2023 CLINICAL DATA:  fall, level 2 EXAM: PORTABLE CHEST 1 VIEW COMPARISON:  Chest x-ray 03/23/2022 FINDINGS: The heart and mediastinal contours are within normal limits. Atherosclerotic plaque. No focal consolidation. No pulmonary edema. No pleural effusion. No pneumothorax. No acute osseous abnormality.  Total right shoulder arthroplasty. IMPRESSION: 1. No active disease. 2.  Aortic Atherosclerosis (ICD10-I70.0). Electronically Signed   By: Tish Frederickson M.D.   On: 08/01/2023 22:14        Scheduled Meds:  amiodarone  200 mg Oral Daily   amLODipine  2.5 mg Oral Daily   apixaban  2.5 mg Oral BID   aspirin EC  81 mg Oral Daily   atorvastatin  80 mg Oral QHS   ezetimibe  10 mg Oral Daily   finasteride  5 mg Oral Daily   furosemide  20 mg Oral QODAY   levothyroxine  50 mcg Oral q morning   pantoprazole  40 mg Oral Daily   Continuous Infusions:   LOS: 1 day    Time spent: 52 minutes spent on chart review, discussion with nursing staff,  consultants, updating family and interview/physical exam; more than 50% of that time was spent in counseling and/or coordination of care.    Alvira Philips [place], DO [facility] Available via Epic secure chat 7am-7pm After these hours, please refer to coverage provider listed on [email or web address] 08/03/2023, 3:02 PM

## 2023-08-03 NOTE — Plan of Care (Signed)

## 2023-08-03 NOTE — Evaluation (Signed)
 Physical Therapy Evaluation  Patient Details Name: [name] MRN: [id number] DOB: [birth year] Today's Date: 08/03/2023  History of Present Illness  Pt is an 88 y/o male who presents 08/01/23 s/p mechanical fall. He sustained a L pubic ramus fx and R rib fractures 4-7. PMH significant for paroxysmal a-fib chronically anticoagulated, chronic dHF, essential HTN, severe mitral regurgitation s/p mitral valve clip in August 2023, CKD.   Clinical Impression  Pt admitted with above diagnosis. Pt currently with functional limitations due to the deficits listed below (see PT Problem List). At the time of PT eval pt was able to perform transfers with gross CGA and +2 for safety. Pt reports feeling sick once standing and as if he is going to pass out. Suspect he was orthostatic, as BP 106/66 after ~3 minutes sitting and pt reports this is low for him. Pt reports significant improvement from yesterday - anticipate he will progress well. Acutely, pt will benefit from skilled PT to increase their independence and safety with mobility to allow discharge.           If plan is discharge home, recommend the following: A lot of help with walking and/or transfers;A lot of help with bathing/dressing/bathroom;Assistance with cooking/housework;Assist for transportation;Help with stairs or ramp for entrance   Can travel by private vehicle   No    Equipment Recommendations Rolling walker (2 wheels);BSC/3in1  Recommendations for Other Services       Functional Status Assessment Patient has had a recent decline in their functional status and demonstrates the ability to make significant improvements in function in a reasonable and predictable amount of time.     Precautions / Restrictions Precautions Precautions: Fall Precaution Comments: R rib fx's Restrictions Weight Bearing Restrictions Per Provider Order: Yes LLE Weight Bearing Per Provider Order: Weight bearing as tolerated      Mobility  Bed  Mobility Overal bed mobility: Needs Assistance Bed Mobility: Supine to Sit     Supine to sit: Contact guard     General bed mobility comments: slow but able to advance LE's towards EOB without assist. Increased time due to pain. Pt utilizing railing to roll and was able to elevate trunk to full sitting position with hands on guarding but no assist.    Transfers Overall transfer level: Needs assistance Equipment used: Rolling walker (2 wheels) Transfers: Sit to/from Stand, Bed to chair/wheelchair/BSC Sit to Stand: Contact guard assist, From elevated surface Stand pivot transfers: Contact guard assist         General transfer comment: VC's for hand placement on seated surface for safety. No assist required but hands on garding provided for safety. Pt with increased pain with turn to the L, stating he felt his leg was twisted even though LE alignment appeared to be centered. Pt then reports he feels sick and about to pass out. Chair brought up behind him to sit and BP taken: 106/66 after ~3 minutes sitting. Pt reports this is low for him and BP trend in chart supports this as well.    Ambulation/Gait               General Gait Details: Unable to progress to gait training at this time.  Stairs            Wheelchair Mobility     Tilt Bed    Modified Rankin (Stroke Patients Only)       Balance Overall balance assessment: Needs assistance, History of Falls Sitting-balance support: Single extremity supported, Feet supported  Sitting balance-Leahy Scale: Fair     Standing balance support: Bilateral upper extremity supported, During functional activity, Reliant on assistive device for balance Standing balance-Leahy Scale: Poor Standing balance comment: Able to maintain static standing balance without support. Unable to maintain dynamic standing balance unsupported and relied heavily on RW.                             Pertinent Vitals/Pain Pain  Assessment Pain Assessment: Faces Faces Pain Scale: Hurts little more Pain Location: Right ribs and Left hip Pain Descriptors / Indicators: Sore, Stabbing, Grimacing, Guarding Pain Intervention(s): Limited activity within patient's tolerance, Monitored during session, Repositioned    Home Living Family/patient expects to be discharged to:: Private residence Living Arrangements: Spouse/significant other (Pt is caregiver for wife.) Available Help at Discharge: Family;Available PRN/intermittently Type of Home: House Home Access: Stairs to enter Entrance Stairs-Rails: None Entrance Stairs-Number of Steps: 2   Home Layout: One level Home Equipment: Shower seat      Prior Function Prior Level of Function : Independent/Modified Independent;Driving             Mobility Comments: Very active ADLs Comments: Very active, 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     Extremity/Trunk Assessment   Upper Extremity Assessment Upper Extremity Assessment: Defer to OT evaluation    Lower Extremity Assessment Lower Extremity Assessment: LLE deficits/detail LLE Deficits / Details: Decreased strength and AROM (pain limiting) consistent with above mentioned pelvic fracture LLE: Unable to fully assess due to pain    Cervical / Trunk Assessment Cervical / Trunk Assessment: Other exceptions Cervical / Trunk Exceptions: Rib fractures, forward head posture with rounded shoulders  Communication   Communication Communication: No apparent difficulties  Cognition Arousal: Alert Behavior During Therapy: WFL for tasks assessed/performed Overall Cognitive Status: Within Functional Limits for tasks assessed                                          General Comments      Exercises General Exercises - Lower Extremity Long Arc Quad: 15 reps   Assessment/Plan    PT Assessment Patient needs continued PT services  PT Problem List Decreased  strength;Decreased range of motion;Decreased balance;Decreased mobility;Decreased knowledge of use of DME;Decreased safety awareness;Decreased knowledge of precautions;Pain       PT Treatment Interventions DME instruction;Gait training;Functional mobility training;Therapeutic activities;Therapeutic exercise;Balance training;Patient/family education    PT Goals (Current goals can be found in the Care Plan section)  Acute Rehab PT Goals Patient Stated Goal: Be able to return home with wife PT Goal Formulation: With patient Time For Goal Achievement: 08/17/23 Potential to Achieve Goals: Good    Frequency Min 1X/week     Co-evaluation               AM-PAC PT 6 Clicks Mobility  Outcome Measure Help needed turning from your back to your side while in a flat bed without using bedrails?: A Little Help needed moving from lying on your back to sitting on the side of a flat bed without using bedrails?: A Little Help needed moving to and from a bed to a chair (including a wheelchair)?: A Little Help needed standing up from a chair using your arms (e.g., wheelchair or bedside chair)?: A Little Help needed to walk in hospital room?: Total Help  needed climbing 3-5 steps with a railing? : Total 6 Click Score: 14    End of Session Equipment Utilized During Treatment: Gait belt Activity Tolerance: Patient tolerated treatment well Patient left: with call bell/phone within reach;in chair;with chair alarm set Nurse Communication: Mobility status PT Visit Diagnosis: Unsteadiness on feet (R26.81);Pain Pain - Right/Left: Left Pain - part of body: Hip    Time: 9093-9073 PT Time Calculation (min) (ACUTE ONLY): 20 min   Charges:   PT Evaluation $PT Eval Moderate Complexity: 1 Mod   PT General Charges $$ ACUTE PT VISIT: 1 Visit         Leita Sable, PT, DPT Acute Rehabilitation Services Secure Chat Preferred Office: [phone]   Leita JONETTA Sable 08/03/2023, 10:16 AM

## 2023-08-03 NOTE — Progress Notes (Signed)
 Subjective/Chief Complaint: sore   Objective: Vital signs in last 24 hours: Temp:  [97.8 F (36.6 C)-98.8 F (37.1 C)] 97.9 F (36.6 C) (01/01 0730) Pulse Rate:  [67-87] 67 (01/01 0730) Resp:  [16-20] 18 (01/01 0730) BP: (124-149)/(59-76) 124/59 (01/01 0730) SpO2:  [91 %-99 %] 94 % (01/01 0730) Last BM Date : 08/01/23  Intake/Output from previous day: 12/31 0701 - 01/01 0700 In: 300 [P.O.:300] Out: 525 [Urine:525] Intake/Output this shift: No intake/output data recorded.  General nad Cv regular Pulm effort normal, ribs tender Ab soft nontender Left le able to move more today, sore, distally nvi  Lab Results:  Recent Labs    08/01/23 2127 08/01/23 2144 08/02/23 0540  WBC 13.9*  --  10.5  HGB 13.8 14.6 12.5*  HCT 43.2 43.0 38.9*  PLT 152  --  117*   BMET Recent Labs    08/01/23 2127 08/01/23 2144 08/02/23 0540  NA 142 141 139  K 4.3 4.3 4.1  CL 106 106 108  CO2 24  --  23  GLUCOSE 128* 124* 112*  BUN 25* 26* 21  CREATININE 1.99* 2.10* 1.81*  CALCIUM  9.4  --  8.8*   PT/INR Recent Labs    08/01/23 2127  LABPROT 14.9  INR 1.2   ABG No results for input(s): PHART, HCO3 in the last 72 hours.  Invalid input(s): PCO2, PO2  Studies/Results: CT HIP LEFT WO CONTRAST Result Date: 08/02/2023 CLINICAL DATA:  Fall from ladder.  Pain. EXAM: CT OF THE LEFT HIP WITHOUT CONTRAST TECHNIQUE: Multidetector CT imaging of the left hip was performed according to the standard protocol. Multiplanar CT image reconstructions were also generated. RADIATION DOSE REDUCTION: This exam was performed according to the departmental dose-optimization program which includes automated exposure control, adjustment of the mA and/or kV according to patient size and/or use of iterative reconstruction technique. COMPARISON:  CT chest, abdomen, and pelvis dated 08/01/2023. CT abdomen/pelvis dated 10/27/2021. FINDINGS: Bones/Joint/Cartilage Subtle cortical irregularity at the  medial and lateral cortices of the left inferior pubic ramus (series 3, images 103-108). Remainder of the visualized bones appear intact. The left femoral head is seated within the acetabulum. Left greater trochanteric enthesopathy. The pubic symphysis is anatomically aligned. Mild joint space narrowing and marginal osteophytosis of the left hip. Ligaments Ligaments are suboptimally evaluated by CT. Muscles and Tendons Muscles are normal for age.  No intramuscular collection identified. Soft tissue No fluid collection or hematoma. The visualized intrapelvic contents are grossly unchanged. Excreted vicarious contrast is noted within bladder. IMPRESSION: Subtle cortical irregularity of the left inferior pubic ramus is concerning for a nondisplaced fracture. No additional fracture identified. Electronically Signed   By: Harrietta Sherry M.D.   On: 08/02/2023 14:24   DG FEMUR MIN 2 VIEWS LEFT Result Date: 08/02/2023 CLINICAL DATA:  [id number] Fall from ladder [phone]. EXAM: LEFT FEMUR 2 VIEWS; LEFT KNEE - COMPLETE 4+ VIEW; LEFT TIBIA AND FIBULA - 2 VIEW COMPARISON:  None Available. FINDINGS: Bone mineralization within normal limits for patient's age. No acute fracture or dislocation. No aggressive osseous lesion. There are mild degenerative changes of the hip joint without significant joint space narrowing. Osteophytosis of the superior acetabulum. Patient is status post left total knee arthroplasty with patellar resurfacing. No periprosthetic fracture or lucency. The hardware is intact. There is near anatomic alignment. The ankle mortise is intact. Mild degenerative changes of ankle joint. No focal soft tissue swelling. No radiopaque foreign bodies. IMPRESSION: *No acute osseous abnormality of the left femur, knee  or leg. Electronically Signed   By: Ree Molt M.D.   On: 08/02/2023 10:43   DG Knee Complete 4 Views Left Result Date: 08/02/2023 CLINICAL DATA:  [id number] Fall from ladder [phone]. EXAM: LEFT FEMUR 2  VIEWS; LEFT KNEE - COMPLETE 4+ VIEW; LEFT TIBIA AND FIBULA - 2 VIEW COMPARISON:  None Available. FINDINGS: Bone mineralization within normal limits for patient's age. No acute fracture or dislocation. No aggressive osseous lesion. There are mild degenerative changes of the hip joint without significant joint space narrowing. Osteophytosis of the superior acetabulum. Patient is status post left total knee arthroplasty with patellar resurfacing. No periprosthetic fracture or lucency. The hardware is intact. There is near anatomic alignment. The ankle mortise is intact. Mild degenerative changes of ankle joint. No focal soft tissue swelling. No radiopaque foreign bodies. IMPRESSION: *No acute osseous abnormality of the left femur, knee or leg. Electronically Signed   By: Ree Molt M.D.   On: 08/02/2023 10:43   DG Tibia/Fibula Left Result Date: 08/02/2023 CLINICAL DATA:  [id number] Fall from ladder [phone]. EXAM: LEFT FEMUR 2 VIEWS; LEFT KNEE - COMPLETE 4+ VIEW; LEFT TIBIA AND FIBULA - 2 VIEW COMPARISON:  None Available. FINDINGS: Bone mineralization within normal limits for patient's age. No acute fracture or dislocation. No aggressive osseous lesion. There are mild degenerative changes of the hip joint without significant joint space narrowing. Osteophytosis of the superior acetabulum. Patient is status post left total knee arthroplasty with patellar resurfacing. No periprosthetic fracture or lucency. The hardware is intact. There is near anatomic alignment. The ankle mortise is intact. Mild degenerative changes of ankle joint. No focal soft tissue swelling. No radiopaque foreign bodies. IMPRESSION: *No acute osseous abnormality of the left femur, knee or leg. Electronically Signed   By: Ree Molt M.D.   On: 08/02/2023 10:43   CT L-SPINE NO CHARGE Result Date: 08/01/2023 CLINICAL DATA:  [id number] Fall [id number] EXAM: CT THORACIC AND LUMBAR SPINE WITHOUT CONTRAST TECHNIQUE: Multidetector CT imaging of the  thoracic and lumbar spine was performed without contrast. Multiplanar CT image reconstructions were also generated. RADIATION DOSE REDUCTION: This exam was performed according to the departmental dose-optimization program which includes automated exposure control, adjustment of the mA and/or kV according to patient size and/or use of iterative reconstruction technique. COMPARISON:  CT renal 10/27/2021: Chest x-ray 03/23/2022 FINDINGS: CT THORACIC SPINE FINDINGS Alignment: Normal. Vertebrae: Diffusely decreased bone density. Multilevel moderate degenerative changes spine. Mark Schmorl node along the superior endplate of T7. T5 vertebral body hemangioma. No acute fracture or focal pathologic process. Paraspinal and other soft tissues: Negative. Disc levels: Multilevel intervertebral disc space narrowing. CT LUMBAR SPINE FINDINGS Segmentation: 5 lumbar type vertebrae. Alignment: Stable grade 1 anterolisthesis of L4 on L5. Vertebrae: Diffusely decreased bone density. Multilevel moderate degenerative changes of the spine. L4-L5 posterolateral surgical hardware. No acute fracture or focal pathologic process. Paraspinal and other soft tissues: Negative. Disc levels: Multilevel intervertebral disc space vacuum phenomenon. IMPRESSION: CT THORACIC SPINE IMPRESSION No acute displaced fracture or traumatic listhesis of the thoracic spine. CT LUMBAR SPINE IMPRESSION No acute displaced fracture or traumatic listhesis of the lumbar spine. Electronically Signed   By: Morgane  Naveau M.D.   On: 08/01/2023 23:04   CT T-SPINE NO CHARGE Result Date: 08/01/2023 CLINICAL DATA:  [id number] Fall [id number] EXAM: CT THORACIC AND LUMBAR SPINE WITHOUT CONTRAST TECHNIQUE: Multidetector CT imaging of the thoracic and lumbar spine was performed without contrast. Multiplanar CT image reconstructions were also generated. RADIATION DOSE REDUCTION: This exam was performed  according to the departmental dose-optimization program which includes automated  exposure control, adjustment of the mA and/or kV according to patient size and/or use of iterative reconstruction technique. COMPARISON:  CT renal 10/27/2021: Chest x-ray 03/23/2022 FINDINGS: CT THORACIC SPINE FINDINGS Alignment: Normal. Vertebrae: Diffusely decreased bone density. Multilevel moderate degenerative changes spine. Mark Schmorl node along the superior endplate of T7. T5 vertebral body hemangioma. No acute fracture or focal pathologic process. Paraspinal and other soft tissues: Negative. Disc levels: Multilevel intervertebral disc space narrowing. CT LUMBAR SPINE FINDINGS Segmentation: 5 lumbar type vertebrae. Alignment: Stable grade 1 anterolisthesis of L4 on L5. Vertebrae: Diffusely decreased bone density. Multilevel moderate degenerative changes of the spine. L4-L5 posterolateral surgical hardware. No acute fracture or focal pathologic process. Paraspinal and other soft tissues: Negative. Disc levels: Multilevel intervertebral disc space vacuum phenomenon. IMPRESSION: CT THORACIC SPINE IMPRESSION No acute displaced fracture or traumatic listhesis of the thoracic spine. CT LUMBAR SPINE IMPRESSION No acute displaced fracture or traumatic listhesis of the lumbar spine. Electronically Signed   By: Morgane  Naveau M.D.   On: 08/01/2023 23:04   CT CHEST ABDOMEN PELVIS W CONTRAST Result Date: 08/01/2023 CLINICAL DATA:  Polytrauma, blunt Level 2 fall on thinners Pt arrived POV d/t mechanical fall. Pt is on Eliquis  and does not know if hit head. Clemens getting out bed of truck. Pt c/o right rib pain left hip and right shoulder. Fell 6 hours ago. EXAM: CT CHEST, ABDOMEN, AND PELVIS WITH CONTRAST TECHNIQUE: Multidetector CT imaging of the chest, abdomen and pelvis was performed following the standard protocol during bolus administration of intravenous contrast. RADIATION DOSE REDUCTION: This exam was performed according to the departmental dose-optimization program which includes automated exposure control,  adjustment of the mA and/or kV according to patient size and/or use of iterative reconstruction technique. CONTRAST:  75mL OMNIPAQUE  IOHEXOL  350 MG/ML SOLN COMPARISON:  CT renal 10/27/2021 FINDINGS: CHEST: Cardiovascular: No aortic injury. The thoracic aorta is normal in caliber. Enlarged bilateral atria. No significant pericardial effusion. Surgical hardware along the mitral valve. Aortic valve leaflet calcification. Four-vessel coronary calcifications Mediastinum/Nodes: No pneumomediastinum. No mediastinal hematoma. The esophagus is unremarkable.  Small hiatal hernia. The thyroid  is unremarkable. The central airways are patent. No mediastinal, hilar, or axillary lymphadenopathy. Lungs/Pleura: Bilateral lower lobe atelectasis. No focal consolidation. Subpleural triangular nodules likely intrapulmonary lymph node. No pulmonary mass. No pulmonary contusion or laceration. No pneumatocele formation. No pleural effusion. No pneumothorax. No hemothorax. Musculoskeletal/Chest wall: No chest wall mass. Acute nondisplaced right 4-7 rib fractures. No acute displaced rib or sternal fracture. Total right shoulder arthroplasty. Please see separately dictated CT thoracolumbar spine. ABDOMEN / PELVIS: Hepatobiliary: Not enlarged. No focal lesion. No laceration or subcapsular hematoma. Calcified gallstones within the gallbladder lumen. No biliary ductal dilatation. Pancreas: Atrophic parenchyma. Otherwise normal pancreatic contour. No main pancreatic duct dilatation. Interval increase in size of a 2.4 x 2.5 cm (from 1.5 cm) pancreatic tail fluid density lesion. Spleen: Not enlarged. No focal lesion. No laceration, subcapsular hematoma, or vascular injury. Adrenals/Urinary Tract: No nodularity bilaterally. Bilateral kidneys enhance symmetrically. A 2.5 cm hypodense lesion within the right kidney CT stable in size with density of 32 Hounsfield units likely represents a simple renal cyst. Fluid density lesion likely represents a  simple renal cyst. 3 mm calcified stone within the left kidney. Simple renal cysts, in the absence of clinically indicated signs/symptoms, require no independent follow-up. No hydronephrosis. No contusion, laceration, or subcapsular hematoma. No injury to the vascular structures or collecting systems. No hydroureter. The  urinary bladder is unremarkable. Stomach/Bowel: No small or large bowel wall thickening or dilatation. The appendix is unremarkable. Vasculature/Lymphatics: Severe atherosclerotic plaque. No abdominal aorta or iliac aneurysm. No active contrast extravasation or pseudoaneurysm. No abdominal, pelvic, inguinal lymphadenopathy. Reproductive: Enlarged prostate measuring up to 5.3 cm. Other: No simple free fluid ascites. No pneumoperitoneum. No hemoperitoneum. No mesenteric hematoma identified. No organized fluid collection. Musculoskeletal: No significant soft tissue hematoma. Small fat containing right inguinal hernia with tethering of the urinary bladder lumen. No acute pelvic fracture. Please see separately dictated CT thoracolumbar spine. Ports and Devices: None. IMPRESSION: 1. Acute nondisplaced right 4-7 rib fractures. 2. No acute intrathoracic, intra-abdominal, intrapelvic traumatic injury. 3. Please see separately dictated CT thoracolumbar spine. Other imaging findings of potential clinical significance: 1. Interval increase in size of a 2.4 x 2.5 cm (from 1.5 cm) pancreatic tail fluid density lesion. 2. Cardiomegaly. 3. Small hiatal hernia. 4. Cholelithiasis with no acute cholecystitis. 5. Prostatomegaly. 6. Aortic Atherosclerosis (ICD10-I70.0) including four-vessel coronary calcification and aortic valve with calcification-correlate with aortic stenosis. 7. Small right inguinal hernia containing fat and mesentery tethering the urinary bladder lumen. Electronically Signed   By: Morgane  Naveau M.D.   On: 08/01/2023 22:58   CT Head Wo Contrast Result Date: 08/01/2023 CLINICAL DATA:  Head  trauma, moderate-severe; Neck trauma (Age >= 65y) EXAM: CT HEAD WITHOUT CONTRAST CT CERVICAL SPINE WITHOUT CONTRAST TECHNIQUE: Multidetector CT imaging of the head and cervical spine was performed following the standard protocol without intravenous contrast. Multiplanar CT image reconstructions of the cervical spine were also generated. RADIATION DOSE REDUCTION: This exam was performed according to the departmental dose-optimization program which includes automated exposure control, adjustment of the mA and/or kV according to patient size and/or use of iterative reconstruction technique. COMPARISON:  MRI head 04/30/2023 FINDINGS: CT HEAD FINDINGS Brain: Cerebral ventricle sizes are concordant with the degree of cerebral volume loss. Patchy and confluent areas of decreased attenuation are noted throughout the deep and periventricular white matter of the cerebral hemispheres bilaterally, compatible with chronic microvascular ischemic disease. No evidence of large-territorial acute infarction. No parenchymal hemorrhage. No mass lesion. No extra-axial collection. No mass effect or midline shift. No hydrocephalus. Basilar cisterns are patent. Vascular: No hyperdense vessel. Atherosclerotic calcifications are present within the cavernous internal carotid arteries. Skull: No acute fracture or focal lesion. Sinuses/Orbits: Paranasal sinuses and mastoid air cells are clear. Bilateral lens replacement. Otherwise the orbits are unremarkable. Other: None. CT CERVICAL SPINE FINDINGS Alignment: Normal. Skull base and vertebrae: Multilevel severe degenerative changes of the spine with posterior disc osteophyte complex formation. Associated severe bilateral C4-C5 osseous neural foraminal stenosis. No severe osseous central canal stenosis. No acute fracture. No aggressive appearing focal osseous lesion or focal pathologic process. Soft tissues and spinal canal: No prevertebral fluid or swelling. No visible canal hematoma. Upper  chest: Unremarkable. Other: Atherosclerotic plaque of the carotid arteries within the neck. Right carotid artery stent. IMPRESSION: 1. No acute intracranial abnormality. 2. No acute displaced fracture or traumatic listhesis of the cervical spine. Electronically Signed   By: Morgane  Naveau M.D.   On: 08/01/2023 22:40   CT Cervical Spine Wo Contrast Result Date: 08/01/2023 CLINICAL DATA:  Head trauma, moderate-severe; Neck trauma (Age >= 65y) EXAM: CT HEAD WITHOUT CONTRAST CT CERVICAL SPINE WITHOUT CONTRAST TECHNIQUE: Multidetector CT imaging of the head and cervical spine was performed following the standard protocol without intravenous contrast. Multiplanar CT image reconstructions of the cervical spine were also generated. RADIATION DOSE REDUCTION: This exam was performed  according to the departmental dose-optimization program which includes automated exposure control, adjustment of the mA and/or kV according to patient size and/or use of iterative reconstruction technique. COMPARISON:  MRI head 04/30/2023 FINDINGS: CT HEAD FINDINGS Brain: Cerebral ventricle sizes are concordant with the degree of cerebral volume loss. Patchy and confluent areas of decreased attenuation are noted throughout the deep and periventricular white matter of the cerebral hemispheres bilaterally, compatible with chronic microvascular ischemic disease. No evidence of large-territorial acute infarction. No parenchymal hemorrhage. No mass lesion. No extra-axial collection. No mass effect or midline shift. No hydrocephalus. Basilar cisterns are patent. Vascular: No hyperdense vessel. Atherosclerotic calcifications are present within the cavernous internal carotid arteries. Skull: No acute fracture or focal lesion. Sinuses/Orbits: Paranasal sinuses and mastoid air cells are clear. Bilateral lens replacement. Otherwise the orbits are unremarkable. Other: None. CT CERVICAL SPINE FINDINGS Alignment: Normal. Skull base and vertebrae:  Multilevel severe degenerative changes of the spine with posterior disc osteophyte complex formation. Associated severe bilateral C4-C5 osseous neural foraminal stenosis. No severe osseous central canal stenosis. No acute fracture. No aggressive appearing focal osseous lesion or focal pathologic process. Soft tissues and spinal canal: No prevertebral fluid or swelling. No visible canal hematoma. Upper chest: Unremarkable. Other: Atherosclerotic plaque of the carotid arteries within the neck. Right carotid artery stent. IMPRESSION: 1. No acute intracranial abnormality. 2. No acute displaced fracture or traumatic listhesis of the cervical spine. Electronically Signed   By: Morgane  Naveau M.D.   On: 08/01/2023 22:40   DG Pelvis Portable Result Date: 08/01/2023 CLINICAL DATA:  fall, level 2 EXAM: PORTABLE PELVIS 1-2 VIEWS COMPARISON:  CT abdomen pelvis 08/01/2023 FINDINGS: There is no evidence of pelvic fracture or diastasis. No acute displaced fracture or dislocation of either hips. No pelvic bone lesions are seen. L4-L5 surgical hardware. Vascular calcifications. IMPRESSION: Negative for acute traumatic injury. Electronically Signed   By: Morgane  Naveau M.D.   On: 08/01/2023 22:16   DG Shoulder Right Result Date: 08/01/2023 CLINICAL DATA:  Fall EXAM: RIGHT SHOULDER - 2+ VIEW COMPARISON:  None Available. FINDINGS: Total right shoulder arthroplasty. There is no evidence of fracture or dislocation. Acromioclavicular joint degenerative changes. Soft tissues are unremarkable. IMPRESSION: Negative for acute traumatic injury. Electronically Signed   By: Morgane  Naveau M.D.   On: 08/01/2023 22:15   DG Chest Portable 1 View Result Date: 08/01/2023 CLINICAL DATA:  fall, level 2 EXAM: PORTABLE CHEST 1 VIEW COMPARISON:  Chest x-ray 03/23/2022 FINDINGS: The heart and mediastinal contours are within normal limits. Atherosclerotic plaque. No focal consolidation. No pulmonary edema. No pleural effusion. No  pneumothorax. No acute osseous abnormality.  Total right shoulder arthroplasty. IMPRESSION: 1. No active disease. 2.  Aortic Atherosclerosis (ICD10-I70.0). Electronically Signed   By: Morgane  Naveau M.D.   On: 08/01/2023 22:14    Anti-infectives: Anti-infectives (From admission, onward)    None       Assessment/Plan: Fall from ladder Right rib fx- pulm toilet, pain control Left hip/le pain- films and ct negative some question of rami fx, nothing more than pain control needed Panc tail lesion- outpatient follow up Dvt prophylaxis recommended, can restart eliquis  whenever now Pt/ot for dispo  I reviewed hospitalist notes, last 24 h vitals and pain scores, last 48 h intake and output, last 24 h labs and trends, and last 24 h imaging results.      [name] 08/03/2023

## 2023-08-04 DIAGNOSIS — S2241XA Multiple fractures of ribs, right side, initial encounter for closed fracture: Secondary | ICD-10-CM | POA: Diagnosis not present

## 2023-08-04 DIAGNOSIS — S32592A Other specified fracture of left pubis, initial encounter for closed fracture: Secondary | ICD-10-CM | POA: Diagnosis not present

## 2023-08-04 MED ORDER — POLYETHYLENE GLYCOL 3350 17 G PO PACK
17.0000 g | PACK | Freq: Every day | ORAL | Status: DC | PRN
  Administered 2023-08-04 – 2023-08-06 (×3): 17 g via ORAL
  Filled 2023-08-04 (×3): qty 1

## 2023-08-04 MED ORDER — SENNOSIDES-DOCUSATE SODIUM 8.6-50 MG PO TABS
2.0000 | ORAL_TABLET | Freq: Two times a day (BID) | ORAL | Status: DC
  Administered 2023-08-04 – 2023-08-06 (×5): 2 via ORAL
  Filled 2023-08-04 (×5): qty 2

## 2023-08-04 NOTE — TOC Progression Note (Addendum)
 Transition of Care [facility]) - Progression Note    Patient Details  Name: [name] MRN: [id number] Date of Birth: 10-24-1935  [facility] CM/SW Contact  Bridget Cordella Simmonds, LCSW Phone Number: 08/04/2023, 1:45 PM  Clinical Narrative:   CSW informed pt that [facility] and [facility] have both offered beds.  He does want to accept offer at [facility].    CSW confirmed with Tracy/[facility].  1330: CSW spoke with pt daughter Tillman.  She was aware that pt planning to DC to [facility] and is in agreement with this plan.  Availaty website for [employer] currently down.  Cannot request SNF auth.    1545: Angela/TOC CMA was able to submit auth request, [employer] system back up.   Expected Discharge Plan: Skilled Nursing Facility Barriers to Discharge: Continued Medical Work up, SNF Pending bed offer  Expected Discharge Plan and Services In-house Referral: Clinical Social Work   Post Acute Care Choice:  (TBD: SNF vs HH) Living arrangements for the past 2 months: Single Family Home                                       Social Determinants of Health (SDOH) Interventions SDOH Screenings   Food Insecurity: No Food Insecurity (08/02/2023)  Housing: Unknown (08/02/2023)  Transportation Needs: No Transportation Needs (08/02/2023)  Utilities: Not At Risk (08/02/2023)  Tobacco Use: Medium Risk (08/02/2023)    Readmission Risk Interventions     No data to display

## 2023-08-04 NOTE — Plan of Care (Signed)

## 2023-08-04 NOTE — Progress Notes (Signed)
 Physical Therapy Treatment  Patient Details Name: [name] MRN: [id number] DOB: 06-Jan-1936 Today's Date: 08/04/2023   History of Present Illness Pt is an 88 y/o male who presents 08/01/23 s/p mechanical fall. He sustained a L pubic ramus fx and R rib fractures 4-7. PMH significant for paroxysmal a-fib chronically anticoagulated, chronic dHF, essential HTN, severe mitral regurgitation s/p mitral valve clip in August 2023, CKD.    PT Comments  Pt progressing towards physical therapy goals. Was able to perform transfers and ~5' ambulation with CGA and RW for support. Pt reports feeling like he does not have the strength in his arms to fully off weight his LLE with the RW, however was able to tolerate more weight bearing this session than last. Of note, pt with orthostatic hypotension. See below for details. Post-acute therapy recs remain appropriate.   Orthostatic BPs  Supine 126/64  Sitting 124/59  Standing 106/66  Standing after walk 71/42  Sitting at end of session with LE's elevated 147/66       If plan is discharge home, recommend the following: A lot of help with walking and/or transfers;A lot of help with bathing/dressing/bathroom;Assistance with cooking/housework;Assist for transportation;Help with stairs or ramp for entrance   Can travel by private vehicle     No  Equipment Recommendations  Rolling walker (2 wheels);BSC/3in1    Recommendations for Other Services       Precautions / Restrictions Precautions Precautions: Fall Precaution Comments: R rib fx's, orthostatic Restrictions Weight Bearing Restrictions Per Provider Order: Yes LLE Weight Bearing Per Provider Order: Weight bearing as tolerated     Mobility  Bed Mobility Overal bed mobility: Needs Assistance Bed Mobility: Supine to Sit     Supine to sit: Contact guard     General bed mobility comments: Pt with greater active movement of LE's this session and was able to transition to EOB with CGA. No  physical assist required.    Transfers Overall transfer level: Needs assistance Equipment used: Rolling walker (2 wheels) Transfers: Sit to/from Stand Sit to Stand: Contact guard assist, From elevated surface           General transfer comment: VC's for hand placement on seated surface for safety. No assist required but hands on guarding provided for safety.    Ambulation/Gait Ambulation/Gait assistance: Contact guard assist Gait Distance (Feet): 5 Feet Assistive device: Rolling walker (2 wheels) Gait Pattern/deviations: Step-to pattern, Decreased stride length, Trunk flexed, Narrow base of support Gait velocity: Decreased Gait velocity interpretation: <1.31 ft/sec, indicative of household ambulator   General Gait Details: Slow and effortful steps but able to ambulate about 5 feet before reporting feeling lightheaded. Ambulation ended and BP taken 71/42   Stairs             Wheelchair Mobility     Tilt Bed    Modified Rankin (Stroke Patients Only)       Balance Overall balance assessment: Needs assistance, History of Falls Sitting-balance support: Single extremity supported, Feet supported Sitting balance-Leahy Scale: Fair     Standing balance support: Bilateral upper extremity supported, During functional activity, Reliant on assistive device for balance Standing balance-Leahy Scale: Poor Standing balance comment: Able to maintain static standing balance without support. Unable to maintain dynamic standing balance unsupported and relied heavily on RW.                            Cognition Arousal: Alert Behavior During Therapy: [facility] for  tasks assessed/performed Overall Cognitive Status: Within Functional Limits for tasks assessed                                          Exercises      General Comments        Pertinent Vitals/Pain Pain Assessment Pain Assessment: Faces Faces Pain Scale: Hurts little more Pain  Location: Right ribs and Left hip Pain Descriptors / Indicators: Sore, Stabbing, Grimacing, Guarding Pain Intervention(s): Limited activity within patient's tolerance, Monitored during session, Repositioned    Home Living Family/patient expects to be discharged to:: Private residence Living Arrangements: Spouse/significant other (Pt is caregiver for wife.) Available Help at Discharge: Family;Available PRN/intermittently Type of Home: House Home Access: Stairs to enter Entrance Stairs-Rails: None Entrance Stairs-Number of Steps: 2   Home Layout: One level Home Equipment: Shower seat      Prior Function            PT Goals (current goals can now be found in the care plan section) Acute Rehab PT Goals Patient Stated Goal: Be able to return home with wife PT Goal Formulation: With patient Time For Goal Achievement: 08/17/23 Potential to Achieve Goals: Good Progress towards PT goals: Progressing toward goals    Frequency    Min 1X/week      PT Plan      Co-evaluation              AM-PAC PT 6 Clicks Mobility   Outcome Measure  Help needed turning from your back to your side while in a flat bed without using bedrails?: A Little Help needed moving from lying on your back to sitting on the side of a flat bed without using bedrails?: A Little Help needed moving to and from a bed to a chair (including a wheelchair)?: A Little Help needed standing up from a chair using your arms (e.g., wheelchair or bedside chair)?: A Little Help needed to walk in hospital room?: Total Help needed climbing 3-5 steps with a railing? : Total 6 Click Score: 14    End of Session Equipment Utilized During Treatment: Gait belt Activity Tolerance: Patient tolerated treatment well Patient left: with call bell/phone within reach;in chair;with chair alarm set Nurse Communication: Mobility status PT Visit Diagnosis: Unsteadiness on feet (R26.81);Pain Pain - Right/Left: Left Pain - part of  body: Hip     Time: 0929-1000 PT Time Calculation (min) (ACUTE ONLY): 31 min  Charges:    $Gait Training: 23-37 mins PT General Charges $$ ACUTE PT VISIT: 1 Visit                     [name], PT, DPT Acute Rehabilitation Services Secure Chat Preferred Office: [phone]    [name] 08/04/2023, 10:56 AM

## 2023-08-04 NOTE — Progress Notes (Signed)
 Patient ID: [name], male   DOB: 1936/07/26, 88 y.o.   MRN: [id number]      Subjective: L hip pain Working on IS ROS negative except as listed above. Objective: Vital signs in last 24 hours: Temp:  [98 F (36.7 C)-99 F (37.2 C)] 98.1 F (36.7 C) (01/02 0750) Pulse Rate:  [68-78] 70 (01/02 0750) Resp:  [18-19] 19 (01/02 0459) BP: (106-132)/(55-66) 108/55 (01/02 0750) SpO2:  [91 %-98 %] 91 % (01/02 0750) Last BM Date : 08/01/23  Intake/Output from previous day: 01/01 0701 - 01/02 0700 In: -  Out: 500 [Urine:500] Intake/Output this shift: No intake/output data recorded.  General appearance: alert and cooperative Resp: clear to auscultation bilaterally and did 2000 on IS GI: soft, NT  Lab Results: CBC  Recent Labs    08/02/23 0540 08/03/23 0759  WBC 10.5 8.6  HGB 12.5* 13.0  HCT 38.9* 39.9  PLT 117* 120*   BMET Recent Labs    08/02/23 0540 08/03/23 0759  NA 139 138  K 4.1 4.3  CL 108 106  CO2 23 25  GLUCOSE 112* 93  BUN 21 23  CREATININE 1.81* 1.90*  CALCIUM  8.8* 8.9   PT/INR Recent Labs    08/01/23 2127  LABPROT 14.9  INR 1.2   ABG No results for input(s): PHART, HCO3 in the last 72 hours.  Invalid input(s): PCO2, PO2  Studies/Results: CT HIP LEFT WO CONTRAST Result Date: 08/02/2023 CLINICAL DATA:  Fall from ladder.  Pain. EXAM: CT OF THE LEFT HIP WITHOUT CONTRAST TECHNIQUE: Multidetector CT imaging of the left hip was performed according to the standard protocol. Multiplanar CT image reconstructions were also generated. RADIATION DOSE REDUCTION: This exam was performed according to the departmental dose-optimization program which includes automated exposure control, adjustment of the mA and/or kV according to patient size and/or use of iterative reconstruction technique. COMPARISON:  CT chest, abdomen, and pelvis dated 08/01/2023. CT abdomen/pelvis dated 10/27/2021. FINDINGS: Bones/Joint/Cartilage Subtle cortical irregularity at the  medial and lateral cortices of the left inferior pubic ramus (series 3, images 103-108). Remainder of the visualized bones appear intact. The left femoral head is seated within the acetabulum. Left greater trochanteric enthesopathy. The pubic symphysis is anatomically aligned. Mild joint space narrowing and marginal osteophytosis of the left hip. Ligaments Ligaments are suboptimally evaluated by CT. Muscles and Tendons Muscles are normal for age.  No intramuscular collection identified. Soft tissue No fluid collection or hematoma. The visualized intrapelvic contents are grossly unchanged. Excreted vicarious contrast is noted within bladder. IMPRESSION: Subtle cortical irregularity of the left inferior pubic ramus is concerning for a nondisplaced fracture. No additional fracture identified. Electronically Signed   By: Harrietta Sherry M.D.   On: 08/02/2023 14:24   DG FEMUR MIN 2 VIEWS LEFT Result Date: 08/02/2023 CLINICAL DATA:  [id number] Fall from ladder [phone]. EXAM: LEFT FEMUR 2 VIEWS; LEFT KNEE - COMPLETE 4+ VIEW; LEFT TIBIA AND FIBULA - 2 VIEW COMPARISON:  None Available. FINDINGS: Bone mineralization within normal limits for patient's age. No acute fracture or dislocation. No aggressive osseous lesion. There are mild degenerative changes of the hip joint without significant joint space narrowing. Osteophytosis of the superior acetabulum. Patient is status post left total knee arthroplasty with patellar resurfacing. No periprosthetic fracture or lucency. The hardware is intact. There is near anatomic alignment. The ankle mortise is intact. Mild degenerative changes of ankle joint. No focal soft tissue swelling. No radiopaque foreign bodies. IMPRESSION: *No acute osseous abnormality of the left femur,  knee or leg. Electronically Signed   By: Ree Molt M.D.   On: 08/02/2023 10:43   DG Knee Complete 4 Views Left Result Date: 08/02/2023 CLINICAL DATA:  [id number] Fall from ladder [id number]. EXAM: LEFT FEMUR 2  VIEWS; LEFT KNEE - COMPLETE 4+ VIEW; LEFT TIBIA AND FIBULA - 2 VIEW COMPARISON:  None Available. FINDINGS: Bone mineralization within normal limits for patient's age. No acute fracture or dislocation. No aggressive osseous lesion. There are mild degenerative changes of the hip joint without significant joint space narrowing. Osteophytosis of the superior acetabulum. Patient is status post left total knee arthroplasty with patellar resurfacing. No periprosthetic fracture or lucency. The hardware is intact. There is near anatomic alignment. The ankle mortise is intact. Mild degenerative changes of ankle joint. No focal soft tissue swelling. No radiopaque foreign bodies. IMPRESSION: *No acute osseous abnormality of the left femur, knee or leg. Electronically Signed   By: Ree Molt M.D.   On: 08/02/2023 10:43   DG Tibia/Fibula Left Result Date: 08/02/2023 CLINICAL DATA:  [id number] Fall from ladder [id number]. EXAM: LEFT FEMUR 2 VIEWS; LEFT KNEE - COMPLETE 4+ VIEW; LEFT TIBIA AND FIBULA - 2 VIEW COMPARISON:  None Available. FINDINGS: Bone mineralization within normal limits for patient's age. No acute fracture or dislocation. No aggressive osseous lesion. There are mild degenerative changes of the hip joint without significant joint space narrowing. Osteophytosis of the superior acetabulum. Patient is status post left total knee arthroplasty with patellar resurfacing. No periprosthetic fracture or lucency. The hardware is intact. There is near anatomic alignment. The ankle mortise is intact. Mild degenerative changes of ankle joint. No focal soft tissue swelling. No radiopaque foreign bodies. IMPRESSION: *No acute osseous abnormality of the left femur, knee or leg. Electronically Signed   By: Ree Molt M.D.   On: 08/02/2023 10:43    Anti-infectives: Anti-infectives (From admission, onward)    None       Assessment/Plan: Fall from ladder Right rib fx- pulm toilet, pain control, doing great with  IS Left hip/le pain- films and ct negative some question of rami fx, nothing more than pain control needed Panc tail lesion- outpatient follow up Dvt prophylaxis - is back on Eliquis  Therapies rec SNF   LOS: 2 days    Dann Hummer, MD, MPH, FACS Trauma & General Surgery Use [email or web address] to contact on call provider  08/04/2023

## 2023-08-04 NOTE — Progress Notes (Signed)
 PROGRESS NOTE    [name]  FMW:[id number] DOB: 02-20-1936 DOA: 08/01/2023 PCP: Arloa Elsie SAUNDERS, MD    Brief Narrative:   [name] is a 88 y.o. male with past medical history significant for paroxysmal atrial fibrillation on Eliquis , chronic diastolic congestive heart failure, HTN, severe mitral gravitation s/p mitral valve clip (August 2023), CKD stage IIIb (baseline creatinine 1.6-2.0) who presented to [facility] ED on 12/30 following mechanical fall with complaint of right-sided rib, left hip and right shoulder pain.  Patient reports he was utilizing a stepstool to get in and out of the bed of his truck when he slipped and fell onto his right side.  Patient unsure if he struck his head but denies any loss of consciousness.  Denies headache or acute neck pain.  Reports that he is compliant with his Eliquis .  Further denies palpitations, no chest pain, no diaphoresis, no nausea/vomiting, no diet dizziness, no visual changes, no fever/chills, no myalgias, no urinary symptoms.  In the ED, temperature 97.7 F, HR 66, RR 18, BP 147/86, SpO2 96% on room air.  WBC 13.9, hemoglobin 13.8, platelet count 152.  Sodium 142, potassium 4.3, chloride 106, CO2 24, glucose 120, BUN 25, creatinine 0.99.  CT head/C-spine without contrast with no acute intracranial abnormality, no acute displaced fracture or traumatic listhesis of the C-spine.  CT chest/abdomen/pelvis with acute nondisplaced right 4-7 rib fractures, no acute intrathoracic, intra-abdominal, intrapelvic traumatic injury, interval increase size of 2.4 x 2.5 cm pancreatic tail fluid density lesion, cardiomegaly, cholelithiasis without cholecystitis, prostamegaly, aortic atherosclerosis, small right inguinal hernia containing fat and mesentery.  Right shoulder negative for acute traumatic injury.  Pelvis x-ray negative for acute traumatic injury.  Chest x-ray with no active cardiopulmonary disease process.  Trauma surgery consulted.  Patient was given  fentanyl  50 mcg IV x 1, Dilaudid  0.5 mg IV x 1, NS 500 mL bolus.  TRH consulted for admission for further evaluation and management/pain control following fall resulting in right-sided rib fractures.  Assessment & Plan:   Acute nondisplaced 4th through 7th rib fractures on the right Mechanical fall Patient presenting to the ED with right-sided rib pain, right shoulder pain, left hip pain following mechanical fall after he slipped off a stepstool.  Imaging unremarkable except for CT chest/abdomen/pelvis notable for acute nondisplaced right 4-7 rib fractures without any further intra-thoracic/abdominal/intrapelvic traumatic injury.  X-ray left femur, knee, tibia/tibia but no acute osseous abnormality. -- Trauma surgery following, appreciate assistance -- Oxycodone  5 mg p.o. every 4 hours as needed moderate pain -- Dilaudid  0.5 mg IV every 2 hours as needed severe pain -- Incentive spirometry -- PT/OT recommending SNF placement, TOC consulted; awaiting insurance authorization  Left inferior pubic ramus fracture, nondisplaced CT left hip with subtle cortical irregularity of the left inferior pubic ramus concerning for a nondisplaced fracture, no further fracture identified. -- Weightbearing as tolerated -- PT/OT as above  Pancreatic tail lesion CT abdomen/pelvis notable for increase in size of a pancreatic tail fluid density measuring now 2.4 x 2.5 cm. -- Outpatient follow-up with GI/general surgery  Paroxysmal atrial fibrillation Chronic diastolic congestive heart failure Essential hypertension -- Amlodipine  2.5 mg p.o. daily -- Amiodarone  200 mg p.o. daily -- Aspirin  81 mg p.o. daily -- Furosemide  20 mg every other day -- Eliquis  2.5 mg p.o. twice daily -- Monitor on telemetry -- Strict I's and O's and daily weights  Hyperlipidemia -- Atorvastatin  80 mg p.o. daily -- Zetia  10 mg p.o. daily  Hypothyroidism -- Levothyroxine  50  mcg p.o. daily  CKD stage IIIb Baseline creatinine  1.6-2.0.,  Creatinine on admission 2.10 at baseline. -- Cr 2.10>1.80 -- Avoid nephrotoxins, renal dose all medications  GERD -- Protonix  40 mg p.o. daily   DVT prophylaxis: apixaban  (ELIQUIS ) tablet 2.5 mg Start: 08/03/23 1115 SCDs Start: 08/02/23 0227 apixaban  (ELIQUIS ) tablet 2.5 mg    Code Status: Full Code Family Communication: No family present bedside this morning  Disposition Plan:  Level of care: Med-Surg Status is: Inpatient Remains inpatient appropriate because: Pending insurance authorization for SNF placement, medically ready for discharge once bed available    Consultants:  Trauma surgery, Dr. Ebbie.  Procedures:  None  Antimicrobials:  None   Subjective: Patient seen examined bedside, lying in bed, right sided rib pain improved.  Continues with difficulty ambulating due to left hip pain.  Pain controlled with oral medications.  Awaiting SNF placement and insurance authorization.  Social work reports website down and unable to [employer] authorization at this time.   No other specific questions, concerns or complaints at this time. Denies headache, no visual changes, no chest pain, no palpitations, no shortness of breath, no abdominal pain, no fever/chills/night sweats, no nausea/vomiting/diarrhea, no focal weakness, no fatigue, no paresthesia.  No acute events overnight per nursing staff.   Objective: Vitals:   08/03/23 1357 08/03/23 2039 08/04/23 0459 08/04/23 0750  BP: 127/66 (!) 132/57 120/66 (!) 108/55  Pulse: 73 72 68 70  Resp: 18 18 19    Temp: 98 F (36.7 C) 99 F (37.2 C) 98.2 F (36.8 C) 98.1 F (36.7 C)  TempSrc: Oral Oral Oral Oral  SpO2: 92% 94% 98% 91%  Weight:      Height:        Intake/Output Summary (Last 24 hours) at 08/04/2023 1409 Last data filed at 08/03/2023 1800 Gross per 24 hour  Intake --  Output 500 ml  Net -500 ml   Filed Weights   08/01/23 2119  Weight: 76.7 kg    Examination:  Physical Exam: GEN: NAD,  alert and oriented x 3, wd/wn HEENT: NCAT, PERRL, EOMI, sclera clear, MMM PULM: CTAB w/o wheezes/crackles, normal respiratory effort, on room air CV: RRR w/o M/G/R GI: abd soft, NTND, NABS, no R/G/M MSK: no peripheral edema, decreased active/passive range of motion left hip due to pain, left knee with old surgical incision noted, also left knee with decreased range of motion due to pain from left hip, TTP right flank area surrounding rib fractures NEURO: CN II-XII intact, no focal deficits, sensation to light touch intact PSYCH: normal mood/affect Integumentary: dry/intact, no rashes or wounds    Data Reviewed: I have personally reviewed following labs and imaging studies  CBC: Recent Labs  Lab 08/01/23 2127 08/01/23 2144 08/02/23 0540 08/03/23 0759  WBC 13.9*  --  10.5 8.6  NEUTROABS 11.7*  --  8.5*  --   HGB 13.8 14.6 12.5* 13.0  HCT 43.2 43.0 38.9* 39.9  MCV 97.5  --  97.0 95.0  PLT 152  --  117* 120*   Basic Metabolic Panel: Recent Labs  Lab 08/01/23 2127 08/01/23 2144 08/02/23 0540 08/03/23 0759  NA 142 141 139 138  K 4.3 4.3 4.1 4.3  CL 106 106 108 106  CO2 24  --  23 25  GLUCOSE 128* 124* 112* 93  BUN 25* 26* 21 23  CREATININE 1.99* 2.10* 1.81* 1.90*  CALCIUM  9.4  --  8.8* 8.9  MG  --   --  2.0  --  PHOS  --   --  3.8  --    GFR: Estimated Creatinine Clearance: 28.4 mL/min (A) (by C-G formula based on SCr of 1.9 mg/dL (H)). Liver Function Tests: Recent Labs  Lab 08/01/23 2127 08/02/23 0540  AST 21 19  ALT 20 19  ALKPHOS 54 50  BILITOT 0.9 0.8  PROT 6.9 5.7*  ALBUMIN  3.8 3.1*   No results for input(s): LIPASE, AMYLASE in the last 168 hours. No results for input(s): AMMONIA in the last 168 hours. Coagulation Profile: Recent Labs  Lab 08/01/23 2127  INR 1.2   Cardiac Enzymes: No results for input(s): CKTOTAL, CKMB, CKMBINDEX, TROPONINI in the last 168 hours. BNP (last 3 results) No results for input(s): PROBNP in the last  8760 hours. HbA1C: No results for input(s): HGBA1C in the last 72 hours. CBG: Recent Labs  Lab 08/01/23 2119  GLUCAP 122*   Lipid Profile: No results for input(s): CHOL, HDL, LDLCALC, TRIG, CHOLHDL, LDLDIRECT in the last 72 hours. Thyroid  Function Tests: No results for input(s): TSH, T4TOTAL, FREET4, T3FREE, THYROIDAB in the last 72 hours. Anemia Panel: No results for input(s): VITAMINB12, FOLATE, FERRITIN, TIBC, IRON, RETICCTPCT in the last 72 hours. Sepsis Labs: No results for input(s): PROCALCITON, LATICACIDVEN in the last 168 hours.  No results found for this or any previous visit (from the past 240 hours).       Radiology Studies: No results found.       Scheduled Meds:  amiodarone   200 mg Oral Daily   amLODipine   2.5 mg Oral Daily   apixaban   2.5 mg Oral BID   aspirin  EC  81 mg Oral Daily   atorvastatin   80 mg Oral QHS   ezetimibe   10 mg Oral Daily   finasteride   5 mg Oral Daily   furosemide   20 mg Oral QODAY   levothyroxine   50 mcg Oral q morning   pantoprazole   40 mg Oral Daily   senna-docusate  2 tablet Oral BID   Continuous Infusions:   LOS: 2 days    Time spent: 48 minutes spent on chart review, discussion with nursing staff, consultants, updating family and interview/physical exam; more than 50% of that time was spent in counseling and/or coordination of care.    Camellia PARAS Gene Colee, DO [facility] Available via Epic secure chat 7am-7pm After these hours, please refer to coverage provider listed on [email or web address] 08/04/2023, 2:09 PM

## 2023-08-05 DIAGNOSIS — S2241XA Multiple fractures of ribs, right side, initial encounter for closed fracture: Secondary | ICD-10-CM | POA: Diagnosis not present

## 2023-08-05 MED ORDER — BISACODYL 10 MG RE SUPP
10.0000 mg | Freq: Once | RECTAL | Status: AC
  Administered 2023-08-05: 10 mg via RECTAL
  Filled 2023-08-05: qty 1

## 2023-08-05 NOTE — Hospital Course (Addendum)
 88 y.o. M with PAF on Eliquis , D CHF, HTN, severe mitral regurgitation status post clip 2023, CKD stage IIIb baseline 1.6-2 who presented with accidental fall, hip shoulder pain.   In the ER found to have left inferior pubic ramus fracture, nondisplaced 4th through 7th rib fractures on the right.         Assessment and Plan: Multitrauma Multiple rib fractures, right 4th through 7th rib fractures Left inferior pubic ramus fracture Mechanical fall - Pain control - IS - PT OT     Paroxysmal atrial fibrillation Chronic diastolic congestive heart failure Essential hypertension Hyperlipidemia - Continue aspirin , amlodipine , Zetia  - Continue furosemide  - Continue apixaban  and amiodarone    Chronic kidney disease stage IIIb Creatinine stable relative to baseline   Hyperlipidemia - Continue Lipitor  and Zetia    Hypothyroidism - Continue levothyroxine    Pancreatic tail lesion Incidental finding - Outpatient follow-up with GI or general surgery               Subjective: No new complaints, no nursing concerns.         Physical Exam:  Elderly adult male, sitting up in recliner, interactive and appropriate RRR, no murmurs, no peripheral edema Respiratory rate normal, lung sounds diminished, poor lung excursion Attention normal, affect appropriate, judgment and insight appear normal

## 2023-08-05 NOTE — TOC Progression Note (Signed)
 Transition of Care [facility]) - Progression Note    Patient Details  Name: [name] MRN: [id number] Date of Birth: [birth year]  [facility] of [facility]) CM/SW Contact  Bridget Cordella Simmonds, LCSW Phone Number: 08/05/2023, 12:02 PM  Clinical Narrative:   CSW spoke with [facility].  They can receive pt over the weekend once auth approved.  Randine is contact.      Expected Discharge Plan: Skilled Nursing Facility Barriers to Discharge: Continued Medical Work up, SNF Pending bed offer  Expected Discharge Plan and Services In-house Referral: Clinical Social Work   Post Acute Care Choice:  (TBD: SNF vs HH) Living arrangements for the past 2 months: Single Family Home                                       Social Determinants of Health (SDOH) Interventions SDOH Screenings   Food Insecurity: No Food Insecurity (08/02/2023)  Housing: Unknown (08/02/2023)  Transportation Needs: No Transportation Needs (08/02/2023)  Utilities: Not At Risk (08/02/2023)  Tobacco Use: Medium Risk (08/02/2023)    Readmission Risk Interventions     No data to display

## 2023-08-05 NOTE — Progress Notes (Addendum)
 Physical Therapy Treatment  Patient Details Name: [name] MRN: [id number] DOB: Jan 22, 1936 Today's Date: 08/05/2023   History of Present Illness Pt is an 88 y/o male who presents 08/01/23 s/p mechanical fall. He sustained a L pubic ramus fx and R rib fractures 4-7. PMH significant for paroxysmal a-fib chronically anticoagulated, chronic dHF, essential HTN, severe mitral regurgitation s/p mitral valve clip in August 2023, CKD.    PT Comments  Pt progressing towards physical therapy goals. Was able to perform transfers and ambulation with gross CGA and RW for support. Close monitoring of BP this session due to orthostasic hypotension yesterday however improved this session with milder drops in BP and milder symptoms. Pt able to progress ambulation distance.   Orthostatic BPs  Supine 133/60  Sitting 124/80  Standing 107/56  Symptomatic  Sitting after 1st walk 127/65 Mild symptoms  Sitting after 2nd walk 131/67     Of note, pt reports baseline R knee deficits and was planning to have surgery. However, after pt had L knee surgery symptoms improved on the R. He states that his R knee is now becoming more painful with the increased load required for mobility 2 L side pain and weakness. Trialed ACE wrap on R knee for mobility and pt reports improvement in symptoms. ACE removed at end of session. Recommend continuing with ACE prior to mobility with therapies for increased support to knee joint.   If plan is discharge home, recommend the following: A lot of help with walking and/or transfers;A lot of help with bathing/dressing/bathroom;Assistance with cooking/housework;Assist for transportation;Help with stairs or ramp for entrance   Can travel by private vehicle     No  Equipment Recommendations  Rolling walker (2 wheels);BSC/3in1    Recommendations for Other Services       Precautions / Restrictions Precautions Precautions: Fall Precaution Comments: R rib fx's, orthostatic, wrap R  knee with ACE wrap for pain control - remove after PT Restrictions Weight Bearing Restrictions Per Provider Order: Yes LLE Weight Bearing Per Provider Order: Weight bearing as tolerated     Mobility  Bed Mobility Overal bed mobility: Needs Assistance Bed Mobility: Supine to Sit     Supine to sit: Supervision     General bed mobility comments: HOB elevated and increased time. Pt was able to transition to EOB without assist.    Transfers Overall transfer level: Needs assistance Equipment used: Rolling walker (2 wheels) Transfers: Sit to/from Stand Sit to Stand: Contact guard assist, From elevated surface Stand pivot transfers: Contact guard assist         General transfer comment: VC's for hand placement on seated surface for safety. No assist required but hands on guarding provided for safety.    Ambulation/Gait Ambulation/Gait assistance: Contact guard assist Gait Distance (Feet): 30 Feet (10' + seated rest + 30') Assistive device: Rolling walker (2 wheels) Gait Pattern/deviations: Step-to pattern, Decreased stride length, Trunk flexed, Narrow base of support, Step-through pattern Gait velocity: Decreased Gait velocity interpretation: <1.31 ft/sec, indicative of household ambulator   General Gait Details: Slow but generally steady with RW for support. Initiating a step-through pattern today. Pt reports no R knee pain with weight bearing.   Stairs             Wheelchair Mobility     Tilt Bed    Modified Rankin (Stroke Patients Only)       Balance Overall balance assessment: Needs assistance, History of Falls Sitting-balance support: Single extremity supported, Feet supported Sitting balance-Leahy Scale:  Fair     Standing balance support: Bilateral upper extremity supported, During functional activity, Reliant on assistive device for balance Standing balance-Leahy Scale: Poor Standing balance comment: Able to maintain static standing balance without  support. Unable to maintain dynamic standing balance unsupported and relied heavily on RW.                            Cognition Arousal: Alert Behavior During Therapy: WFL for tasks assessed/performed Overall Cognitive Status: Within Functional Limits for tasks assessed                                          Exercises General Exercises - Lower Extremity Long Arc Quad: 5 reps, Both    General Comments General comments (skin integrity, edema, etc.): Pt continues to be mildly orthostatic and symptomatic but overall tolerated OOB mobility better this date.      Pertinent Vitals/Pain Pain Assessment Pain Assessment: Faces Faces Pain Scale: Hurts little more Pain Location: Right ribs and Left hip Pain Descriptors / Indicators: Sore, Stabbing, Grimacing, Guarding Pain Intervention(s): Limited activity within patient's tolerance, Monitored during session, Repositioned    Home Living                          Prior Function            PT Goals (current goals can now be found in the care plan section) Acute Rehab PT Goals Patient Stated Goal: Be able to return home with wife PT Goal Formulation: With patient Time For Goal Achievement: 08/17/23 Potential to Achieve Goals: Good Progress towards PT goals: Progressing toward goals    Frequency    Min 1X/week      PT Plan      Co-evaluation              AM-PAC PT 6 Clicks Mobility   Outcome Measure  Help needed turning from your back to your side while in a flat bed without using bedrails?: A Little Help needed moving from lying on your back to sitting on the side of a flat bed without using bedrails?: A Little Help needed moving to and from a bed to a chair (including a wheelchair)?: A Little Help needed standing up from a chair using your arms (e.g., wheelchair or bedside chair)?: A Little Help needed to walk in hospital room?: A Little Help needed climbing 3-5 steps with  a railing? : A Lot 6 Click Score: 17    End of Session Equipment Utilized During Treatment: Gait belt Activity Tolerance: Patient tolerated treatment well Patient left: with call bell/phone within reach;in chair;with chair alarm set Nurse Communication: Mobility status PT Visit Diagnosis: Unsteadiness on feet (R26.81);Pain Pain - Right/Left: Left Pain - part of body: Hip     Time: 0922-0952 PT Time Calculation (min) (ACUTE ONLY): 30 min  Charges:    $Gait Training: 23-37 mins PT General Charges $$ ACUTE PT VISIT: 1 Visit                     [name], PT, DPT Acute Rehabilitation Services Secure Chat Preferred Office: [phone]    [name] 08/05/2023, 10:10 AM

## 2023-08-05 NOTE — Plan of Care (Signed)

## 2023-08-05 NOTE — Progress Notes (Signed)
  Progress Note   Patient: [name] FMW:[id number] DOB: [birth year] DOA: 08/01/2023     3 DOS: the patient was seen and examined on 08/05/2023        Brief hospital course: 88 y.o. M with PAF on Eliquis , D CHF, HTN, severe mitral regurgitation status post clip 2023, CKD stage IIIb baseline 1.6-2 who presented with accidental fall, hip shoulder pain.  In the ER found to have left inferior pubic ramus fracture, nondisplaced 4th through 7th rib fractures on the right.     Assessment and Plan: Multitrauma Multiple rib fractures, right 4th through 7th rib fractures Left inferior pubic ramus fracture Mechanical fall -Pain control - PT OT   Paroxysmal atrial fibrillation Chronic diastolic congestive heart failure Essential hypertension Hyperlipidemia -Continue aspirin , amlodipine , Zetia  - Continue furosemide  - Continue apixaban  and amiodarone   Chronic kidney disease stage IIIb Creatinine stable relative to baseline  Hyperlipidemia -Continue Lipitor  and Zetia   Hypothyroidism -Continue levothyroxine   Pancreatic tail lesion Incidental finding - Outpatient follow-up with GI or general surgery        Subjective: Patient is constipated.  He still has right shoulder pain, pelvic pain with ambulating.  No confusion, fever, urinary symptoms, respiratory symptoms.     Physical Exam: BP (!) 145/63 (BP Location: Right Arm)   Pulse 78   Temp 97.8 F (36.6 C)   Resp 17   Ht 5' 9 (1.753 m)   Wt 80.4 kg   SpO2 97%   BMI 26.18 kg/m   Elderly adult male, sitting up in recliner, interactive and appropriate RRR, no murmurs, no peripheral edema Respiratory rate normal, lung sounds diminished, poor lung excursion Attention normal, affect appropriate, judgment and insight appear normal    Data Reviewed: None new  Family Communication: Wife at the bedside    Disposition: Status is: Inpatient         Author: Lonni SHAUNNA Dalton, MD 08/05/2023 5:01  PM  For on call review [email or web address].

## 2023-08-06 DIAGNOSIS — S2241XA Multiple fractures of ribs, right side, initial encounter for closed fracture: Secondary | ICD-10-CM | POA: Diagnosis not present

## 2023-08-06 DIAGNOSIS — N400 Enlarged prostate without lower urinary tract symptoms: Secondary | ICD-10-CM | POA: Diagnosis not present

## 2023-08-06 DIAGNOSIS — E039 Hypothyroidism, unspecified: Secondary | ICD-10-CM | POA: Diagnosis not present

## 2023-08-06 DIAGNOSIS — I5032 Chronic diastolic (congestive) heart failure: Secondary | ICD-10-CM | POA: Diagnosis not present

## 2023-08-06 MED ORDER — SENNOSIDES-DOCUSATE SODIUM 8.6-50 MG PO TABS
1.0000 | ORAL_TABLET | Freq: Every day | ORAL | Status: DC
  Administered 2023-08-07 – 2023-08-08 (×2): 1 via ORAL
  Filled 2023-08-06 (×2): qty 1

## 2023-08-06 MED ORDER — POLYETHYLENE GLYCOL 3350 17 G PO PACK
17.0000 g | PACK | Freq: Two times a day (BID) | ORAL | Status: DC
  Administered 2023-08-06 – 2023-08-08 (×5): 17 g via ORAL
  Filled 2023-08-06 (×5): qty 1

## 2023-08-06 NOTE — Progress Notes (Signed)
 Mobility Specialist Progress Note:    08/06/23 1419  Therapy Vitals  Temp (!) 97.5 F (36.4 C)  Temp Source Oral  Pulse Rate 65  Resp 16  BP 128/67  Patient Position (if appropriate) Lying  Oxygen Therapy  SpO2 96 %  O2 Device Room Air  Mobility  Activity Ambulated with assistance in room;Transferred from bed to chair  Level of Assistance Contact guard assist, steadying assist  Assistive Device Front wheel walker  Distance Ambulated (ft) 40 ft  LLE Weight Bearing Per Provider Order WBAT  Activity Response Tolerated well  Mobility Referral Yes  Mobility visit 1 Mobility  Mobility Specialist Start Time (ACUTE ONLY) 1356  Mobility Specialist Stop Time (ACUTE ONLY) 1405  Mobility Specialist Time Calculation (min) (ACUTE ONLY) 9 min   Pt received in bed, agreeable to mobility. Able to stand and ambulate in room w/ CG. No complaints throughout. Noted R foot drag. Pt left in chair with call bell and all needs met. RN aware.  D'Vante Nicholaus Mobility Specialist Please contact via Special Educational Needs Teacher or Rehab office at [phone]

## 2023-08-06 NOTE — Progress Notes (Signed)
  Progress Note   Patient: [name] FMW:[id number] DOB: 03-10-1936 DOA: 08/01/2023     4 DOS: the patient was seen and examined on 08/06/2023 at 10:15AM      Brief hospital course: 88 y.o. M with PAF on Eliquis , D CHF, HTN, severe mitral regurgitation status post clip 2023, CKD stage IIIb baseline 1.6-2 who presented with accidental fall, hip shoulder pain.   In the ER found to have left inferior pubic ramus fracture, nondisplaced 4th through 7th rib fractures on the right.         Assessment and Plan: Multitrauma Multiple rib fractures, right 4th through 7th rib fractures Left inferior pubic ramus fracture Mechanical fall - Pain control - IS - PT OT     Paroxysmal atrial fibrillation Chronic diastolic congestive heart failure Essential hypertension Hyperlipidemia - Continue aspirin , amlodipine , Zetia  - Continue furosemide  - Continue apixaban  and amiodarone    Chronic kidney disease stage IIIb Creatinine stable relative to baseline   Hyperlipidemia - Continue Lipitor  and Zetia    Hypothyroidism - Continue levothyroxine    Pancreatic tail lesion Incidental finding - Outpatient follow-up with GI or general surgery               Subjective: No new complaints, no nursing concerns. Constipation relieved.         Physical Exam: BP 128/67 (BP Location: Right Arm)   Pulse 65   Temp (!) 97.5 F (36.4 C) (Oral)   Resp 16   Ht 5' 9 (1.753 m)   Wt 80.4 kg   SpO2 96%   BMI 26.18 kg/m   Elderly adult male, sitting up in recliner, interactive and appropriate RRR, no murmurs, no peripheral edema Respiratory rate normal, lung sounds diminished, poor lung excursion Attention normal, affect appropriate, judgment and insight appear normal        Data Reviewed: None new  Family Communication: None present    Disposition: Status is: Inpatient         Author: Lonni SHAUNNA Dalton, MD 08/06/2023 2:22 PM  For on call review  [email or web address].

## 2023-08-07 ENCOUNTER — Other Ambulatory Visit: Payer: Self-pay

## 2023-08-07 DIAGNOSIS — S2241XA Multiple fractures of ribs, right side, initial encounter for closed fracture: Secondary | ICD-10-CM | POA: Diagnosis not present

## 2023-08-07 DIAGNOSIS — N400 Enlarged prostate without lower urinary tract symptoms: Secondary | ICD-10-CM | POA: Diagnosis not present

## 2023-08-07 DIAGNOSIS — I5032 Chronic diastolic (congestive) heart failure: Secondary | ICD-10-CM | POA: Diagnosis not present

## 2023-08-07 DIAGNOSIS — E039 Hypothyroidism, unspecified: Secondary | ICD-10-CM | POA: Diagnosis not present

## 2023-08-07 NOTE — Progress Notes (Signed)
  Progress Note   Patient: [name] FMW:[id number] DOB: 16-Jul-1936 DOA: 08/01/2023     5 DOS: the patient was seen and examined on 08/07/2023        Brief hospital course: 88 y.o. M with PAF on Eliquis , D CHF, HTN, severe mitral regurgitation status post clip 2023, CKD stage IIIb baseline 1.6-2 who presented with accidental fall, hip shoulder pain.   In the ER found to have left inferior pubic ramus fracture, nondisplaced 4th through 7th rib fractures on the right.         Assessment and Plan: Multitrauma Multiple rib fractures, right 4th through 7th rib fractures Left inferior pubic ramus fracture Mechanical fall - Pain control - IS - PT OT     Paroxysmal atrial fibrillation Chronic diastolic congestive heart failure Essential hypertension Hyperlipidemia - Continue aspirin , amlodipine , Zetia  - Continue furosemide  - Continue apixaban  and amiodarone    Chronic kidney disease stage IIIb Creatinine stable relative to baseline   Hyperlipidemia - Continue Lipitor  and Zetia    Hypothyroidism - Continue levothyroxine    Pancreatic tail lesion Incidental finding - Outpatient follow-up with GI or general surgery               Subjective: No new complaints, no nursing concerns.         Physical Exam: BP 119/68 (BP Location: Right Arm)   Pulse 73   Temp 97.9 F (36.6 C) (Oral)   Resp 18   Ht 5' 9 (1.753 m)   Wt 80.4 kg   SpO2 96%   BMI 26.18 kg/m   Elderly adult male, sitting up in recliner, interactive and appropriate RRR, no murmurs, no peripheral edema Respiratory rate normal, lung sounds diminished, poor lung excursion Attention normal, affect appropriate, judgment and insight appear normal           Author: Lonni SHAUNNA Dalton, MD 08/07/2023 1:18 PM  For on call review [email or web address].

## 2023-08-07 NOTE — Plan of Care (Signed)

## 2023-08-08 DIAGNOSIS — I5032 Chronic diastolic (congestive) heart failure: Secondary | ICD-10-CM | POA: Diagnosis not present

## 2023-08-08 DIAGNOSIS — N400 Enlarged prostate without lower urinary tract symptoms: Secondary | ICD-10-CM | POA: Diagnosis not present

## 2023-08-08 DIAGNOSIS — E039 Hypothyroidism, unspecified: Secondary | ICD-10-CM | POA: Diagnosis not present

## 2023-08-08 DIAGNOSIS — S2241XA Multiple fractures of ribs, right side, initial encounter for closed fracture: Secondary | ICD-10-CM | POA: Diagnosis not present

## 2023-08-08 MED ORDER — BISACODYL 10 MG RE SUPP: 10.0000 mg | Freq: Every day | RECTAL | Status: DC | PRN

## 2023-08-08 NOTE — Care Management Important Message (Signed)
 Important Message  Patient Details  Name: [name] MRN: [id number] Date of Birth: 09-19-1935   Important Message Given:  Yes - Medicare IM     Dorena Bodo 08/08/2023, 3:40 PM

## 2023-08-08 NOTE — Progress Notes (Signed)
 Physical Therapy Treatment  Patient Details Name: [name] MRN: [id number] DOB: 1936-06-07 Today's Date: 08/08/2023   History of Present Illness Pt is an 88 y/o male who presents 08/01/23 s/p mechanical fall. He sustained a L pubic ramus fx and R rib fractures 4-7. PMH significant for paroxysmal a-fib chronically anticoagulated, chronic dHF, essential HTN, severe mitral regurgitation s/p mitral valve clip in August 2023, CKD.    PT Comments  Pt progressing well towards physical therapy goals. He is currently functioning at a supervision level with the RW for support, and is improving with ambulation distance each session. Pt reports no orthostatic symptoms during OOB mobility and BP not checked. Will continue to follow and progress as able per POC.     If plan is discharge home, recommend the following: A lot of help with walking and/or transfers;A lot of help with bathing/dressing/bathroom;Assistance with cooking/housework;Assist for transportation;Help with stairs or ramp for entrance   Can travel by private vehicle     No  Equipment Recommendations  Rolling walker (2 wheels);BSC/3in1    Recommendations for Other Services       Precautions / Restrictions Precautions Precautions: Fall Precaution Comments: R rib fx's, orthostatic, wrap R knee with ACE wrap for pain control - remove after PT Restrictions Weight Bearing Restrictions Per Provider Order: Yes LLE Weight Bearing Per Provider Order: Weight bearing as tolerated     Mobility  Bed Mobility               General bed mobility comments: Pt was received sitting up in the recliner.    Transfers Overall transfer level: Needs assistance Equipment used: Rolling walker (2 wheels) Transfers: Sit to/from Stand Sit to Stand: Supervision, From elevated surface           General transfer comment: Pt demonstrated proper hand placement on seated surface for safety. No assist required.     Ambulation/Gait Ambulation/Gait assistance: Supervision Gait Distance (Feet): 110 Feet Assistive device: Rolling walker (2 wheels) Gait Pattern/deviations: Decreased stride length, Trunk flexed, Narrow base of support, Step-through pattern Gait velocity: Decreased Gait velocity interpretation: <1.31 ft/sec, indicative of household ambulator   General Gait Details: Slow but generally steady with RW for support. R knee ACE wrapped for support and pt reports no pain with weight bearing.   Stairs             Wheelchair Mobility     Tilt Bed    Modified Rankin (Stroke Patients Only)       Balance Overall balance assessment: Needs assistance, History of Falls Sitting-balance support: Single extremity supported, Feet supported Sitting balance-Leahy Scale: Fair     Standing balance support: Bilateral upper extremity supported, During functional activity, Reliant on assistive device for balance Standing balance-Leahy Scale: Fair Standing balance comment: Statically                            Cognition Arousal: Alert Behavior During Therapy: WFL for tasks assessed/performed Overall Cognitive Status: Within Functional Limits for tasks assessed                                          Exercises      General Comments        Pertinent Vitals/Pain Pain Assessment Pain Assessment: Faces Faces Pain Scale: Hurts a little bit Pain Location: Right ribs and Left  hip Pain Descriptors / Indicators: Sore, Stabbing, Grimacing, Guarding Pain Intervention(s): Limited activity within patient's tolerance, Monitored during session, Repositioned    Home Living Family/patient expects to be discharged to:: Private residence Living Arrangements: Spouse/significant other (Pt is caregiver for wife.) Available Help at Discharge: Family;Available PRN/intermittently Type of Home: House Home Access: Stairs to enter Entrance Stairs-Rails: None Entrance  Stairs-Number of Steps: 2   Home Layout: One level Home Equipment: Shower seat      Prior Function            PT Goals (current goals can now be found in the care plan section) Acute Rehab PT Goals Patient Stated Goal: Be able to return home with wife PT Goal Formulation: With patient Time For Goal Achievement: 08/17/23 Potential to Achieve Goals: Good Progress towards PT goals: Progressing toward goals    Frequency    Min 1X/week      PT Plan      Co-evaluation              AM-PAC PT 6 Clicks Mobility   Outcome Measure  Help needed turning from your back to your side while in a flat bed without using bedrails?: A Little Help needed moving from lying on your back to sitting on the side of a flat bed without using bedrails?: A Little Help needed moving to and from a bed to a chair (including a wheelchair)?: A Little Help needed standing up from a chair using your arms (e.g., wheelchair or bedside chair)?: A Little Help needed to walk in hospital room?: A Little Help needed climbing 3-5 steps with a railing? : A Little 6 Click Score: 18    End of Session Equipment Utilized During Treatment: Gait belt Activity Tolerance: Patient tolerated treatment well Patient left: with call bell/phone within reach;in chair;with chair alarm set Nurse Communication: Mobility status PT Visit Diagnosis: Unsteadiness on feet (R26.81);Pain Pain - Right/Left: Left Pain - part of body: Hip     Time: 9057-8991 PT Time Calculation (min) (ACUTE ONLY): 26 min  Charges:    $Gait Training: 23-37 mins PT General Charges $$ ACUTE PT VISIT: 1 Visit                     Leita Sable, PT, DPT Acute Rehabilitation Services Secure Chat Preferred Office: [phone]    Leita JONETTA Sable 08/08/2023, 11:07 AM

## 2023-08-08 NOTE — TOC Progression Note (Addendum)
 Transition of Care [facility]) - Progression Note    Patient Details  Name: [name] MRN: [id number] Date of Birth: Jan 30, 1936  [facility] CM/SW Contact  Bridget Cordella Simmonds, LCSW Phone Number: 08/08/2023, 10:21 AM  Clinical Narrative:   Pt [facility] still pending for SNF approval.   1100: Message from Laura/PT: pt did well this AM, may be appropriate for DC home with [facility] if family is interested in this.  CSW spoke with daughter Tillman by phone, family would like to consider this option, they will be coming to the hospital shortly and will talk about it with pt.  1245: TC message from pt wife Jenkins.  THey would like to pursue DC home.   Expected Discharge Plan: Skilled Nursing Facility Barriers to Discharge: Continued Medical Work up, SNF Pending bed offer  Expected Discharge Plan and Services In-house Referral: Clinical Social Work   Post Acute Care Choice:  (TBD: SNF vs HH) Living arrangements for the past 2 months: Single Family Home                                       Social Determinants of Health (SDOH) Interventions SDOH Screenings   Food Insecurity: No Food Insecurity (08/02/2023)  Housing: Unknown (08/02/2023)  Transportation Needs: No Transportation Needs (08/02/2023)  Utilities: Not At Risk (08/02/2023)  Social Connections: Moderately Integrated (08/07/2023)  Tobacco Use: Medium Risk (08/02/2023)    Readmission Risk Interventions     No data to display

## 2023-08-08 NOTE — Progress Notes (Signed)
  Progress Note   Patient: [name] FMW:[id number] DOB: 04-30-1936 DOA: 08/01/2023     6 DOS: the patient was seen and examined on 08/08/2023 at 11:15AM      Brief hospital course: 88 y.o. M with PAF on Eliquis , D CHF, HTN, severe mitral regurgitation status post clip 2023, CKD stage IIIb baseline 1.6-2 who presented with accidental fall, hip shoulder pain.   In the ER found to have left inferior pubic ramus fracture, nondisplaced 4th through 7th rib fractures on the right.         Assessment and Plan: Multitrauma Multiple rib fractures, right 4th through 7th rib fractures Left inferior pubic ramus fracture Mechanical fall - Pain control - IS - PT OT     Paroxysmal atrial fibrillation Chronic diastolic congestive heart failure Essential hypertension Hyperlipidemia - Continue aspirin , amlodipine , Zetia  - Continue furosemide  - Continue apixaban  and amiodarone    Chronic kidney disease stage IIIb Creatinine stable relative to baseline   Hyperlipidemia - Continue Lipitor  and Zetia    Hypothyroidism - Continue levothyroxine    Pancreatic tail lesion Incidental finding - Outpatient follow-up with GI or general surgery               Subjective: Still constipated a little.  No new complaints, no nursing concerns.         Physical Exam: BP 127/71 (BP Location: Left Arm)   Pulse 73   Temp 98.6 F (37 C) (Oral)   Resp 16   Ht 5' 9 (1.753 m)   Wt 80.4 kg   SpO2 99%   BMI 26.18 kg/m   Elderly adult male, sitting up in recliner, interactive and appropriate RRR, no murmurs, no peripheral edema Respiratory rate normal, lung sounds diminished, poor lung excursion Attention normal, affect appropriate, judgment and insight appear normal           Disposition: Status is: Inpatient The patient was admitted with rib fractures and pelvic fracture.  These are nonoperative, but he is significantly limited by pain, and will require rehabilitation return to  his prior level of function.  At the moment he does not feel safe discharging home and physical therapy have recommended SNF.  Medically ready for discharge when SNF bed is found        Author: Lonni SHAUNNA Dalton, MD 08/08/2023 6:29 PM  For on call review [email or web address]

## 2023-08-08 NOTE — Progress Notes (Signed)
 Physical Therapy Treatment  Patient Details Name: [name] MRN: [id number] DOB: 1936/07/11 Today's Date: 08/08/2023   History of Present Illness Pt is an 88 y/o male who presents 08/01/23 s/p mechanical fall. He sustained a L pubic ramus fx and R rib fractures 4-7. PMH significant for paroxysmal a-fib chronically anticoagulated, chronic dHF, essential HTN, severe mitral regurgitation s/p mitral valve clip in August 2023, CKD.    PT Comments  Pt seen for a second session in an attempt to progress mobility for potential d/c home. Long discussion with pt regarding SNF vs home with HHPT/OT to follow up. Focus of session was stair training as pt identified that as a barrier to returning home. He required min assist for safe negotiation of stairs. Pt reports concerns with returning home at this time, and states he has many questions. Will continue to follow and progress as able per POC.    If plan is discharge home, recommend the following: A lot of help with walking and/or transfers;A lot of help with bathing/dressing/bathroom;Assistance with cooking/housework;Assist for transportation;Help with stairs or ramp for entrance   Can travel by private vehicle     No  Equipment Recommendations  Rolling walker (2 wheels);BSC/3in1    Recommendations for Other Services       Precautions / Restrictions Precautions Precautions: Fall Precaution Comments: R rib fx's, orthostatic, wrap R knee with ACE wrap for pain control - remove after PT Restrictions Weight Bearing Restrictions Per Provider Order: Yes LLE Weight Bearing Per Provider Order: Weight bearing as tolerated     Mobility  Bed Mobility               General bed mobility comments: Pt was received sitting up in the recliner.    Transfers Overall transfer level: Needs assistance Equipment used: Rolling walker (2 wheels) Transfers: Sit to/from Stand Sit to Stand: Supervision, From elevated surface           General  transfer comment: Pt demonstrated proper hand placement on seated surface for safety. No assist required.    Ambulation/Gait Ambulation/Gait assistance: Supervision Gait Distance (Feet): 10 Feet Assistive device: Rolling walker (2 wheels) Gait Pattern/deviations: Decreased stride length, Trunk flexed, Narrow base of support, Step-through pattern Gait velocity: Decreased Gait velocity interpretation: <1.31 ft/sec, indicative of household ambulator   General Gait Details: Slow but generally steady with RW for support.   Stairs Stairs: Yes Stairs assistance: Min assist Stair Management: Two rails, Step to pattern, Forwards Number of Stairs: 5 General stair comments: VC's for sequencing and general safety. Pt reports pain with LLE weight bearing to advance RLE up to next step.   Wheelchair Mobility     Tilt Bed    Modified Rankin (Stroke Patients Only)       Balance Overall balance assessment: Needs assistance, History of Falls Sitting-balance support: Single extremity supported, Feet supported Sitting balance-Leahy Scale: Fair     Standing balance support: Bilateral upper extremity supported, During functional activity, Reliant on assistive device for balance Standing balance-Leahy Scale: Fair Standing balance comment: Statically                            Cognition Arousal: Alert Behavior During Therapy: WFL for tasks assessed/performed Overall Cognitive Status: Within Functional Limits for tasks assessed  Exercises      General Comments        Pertinent Vitals/Pain Pain Assessment Pain Assessment: Faces Faces Pain Scale: Hurts even more Pain Location: L hip/pelvis Pain Descriptors / Indicators: Sore, Stabbing, Grimacing, Guarding Pain Intervention(s): Limited activity within patient's tolerance, Monitored during session, Repositioned    Home Living                           Prior Function            PT Goals (current goals can now be found in the care plan section) Acute Rehab PT Goals Patient Stated Goal: Be able to return home with wife PT Goal Formulation: With patient Time For Goal Achievement: 08/17/23 Potential to Achieve Goals: Good Progress towards PT goals: Progressing toward goals    Frequency    Min 1X/week      PT Plan      Co-evaluation              AM-PAC PT 6 Clicks Mobility   Outcome Measure  Help needed turning from your back to your side while in a flat bed without using bedrails?: A Little Help needed moving from lying on your back to sitting on the side of a flat bed without using bedrails?: A Little Help needed moving to and from a bed to a chair (including a wheelchair)?: A Little Help needed standing up from a chair using your arms (e.g., wheelchair or bedside chair)?: A Little Help needed to walk in hospital room?: A Little Help needed climbing 3-5 steps with a railing? : A Little 6 Click Score: 18    End of Session Equipment Utilized During Treatment: Gait belt Activity Tolerance: Patient tolerated treatment well Patient left: with call bell/phone within reach;in chair;with chair alarm set Nurse Communication: Mobility status PT Visit Diagnosis: Unsteadiness on feet (R26.81);Pain Pain - Right/Left: Left Pain - part of body: Hip     Time: 8651-8582 PT Time Calculation (min) (ACUTE ONLY): 29 min  Charges:    $Gait Training: 23-37 mins PT General Charges $$ ACUTE PT VISIT: 1 Visit                     [name], PT, DPT Acute Rehabilitation Services Secure Chat Preferred Office: [phone]    [name] 08/08/2023, 3:59 PM

## 2023-08-08 NOTE — Plan of Care (Signed)
  Problem: Education: Goal: Knowledge of discharge needs will improve Outcome: Progressing   Problem: Clinical Measurements: Goal: Postoperative complications will be avoided or minimized Outcome: Progressing   Problem: Respiratory: Goal: Ability to achieve and maintain a regular respiratory rate will improve Outcome: Progressing   Problem: Skin Integrity: Goal: Demonstration of wound healing without infection will improve Outcome: Progressing   

## 2023-08-09 DIAGNOSIS — S2241XD Multiple fractures of ribs, right side, subsequent encounter for fracture with routine healing: Secondary | ICD-10-CM | POA: Diagnosis not present

## 2023-08-09 DIAGNOSIS — E785 Hyperlipidemia, unspecified: Secondary | ICD-10-CM | POA: Diagnosis not present

## 2023-08-09 DIAGNOSIS — S2241XA Multiple fractures of ribs, right side, initial encounter for closed fracture: Secondary | ICD-10-CM | POA: Diagnosis not present

## 2023-08-09 DIAGNOSIS — R2689 Other abnormalities of gait and mobility: Secondary | ICD-10-CM | POA: Diagnosis not present

## 2023-08-09 DIAGNOSIS — E039 Hypothyroidism, unspecified: Secondary | ICD-10-CM | POA: Diagnosis not present

## 2023-08-09 DIAGNOSIS — I10 Essential (primary) hypertension: Secondary | ICD-10-CM | POA: Diagnosis not present

## 2023-08-09 DIAGNOSIS — Z743 Need for continuous supervision: Secondary | ICD-10-CM | POA: Diagnosis not present

## 2023-08-09 DIAGNOSIS — I5032 Chronic diastolic (congestive) heart failure: Secondary | ICD-10-CM | POA: Diagnosis not present

## 2023-08-09 DIAGNOSIS — W19XXXD Unspecified fall, subsequent encounter: Secondary | ICD-10-CM | POA: Diagnosis not present

## 2023-08-09 DIAGNOSIS — I480 Paroxysmal atrial fibrillation: Secondary | ICD-10-CM | POA: Diagnosis not present

## 2023-08-09 DIAGNOSIS — S32592A Other specified fracture of left pubis, initial encounter for closed fracture: Secondary | ICD-10-CM | POA: Diagnosis not present

## 2023-08-09 DIAGNOSIS — N1832 Chronic kidney disease, stage 3b: Secondary | ICD-10-CM | POA: Diagnosis not present

## 2023-08-09 DIAGNOSIS — S32592D Other specified fracture of left pubis, subsequent encounter for fracture with routine healing: Secondary | ICD-10-CM | POA: Diagnosis not present

## 2023-08-09 DIAGNOSIS — R531 Weakness: Secondary | ICD-10-CM | POA: Diagnosis not present

## 2023-08-09 DIAGNOSIS — R262 Difficulty in walking, not elsewhere classified: Secondary | ICD-10-CM | POA: Diagnosis not present

## 2023-08-09 DIAGNOSIS — I4891 Unspecified atrial fibrillation: Secondary | ICD-10-CM | POA: Diagnosis not present

## 2023-08-09 DIAGNOSIS — M810 Age-related osteoporosis without current pathological fracture: Secondary | ICD-10-CM | POA: Diagnosis not present

## 2023-08-09 DIAGNOSIS — R2681 Unsteadiness on feet: Secondary | ICD-10-CM | POA: Diagnosis not present

## 2023-08-09 DIAGNOSIS — S3289XA Fracture of other parts of pelvis, initial encounter for closed fracture: Secondary | ICD-10-CM | POA: Diagnosis not present

## 2023-08-09 LAB — COMPREHENSIVE METABOLIC PANEL
ALT: 17 U/L (ref 0–44)
AST: 18 U/L (ref 15–41)
Albumin: 2.8 g/dL — ABNORMAL LOW (ref 3.5–5.0)
Alkaline Phosphatase: 55 U/L (ref 38–126)
Anion gap: 9 (ref 5–15)
BUN: 29 mg/dL — ABNORMAL HIGH (ref 8–23)
CO2: 26 mmol/L (ref 22–32)
Calcium: 9 mg/dL (ref 8.9–10.3)
Chloride: 104 mmol/L (ref 98–111)
Creatinine, Ser: 1.9 mg/dL — ABNORMAL HIGH (ref 0.61–1.24)
GFR, Estimated: 34 mL/min — ABNORMAL LOW (ref >60)
Glucose, Bld: 92 mg/dL (ref 70–99)
Potassium: 4.5 mmol/L (ref 3.5–5.1)
Sodium: 139 mmol/L (ref 135–145)
Total Bilirubin: 1.1 mg/dL (ref 0.0–1.2)
Total Protein: 5.7 g/dL — ABNORMAL LOW (ref 6.5–8.1)

## 2023-08-09 LAB — CBC
HCT: 38.5 % — ABNORMAL LOW (ref 39.0–52.0)
Hemoglobin: 12.7 g/dL — ABNORMAL LOW (ref 13.0–17.0)
MCH: 31.3 pg (ref 26.0–34.0)
MCHC: 33 g/dL (ref 30.0–36.0)
MCV: 94.8 fL (ref 80.0–100.0)
Platelets: 179 10*3/uL (ref 150–400)
RBC: 4.06 MIL/uL — ABNORMAL LOW (ref 4.22–5.81)
RDW: 14.3 % (ref 11.5–15.5)
WBC: 6.9 10*3/uL (ref 4.0–10.5)
nRBC: 0 % (ref 0.0–0.2)

## 2023-08-09 MED ORDER — POLYETHYLENE GLYCOL 3350 17 G PO PACK: 17.0000 g | PACK | Freq: Every day | ORAL | Status: AC | PRN

## 2023-08-09 MED ORDER — TRAMADOL HCL 50 MG PO TABS: 50.0000 mg | ORAL_TABLET | Freq: Four times a day (QID) | ORAL | 0 refills | Status: AC | PRN

## 2023-08-09 NOTE — Progress Notes (Signed)
 Report given to [facility] nurse at [facility], all questions and concerns were fully answered. Discharge packet(AVS)/necessary documents provided to [facility], Pt d/c to SNF as ordered, He remains alert/oriented in no apparent distress. No complaints.

## 2023-08-09 NOTE — Discharge Summary (Signed)
 Physician Discharge Summary  [name] FMW:[id number] DOB: 05-17-1936 DOA: 08/01/2023  PCP: Arloa Elsie SAUNDERS, MD  Admit date: 08/01/2023 Discharge date: 08/09/2023  Admitted From: Home Disposition: SNF  Recommendations for Outpatient Follow-up:  Follow up with PCP in 1-2 weeks Outpatient follow-up with gastroenterology/general surgery for pancreatic tail lesion Continue Tylenol /tramadol  as needed for pain control  Discharge Condition: Stable CODE STATUS: Full code Diet recommendation: Heart healthy/consistent carbohydrate diet  History of present illness:  [name] is a 88 y.o. male with past medical history significant for paroxysmal atrial fibrillation on Eliquis , chronic diastolic congestive heart failure, HTN, severe mitral gravitation s/p mitral valve clip (August 2023), CKD stage IIIb (baseline creatinine 1.6-2.0) who presented to [facility] ED on 12/30 following mechanical fall with complaint of right-sided rib, left hip and right shoulder pain.  Patient reports he was utilizing a stepstool to get in and out of the bed of his truck when he slipped and fell onto his right side.  Patient unsure if he struck his head but denies any loss of consciousness.  Denies headache or acute neck pain.  Reports that he is compliant with his Eliquis .  Further denies palpitations, no chest pain, no diaphoresis, no nausea/vomiting, no diet dizziness, no visual changes, no fever/chills, no myalgias, no urinary symptoms.   In the ED, temperature 97.7 F, HR 66, RR 18, BP 147/86, SpO2 96% on room air.  WBC 13.9, hemoglobin 13.8, platelet count 152.  Sodium 142, potassium 4.3, chloride 106, CO2 24, glucose 120, BUN 25, creatinine 0.99.  CT head/C-spine without contrast with no acute intracranial abnormality, no acute displaced fracture or traumatic listhesis of the C-spine.  CT chest/abdomen/pelvis with acute nondisplaced right 4-7 rib fractures, no acute intrathoracic, intra-abdominal, intrapelvic  traumatic injury, interval increase size of 2.4 x 2.5 cm pancreatic tail fluid density lesion, cardiomegaly, cholelithiasis without cholecystitis, prostamegaly, aortic atherosclerosis, small right inguinal hernia containing fat and mesentery.  Right shoulder negative for acute traumatic injury.  Pelvis x-ray negative for acute traumatic injury.  Chest x-ray with no active cardiopulmonary disease process.  Trauma surgery consulted.  Patient was given fentanyl  50 mcg IV x 1, Dilaudid  0.5 mg IV x 1, NS 500 mL bolus.  TRH consulted for admission for further evaluation and management/pain control following fall resulting in right-sided rib fractures.  Hospital course:  Acute nondisplaced 4th through 7th rib fractures on the right Mechanical fall Patient presenting to the ED with right-sided rib pain, right shoulder pain, left hip pain following mechanical fall after he slipped off a stepstool.  Imaging unremarkable except for CT chest/abdomen/pelvis notable for acute nondisplaced right 4-7 rib fractures without any further intra-thoracic/abdominal/intrapelvic traumatic injury.  X-ray left femur, knee, tibia/tibia but no acute osseous abnormality.  Trauma surgery was consulted and followed during the initial hospital course.  PT/OT recommend SNF and discharging to rehab for further treatment.  Continue pain control with Tylenol  and tramadol  as needed.  Continue to monitor bowel movements to ensure no significant constipation.  Left inferior pubic ramus fracture, nondisplaced CT left hip with subtle cortical irregularity of the left inferior pubic ramus concerning for a nondisplaced fracture, no further fracture identified. Weightbearing as tolerated.  Continue therapy efforts at rehab.   Pancreatic tail lesion CT abdomen/pelvis notable for increase in size of a pancreatic tail fluid density measuring now 2.4 x 2.5 cm. Outpatient follow-up with GI/general surgery   Paroxysmal atrial fibrillation Chronic  diastolic congestive heart failure Essential hypertension Continue amlodipine  2.5 mg p.o. daily,  Amiodarone  200 mg p.o. daily, Aspirin  81 mg p.o. daily, Furosemide  20 mg every other day, Eliquis  2.5 mg p.o. twice daily, Farxiga .   Hyperlipidemia Atorvastatin  80 mg p.o. daily, Zetia  10 mg p.o. daily   Hypothyroidism Levothyroxine  50 mcg p.o. daily   CKD stage IIIb Baseline creatinine 1.6-2.0.,  Creatinine 1.90 at time of discharge.   GERD Protonix  40 mg p.o. daily  Discharge Diagnoses:  Principal Problem:   Multiple rib fractures Active Problems:   Essential hypertension   Fall at home, initial encounter   Leukocytosis   Paroxysmal atrial fibrillation (HCC)   Chronic diastolic CHF (congestive heart failure) (HCC)   Acquired hypothyroidism   BPH (benign prostatic hyperplasia)   CKD stage 3b, GFR 30-44 ml/min [facility])    Discharge Instructions  Discharge Instructions     Diet - low sodium heart healthy   Complete by: As directed    Increase activity slowly   Complete by: As directed       Allergies as of 08/09/2023   No Known Allergies      Medication List     TAKE these medications    acetaminophen  500 MG tablet Commonly known as: TYLENOL  Take 500 mg by mouth every 6 (six) hours as needed for moderate pain.   amiodarone  200 MG tablet Commonly known as: PACERONE  Take 1 tablet (200 mg total) by mouth daily.   amLODipine  2.5 MG tablet Commonly known as: NORVASC  Take 1 tablet (2.5 mg total) by mouth daily.   apixaban  5 MG Tabs tablet Commonly known as: ELIQUIS  Take 1/2 tablet by mouth twice daily   aspirin  EC 81 MG tablet Take 1 tablet (81 mg total) by mouth daily. Swallow whole.   atorvastatin  80 MG tablet Commonly known as: LIPITOR  Take 1 tablet (80 mg total) by mouth at bedtime.   B-12 5000 MCG Caps Take 5,000 mcg by mouth daily.   Cranberry 500 MG Tabs Take 1 tablet by mouth at bedtime.   dapagliflozin  propanediol 10 MG Tabs tablet Commonly  known as: Farxiga  Take 1 tablet (10 mg total) by mouth daily before breakfast.   ezetimibe  10 MG tablet Commonly known as: ZETIA  Take 10 mg by mouth daily.   ferrous sulfate  325 (65 FE) MG tablet Take 325 mg by mouth daily with breakfast.   finasteride  5 MG tablet Commonly known as: PROSCAR  Take 5 mg by mouth daily.   folic acid  800 MCG tablet Commonly known as: FOLVITE  Take 800 mcg by mouth daily.   furosemide  20 MG tablet Commonly known as: LASIX  Take 1 tablet (20 mg total) by mouth every other day.   levothyroxine  50 MCG tablet Commonly known as: SYNTHROID  Take 50 mcg by mouth every morning.   meclizine 25 MG tablet Commonly known as: ANTIVERT Take 25 mg by mouth daily as needed for dizziness.   niacin  500 MG tablet Commonly known as: VITAMIN B3 Take 500 mg by mouth at bedtime.   nitroGLYCERIN  0.4 MG SL tablet Commonly known as: NITROSTAT  Place 1 tablet (0.4 mg total) under the tongue every 5 (five) minutes as needed for chest pain.   pantoprazole  40 MG tablet Commonly known as: PROTONIX  TAKE ONE TABLET BY MOUTH EVERY DAY   polyethylene glycol 17 g packet Commonly known as: MIRALAX  / GLYCOLAX  Take 17 g by mouth daily as needed for mild constipation.   traMADol  50 MG tablet Commonly known as: Ultram  Take 1 tablet (50 mg total) by mouth every 6 (six) hours as needed for moderate pain (  pain score 4-6).   Vitamin D 50 MCG (2000 UT) tablet Take 2,000 Units by mouth daily.        Follow-up Information     Arloa Elsie SAUNDERS, MD. Schedule an appointment as soon as possible for a visit in 1 week(s).   Specialty: Family Medicine Contact information: [address] [employer] [place] [address] [phone]                No Known Allergies  Consultations: Trauma surgery   Procedures/Studies: CT HIP LEFT WO CONTRAST Result Date: 08/02/2023 CLINICAL DATA:  Fall from ladder.  Pain. EXAM: CT OF THE LEFT HIP WITHOUT CONTRAST TECHNIQUE:  Multidetector CT imaging of the left hip was performed according to the standard protocol. Multiplanar CT image reconstructions were also generated. RADIATION DOSE REDUCTION: This exam was performed according to the departmental dose-optimization program which includes automated exposure control, adjustment of the mA and/or kV according to patient size and/or use of iterative reconstruction technique. COMPARISON:  CT chest, abdomen, and pelvis dated 08/01/2023. CT abdomen/pelvis dated 10/27/2021. FINDINGS: Bones/Joint/Cartilage Subtle cortical irregularity at the medial and lateral cortices of the left inferior pubic ramus (series 3, images 103-108). Remainder of the visualized bones appear intact. The left femoral head is seated within the acetabulum. Left greater trochanteric enthesopathy. The pubic symphysis is anatomically aligned. Mild joint space narrowing and marginal osteophytosis of the left hip. Ligaments Ligaments are suboptimally evaluated by CT. Muscles and Tendons Muscles are normal for age.  No intramuscular collection identified. Soft tissue No fluid collection or hematoma. The visualized intrapelvic contents are grossly unchanged. Excreted vicarious contrast is noted within bladder. IMPRESSION: Subtle cortical irregularity of the left inferior pubic ramus is concerning for a nondisplaced fracture. No additional fracture identified. Electronically Signed   By: Harrietta Sherry M.D.   On: 08/02/2023 14:24   DG FEMUR MIN 2 VIEWS LEFT Result Date: 08/02/2023 CLINICAL DATA:  [id number] Fall from ladder [phone]. EXAM: LEFT FEMUR 2 VIEWS; LEFT KNEE - COMPLETE 4+ VIEW; LEFT TIBIA AND FIBULA - 2 VIEW COMPARISON:  None Available. FINDINGS: Bone mineralization within normal limits for patient's age. No acute fracture or dislocation. No aggressive osseous lesion. There are mild degenerative changes of the hip joint without significant joint space narrowing. Osteophytosis of the superior acetabulum. Patient is  status post left total knee arthroplasty with patellar resurfacing. No periprosthetic fracture or lucency. The hardware is intact. There is near anatomic alignment. The ankle mortise is intact. Mild degenerative changes of ankle joint. No focal soft tissue swelling. No radiopaque foreign bodies. IMPRESSION: *No acute osseous abnormality of the left femur, knee or leg. Electronically Signed   By: Ree Molt M.D.   On: 08/02/2023 10:43   DG Knee Complete 4 Views Left Result Date: 08/02/2023 CLINICAL DATA:  [id number] Fall from ladder [phone]. EXAM: LEFT FEMUR 2 VIEWS; LEFT KNEE - COMPLETE 4+ VIEW; LEFT TIBIA AND FIBULA - 2 VIEW COMPARISON:  None Available. FINDINGS: Bone mineralization within normal limits for patient's age. No acute fracture or dislocation. No aggressive osseous lesion. There are mild degenerative changes of the hip joint without significant joint space narrowing. Osteophytosis of the superior acetabulum. Patient is status post left total knee arthroplasty with patellar resurfacing. No periprosthetic fracture or lucency. The hardware is intact. There is near anatomic alignment. The ankle mortise is intact. Mild degenerative changes of ankle joint. No focal soft tissue swelling. No radiopaque foreign bodies. IMPRESSION: *No acute osseous abnormality of the left femur, knee  or leg. Electronically Signed   By: Ree Molt M.D.   On: 08/02/2023 10:43   DG Tibia/Fibula Left Result Date: 08/02/2023 CLINICAL DATA:  [id number] Fall from ladder [phone]. EXAM: LEFT FEMUR 2 VIEWS; LEFT KNEE - COMPLETE 4+ VIEW; LEFT TIBIA AND FIBULA - 2 VIEW COMPARISON:  None Available. FINDINGS: Bone mineralization within normal limits for patient's age. No acute fracture or dislocation. No aggressive osseous lesion. There are mild degenerative changes of the hip joint without significant joint space narrowing. Osteophytosis of the superior acetabulum. Patient is status post left total knee arthroplasty with patellar  resurfacing. No periprosthetic fracture or lucency. The hardware is intact. There is near anatomic alignment. The ankle mortise is intact. Mild degenerative changes of ankle joint. No focal soft tissue swelling. No radiopaque foreign bodies. IMPRESSION: *No acute osseous abnormality of the left femur, knee or leg. Electronically Signed   By: Ree Molt M.D.   On: 08/02/2023 10:43   CT L-SPINE NO CHARGE Result Date: 08/01/2023 CLINICAL DATA:  [id number] Fall [id number] EXAM: CT THORACIC AND LUMBAR SPINE WITHOUT CONTRAST TECHNIQUE: Multidetector CT imaging of the thoracic and lumbar spine was performed without contrast. Multiplanar CT image reconstructions were also generated. RADIATION DOSE REDUCTION: This exam was performed according to the departmental dose-optimization program which includes automated exposure control, adjustment of the mA and/or kV according to patient size and/or use of iterative reconstruction technique. COMPARISON:  CT renal 10/27/2021: Chest x-ray 03/23/2022 FINDINGS: CT THORACIC SPINE FINDINGS Alignment: Normal. Vertebrae: Diffusely decreased bone density. Multilevel moderate degenerative changes spine. Mark Schmorl node along the superior endplate of T7. T5 vertebral body hemangioma. No acute fracture or focal pathologic process. Paraspinal and other soft tissues: Negative. Disc levels: Multilevel intervertebral disc space narrowing. CT LUMBAR SPINE FINDINGS Segmentation: 5 lumbar type vertebrae. Alignment: Stable grade 1 anterolisthesis of L4 on L5. Vertebrae: Diffusely decreased bone density. Multilevel moderate degenerative changes of the spine. L4-L5 posterolateral surgical hardware. No acute fracture or focal pathologic process. Paraspinal and other soft tissues: Negative. Disc levels: Multilevel intervertebral disc space vacuum phenomenon. IMPRESSION: CT THORACIC SPINE IMPRESSION No acute displaced fracture or traumatic listhesis of the thoracic spine. CT LUMBAR SPINE IMPRESSION No  acute displaced fracture or traumatic listhesis of the lumbar spine. Electronically Signed   By: Morgane  Naveau M.D.   On: 08/01/2023 23:04   CT T-SPINE NO CHARGE Result Date: 08/01/2023 CLINICAL DATA:  [id number] Fall [id number] EXAM: CT THORACIC AND LUMBAR SPINE WITHOUT CONTRAST TECHNIQUE: Multidetector CT imaging of the thoracic and lumbar spine was performed without contrast. Multiplanar CT image reconstructions were also generated. RADIATION DOSE REDUCTION: This exam was performed according to the departmental dose-optimization program which includes automated exposure control, adjustment of the mA and/or kV according to patient size and/or use of iterative reconstruction technique. COMPARISON:  CT renal 10/27/2021: Chest x-ray 03/23/2022 FINDINGS: CT THORACIC SPINE FINDINGS Alignment: Normal. Vertebrae: Diffusely decreased bone density. Multilevel moderate degenerative changes spine. Mark Schmorl node along the superior endplate of T7. T5 vertebral body hemangioma. No acute fracture or focal pathologic process. Paraspinal and other soft tissues: Negative. Disc levels: Multilevel intervertebral disc space narrowing. CT LUMBAR SPINE FINDINGS Segmentation: 5 lumbar type vertebrae. Alignment: Stable grade 1 anterolisthesis of L4 on L5. Vertebrae: Diffusely decreased bone density. Multilevel moderate degenerative changes of the spine. L4-L5 posterolateral surgical hardware. No acute fracture or focal pathologic process. Paraspinal and other soft tissues: Negative. Disc levels: Multilevel intervertebral disc space vacuum phenomenon. IMPRESSION: CT THORACIC SPINE IMPRESSION No acute displaced fracture  or traumatic listhesis of the thoracic spine. CT LUMBAR SPINE IMPRESSION No acute displaced fracture or traumatic listhesis of the lumbar spine. Electronically Signed   By: Morgane  Naveau M.D.   On: 08/01/2023 23:04   CT CHEST ABDOMEN PELVIS W CONTRAST Result Date: 08/01/2023 CLINICAL DATA:  Polytrauma, blunt Level 2  fall on thinners Pt arrived POV d/t mechanical fall. Pt is on Eliquis  and does not know if hit head. Clemens getting out bed of truck. Pt c/o right rib pain left hip and right shoulder. Fell 6 hours ago. EXAM: CT CHEST, ABDOMEN, AND PELVIS WITH CONTRAST TECHNIQUE: Multidetector CT imaging of the chest, abdomen and pelvis was performed following the standard protocol during bolus administration of intravenous contrast. RADIATION DOSE REDUCTION: This exam was performed according to the departmental dose-optimization program which includes automated exposure control, adjustment of the mA and/or kV according to patient size and/or use of iterative reconstruction technique. CONTRAST:  75mL OMNIPAQUE  IOHEXOL  350 MG/ML SOLN COMPARISON:  CT renal 10/27/2021 FINDINGS: CHEST: Cardiovascular: No aortic injury. The thoracic aorta is normal in caliber. Enlarged bilateral atria. No significant pericardial effusion. Surgical hardware along the mitral valve. Aortic valve leaflet calcification. Four-vessel coronary calcifications Mediastinum/Nodes: No pneumomediastinum. No mediastinal hematoma. The esophagus is unremarkable.  Small hiatal hernia. The thyroid  is unremarkable. The central airways are patent. No mediastinal, hilar, or axillary lymphadenopathy. Lungs/Pleura: Bilateral lower lobe atelectasis. No focal consolidation. Subpleural triangular nodules likely intrapulmonary lymph node. No pulmonary mass. No pulmonary contusion or laceration. No pneumatocele formation. No pleural effusion. No pneumothorax. No hemothorax. Musculoskeletal/Chest wall: No chest wall mass. Acute nondisplaced right 4-7 rib fractures. No acute displaced rib or sternal fracture. Total right shoulder arthroplasty. Please see separately dictated CT thoracolumbar spine. ABDOMEN / PELVIS: Hepatobiliary: Not enlarged. No focal lesion. No laceration or subcapsular hematoma. Calcified gallstones within the gallbladder lumen. No biliary ductal dilatation.  Pancreas: Atrophic parenchyma. Otherwise normal pancreatic contour. No main pancreatic duct dilatation. Interval increase in size of a 2.4 x 2.5 cm (from 1.5 cm) pancreatic tail fluid density lesion. Spleen: Not enlarged. No focal lesion. No laceration, subcapsular hematoma, or vascular injury. Adrenals/Urinary Tract: No nodularity bilaterally. Bilateral kidneys enhance symmetrically. A 2.5 cm hypodense lesion within the right kidney CT stable in size with density of 32 Hounsfield units likely represents a simple renal cyst. Fluid density lesion likely represents a simple renal cyst. 3 mm calcified stone within the left kidney. Simple renal cysts, in the absence of clinically indicated signs/symptoms, require no independent follow-up. No hydronephrosis. No contusion, laceration, or subcapsular hematoma. No injury to the vascular structures or collecting systems. No hydroureter. The urinary bladder is unremarkable. Stomach/Bowel: No small or large bowel wall thickening or dilatation. The appendix is unremarkable. Vasculature/Lymphatics: Severe atherosclerotic plaque. No abdominal aorta or iliac aneurysm. No active contrast extravasation or pseudoaneurysm. No abdominal, pelvic, inguinal lymphadenopathy. Reproductive: Enlarged prostate measuring up to 5.3 cm. Other: No simple free fluid ascites. No pneumoperitoneum. No hemoperitoneum. No mesenteric hematoma identified. No organized fluid collection. Musculoskeletal: No significant soft tissue hematoma. Small fat containing right inguinal hernia with tethering of the urinary bladder lumen. No acute pelvic fracture. Please see separately dictated CT thoracolumbar spine. Ports and Devices: None. IMPRESSION: 1. Acute nondisplaced right 4-7 rib fractures. 2. No acute intrathoracic, intra-abdominal, intrapelvic traumatic injury. 3. Please see separately dictated CT thoracolumbar spine. Other imaging findings of potential clinical significance: 1. Interval increase in size of  a 2.4 x 2.5 cm (from 1.5 cm) pancreatic tail fluid density lesion.  2. Cardiomegaly. 3. Small hiatal hernia. 4. Cholelithiasis with no acute cholecystitis. 5. Prostatomegaly. 6. Aortic Atherosclerosis (ICD10-I70.0) including four-vessel coronary calcification and aortic valve with calcification-correlate with aortic stenosis. 7. Small right inguinal hernia containing fat and mesentery tethering the urinary bladder lumen. Electronically Signed   By: Morgane  Naveau M.D.   On: 08/01/2023 22:58   CT Head Wo Contrast Result Date: 08/01/2023 CLINICAL DATA:  Head trauma, moderate-severe; Neck trauma (Age >= 65y) EXAM: CT HEAD WITHOUT CONTRAST CT CERVICAL SPINE WITHOUT CONTRAST TECHNIQUE: Multidetector CT imaging of the head and cervical spine was performed following the standard protocol without intravenous contrast. Multiplanar CT image reconstructions of the cervical spine were also generated. RADIATION DOSE REDUCTION: This exam was performed according to the departmental dose-optimization program which includes automated exposure control, adjustment of the mA and/or kV according to patient size and/or use of iterative reconstruction technique. COMPARISON:  MRI head 04/30/2023 FINDINGS: CT HEAD FINDINGS Brain: Cerebral ventricle sizes are concordant with the degree of cerebral volume loss. Patchy and confluent areas of decreased attenuation are noted throughout the deep and periventricular white matter of the cerebral hemispheres bilaterally, compatible with chronic microvascular ischemic disease. No evidence of large-territorial acute infarction. No parenchymal hemorrhage. No mass lesion. No extra-axial collection. No mass effect or midline shift. No hydrocephalus. Basilar cisterns are patent. Vascular: No hyperdense vessel. Atherosclerotic calcifications are present within the cavernous internal carotid arteries. Skull: No acute fracture or focal lesion. Sinuses/Orbits: Paranasal sinuses and mastoid air cells are  clear. Bilateral lens replacement. Otherwise the orbits are unremarkable. Other: None. CT CERVICAL SPINE FINDINGS Alignment: Normal. Skull base and vertebrae: Multilevel severe degenerative changes of the spine with posterior disc osteophyte complex formation. Associated severe bilateral C4-C5 osseous neural foraminal stenosis. No severe osseous central canal stenosis. No acute fracture. No aggressive appearing focal osseous lesion or focal pathologic process. Soft tissues and spinal canal: No prevertebral fluid or swelling. No visible canal hematoma. Upper chest: Unremarkable. Other: Atherosclerotic plaque of the carotid arteries within the neck. Right carotid artery stent. IMPRESSION: 1. No acute intracranial abnormality. 2. No acute displaced fracture or traumatic listhesis of the cervical spine. Electronically Signed   By: Morgane  Naveau M.D.   On: 08/01/2023 22:40   CT Cervical Spine Wo Contrast Result Date: 08/01/2023 CLINICAL DATA:  Head trauma, moderate-severe; Neck trauma (Age >= 65y) EXAM: CT HEAD WITHOUT CONTRAST CT CERVICAL SPINE WITHOUT CONTRAST TECHNIQUE: Multidetector CT imaging of the head and cervical spine was performed following the standard protocol without intravenous contrast. Multiplanar CT image reconstructions of the cervical spine were also generated. RADIATION DOSE REDUCTION: This exam was performed according to the departmental dose-optimization program which includes automated exposure control, adjustment of the mA and/or kV according to patient size and/or use of iterative reconstruction technique. COMPARISON:  MRI head 04/30/2023 FINDINGS: CT HEAD FINDINGS Brain: Cerebral ventricle sizes are concordant with the degree of cerebral volume loss. Patchy and confluent areas of decreased attenuation are noted throughout the deep and periventricular white matter of the cerebral hemispheres bilaterally, compatible with chronic microvascular ischemic disease. No evidence of  large-territorial acute infarction. No parenchymal hemorrhage. No mass lesion. No extra-axial collection. No mass effect or midline shift. No hydrocephalus. Basilar cisterns are patent. Vascular: No hyperdense vessel. Atherosclerotic calcifications are present within the cavernous internal carotid arteries. Skull: No acute fracture or focal lesion. Sinuses/Orbits: Paranasal sinuses and mastoid air cells are clear. Bilateral lens replacement. Otherwise the orbits are unremarkable. Other: None. CT CERVICAL SPINE FINDINGS Alignment: Normal.  Skull base and vertebrae: Multilevel severe degenerative changes of the spine with posterior disc osteophyte complex formation. Associated severe bilateral C4-C5 osseous neural foraminal stenosis. No severe osseous central canal stenosis. No acute fracture. No aggressive appearing focal osseous lesion or focal pathologic process. Soft tissues and spinal canal: No prevertebral fluid or swelling. No visible canal hematoma. Upper chest: Unremarkable. Other: Atherosclerotic plaque of the carotid arteries within the neck. Right carotid artery stent. IMPRESSION: 1. No acute intracranial abnormality. 2. No acute displaced fracture or traumatic listhesis of the cervical spine. Electronically Signed   By: Morgane  Naveau M.D.   On: 08/01/2023 22:40   DG Pelvis Portable Result Date: 08/01/2023 CLINICAL DATA:  fall, level 2 EXAM: PORTABLE PELVIS 1-2 VIEWS COMPARISON:  CT abdomen pelvis 08/01/2023 FINDINGS: There is no evidence of pelvic fracture or diastasis. No acute displaced fracture or dislocation of either hips. No pelvic bone lesions are seen. L4-L5 surgical hardware. Vascular calcifications. IMPRESSION: Negative for acute traumatic injury. Electronically Signed   By: Morgane  Naveau M.D.   On: 08/01/2023 22:16   DG Shoulder Right Result Date: 08/01/2023 CLINICAL DATA:  Fall EXAM: RIGHT SHOULDER - 2+ VIEW COMPARISON:  None Available. FINDINGS: Total right shoulder arthroplasty.  There is no evidence of fracture or dislocation. Acromioclavicular joint degenerative changes. Soft tissues are unremarkable. IMPRESSION: Negative for acute traumatic injury. Electronically Signed   By: Morgane  Naveau M.D.   On: 08/01/2023 22:15   DG Chest Portable 1 View Result Date: 08/01/2023 CLINICAL DATA:  fall, level 2 EXAM: PORTABLE CHEST 1 VIEW COMPARISON:  Chest x-ray 03/23/2022 FINDINGS: The heart and mediastinal contours are within normal limits. Atherosclerotic plaque. No focal consolidation. No pulmonary edema. No pleural effusion. No pneumothorax. No acute osseous abnormality.  Total right shoulder arthroplasty. IMPRESSION: 1. No active disease. 2.  Aortic Atherosclerosis (ICD10-I70.0). Electronically Signed   By: Morgane  Naveau M.D.   On: 08/01/2023 22:14   VAS US  CAROTID Result Date: 07/28/2023 Carotid Arterial Duplex Study Patient Name:  Luismario L Streety  Date of Exam:   07/28/2023 Medical Rec #: [id number]       Accession #:    [id number] Date of Birth: 15-Dec-1935       Patient Gender: M Patient Age:   6 years Exam Location:  [facility] Procedure:      VAS US  CAROTID Referring Phys: FONDA ROBINS --------------------------------------------------------------------------------  Indications:       Right stent and 05/09/23 Right TCAR. Comparison Study:  04/30/23 Carotid duplex Performing Technologist: Norleen Buck RVT, RDMS  Examination Guidelines: A complete evaluation includes B-mode imaging, spectral Doppler, color Doppler, and power Doppler as needed of all accessible portions of each vessel. Bilateral testing is considered an integral part of a complete examination. Limited examinations for reoccurring indications may be performed as noted.  Right Carotid Findings: +----------+--------+--------+--------+------------------+--------+           PSV cm/sEDV cm/sStenosisPlaque DescriptionComments +----------+--------+--------+--------+------------------+--------+ CCA  Prox  70      10                                         +----------+--------+--------+--------+------------------+--------+ CCA Mid   76      13                                         +----------+--------+--------+--------+------------------+--------+  CCA Distal76      15                                         +----------+--------+--------+--------+------------------+--------+ ICA Prox  53      12      1-39%                     Stent    +----------+--------+--------+--------+------------------+--------+ ICA Mid   50      12                                Stent    +----------+--------+--------+--------+------------------+--------+ ICA Distal57      13                                         +----------+--------+--------+--------+------------------+--------+ ECA       95      12                                         +----------+--------+--------+--------+------------------+--------+ +----------+--------+-------+----------------+-------------------+           PSV cm/sEDV cmsDescribe        Arm Pressure (mmHG) +----------+--------+-------+----------------+-------------------+ [id number]            Multiphasic, TWO850                 +----------+--------+-------+----------------+-------------------+ +---------+--------+--+--------+-+---------+ VertebralPSV cm/s35EDV cm/s8Antegrade +---------+--------+--+--------+-+---------+  Left Carotid Findings: +----------+--------+--------+--------+-------------------------+--------+           PSV cm/sEDV cm/sStenosisPlaque Description       Comments +----------+--------+--------+--------+-------------------------+--------+ CCA Prox  75      16                                                +----------+--------+--------+--------+-------------------------+--------+ CCA Mid   109     22                                                 +----------+--------+--------+--------+-------------------------+--------+ CCA Distal95      16              heterogenous and calcific         +----------+--------+--------+--------+-------------------------+--------+ ICA Prox  83      23      1-39%   heterogenous                      +----------+--------+--------+--------+-------------------------+--------+ ICA Mid   73      19                                                +----------+--------+--------+--------+-------------------------+--------+ ICA Distal83      25                                                +----------+--------+--------+--------+-------------------------+--------+  ECA       99      15                                                +----------+--------+--------+--------+-------------------------+--------+ +----------+--------+--------+----------------+-------------------+           PSV cm/sEDV cm/sDescribe        Arm Pressure (mmHG) +----------+--------+--------+----------------+-------------------+ [id number]             Multiphasic, TWO862                 +----------+--------+--------+----------------+-------------------+ +---------+--------+--+--------+--+---------+ VertebralPSV cm/s79EDV cm/s14Antegrade +---------+--------+--+--------+--+---------+   Summary: Right Carotid: Velocities in the right ICA are consistent with a 1-39% stenosis.                Non-hemodynamically significant plaque <50% noted in the CCA.                Patent Right ICA stent. Left Carotid: Velocities in the left ICA are consistent with a 1-39% stenosis.               Non-hemodynamically significant plaque <50% noted in the CCA. Vertebrals:  Bilateral vertebral arteries demonstrate antegrade flow. Subclavians: Normal flow hemodynamics were seen in bilateral subclavian              arteries. *See table(s) above for measurements and observations.  Electronically signed by Fonda Rim on 07/28/2023 at  4:28:37 PM.    Final      Subjective: Patient seen examined bedside, resting comfortably.  Sitting in bedside chair.  RN present.  Discharging to SNF today.  Reports multiple bowel movements this morning, discussed with patient will change MiraLAX  to as needed as he is not requiring much pain medication at this time.  No other specific questions, concerns or complaints at this time.  Denies headache, no dizziness, no visual changes, no chest pain, no palpitations, no shortness of breath, no abdominal pain, no fever/chills/night sweats, no nausea/vomiting/diarrhea, no focal weakness, no fatigue, no paresthesias.  No acute events overnight per nursing staff.  Discharge Exam: Vitals:   08/09/23 0755 08/09/23 1009  BP: 108/68 108/68  Pulse: 78   Resp: 18   Temp: 98 F (36.7 C)   SpO2: 100%    Vitals:   08/08/23 2209 08/09/23 0516 08/09/23 0755 08/09/23 1009  BP: (!) 136/58 (!) 123/58 108/68 108/68  Pulse: 70 64 78   Resp: 16 17 18    Temp: 97.6 F (36.4 C) 97.8 F (36.6 C) 98 F (36.7 C)   TempSrc: Oral Oral Oral   SpO2: 99% 95% 100%   Weight:      Height:        Physical Exam: GEN: NAD, alert and oriented x 3, wd/wn HEENT: NCAT, PERRL, EOMI, sclera clear, MMM PULM: CTAB w/o wheezes/crackles, normal respiratory effort, on room air CV: RRR w/o M/G/R, + slight TTP right flank area surrounding rib fractures GI: abd soft, NTND, NABS, no R/G/M MSK: no peripheral edema, moves all extremities independently NEURO: CN II-XII intact, no focal deficits, sensation to light touch intact PSYCH: normal mood/affect Integumentary: dry/intact, no rashes or wounds    The results of significant diagnostics from this hospitalization (including imaging, microbiology, ancillary and laboratory) are listed below for reference.     Microbiology: No results found for this or any previous visit (from the past 240 hours).  Labs: BNP (last 3 results) Recent Labs    11/30/22 1129  BNP 612.9*    Basic Metabolic Panel: Recent Labs  Lab 08/03/23 0759 08/09/23 0614  NA 138 139  K 4.3 4.5  CL 106 104  CO2 25 26  GLUCOSE 93 92  BUN 23 29*  CREATININE 1.90* 1.90*  CALCIUM  8.9 9.0   Liver Function Tests: Recent Labs  Lab 08/09/23 0614  AST 18  ALT 17  ALKPHOS 55  BILITOT 1.1  PROT 5.7*  ALBUMIN  2.8*   No results for input(s): LIPASE, AMYLASE in the last 168 hours. No results for input(s): AMMONIA in the last 168 hours. CBC: Recent Labs  Lab 08/03/23 0759 08/09/23 0614  WBC 8.6 6.9  HGB 13.0 12.7*  HCT 39.9 38.5*  MCV 95.0 94.8  PLT 120* 179   Cardiac Enzymes: No results for input(s): CKTOTAL, CKMB, CKMBINDEX, TROPONINI in the last 168 hours. BNP: Invalid input(s): POCBNP CBG: No results for input(s): GLUCAP in the last 168 hours. D-Dimer No results for input(s): DDIMER in the last 72 hours. Hgb A1c No results for input(s): HGBA1C in the last 72 hours. Lipid Profile No results for input(s): CHOL, HDL, LDLCALC, TRIG, CHOLHDL, LDLDIRECT in the last 72 hours. Thyroid  function studies No results for input(s): TSH, T4TOTAL, T3FREE, THYROIDAB in the last 72 hours.  Invalid input(s): FREET3 Anemia work up No results for input(s): VITAMINB12, FOLATE, FERRITIN, TIBC, IRON, RETICCTPCT in the last 72 hours. Urinalysis    Component Value Date/Time   COLORURINE YELLOW 08/02/2023 0423   APPEARANCEUR CLEAR 08/02/2023 0423   LABSPEC 1.031 (H) 08/02/2023 0423   PHURINE 6.0 08/02/2023 0423   GLUCOSEU >=500 (A) 08/02/2023 0423   HGBUR NEGATIVE 08/02/2023 0423   BILIRUBINUR NEGATIVE 08/02/2023 0423   KETONESUR NEGATIVE 08/02/2023 0423   PROTEINUR NEGATIVE 08/02/2023 0423   UROBILINOGEN 0.2 05/25/2015 2115   NITRITE NEGATIVE 08/02/2023 0423   LEUKOCYTESUR NEGATIVE 08/02/2023 0423   Sepsis Labs Recent Labs  Lab 08/03/23 0759 08/09/23 0614  WBC 8.6 6.9   Microbiology No results found for this or any  previous visit (from the past 240 hours).   Time coordinating discharge: Over 30 minutes  SIGNED:   Camellia PARAS Flor Whitacre, DO  [facility] 08/09/2023, 10:25 AM

## 2023-08-09 NOTE — Plan of Care (Signed)
  Problem: Education: Goal: Knowledge of General Education information will improve Description: Including pain rating scale, medication(s)/side effects and non-pharmacologic comfort measures Outcome: Progressing   Problem: Health Behavior/Discharge Planning: Goal: Ability to manage health-related needs will improve Outcome: Progressing   Problem: Clinical Measurements: Goal: Ability to maintain clinical measurements within normal limits will improve Outcome: Progressing   Problem: Activity: Goal: Risk for activity intolerance will decrease Outcome: Progressing   Problem: Elimination: Goal: Will not experience complications related to bowel motility Outcome: Progressing   Problem: Pain Management: Goal: General experience of comfort will improve Outcome: Progressing   Problem: Safety: Goal: Ability to remain free from injury will improve Outcome: Progressing

## 2023-08-09 NOTE — TOC Transition Note (Signed)
 Transition of Care [facility]) - Discharge Note   Patient Details  Name: [name] MRN: [id number] Date of Birth: 1936-01-18  [facility]) CM/SW Contact:  Bridget Cordella Simmonds, LCSW Phone Number: 08/09/2023, 10:52 AM   Clinical Narrative:   Pt discharging to [facility], room 704. RN call report to [phone].     Final next level of care: Skilled Nursing Facility Barriers to Discharge: Barriers Resolved   Patient Goals and CMS Choice Patient states their goals for this hospitalization and ongoing recovery are:: back to normal CMS Medicare.gov Compare Post Acute Care list provided to:: Patient Choice offered to / list presented to : Patient      Discharge Placement              Patient chooses bed at:  [facility]) Patient to be transferred to facility by: ptar Name of family member notified: daughter Tillman Patient and family notified of of transfer: 08/09/23  Discharge Plan and Services Additional resources added to the After Visit Summary for   In-house Referral: Clinical Social Work   Post Acute Care Choice:  (TBD: SNF vs HH)                               Social Drivers of Health (SDOH) Interventions SDOH Screenings   Food Insecurity: No Food Insecurity (08/02/2023)  Housing: Unknown (08/02/2023)  Transportation Needs: No Transportation Needs (08/02/2023)  Utilities: Not At Risk (08/02/2023)  Social Connections: Moderately Integrated (08/07/2023)  Tobacco Use: Medium Risk (08/02/2023)     Readmission Risk Interventions     No data to display

## 2023-08-09 NOTE — Progress Notes (Signed)
 AVS Printed and placed with chart for transport services.

## 2023-08-09 NOTE — TOC Progression Note (Signed)
 Transition of Care [facility]) - Progression Note    Patient Details  Name: [name] MRN: [id number] Date of Birth: Jul 12, 1936  [facility]) CM/SW Contact  Bridget Cordella Simmonds, LCSW Phone Number: 08/09/2023, 10:03 AM  Clinical Narrative:    SNF auth approved: 1/7 - 1/13 [employer] Ref#[id number]   CSW spoke with pt, initially he stated still unsure about SNF vs home.  CSW came back 30 minutes later, pt confirmed he does want to go to SNF.  CSW confirmed with [facility] that she can receive pt today.  MD aware.   Expected Discharge Plan: Skilled Nursing Facility Barriers to Discharge: Continued Medical Work up, SNF Pending bed offer  Expected Discharge Plan and Services In-house Referral: Clinical Social Work   Post Acute Care Choice:  (TBD: SNF vs HH) Living arrangements for the past 2 months: Single Family Home                                       Social Determinants of Health (SDOH) Interventions SDOH Screenings   Food Insecurity: No Food Insecurity (08/02/2023)  Housing: Unknown (08/02/2023)  Transportation Needs: No Transportation Needs (08/02/2023)  Utilities: Not At Risk (08/02/2023)  Social Connections: Moderately Integrated (08/07/2023)  Tobacco Use: Medium Risk (08/02/2023)    Readmission Risk Interventions     No data to display

## 2023-08-09 NOTE — Plan of Care (Signed)
   Problem: Education: Goal: Knowledge of General Education information will improve Description: Including pain rating scale, medication(s)/side effects and non-pharmacologic comfort measures Outcome: Progressing   Problem: Activity: Goal: Risk for activity intolerance will decrease Outcome: Progressing   Problem: Coping: Goal: Level of anxiety will decrease Outcome: Progressing

## 2023-08-14 DIAGNOSIS — M810 Age-related osteoporosis without current pathological fracture: Secondary | ICD-10-CM | POA: Diagnosis not present

## 2023-08-14 DIAGNOSIS — S2241XD Multiple fractures of ribs, right side, subsequent encounter for fracture with routine healing: Secondary | ICD-10-CM | POA: Diagnosis not present

## 2023-08-14 DIAGNOSIS — R262 Difficulty in walking, not elsewhere classified: Secondary | ICD-10-CM | POA: Diagnosis not present

## 2023-08-14 DIAGNOSIS — I4891 Unspecified atrial fibrillation: Secondary | ICD-10-CM | POA: Diagnosis not present

## 2023-08-24 DIAGNOSIS — S3289XA Fracture of other parts of pelvis, initial encounter for closed fracture: Secondary | ICD-10-CM | POA: Diagnosis not present

## 2023-08-29 DIAGNOSIS — M25552 Pain in left hip: Secondary | ICD-10-CM | POA: Diagnosis not present

## 2023-08-29 DIAGNOSIS — I10 Essential (primary) hypertension: Secondary | ICD-10-CM | POA: Diagnosis not present

## 2023-08-29 DIAGNOSIS — E782 Mixed hyperlipidemia: Secondary | ICD-10-CM | POA: Diagnosis not present

## 2023-08-29 DIAGNOSIS — S2241XD Multiple fractures of ribs, right side, subsequent encounter for fracture with routine healing: Secondary | ICD-10-CM | POA: Diagnosis not present

## 2023-08-29 DIAGNOSIS — K862 Cyst of pancreas: Secondary | ICD-10-CM | POA: Diagnosis not present

## 2023-09-16 ENCOUNTER — Other Ambulatory Visit (HOSPITAL_COMMUNITY): Payer: Self-pay | Admitting: Family Medicine

## 2023-09-27 DIAGNOSIS — D0422 Carcinoma in situ of skin of left ear and external auricular canal: Secondary | ICD-10-CM | POA: Diagnosis not present

## 2023-09-27 DIAGNOSIS — L570 Actinic keratosis: Secondary | ICD-10-CM | POA: Diagnosis not present

## 2023-11-04 DIAGNOSIS — M25551 Pain in right hip: Secondary | ICD-10-CM | POA: Diagnosis not present

## 2023-11-04 DIAGNOSIS — M545 Low back pain, unspecified: Secondary | ICD-10-CM | POA: Diagnosis not present

## 2023-11-08 DIAGNOSIS — M545 Low back pain, unspecified: Secondary | ICD-10-CM | POA: Diagnosis not present

## 2023-11-08 DIAGNOSIS — M5416 Radiculopathy, lumbar region: Secondary | ICD-10-CM | POA: Diagnosis not present

## 2023-11-09 DIAGNOSIS — M25561 Pain in right knee: Secondary | ICD-10-CM | POA: Diagnosis not present

## 2023-11-10 DIAGNOSIS — M5416 Radiculopathy, lumbar region: Secondary | ICD-10-CM | POA: Diagnosis not present

## 2023-11-13 DIAGNOSIS — M25561 Pain in right knee: Secondary | ICD-10-CM | POA: Diagnosis not present

## 2023-11-13 DIAGNOSIS — M48062 Spinal stenosis, lumbar region with neurogenic claudication: Secondary | ICD-10-CM | POA: Diagnosis not present

## 2023-11-16 DIAGNOSIS — M170 Bilateral primary osteoarthritis of knee: Secondary | ICD-10-CM | POA: Diagnosis not present

## 2023-11-17 ENCOUNTER — Telehealth: Payer: Self-pay

## 2023-11-17 NOTE — Telephone Encounter (Signed)
 Patient with diagnosis of atrial fibrillation on Eliquis for anticoagulation.    Procedure:   RIGHT TOTAL KNEE ARTHROPLASTY   Date of Surgery:  Clearance TBD   CHA2DS2-VASc Score = 5   This indicates a 7.2% annual risk of stroke. The patient's score is based upon: CHF History: 1 HTN History: 1 Diabetes History: 0 Stroke History: 0 Vascular Disease History: 1 Age Score: 2 Gender Score: 0   Pt admitted for stroke work-up 04/29/23.  Temporary vision loss believed to be 2/2 ICA stenosis.  No indication of stroke/tia  CrCl 31 Platelet count 179  Per office protocol, patient can hold Eliquis for 3 days prior to procedure.   Patient will not need bridging with Lovenox (enoxaparin) around procedure.  **This guidance is not considered finalized until pre-operative APP has relayed final recommendations.**

## 2023-11-17 NOTE — Telephone Encounter (Signed)
..     Pre-operative Risk Assessment    Patient Name: [name]  DOB: May 30, 1936 MRN: [id number]   Date of last office visit: 03/23/23 JILL Date of next office visit: 12/22/23 [facility]   Request for Surgical Clearance    Procedure:   RIGHT TOTAL KNEE ARTHROPLASTY  Date of Surgery:  Clearance TBD                                Surgeon:  DR Marionette Sick Surgeon's Group or Practice Name:  [facility] Phone number:  [phone] Fax number:  [phone]   Type of Clearance Requested:   - Medical  - Pharmacy:  Hold Apixaban (Eliquis)     Type of Anesthesia:  Spinal AND GENERAL   Additional requests/questions:    Montel Antu   11/17/2023, 11:06 AM

## 2023-11-17 NOTE — Telephone Encounter (Signed)
   Name: [name]  DOB: 1935/10/07  MRN: [id number]  Primary Cardiologist: Eilleen Grates, MD  Chart reviewed as part of pre-operative protocol coverage. The patient has an upcoming visit scheduled with Dr. Mitzie Anda on 12/22/2023 at which time clearance can be addressed in case there are any issues that would impact surgical recommendations.  I added preop FYI to appointment note so that provider is aware to address at time of outpatient visit.  Per office protocol the cardiology provider should forward their finalized clearance decision and recommendations regarding antiplatelet therapy to the requesting party below.    This message will also be routed to pharmacy pool  for input on holding Eliquis as requested below so that this information is available to the clearing provider at time of patient's appointment.   I will route this message as FYI to requesting party and remove this message from the preop box as separate preop APP input not needed at this time.   Please call with any questions.  Francene Ing, Retha Cast, NP  11/17/2023, 11:26 AM

## 2023-11-21 ENCOUNTER — Other Ambulatory Visit (HOSPITAL_COMMUNITY): Payer: Self-pay | Admitting: Family Medicine

## 2023-12-06 DIAGNOSIS — L578 Other skin changes due to chronic exposure to nonionizing radiation: Secondary | ICD-10-CM | POA: Diagnosis not present

## 2023-12-06 DIAGNOSIS — L570 Actinic keratosis: Secondary | ICD-10-CM | POA: Diagnosis not present

## 2023-12-08 DIAGNOSIS — M48062 Spinal stenosis, lumbar region with neurogenic claudication: Secondary | ICD-10-CM | POA: Diagnosis not present

## 2023-12-09 ENCOUNTER — Other Ambulatory Visit (HOSPITAL_COMMUNITY): Payer: Self-pay

## 2023-12-09 DIAGNOSIS — Z0000 Encounter for general adult medical examination without abnormal findings: Secondary | ICD-10-CM | POA: Diagnosis not present

## 2023-12-09 DIAGNOSIS — E782 Mixed hyperlipidemia: Secondary | ICD-10-CM | POA: Diagnosis not present

## 2023-12-09 DIAGNOSIS — I5032 Chronic diastolic (congestive) heart failure: Secondary | ICD-10-CM | POA: Diagnosis not present

## 2023-12-09 DIAGNOSIS — F419 Anxiety disorder, unspecified: Secondary | ICD-10-CM | POA: Diagnosis not present

## 2023-12-09 DIAGNOSIS — E039 Hypothyroidism, unspecified: Secondary | ICD-10-CM | POA: Diagnosis not present

## 2023-12-09 DIAGNOSIS — R7303 Prediabetes: Secondary | ICD-10-CM | POA: Diagnosis not present

## 2023-12-09 DIAGNOSIS — K219 Gastro-esophageal reflux disease without esophagitis: Secondary | ICD-10-CM | POA: Diagnosis not present

## 2023-12-09 DIAGNOSIS — N183 Chronic kidney disease, stage 3 unspecified: Secondary | ICD-10-CM | POA: Diagnosis not present

## 2023-12-09 DIAGNOSIS — I10 Essential (primary) hypertension: Secondary | ICD-10-CM | POA: Diagnosis not present

## 2023-12-09 DIAGNOSIS — I340 Nonrheumatic mitral (valve) insufficiency: Secondary | ICD-10-CM

## 2023-12-09 DIAGNOSIS — D6869 Other thrombophilia: Secondary | ICD-10-CM | POA: Diagnosis not present

## 2023-12-09 DIAGNOSIS — I2510 Atherosclerotic heart disease of native coronary artery without angina pectoris: Secondary | ICD-10-CM | POA: Diagnosis not present

## 2023-12-09 DIAGNOSIS — D509 Iron deficiency anemia, unspecified: Secondary | ICD-10-CM | POA: Diagnosis not present

## 2023-12-13 ENCOUNTER — Telehealth: Payer: Self-pay | Admitting: Neurology

## 2023-12-13 NOTE — Telephone Encounter (Signed)
 Pt daughter(Michelle) call to confirm appt Details and be added to Wait list.  Added to wait list, Appt details confirm.

## 2023-12-14 ENCOUNTER — Other Ambulatory Visit (HOSPITAL_COMMUNITY): Payer: Self-pay

## 2023-12-14 DIAGNOSIS — I340 Nonrheumatic mitral (valve) insufficiency: Secondary | ICD-10-CM

## 2023-12-14 MED ORDER — FUROSEMIDE 20 MG PO TABS: 20.0000 mg | ORAL_TABLET | ORAL | 2 refills | Status: DC

## 2023-12-21 DIAGNOSIS — M1711 Unilateral primary osteoarthritis, right knee: Secondary | ICD-10-CM | POA: Diagnosis not present

## 2023-12-21 DIAGNOSIS — M25561 Pain in right knee: Secondary | ICD-10-CM | POA: Diagnosis not present

## 2023-12-22 ENCOUNTER — Ambulatory Visit (HOSPITAL_COMMUNITY)
Admission: RE | Admit: 2023-12-22 | Discharge: 2023-12-22 | Disposition: A | Discharge status: Home / Self Care | Source: Ambulatory Visit | Attending: Cardiology | Admitting: Cardiology

## 2023-12-22 ENCOUNTER — Encounter (HOSPITAL_COMMUNITY): Payer: Self-pay | Admitting: Cardiology

## 2023-12-22 ENCOUNTER — Ambulatory Visit (HOSPITAL_COMMUNITY): Payer: Self-pay | Admitting: Cardiology

## 2023-12-22 VITALS — BP 120/68 | HR 61 | Wt 164.0 lb

## 2023-12-22 DIAGNOSIS — Z7901 Long term (current) use of anticoagulants: Secondary | ICD-10-CM | POA: Insufficient documentation

## 2023-12-22 DIAGNOSIS — Z79899 Other long term (current) drug therapy: Secondary | ICD-10-CM | POA: Insufficient documentation

## 2023-12-22 DIAGNOSIS — I110 Hypertensive heart disease with heart failure: Secondary | ICD-10-CM | POA: Diagnosis not present

## 2023-12-22 DIAGNOSIS — I341 Nonrheumatic mitral (valve) prolapse: Secondary | ICD-10-CM | POA: Diagnosis not present

## 2023-12-22 DIAGNOSIS — Z8249 Family history of ischemic heart disease and other diseases of the circulatory system: Secondary | ICD-10-CM | POA: Diagnosis not present

## 2023-12-22 DIAGNOSIS — M25561 Pain in right knee: Secondary | ICD-10-CM | POA: Insufficient documentation

## 2023-12-22 DIAGNOSIS — Z8616 Personal history of COVID-19: Secondary | ICD-10-CM | POA: Diagnosis not present

## 2023-12-22 DIAGNOSIS — I252 Old myocardial infarction: Secondary | ICD-10-CM | POA: Diagnosis not present

## 2023-12-22 DIAGNOSIS — I340 Nonrheumatic mitral (valve) insufficiency: Secondary | ICD-10-CM | POA: Insufficient documentation

## 2023-12-22 DIAGNOSIS — I4819 Other persistent atrial fibrillation: Secondary | ICD-10-CM | POA: Insufficient documentation

## 2023-12-22 DIAGNOSIS — I4891 Unspecified atrial fibrillation: Secondary | ICD-10-CM

## 2023-12-22 DIAGNOSIS — I5032 Chronic diastolic (congestive) heart failure: Secondary | ICD-10-CM | POA: Insufficient documentation

## 2023-12-22 DIAGNOSIS — I2510 Atherosclerotic heart disease of native coronary artery without angina pectoris: Secondary | ICD-10-CM | POA: Diagnosis not present

## 2023-12-22 LAB — LIPID PANEL
Cholesterol: 114 mg/dL (ref 0–200)
HDL: 46 mg/dL (ref >40)
LDL Cholesterol: 56 mg/dL (ref 0–99)
Total CHOL/HDL Ratio: 2.5 ratio
Triglycerides: 62 mg/dL (ref <150)
VLDL: 12 mg/dL (ref 0–40)

## 2023-12-22 LAB — COMPREHENSIVE METABOLIC PANEL WITH GFR
ALT: 12 U/L (ref 0–44)
AST: 15 U/L (ref 15–41)
Albumin: 3.1 g/dL — ABNORMAL LOW (ref 3.5–5.0)
Alkaline Phosphatase: 54 U/L (ref 38–126)
Anion gap: 7 (ref 5–15)
BUN: 22 mg/dL (ref 8–23)
CO2: 31 mmol/L (ref 22–32)
Calcium: 9.2 mg/dL (ref 8.9–10.3)
Chloride: 103 mmol/L (ref 98–111)
Creatinine, Ser: 1.78 mg/dL — ABNORMAL HIGH (ref 0.61–1.24)
GFR, Estimated: 37 mL/min — ABNORMAL LOW (ref >60)
Glucose, Bld: 114 mg/dL — ABNORMAL HIGH (ref 70–99)
Potassium: 4.6 mmol/L (ref 3.5–5.1)
Sodium: 141 mmol/L (ref 135–145)
Total Bilirubin: 0.4 mg/dL (ref 0.0–1.2)
Total Protein: 5.7 g/dL — ABNORMAL LOW (ref 6.5–8.1)

## 2023-12-22 LAB — TSH: TSH: 6.327 u[IU]/mL — ABNORMAL HIGH (ref 0.350–4.500)

## 2023-12-22 LAB — BRAIN NATRIURETIC PEPTIDE: B Natriuretic Peptide: 305.2 pg/mL — ABNORMAL HIGH (ref 0.0–100.0)

## 2023-12-22 NOTE — Patient Instructions (Signed)
 There has been no changes to your medications.  Labs done today, your results will be available in MyChart, we will contact you for abnormal readings.  Your physician has requested that you have an echocardiogram. Echocardiography is a painless test that uses sound waves to create images of your heart. It provides your doctor with information about the size and shape of your heart and how well your heart's chambers and valves are working. This procedure takes approximately one hour. There are no restrictions for this procedure. Please do NOT wear cologne, perfume, aftershave, or lotions (deodorant is allowed). Please arrive 15 minutes prior to your appointment time.  Please note: We ask at that you not bring children with you during ultrasound (echo/ vascular) testing. Due to room size and safety concerns, children are not allowed in the ultrasound rooms during exams. Our front office staff cannot provide observation of children in our lobby area while testing is being conducted. An adult accompanying a patient to their appointment will only be allowed in the ultrasound room at the discretion of the ultrasound technician under special circumstances. We apologize for any inconvenience.  Your physician recommends that you schedule a follow-up appointment in: 4 months with an echocardiogram   If you have any questions or concerns before your next appointment please send us  a message through [place] or call our office at [phone].    TO LEAVE A MESSAGE FOR THE NURSE SELECT OPTION 2, PLEASE LEAVE A MESSAGE INCLUDING: YOUR NAME DATE OF BIRTH CALL BACK NUMBER REASON FOR CALL**this is important as we prioritize the call backs  YOU WILL RECEIVE A CALL BACK THE SAME DAY AS LONG AS YOU CALL BEFORE 4:00 PM  At the [facility], you and your health needs are our priority. As part of our continuing mission to provide you with exceptional heart care, we have created designated Provider  Care Teams. These Care Teams include your primary Cardiologist (physician) and Advanced Practice Providers (APPs- Physician Assistants and Nurse Practitioners) who all work together to provide you with the care you need, when you need it.   You may see any of the following providers on your designated Care Team at your next follow up: Dr Jules Oar Dr Peder Bourdon Dr. Alwin Baars Dr. Arta Lark Amy Marijane Shoulders, NP Ruddy Corral, [place] [facility], [place] Dennise Fitz, NP [place] Lee, NP Shawnee Dellen, NP Luster Salters, PharmD Bevely Brush, PharmD   Please be sure to bring in all your medications bottles to every appointment.    Thank you for choosing [facility]

## 2023-12-22 NOTE — Progress Notes (Signed)
 PCP: Roselind Congo, MD Cardiology: Dr. Lavonne Prairie HF Cardiology: Dr. Mitzie Anda  Chief complaint: CHF  88 y.o. with history of persistent atrial fibrillation and mitral regurgitation was referred by Dr. Lorie Rook for CHF evaluation prior to Mitraclip.  Patient has history of CAD with MI and POBA to LCx and LAD in 2001 as well as BMS to LCx in 2003.  He reported exertional dyspnea beginning last year that he attributed to the after-effects of COVID-19 x 2 episodes.  He saw Dr. Lavonne Prairie in 4/23 as a pre-op for lithotripsy and was noted to be in atrial fibrillation (first time that AF had been recorded).  Echo was then done, showing preserved LV EF but severe MR.  TEE in 5/23 showed EF 60-65%, mildly decreased RV systolic function, severe LAE, mild bileaflet myxomatous MV prolapse with severe MR (central).  Cath in 6/23 showed mild nonobstructive coronary disease.    Patient had Mitraclip x 2 in 9/23 with followup echo showing EF 50-55%, normal RV, 2 Mitraclips with moderate MR, mean gradient 5, mild-moderate TR.   Patient had DCCV back to NSR in 9/23.    Echo in 3/24 showed EF 55-60%, severe LAE, Mitraclip x 2 with mild-moderate MR and mean gradient 3 mmHg, IVC normal.   Echo (8/24) EF 55-60%, normal RV, stable MitraClip with mild MR  Admitted 9/24 with symptoms concerning for CVA. MRI brain without acute abnormality. Found to have 60% narrowing of RICA. Vascular consulted and recommending carotid revascularization. Started on triple therapy. In 10/24, patient had right TCAR by Dr. Rosalva Comber.   Patient returns for followup of CHF.  He continues to do well post-Mitraclip.  Main complaint is right knee pain, considering surgery with Dr. Brunilda Capra. Not exercising much with knee pain but no dyspnea with his usual activities.  No chest pain.  No orthopnea/PND. Weight down 3 lbs.   ECG (personally reviewed): NSR, normal  Labs (6/23): K 4, creatinine 1.42 Labs (9/23): K 4.3, creatinine 2 Labs (10/23): K 3.7,  creatinine 1.95, normal LFTs and TSH. Labs (4/24): K 3.7, creatinine 1.83, BNP 356 Labs (9/24): K 4.4, creatinine 1.91, LDL 66 Labs (1/25): K 4.5, creatinine 1.9, hgb 12.7, LFTs normal  PMH: 1. CAD: MI in 2001 with POBA to CFX and LAD.  - LHC 2003 with BMS to LCx. - LHC (6/23): minimal CAD, patent LCx stent.  2. Mitral regurgitation: TEE (5/23) with EF 60-65%, mildly decreased RV systolic function, severe LAE, mild bileaflet myxomatous MV prolapse with severe MR (central).  - RHC (5/23): mean 5, PA 38/10 mean 23, mean PCWP 17 with v waves to 34, CI 3.5. - Mitraclip x 2 in 8/23.  - Echo (9/23): EF 50-55%, normal RV, 2 Mitraclips with moderate MR, mean gradient 5, mild-moderate TR.  - Echo (3/24): EF 55-60%, severe LAE, Mitraclip x 2 with mild-moderate MR and mean gradient 3 mmHg, IVC normal.  - Echo (8/24) EF 55-60%, normal RV, stable MitraClip with mild MR   3. Atrial fibrillation: Persistent since at least 4/23.  - DCCV 9/23 4. HTN 5. Hyperlipidemia 6. Hypothyroidism 7. COVID-19 x 2 8. Nephrolithiasis 9. CKD stage 3 10. R ICA stenosis - Right TCAR 10/24, [place].   Social History   Socioeconomic History   Marital status: Married    Spouse name: Not on file   Number of children: 2   Years of education: Not on file   Highest education level: Not on file  Occupational History   Occupation: retired  Tobacco Use  Smoking status: Former    Current packs/day: 0.00    Average packs/day: 1 pack/day for 3.0 years (3.0 ttl pk-yrs)    Types: Cigarettes    Start date: 09/27/1959    Quit date: 09/26/1962    Years since quitting: 61.2   Smokeless tobacco: Never  Vaping Use   Vaping status: Never Used  Substance and Sexual Activity   Alcohol use: No   Drug use: No   Sexual activity: Not Currently  Other Topics Concern   Not on file  Social History Narrative   Lives at home with wife.  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     Social Drivers of Corporate investment banker Strain: Not on file   Food Insecurity: No Food Insecurity (08/02/2023)   Hunger Vital Sign    Worried About Running Out of Food in the Last Year: Never true    Ran Out of Food in the Last Year: Never true  Transportation Needs: No Transportation Needs (08/02/2023)   PRAPARE - Administrator, Civil Service (Medical): No    Lack of Transportation (Non-Medical): No  Physical Activity: Not on file  Stress: Not on file  Social Connections: Moderately Integrated (08/07/2023)   Social Connection and Isolation Panel [NHANES]    Frequency of Communication with Friends and Family: Three times a week    Frequency of Social Gatherings with Friends and Family: Twice a week    Attends Religious Services: More than 4 times per year    Active Member of [employer] or Organizations: Yes    Attends Banker Meetings: 1 to 4 times per year    Marital Status: Widowed  Intimate Partner Violence: Not At Risk (08/02/2023)   Humiliation, Afraid, Rape, and Kick questionnaire    Fear of Current or Ex-Partner: No    Emotionally Abused: No    Physically Abused: No    Sexually Abused: No   Family History  Problem Relation Age of Onset   Heart failure Mother    Prostate cancer Father    ROS: All systems reviewed and negative except as per HPI.   Current Outpatient Medications  Medication Sig Dispense Refill   acetaminophen  (TYLENOL ) 500 MG tablet Take 500 mg by mouth every 6 (six) hours as needed for moderate pain.     amiodarone  (PACERONE ) 200 MG tablet Take 1 tablet (200 mg total) by mouth daily. 90 tablet 3   amLODipine  (NORVASC ) 2.5 MG tablet Take 1 tablet by mouth once daily. **NEEDS FOLLOW UP APPOINTMENT FOR MORE REFILLS** 30 tablet 1   apixaban  (ELIQUIS ) 5 MG TABS tablet Take 1/2 tablet by mouth twice daily 90 tablet 1   aspirin  EC 81 MG tablet Take 1 tablet (81 mg total) by mouth daily. Swallow whole.     atorvastatin  (LIPITOR ) 80 MG tablet Take 1 tablet (80 mg total) by mouth at bedtime. 90 tablet 3    Cholecalciferol (VITAMIN D) 50 MCG (2000 UT) tablet Take 2,000 Units by mouth daily.     Cranberry 500 MG TABS Take 1 tablet by mouth at bedtime.     Cyanocobalamin (B-12) 5000 MCG CAPS Take 5,000 mcg by mouth daily.     dapagliflozin  propanediol (FARXIGA ) 10 MG TABS tablet Take 1 tablet (10 mg total) by mouth daily before breakfast. 90 tablet 3   ezetimibe  (ZETIA ) 10 MG tablet Take 10 mg by mouth daily.     ferrous sulfate  325 (65 FE) MG tablet Take 325 mg by mouth daily with  breakfast.     finasteride  (PROSCAR ) 5 MG tablet Take 5 mg by mouth daily.     folic acid  (FOLVITE ) 800 MCG tablet Take 800 mcg by mouth daily.      furosemide  (LASIX ) 20 MG tablet Take 20 mg by mouth 3 (three) times a week.     HYDROcodone -acetaminophen  (NORCO) 10-325 MG tablet Take 1 tablet by mouth as needed.     hydrOXYzine (ATARAX) 10 MG tablet Take 10 mg by mouth as needed.     levothyroxine  (SYNTHROID ) 50 MCG tablet Take 50 mcg by mouth every morning.     meclizine (ANTIVERT) 25 MG tablet Take 25 mg by mouth daily as needed for dizziness.     niacin  500 MG tablet Take 500 mg by mouth at bedtime.     nitroGLYCERIN  (NITROSTAT ) 0.4 MG SL tablet Place 1 tablet (0.4 mg total) under the tongue every 5 (five) minutes as needed for chest pain. 25 tablet 3   pantoprazole  (PROTONIX ) 40 MG tablet TAKE ONE TABLET BY MOUTH EVERY DAY 90 tablet 3   polyethylene glycol (MIRALAX  / GLYCOLAX ) 17 g packet Take 17 g by mouth daily as needed for mild constipation.     traMADol  (ULTRAM ) 50 MG tablet Take 1 tablet (50 mg total) by mouth every 6 (six) hours as needed for moderate pain (pain score 4-6). 20 tablet 0   No current facility-administered medications for this encounter.   Wt Readings from Last 3 Encounters:  12/22/23 74.4 kg (164 lb)  08/05/23 80.4 kg (177 lb 4 oz)  07/28/23 76.7 kg (169 lb)   BP 120/68   Pulse 61   Wt 74.4 kg (164 lb)   SpO2 96%   BMI 24.22 kg/m  General: NAD Neck: No JVD, no thyromegaly or thyroid   nodule.  Lungs: Mild crackles at bases CV: Nondisplaced PMI.  Heart regular S1/S2, no S3/S4, 2/6 early SEM RUSB.  Trace ankle edema.  No carotid bruit.  Normal pedal pulses.  Abdomen: Soft, nontender, no hepatosplenomegaly, no distention.  Skin: Intact without lesions or rashes.  Neurologic: Alert and oriented x 3.  Psych: Normal affect. Extremities: No clubbing or cyanosis.  HEENT: Normal.   Assessment/Plan: 1. Mitral regurgitation: Suspect mixed picture of primary and functional MR, with myxomatous MV prolapse as well as a component of atrial functional MR with central jet and enlarged left atrium. S/p mTEER in 8/23.  Post-mTEER echo in 9/23 showed MV repair with 2 Mitraclips with moderate MR and mean gradient 5 mmHg.  Echo in 8/24 showed mild MR s/p Mitraclip.  -  I will arrange for repeat echo in 8/25.  2. Atrial fibrillation: S/p DCCV in 9/23. NSR today.  - Continue amiodarone  200 mg daily.  Check LFTs and TSH. He will need regular eye exam.    - Continue apixaban .   3. Chronic diastolic CHF: Echo in 3/24 showed EF 55-60%, severe LAE, Mitraclip x 2 with mild-moderate MR and mean gradient 3 mmHg, IVC normal. Echo in 8/24 showed EF 55-60%, normal RV, stable MitraClip with mild MR  NYHA class II, not volume overloaded on exam.  - Continue Farxiga  10 mg daily.  - Continue Lasix  20 mg every other day. BMET today. 4. HTN: BP elevated today, but better controlled on home readings. - Continue current dose of amlodipine . - Continue to check BP at home and log. If sBP consistently > 150, notify clinic. 5. Carotid stenosis: s/p right TCAR 10/24. - Continue statin, check lipids.  - On Eliquis   and ASA, stop ASA when ok with Dr. Crosby Dooms.  6. Pre-operative risk assessment: Patient is considering right TKR.  He is > 6 months out past carotid stenting.  I think that he could undergo surgery with moderate cardiac risk. He would have to hold Eliquis .   Follow up in 6 months with APP.   I spent  31 minutes reviewing records, interviewing/examining patient, and managing orders.     Peder Bourdon 12/22/2023

## 2023-12-28 DIAGNOSIS — M1711 Unilateral primary osteoarthritis, right knee: Secondary | ICD-10-CM | POA: Diagnosis not present

## 2024-01-03 DIAGNOSIS — M5459 Other low back pain: Secondary | ICD-10-CM | POA: Diagnosis not present

## 2024-01-03 DIAGNOSIS — G8929 Other chronic pain: Secondary | ICD-10-CM | POA: Diagnosis not present

## 2024-01-04 DIAGNOSIS — M1711 Unilateral primary osteoarthritis, right knee: Secondary | ICD-10-CM | POA: Diagnosis not present

## 2024-01-05 DIAGNOSIS — M5459 Other low back pain: Secondary | ICD-10-CM | POA: Diagnosis not present

## 2024-01-05 DIAGNOSIS — G8929 Other chronic pain: Secondary | ICD-10-CM | POA: Diagnosis not present

## 2024-01-09 ENCOUNTER — Telehealth: Payer: Self-pay

## 2024-01-09 NOTE — Telephone Encounter (Signed)
   Pre-operative Risk Assessment    Patient Name: [name]  DOB: [birth year] MRN: [id number]   Date of last office visit: 03/23/23 with Audrene Lease, NP Date of next office visit: None   Request for Surgical Clearance    Procedure:  Right total knee arthroplasty   Date of Surgery:  Clearance TBD                                Surgeon:  Dr. Marionette Sick Surgeon's Group or Practice Name:  [facility] Phone number:  [phone] attn: Enrigue Harvard  Fax number:  [phone]    Type of Clearance Requested:   - Medical  - Pharmacy:  Hold Aspirin  and Apixaban  (Eliquis ) not indicated   Type of Anesthesia:  General and Spinal   Additional requests/questions:    Kenny Peals   01/09/2024, 4:26 PM

## 2024-01-12 ENCOUNTER — Other Ambulatory Visit (HOSPITAL_COMMUNITY): Payer: Self-pay | Admitting: Family Medicine

## 2024-01-12 DIAGNOSIS — G8929 Other chronic pain: Secondary | ICD-10-CM | POA: Diagnosis not present

## 2024-01-12 DIAGNOSIS — M5459 Other low back pain: Secondary | ICD-10-CM | POA: Diagnosis not present

## 2024-01-12 MED ORDER — AMIODARONE HCL 200 MG PO TABS: 200.0000 mg | ORAL_TABLET | Freq: Every day | ORAL | 3 refills | Status: AC

## 2024-01-12 MED ORDER — EZETIMIBE 10 MG PO TABS: 10.0000 mg | ORAL_TABLET | Freq: Every day | ORAL | 3 refills | Status: AC

## 2024-01-12 NOTE — Telephone Encounter (Signed)
   Primary Cardiologist: Eilleen Grates, MD  Chart reviewed as part of pre-operative protocol coverage. Given past medical history and time since last visit, based on ACC/AHA guidelines, OLLIVANDER SEE would be at acceptable risk for the planned procedure without further cardiovascular testing.   Patient was advised that if he develops new symptoms prior to surgery to contact our office to arrange a follow-up appointment.  He verbalized understanding.  Aspirin  is managed by vascular surgery.  Per office protocol, patient can hold Eliquis  for 3 days prior to procedure.    I will route this recommendation to the requesting party via Epic fax function and remove from pre-op pool.  Please call with questions.  Gerldine Koch, NP-C  01/12/2024, 5:07 PM [address] [place], [place] [address] Office [phone] Fax [phone]

## 2024-01-12 NOTE — Telephone Encounter (Signed)
 Patient with diagnosis of afib on Eliquis  for anticoagulation.    Procedure: Right total knee arthroplasty  Date of procedure: TBD   CHA2DS2-VASc Score = 5   This indicates a 7.2% annual risk of stroke. The patient's score is based upon: CHF History: 1 HTN History: 1 Diabetes History: 0 Stroke History: 0 Vascular Disease History: 1 Age Score: 2 Gender Score: 0      CrCl 31.9 ml/min Platelet count 179  Patient has not had an Afib/aflutter ablation within the last 3 months or DCCV within the last 30 days  Pt admitted for stroke work-up 04/29/23. Temporary vision loss believed to be 2/2 ICA stenosis. No indication of stroke/tia   Per office protocol, patient can hold Eliquis  for 3 days prior to procedure.    **This guidance is not considered finalized until pre-operative APP has relayed final recommendations.**

## 2024-01-19 DIAGNOSIS — M5459 Other low back pain: Secondary | ICD-10-CM | POA: Diagnosis not present

## 2024-01-19 DIAGNOSIS — G8929 Other chronic pain: Secondary | ICD-10-CM | POA: Diagnosis not present

## 2024-02-02 DIAGNOSIS — M5459 Other low back pain: Secondary | ICD-10-CM | POA: Diagnosis not present

## 2024-02-02 DIAGNOSIS — G8929 Other chronic pain: Secondary | ICD-10-CM | POA: Diagnosis not present

## 2024-02-07 DIAGNOSIS — M48062 Spinal stenosis, lumbar region with neurogenic claudication: Secondary | ICD-10-CM | POA: Diagnosis not present

## 2024-02-09 DIAGNOSIS — M5459 Other low back pain: Secondary | ICD-10-CM | POA: Diagnosis not present

## 2024-02-09 DIAGNOSIS — G8929 Other chronic pain: Secondary | ICD-10-CM | POA: Diagnosis not present

## 2024-02-16 DIAGNOSIS — G8929 Other chronic pain: Secondary | ICD-10-CM | POA: Diagnosis not present

## 2024-02-16 DIAGNOSIS — M5459 Other low back pain: Secondary | ICD-10-CM | POA: Diagnosis not present

## 2024-02-21 ENCOUNTER — Encounter: Payer: Self-pay | Admitting: Neurology

## 2024-02-21 ENCOUNTER — Ambulatory Visit: Admitting: Neurology

## 2024-02-21 VITALS — BP 121/70 | HR 66 | Ht 69.0 in | Wt 167.0 lb

## 2024-02-21 DIAGNOSIS — R251 Tremor, unspecified: Secondary | ICD-10-CM

## 2024-02-21 DIAGNOSIS — G459 Transient cerebral ischemic attack, unspecified: Secondary | ICD-10-CM | POA: Diagnosis not present

## 2024-02-21 NOTE — Progress Notes (Signed)
 Chief Complaint  Patient presents with   Hospitalization Follow-up    Rm14, daughter present, Hospital f/u for CVA and R hand tremor/Saw Dr. Jerri: pt stated that he has gait issues due to bad right knee. Doing well since cva. Pt mentioned r hand tremor, intermittent, worse at times       ASSESSMENT AND PLAN  [name] is a 88 y.o. male    Right amaurosis fugax due to symptomatic right carotid artery disease with right M4 calcified plaque, 60% stenosis of proximal right internal carotid artery at cervical region  Status post right internal carotid artery stent May 09, 2023 by Dr. Fonda Rim vascular surgeon  Vascular risk factor of aging, atrial fibrillation, hypertension, on Eliquis , and aspirin  81 mg daily  Bilateral hands tremor gait abnormality,  He has mild bilateral fairly symmetric rigidity, bradykinesia, mild gait change can be attributed to his significant right knee pain, candidate for right knee replacement, also history of right shoulder replacement,  Discussed option, including [facility] for evaluation of possible parkinsonian pathology, he decided to hold off, encouraged him moderate exercise  Return to clinic for worsening symptoms   DIAGNOSTIC DATA (LABS, IMAGING, TESTING) - I reviewed patient records, labs, notes, testing and imaging myself where available.   MEDICAL HISTORY:  [name] is a 88 year old male, seen in request by his primary care from [facility] Dr. Arloa, Elsie SAUNDERS, for evaluation of transient visual difficulty, initial evaluation February 21, 2024.  History is obtained from the patient and review of electronic medical records. I personally reviewed pertinent available imaging films in PACS.   PMHx of  HTN DM Hypothryodism CAD A fib, on eliquis  S/p mitral valve clip implantation due to severe mitral regurgitation. Left knee replacement  He had a history of right shoulder replacement, did help his right shoulder pain, since then, he  began to notice mild right hand tremor, slightly worse over the past many years, he shakes when he holds a glass of water , but there is no major limitation in his daily activity, occasionally left hand tremor  He has mild gait abnormality, he contributed to his right knee pain, had a history of left knee replacement, is a candidate for right knee replacement  He denies visual change, denied loss sense of smell, REM sleep disorder,  He was admitted to the hospital in September 2024 woke up to a phone call from his daughter on April 29, 2023 8:30 AM, noticed a right eye vision was blurred, stating that way for about 10 minutes then resolved, few hours prior to that, he used the bathroom in the middle of the night his vision was okay then.  In addition, he has transient bilateral arm numbness tingling, that was resolved,  He had extensive evaluation for possible right amaurosis fugax, CT head showed old infarction at the right caudate nucleus, CT angiogram head and neck showed no occlusive calcified thrombus along the anterior M4 branch of the right MCA, dense calcified plaque with 60% gnosis of proximal cervical ICA, MRI of the brain showed no acute intracranial abnormality, echocardiogram was unremarkable, echocardiogram showed normal ejection fraction 55 to  60%, A1c was 6.1, LDL was 66  He was Eliquis  aspirin  81 mg daily, was treated with Asa 81mg  +plavix  75mg  transiently.  He had right  IC stent by vascular surgeon Dr. Rim on Oct 7th,2024, was on triple therapy Eliquis , asa, plavix  for one month, then plavix  was discontinued, he is doing well, no recurrent symptoms.  PHYSICAL EXAM:   Vitals:   02/21/24 1528  BP: 121/70  Pulse: 66  Weight: 167 lb (75.8 kg)  Height: 5' 9 (1.753 m)   Not recorded     Body mass index is 24.66 kg/m.  PHYSICAL EXAMNIATION:  Gen: NAD, conversant, well nourised, well groomed                     Cardiovascular: Regular rate rhythm, no peripheral  edema, warm, nontender. Eyes: Conjunctivae clear without exudates or hemorrhage Neck: Supple, no carotid bruits. Pulmonary: Clear to auscultation bilaterally   NEUROLOGICAL EXAM:  MENTAL STATUS: Speech/cognition: Awake, alert, oriented to history taking and casual conversation CRANIAL NERVES: CN II: Visual fields are full to confrontation. Pupils are round equal and briskly reactive to light. CN III, IV, VI: extraocular movement are normal. No ptosis. CN V: Facial sensation is intact to light touch CN VII: Face is symmetric with normal eye closure  CN VIII: Hearing is normal to causal conversation. CN IX, X: Phonation is normal. CN XI: Head turning and shoulder shrug are intact  MOTOR: Limited range of motion of right shoulder, no weakness, mild bilateral rigidity, bradykinesia, mild bilateral action tremor  REFLEXES: Reflexes are 1 and symmetric at the biceps, triceps, knees, and ankles. Plantar responses are flexor.  SENSORY: Intact to light touch, pinprick.  COORDINATION: There is no trunk or limb dysmetria noted.  GAIT/STANCE: Push-up, mildly antalgic, dragging right knee, decreased right arm swing, leaning forward  REVIEW OF SYSTEMS:  Full 14 system review of systems performed and notable only for as above All other review of systems were negative.   ALLERGIES: No Known Allergies  HOME MEDICATIONS: Current Outpatient Medications  Medication Sig Dispense Refill   acetaminophen  (TYLENOL ) 500 MG tablet Take 500 mg by mouth every 6 (six) hours as needed for moderate pain.     amiodarone  (PACERONE ) 200 MG tablet Take 1 tablet (200 mg total) by mouth daily. 90 tablet 3   amLODipine  (NORVASC ) 2.5 MG tablet Take 1 tablet (2.5 mg total) by mouth daily. 90 tablet 3   apixaban  (ELIQUIS ) 5 MG TABS tablet Take 1/2 tablet by mouth twice daily 90 tablet 1   aspirin  EC 81 MG tablet Take 1 tablet (81 mg total) by mouth daily. Swallow whole.     atorvastatin  (LIPITOR ) 80 MG tablet  Take 1 tablet (80 mg total) by mouth at bedtime. 90 tablet 3   Cholecalciferol (VITAMIN D) 50 MCG (2000 UT) tablet Take 2,000 Units by mouth daily.     Cyanocobalamin (B-12) 5000 MCG CAPS Take 5,000 mcg by mouth daily.     dapagliflozin  propanediol (FARXIGA ) 10 MG TABS tablet Take 1 tablet (10 mg total) by mouth daily before breakfast. 90 tablet 3   ezetimibe  (ZETIA ) 10 MG tablet Take 1 tablet (10 mg total) by mouth daily. 90 tablet 3   ferrous sulfate  325 (65 FE) MG tablet Take 325 mg by mouth daily with breakfast.     folic acid  (FOLVITE ) 800 MCG tablet Take 800 mcg by mouth daily.      furosemide  (LASIX ) 20 MG tablet Take 20 mg by mouth 3 (three) times a week.     HYDROcodone -acetaminophen  (NORCO) 10-325 MG tablet Take 1 tablet by mouth as needed.     hydrOXYzine (ATARAX) 10 MG tablet Take 10 mg by mouth as needed.     levothyroxine  (SYNTHROID ) 50 MCG tablet Take 50 mcg by mouth every morning.     niacin  500 MG  tablet Take 500 mg by mouth at bedtime.     nitroGLYCERIN  (NITROSTAT ) 0.4 MG SL tablet Place 1 tablet (0.4 mg total) under the tongue every 5 (five) minutes as needed for chest pain. 25 tablet 3   pantoprazole  (PROTONIX ) 40 MG tablet TAKE ONE TABLET BY MOUTH EVERY DAY 90 tablet 3   polyethylene glycol (MIRALAX  / GLYCOLAX ) 17 g packet Take 17 g by mouth daily as needed for mild constipation.     traMADol  (ULTRAM ) 50 MG tablet Take 1 tablet (50 mg total) by mouth every 6 (six) hours as needed for moderate pain (pain score 4-6). 20 tablet 0   finasteride  (PROSCAR ) 5 MG tablet Take 5 mg by mouth daily.     No current facility-administered medications for this visit.    PAST MEDICAL HISTORY: Past Medical History:  Diagnosis Date   Arthritis    knees,back   Atrial fibrillation (HCC)    CAD (coronary artery disease) CARDIOLOGIST-  DR HOCHREIN   previous myocardial infarction in 2001 with 90% circumflex lesion treated with PTCA. The LAD had 70-80% stenosis which was treawted with  angioplasty. Most recent catheterization in 2003 demonstrated the LAD at 30% mid stenosis, circumflex 60-70% stenosis in the mid AV grove, and 40% stenosis in the previously stented area. The right coronary artery had 30-40% stenosis in the mid portion.    Chronic diastolic (congestive) heart failure (HCC)    CKD (chronic kidney disease) stage 3, GFR 30-59 ml/min (HCC)    GERD (gastroesophageal reflux disease)    History of kidney stones    Hypertension    Hypothyroidism    Left ureteral stone    Pre-diabetes    S/P mitral valve clip implantation 03/25/2022   s/p TEER wtih two XTW MitraClips by Dr. Wonda and Wendel   Severe mitral regurgitation    Skin cancer    face    PAST SURGICAL HISTORY: Past Surgical History:  Procedure Laterality Date   APPENDECTOMY  as teen   BUBBLE STUDY  12/11/2021   Procedure: BUBBLE STUDY;  Surgeon: Jack Soyla LABOR, MD;  Location: [facility] ENDOSCOPY;  Service: Cardiology;;   CARDIAC CATHETERIZATION  03-29-2000   dr hochrein   Nonobstuctive CAD /   CFX patent stent and LAD diffuse luminal irregularities including mid segment previously been angioplastied   CARDIOVASCULAR STRESS TEST  05-09-2015   dr hochrein   low risk perfusion study/  no reversible ischemia/  normal LV function and wall motion , ef 63%   CARDIOVERSION N/A 04/16/2022   Procedure: CARDIOVERSION;  Surgeon: Barbaraann Darryle Ned, MD;  Location: [facility] ENDOSCOPY;  Service: Cardiovascular;  Laterality: N/A;   CATARACT EXTRACTION W/ INTRAOCULAR LENS  IMPLANT, BILATERAL  left 05-06-2015/  right 06-10-2015   CORONARY ANGIOPLASTY WITH STENT PLACEMENT  01-21-2000  dr hochrein   PTCA to pLAD/  PTCA and BMS x1 to mid LCFX/  normal LVSF   CORONARY ANGIOPLASTY WITH STENT PLACEMENT  01-10-2002  dr charlanne   PTCA and BMS to mid AV LCFX (in-stent restenosis)/  diffuse 30-40% mRCA ,  mLAD diffuse 30%,  normal LVSF, ef 65%   CYSTOSCOPY/RETROGRADE/URETEROSCOPY/STONE EXTRACTION WITH BASKET Left 05/23/2015    Procedure: CYSTOSCOPY, FULGERATION OF PROSTATE,  LEFT URETEROSCOPY, JJ STENT PLACEMENT;  Surgeon: Donnice Brooks, MD;  Location: [facility];  Service: Urology;  Laterality: Left;   ELECTROPHYSIOLOGIC STUDY  03-30-2000   dr fernande   EXTRACORPOREAL SHOCK WAVE LITHOTRIPSY Left 10-30-2012   EXTRACORPOREAL SHOCK WAVE LITHOTRIPSY Right 11/16/2021  Procedure: EXTRACORPOREAL SHOCK WAVE LITHOTRIPSY (ESWL);  Surgeon: Matilda Senior, MD;  Location: [facility];  Service: Urology;  Laterality: Right;   HOLMIUM LASER APPLICATION Left 05/23/2015   Procedure: WITH HOLMIUM LASER LITHOTRIPSY;  Surgeon: Donnice Brooks, MD;  Location: [facility];  Service: Urology;  Laterality: Left;   KNEE ARTHROSCOPY Bilateral x2 right/  left 2015   LUMBAR LAMINECTOMY/DECOMPRESSION MICRODISCECTOMY N/A 09/28/2012   Procedure: L3  -L5 DECOMPRESSION L4 - L5 FUSION WITH INSTRUMENTATION 2 LEVELS;  Surgeon: Donaciano Sprang, MD;  Location: [facility] OR;  Service: Orthopedics;  Laterality: N/A;   RIGHT/LEFT HEART CATH AND CORONARY ANGIOGRAPHY N/A 01/27/2022   Procedure: RIGHT/LEFT HEART CATH AND CORONARY ANGIOGRAPHY;  Surgeon: Wendel Lurena POUR, MD;  Location: [facility] INVASIVE CV LAB;  Service: Cardiovascular;  Laterality: N/A;   TEE WITHOUT CARDIOVERSION N/A 12/11/2021   Procedure: TRANSESOPHAGEAL ECHOCARDIOGRAM (TEE);  Surgeon: Jack Soyla LABOR, MD;  Location: [facility] ENDOSCOPY;  Service: Cardiology;  Laterality: N/A;   TEE WITHOUT CARDIOVERSION N/A 03/25/2022   Procedure: TRANSESOPHAGEAL ECHOCARDIOGRAM (TEE);  Surgeon: Thukkani, Arun K, MD;  Location: [facility] INVASIVE CV LAB;  Service: Cardiovascular;  Laterality: N/A;   TONSILLECTOMY  as child   TOTAL KNEE ARTHROPLASTY Left 10/02/2018   Procedure: TOTAL LEFT  KNEE ARTHROPLASTY;  Surgeon: Ernie Donnice, MD;  Location: [facility] ORS;  Service: Orthopedics;  Laterality: Left;  90 mins- Ok per Tiffany   TOTAL SHOULDER ARTHROPLASTY Right 01/14/2011   TRANSCAROTID ARTERY  REVASCULARIZATION  Right 05/09/2023   Procedure: Right Transcarotid Artery Revascularization;  Surgeon: Lanis Fonda BRAVO, MD;  Location: [facility] OR;  Service: Vascular;  Laterality: Right;   TRANSCATHETER MITRAL EDGE TO EDGE REPAIR N/A 03/25/2022   Procedure: MITRAL VALVE REPAIR;  Surgeon: Wendel Lurena POUR, MD;  Location: [facility] INVASIVE CV LAB;  Service: Cardiovascular;  Laterality: N/A;   TRANSTHORACIC ECHOCARDIOGRAM  05-21-2015   mild LVH,  ef 55-605,  grade 2 diastolic dysfunction, basal inferior hyperkinesis/  moderate AV calcification sclerosis without stenosis/  trivial MR/  moderate LAE/  mild TR/  mild RAE   ULTRASOUND GUIDANCE FOR VASCULAR ACCESS Left 05/09/2023   Procedure: ULTRASOUND GUIDANCE FOR VASCULAR ACCESS, LEFT FEMORAL VEIN;  Surgeon: Lanis Fonda BRAVO, MD;  Location: [facility] OR;  Service: Vascular;  Laterality: Left;    FAMILY HISTORY: Family History  Problem Relation Age of Onset   Heart failure Mother    Prostate cancer Father     SOCIAL HISTORY: Social History   Socioeconomic History   Marital status: Married    Spouse name: Not on file   Number of children: 2   Years of education: Not on file   Highest education level: Not on file  Occupational History   Occupation: retired  Tobacco Use   Smoking status: Former    Current packs/day: 0.00    Average packs/day: 1 pack/day for 3.0 years (3.0 ttl pk-yrs)    Types: Cigarettes    Start date: 09/27/1959    Quit date: 09/26/1962    Years since quitting: 61.4   Smokeless tobacco: Never  Vaping Use   Vaping status: Never Used  Substance and Sexual Activity   Alcohol use: No   Drug use: No   Sexual activity: Not Currently  Other Topics Concern   Not on file  Social History Narrative   Lives at home with wife.  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     Social Drivers of Corporate investment banker Strain: Not on file  Food Insecurity: No Food Insecurity (  08/02/2023)   Hunger Vital Sign    Worried About Running Out of Food in the Last  Year: Never true    Ran Out of Food in the Last Year: Never true  Transportation Needs: No Transportation Needs (08/02/2023)   PRAPARE - Administrator, Civil Service (Medical): No    Lack of Transportation (Non-Medical): No  Physical Activity: Not on file  Stress: Not on file  Social Connections: Moderately Integrated (08/07/2023)   Social Connection and Isolation Panel    Frequency of Communication with Friends and Family: Three times a week    Frequency of Social Gatherings with Friends and Family: Twice a week    Attends Religious Services: More than 4 times per year    Active Member of [employer] or Organizations: Yes    Attends Banker Meetings: 1 to 4 times per year    Marital Status: Widowed  Intimate Partner Violence: Not At Risk (08/02/2023)   Humiliation, Afraid, Rape, and Kick questionnaire    Fear of Current or Ex-Partner: No    Emotionally Abused: No    Physically Abused: No    Sexually Abused: No      Modena Callander, M.D. Ph.D.  [facility] [address], [address] [place], [place] [address] Ph: [phone] Fax: [phone]  CC:  Jerri Pfeiffer, MD [address] [place],  [place] [address]  Arloa Elsie SAUNDERS, MD   I personally spent a total of 60 minutes in the care of the patient today including preparing to see the patient, getting/reviewing separately obtained history, and performing a medically appropriate exam/evaluation.

## 2024-03-27 ENCOUNTER — Other Ambulatory Visit: Payer: Self-pay | Admitting: *Deleted

## 2024-03-27 DIAGNOSIS — I4891 Unspecified atrial fibrillation: Secondary | ICD-10-CM

## 2024-03-27 MED ORDER — APIXABAN 5 MG PO TABS
ORAL_TABLET | ORAL | 1 refills | Status: AC
Start: 2024-03-27 — End: ?

## 2024-03-27 NOTE — Telephone Encounter (Addendum)
 Prescription refill request for Eliquis  received. Indication: AF Last office visit: 12/22/23  JONETTA Shuck MD Scr: 1.78 on 12/22/23  Epic Age: 88 Weight: 74.4kg  Based on above findings Eliquis  2.5mg  twice daily is the appropriate dose.  Pt is cutting 5mg  tablet in half against our advise.  Pt wishes to continue to do so.

## 2024-04-04 DIAGNOSIS — R972 Elevated prostate specific antigen [PSA]: Secondary | ICD-10-CM | POA: Diagnosis not present

## 2024-04-09 DIAGNOSIS — L578 Other skin changes due to chronic exposure to nonionizing radiation: Secondary | ICD-10-CM | POA: Diagnosis not present

## 2024-04-09 DIAGNOSIS — D0422 Carcinoma in situ of skin of left ear and external auricular canal: Secondary | ICD-10-CM | POA: Diagnosis not present

## 2024-04-09 DIAGNOSIS — L720 Epidermal cyst: Secondary | ICD-10-CM | POA: Diagnosis not present

## 2024-04-09 DIAGNOSIS — D485 Neoplasm of uncertain behavior of skin: Secondary | ICD-10-CM | POA: Diagnosis not present

## 2024-04-09 DIAGNOSIS — L570 Actinic keratosis: Secondary | ICD-10-CM | POA: Diagnosis not present

## 2024-04-09 DIAGNOSIS — L821 Other seborrheic keratosis: Secondary | ICD-10-CM | POA: Diagnosis not present

## 2024-04-12 DIAGNOSIS — N401 Enlarged prostate with lower urinary tract symptoms: Secondary | ICD-10-CM | POA: Diagnosis not present

## 2024-04-12 DIAGNOSIS — R350 Frequency of micturition: Secondary | ICD-10-CM | POA: Diagnosis not present

## 2024-04-12 DIAGNOSIS — N200 Calculus of kidney: Secondary | ICD-10-CM | POA: Diagnosis not present

## 2024-04-12 DIAGNOSIS — N281 Cyst of kidney, acquired: Secondary | ICD-10-CM | POA: Diagnosis not present

## 2024-05-09 NOTE — Progress Notes (Unsigned)
 Office Note    HPI: [name] is a 88 y.o. (04-08-1936) male presenting in follow-up status post right TCAR for symptomatic carotid stenosis 05/09/2023 - 10x4mm stent. Symptom right eye Hollenhorst plaque.  On exam, [name] was doing well.  He had no complaints.  Denied symptoms of TIA, stroke, amaurosis. Denied wound healing concerns.  Medications include Eliquis , aspirin , Lipitor  80 mg  Past Medical History:  Diagnosis Date   Arthritis    knees,back   Atrial fibrillation (HCC)    CAD (coronary artery disease) CARDIOLOGIST-  DR HOCHREIN   previous myocardial infarction in 2001 with 90% circumflex lesion treated with PTCA. The LAD had 70-80% stenosis which was treawted with angioplasty. Most recent catheterization in 2003 demonstrated the LAD at 30% mid stenosis, circumflex 60-70% stenosis in the mid AV grove, and 40% stenosis in the previously stented area. The right coronary artery had 30-40% stenosis in the mid portion.    Chronic diastolic (congestive) heart failure (HCC)    CKD (chronic kidney disease) stage 3, GFR 30-59 ml/min (HCC)    GERD (gastroesophageal reflux disease)    History of kidney stones    Hypertension    Hypothyroidism    Left ureteral stone    Pre-diabetes    S/P mitral valve clip implantation 03/25/2022   s/p TEER wtih two XTW MitraClips by Dr. Wonda and Wendel   Severe mitral regurgitation    Skin cancer    face    Past Surgical History:  Procedure Laterality Date   APPENDECTOMY  as teen   BUBBLE STUDY  12/11/2021   Procedure: BUBBLE STUDY;  Surgeon: Loni Soyla LABOR, MD;  Location: [facility] ENDOSCOPY;  Service: Cardiology;;   CARDIAC CATHETERIZATION  03-29-2000   dr hochrein   Nonobstuctive CAD /   CFX patent stent and LAD diffuse luminal irregularities including mid segment previously been angioplastied   CARDIOVASCULAR STRESS TEST  05-09-2015   dr hochrein   low risk perfusion study/  no reversible ischemia/  normal LV function and wall motion ,  ef 63%   CARDIOVERSION N/A 04/16/2022   Procedure: CARDIOVERSION;  Surgeon: Barbaraann Darryle Ned, MD;  Location: [facility] ENDOSCOPY;  Service: Cardiovascular;  Laterality: N/A;   CATARACT EXTRACTION W/ INTRAOCULAR LENS  IMPLANT, BILATERAL  left 05-06-2015/  right 06-10-2015   CORONARY ANGIOPLASTY WITH STENT PLACEMENT  01-21-2000  dr hochrein   PTCA to pLAD/  PTCA and BMS x1 to mid LCFX/  normal LVSF   CORONARY ANGIOPLASTY WITH STENT PLACEMENT  01-10-2002  dr charlanne   PTCA and BMS to mid AV LCFX (in-stent restenosis)/  diffuse 30-40% mRCA ,  mLAD diffuse 30%,  normal LVSF, ef 65%   CYSTOSCOPY/RETROGRADE/URETEROSCOPY/STONE EXTRACTION WITH BASKET Left 05/23/2015   Procedure: CYSTOSCOPY, FULGERATION OF PROSTATE,  LEFT URETEROSCOPY, JJ STENT PLACEMENT;  Surgeon: Donnice Brooks, MD;  Location: [facility];  Service: Urology;  Laterality: Left;   ELECTROPHYSIOLOGIC STUDY  03-30-2000   dr fernande   EXTRACORPOREAL SHOCK WAVE LITHOTRIPSY Left 10-30-2012   EXTRACORPOREAL SHOCK WAVE LITHOTRIPSY Right 11/16/2021   Procedure: EXTRACORPOREAL SHOCK WAVE LITHOTRIPSY (ESWL);  Surgeon: Matilda Senior, MD;  Location: [facility];  Service: Urology;  Laterality: Right;   HOLMIUM LASER APPLICATION Left 05/23/2015   Procedure: WITH HOLMIUM LASER LITHOTRIPSY;  Surgeon: Donnice Brooks, MD;  Location: [facility];  Service: Urology;  Laterality: Left;   KNEE ARTHROSCOPY Bilateral x2 right/  left 2015   LUMBAR LAMINECTOMY/DECOMPRESSION MICRODISCECTOMY N/A 09/28/2012   Procedure: L3  -  L5 DECOMPRESSION L4 - L5 FUSION WITH INSTRUMENTATION 2 LEVELS;  Surgeon: Donaciano Sprang, MD;  Location: [facility] OR;  Service: Orthopedics;  Laterality: N/A;   RIGHT/LEFT HEART CATH AND CORONARY ANGIOGRAPHY N/A 01/27/2022   Procedure: RIGHT/LEFT HEART CATH AND CORONARY ANGIOGRAPHY;  Surgeon: Wendel Lurena POUR, MD;  Location: [facility] INVASIVE CV LAB;  Service: Cardiovascular;  Laterality: N/A;   TEE WITHOUT  CARDIOVERSION N/A 12/11/2021   Procedure: TRANSESOPHAGEAL ECHOCARDIOGRAM (TEE);  Surgeon: Loni Soyla LABOR, MD;  Location: [facility] ENDOSCOPY;  Service: Cardiology;  Laterality: N/A;   TEE WITHOUT CARDIOVERSION N/A 03/25/2022   Procedure: TRANSESOPHAGEAL ECHOCARDIOGRAM (TEE);  Surgeon: Thukkani, Arun K, MD;  Location: [facility] INVASIVE CV LAB;  Service: Cardiovascular;  Laterality: N/A;   TONSILLECTOMY  as child   TOTAL KNEE ARTHROPLASTY Left 10/02/2018   Procedure: TOTAL LEFT  KNEE ARTHROPLASTY;  Surgeon: Ernie Cough, MD;  Location: [facility] ORS;  Service: Orthopedics;  Laterality: Left;  90 mins- Ok per Tiffany   TOTAL SHOULDER ARTHROPLASTY Right 01/14/2011   TRANSCAROTID ARTERY REVASCULARIZATION  Right 05/09/2023   Procedure: Right Transcarotid Artery Revascularization;  Surgeon: Lanis Fonda BRAVO, MD;  Location: [facility] OR;  Service: Vascular;  Laterality: Right;   TRANSCATHETER MITRAL EDGE TO EDGE REPAIR N/A 03/25/2022   Procedure: MITRAL VALVE REPAIR;  Surgeon: Wendel Lurena POUR, MD;  Location: [facility] INVASIVE CV LAB;  Service: Cardiovascular;  Laterality: N/A;   TRANSTHORACIC ECHOCARDIOGRAM  05-21-2015   mild LVH,  ef 55-605,  grade 2 diastolic dysfunction, basal inferior hyperkinesis/  moderate AV calcification sclerosis without stenosis/  trivial MR/  moderate LAE/  mild TR/  mild RAE   ULTRASOUND GUIDANCE FOR VASCULAR ACCESS Left 05/09/2023   Procedure: ULTRASOUND GUIDANCE FOR VASCULAR ACCESS, LEFT FEMORAL VEIN;  Surgeon: Lanis Fonda BRAVO, MD;  Location[facility] OR;  Service: Vascular;  Laterality: Left;    Social History   Socioeconomic History   Marital status: Married    Spouse name: Not on file   Number of children: 2   Years of education: Not on file   Highest education level: Not on file  Occupational History   Occupation: retired  Tobacco Use   Smoking status: Former    Current packs/day: 0.00    Average packs/day: 1 pack/day for 3.0 years (3.0 ttl pk-yrs)    Types: Cigarettes    Start date: 09/27/1959     Quit date: 09/26/1962    Years since quitting: 61.6   Smokeless tobacco: Never  Vaping Use   Vaping status: Never Used  Substance and Sexual Activity   Alcohol use: No   Drug use: No   Sexual activity: Not Currently  Other Topics Concern   Not on file  Social History Narrative   Lives at home with wife.  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     Social Drivers of Corporate investment banker Strain: Not on file  Food Insecurity: No Food Insecurity (08/02/2023)   Hunger Vital Sign    Worried About Running Out of Food in the Last Year: Never true    Ran Out of Food in the Last Year: Never true  Transportation Needs: No Transportation Needs (08/02/2023)   PRAPARE - Administrator, Civil Service (Medical): No    Lack of Transportation (Non-Medical): No  Physical Activity: Not on file  Stress: Not on file  Social Connections: Moderately Integrated (08/07/2023)   Social Connection and Isolation Panel    Frequency of Communication with Friends and Family: Three times a week  Frequency of Social Gatherings with Friends and Family: Twice a week    Attends Religious Services: More than 4 times per year    Active Member of [employer] or Organizations: Yes    Attends Banker Meetings: 1 to 4 times per year    Marital Status: Widowed  Intimate Partner Violence: Not At Risk (08/02/2023)   Humiliation, Afraid, Rape, and Kick questionnaire    Fear of Current or Ex-Partner: No    Emotionally Abused: No    Physically Abused: No    Sexually Abused: No   Family History  Problem Relation Age of Onset   Heart failure Mother    Prostate cancer Father     Current Outpatient Medications  Medication Sig Dispense Refill   acetaminophen  (TYLENOL ) 500 MG tablet Take 500 mg by mouth every 6 (six) hours as needed for moderate pain.     amiodarone  (PACERONE ) 200 MG tablet Take 1 tablet (200 mg total) by mouth daily. 90 tablet 3   amLODipine  (NORVASC ) 2.5 MG tablet Take 1 tablet (2.5 mg total)  by mouth daily. 90 tablet 3   apixaban  (ELIQUIS ) 5 MG TABS tablet Take 1/2 tablet by mouth twice daily 90 tablet 1   aspirin  EC 81 MG tablet Take 1 tablet (81 mg total) by mouth daily. Swallow whole.     atorvastatin  (LIPITOR ) 80 MG tablet Take 1 tablet (80 mg total) by mouth at bedtime. 90 tablet 3   Cholecalciferol (VITAMIN D) 50 MCG (2000 UT) tablet Take 2,000 Units by mouth daily.     Cyanocobalamin (B-12) 5000 MCG CAPS Take 5,000 mcg by mouth daily.     dapagliflozin  propanediol (FARXIGA ) 10 MG TABS tablet Take 1 tablet (10 mg total) by mouth daily before breakfast. 90 tablet 3   ezetimibe  (ZETIA ) 10 MG tablet Take 1 tablet (10 mg total) by mouth daily. 90 tablet 3   ferrous sulfate  325 (65 FE) MG tablet Take 325 mg by mouth daily with breakfast.     finasteride  (PROSCAR ) 5 MG tablet Take 5 mg by mouth daily.     folic acid  (FOLVITE ) 800 MCG tablet Take 800 mcg by mouth daily.      furosemide  (LASIX ) 20 MG tablet Take 20 mg by mouth 3 (three) times a week.     HYDROcodone -acetaminophen  (NORCO) 10-325 MG tablet Take 1 tablet by mouth as needed.     hydrOXYzine (ATARAX) 10 MG tablet Take 10 mg by mouth as needed.     levothyroxine  (SYNTHROID ) 50 MCG tablet Take 50 mcg by mouth every morning.     niacin  500 MG tablet Take 500 mg by mouth at bedtime.     nitroGLYCERIN  (NITROSTAT ) 0.4 MG SL tablet Place 1 tablet (0.4 mg total) under the tongue every 5 (five) minutes as needed for chest pain. 25 tablet 3   pantoprazole  (PROTONIX ) 40 MG tablet TAKE ONE TABLET BY MOUTH EVERY DAY 90 tablet 3   polyethylene glycol (MIRALAX  / GLYCOLAX ) 17 g packet Take 17 g by mouth daily as needed for mild constipation.     traMADol  (ULTRAM ) 50 MG tablet Take 1 tablet (50 mg total) by mouth every 6 (six) hours as needed for moderate pain (pain score 4-6). 20 tablet 0   No current facility-administered medications for this visit.    No Known Allergies   REVIEW OF SYSTEMS:  [X]  denotes positive finding, [ ]   denotes negative finding Cardiac  Comments:  Chest pain or chest pressure:    Shortness  of breath upon exertion:    Short of breath when lying flat:    Irregular heart rhythm:        Vascular    Pain in calf, thigh, or hip brought on by ambulation:    Pain in feet at night that wakes you up from your sleep:     Blood clot in your veins:    Leg swelling:         Pulmonary    Oxygen at home:    Productive cough:     Wheezing:         Neurologic    Sudden weakness in arms or legs:     Sudden numbness in arms or legs:     Sudden onset of difficulty speaking or slurred speech:    Temporary loss of vision in one eye:     Problems with dizziness:         Gastrointestinal    Blood in stool:     Vomited blood:         Genitourinary    Burning when urinating:     Blood in urine:        Psychiatric    Major depression:         Hematologic    Bleeding problems:    Problems with blood clotting too easily:        Skin    Rashes or ulcers:        Constitutional    Fever or chills:      PHYSICAL EXAMINATION:  There were no vitals filed for this visit.  General:  WDWN in NAD; vital signs documented above Gait: Not observed HENT: WNL, normocephalic, neck incision well-healed Pulmonary: normal non-labored breathing , without wheezing Cardiac: regular HR Abdomen: soft, NT, no masses Skin: without rashes Vascular Exam/Pulses:  Right Left  Radial 2+ (normal) 2+ (normal)                       Extremities: without ischemic changes, without Gangrene , without cellulitis; without open wounds;  Musculoskeletal: no muscle wasting or atrophy  Neurologic: A&O X 3;  No focal weakness or paresthesias are detected Psychiatric:  The pt has Normal affect.   Non-Invasive Vascular Imaging:   Summary:  Right Carotid: Velocities in the right ICA are consistent with a 1-39%  stenosis.                Non-hemodynamically significant plaque <50% noted in the  CCA.                 Patent Right ICA stent.   Left Carotid: Velocities in the left ICA are consistent with a 1-39%  stenosis.               Non-hemodynamically significant plaque <50% noted in the  CCA.   Vertebrals: Bilateral vertebral arteries demonstrate antegrade flow.  Subclavians: Normal flow hemodynamics were seen in bilateral subclavian               arteries.     ASSESSMENT/PLAN: [name] is a 88 y.o. male presenting for follow-up status post right-sided ICA stenting for symptomatic ICA stenosis.  Faolan has been doing well since discharge. Remains asymptomatic. Carotid duplex ultrasound reviewed demonstrating widely patent right-sided internal carotid artery stent.  The left-sided internal carotid arteries were also widely patent.  My plan is to see him again in 9 months. He was asked to continue his current medication regimen  and call if any questions or concerns arise.   Fonda FORBES Rim, MD [facility] [phone]

## 2024-05-10 ENCOUNTER — Other Ambulatory Visit (HOSPITAL_COMMUNITY): Payer: Self-pay | Admitting: Vascular Surgery

## 2024-05-10 ENCOUNTER — Encounter: Payer: Self-pay | Admitting: Vascular Surgery

## 2024-05-10 ENCOUNTER — Ambulatory Visit
Admission: RE | Admit: 2024-05-10 | Discharge: 2024-05-10 | Disposition: A | Discharge status: Home / Self Care | Source: Ambulatory Visit | Attending: Vascular Surgery | Admitting: Vascular Surgery

## 2024-05-10 ENCOUNTER — Ambulatory Visit: Admitting: Vascular Surgery

## 2024-05-10 VITALS — BP 130/72 | HR 59 | Temp 97.7°F | Resp 18 | Ht 69.0 in | Wt 172.7 lb

## 2024-05-10 DIAGNOSIS — I6529 Occlusion and stenosis of unspecified carotid artery: Secondary | ICD-10-CM

## 2024-05-10 DIAGNOSIS — Z9862 Peripheral vascular angioplasty status: Secondary | ICD-10-CM

## 2024-05-10 DIAGNOSIS — I6523 Occlusion and stenosis of bilateral carotid arteries: Secondary | ICD-10-CM | POA: Diagnosis not present

## 2024-06-13 DIAGNOSIS — M1711 Unilateral primary osteoarthritis, right knee: Secondary | ICD-10-CM | POA: Diagnosis not present

## 2024-06-14 ENCOUNTER — Telehealth (HOSPITAL_BASED_OUTPATIENT_CLINIC_OR_DEPARTMENT_OTHER): Payer: Self-pay

## 2024-06-14 NOTE — Telephone Encounter (Signed)
   Pre-operative Risk Assessment    Patient Name: [name]  DOB: 02-12-1936 MRN: [id number]   Date of last office visit: 03/23/23 with Kate Minus Date of next office visit: na   Request for Surgical Clearance    Procedure:  right total knee arthroplasty  Date of Surgery:  Clearance TBD - february                               Surgeon:  Dr. Kay Socks Group or Practice Name:  [facility] Phone number:  [phone] Fax number:  [phone]   Type of Clearance Requested:   - Medical  - Pharmacy:  Hold Aspirin  and Apixaban  (Eliquis ) not indicated   Type of Anesthesia:  Choice    Additional requests/questions:    Bonney Augustin JONETTA Delores   06/14/2024, 4:13 PM

## 2024-06-15 NOTE — Telephone Encounter (Signed)
   Name: [name]  DOB: 1935-09-17  MRN: [id number]  Primary Cardiologist: Lynwood Schilling, MD / Dr. Rolan  Chart reviewed as part of pre-operative protocol coverage. Patient is followed by Dr. Rolan in [facility] (she has not been seen by General Cardiology since 2023). He was last seen by Dr. Rolan in 12/2023 and was advised to follow back up in 6 months. Surgery is not scheduled until 09/2024. Therefore,  he will require a follow-up in-office visit in order to better assess preoperative cardiovascular risk. This can be in the CHF clinic since he is due for routine follow-up with them.   Pre-op covering staff: - Please schedule appointment and call patient to inform them. If patient already had an upcoming appointment within acceptable timeframe, please add pre-op clearance to the appointment notes so provider is aware. - Please contact requesting surgeon's office via preferred method (i.e, phone, fax) to inform them of need for appointment prior to surgery.  This message will also be routed to pharmacy pool for input on holding Eliquis  as requested below so that this information is available to the clearing provider at time of patient's appointment.   Will remove from pre-op pool.   Irbin Fines E Lizza Huffaker, PA-C  06/15/2024, 8:49 AM

## 2024-06-15 NOTE — Telephone Encounter (Signed)
 Called patient to schedule an office appointment for a pre-op clearance on 07/04/24 @ 1:55 with Glendia Ferrier.

## 2024-06-18 ENCOUNTER — Other Ambulatory Visit: Payer: Self-pay | Admitting: Cardiovascular Disease

## 2024-06-18 NOTE — Telephone Encounter (Signed)
 Patient with diagnosis of A Fib on Eliquis  for anticoagulation.    Procedure: right total knee arthroplasty  Date of procedure: TBD   CHA2DS2-VASc Score = 7  This indicates a 11.2% annual risk of stroke. The patient's score is based upon: CHF History: 1 HTN History: 1 Diabetes History: 0 Stroke History: 2 Vascular Disease History: 1 Age Score: 2 Gender Score: 0      CrCl 33 ml/min Platelet count 179K  Patient has not had an Afib/aflutter ablation in the last 3 months, DCCV within the last 4 weeks or a watchman implanted in the last 45 days   Patient will need to hold Eliquis  for 3 days prior to procedure. Due to elevated risk score, will include Dr Lavona for input  **This guidance is not considered finalized until pre-operative APP has relayed final recommendations.**

## 2024-06-20 NOTE — Telephone Encounter (Signed)
 Ok to hold 3 days per Dr Lavona

## 2024-07-03 ENCOUNTER — Encounter: Payer: Self-pay | Admitting: *Deleted

## 2024-07-03 NOTE — Progress Notes (Deleted)
{  This patient may be at risk for Amyloid. He has one or more dx on the prob list or PMH from the following list -  Abnormal EKG, HFpEF/Diastolic CHF, Aortic Stenosis, LVH, Bilateral Carpal Tunnel Syndrome, Biceps Tendon Rupture, Spinal Stenosis, Pericardial Effusion, Left Atrial Enlargement, Conduction System Disorder. See list below or review PMH.  Diagnoses From Problem List           Noted     Chronic atrial fibrillation (HCC) 12/20/2021     Chronic diastolic CHF (congestive heart failure) (HCC) 08/02/2023     Paroxysmal atrial fibrillation (HCC) 08/02/2023    Click HERE to open Cardiac Amyloid Screening SmartSet to order screening OR Click HERE to defer testing for 1 year or permanently :1}     OFFICE NOTE:    Date:  07/03/2024  ID:  [name], DOB [birth year], MRN [id number] PCP: Arloa Elsie SAUNDERS, MD  [facility] Providers Cardiologist:  Lynwood Schilling, MD { Click to update primary MD,subspecialty MD or APP then REFRESH:1}      *** {There is no content from the last Narrative History section.}      Discussed the use of AI scribe software for clinical note transcription with the patient, who gave verbal consent to proceed.  History of Present Illness     ROS-See HPI***    Studies Reviewed:      *** Results  Risk Assessment/Calculations: {Does this patient have ATRIAL FIBRILLATION?[phone]} No BP recorded.  {Refresh Note OR Click here to enter BP  :1}***      Physical Exam:  VS:  There were no vitals taken for this visit.       Wt Readings from Last 3 Encounters:  05/10/24 172 lb 11.2 oz (78.3 kg)  02/21/24 167 lb (75.8 kg)  12/22/23 164 lb (74.4 kg)    Physical Exam***     Assessment and Plan:    Assessment & Plan  Assessment and Plan Assessment & Plan    {      :1}    {Are you ordering a CV Procedure (e.g. stress test, cath, DCCV, TEE, etc)?   Press F2        :[id number]}  Dispo:  No follow-ups on file.  Signed, Glendia Ferrier, PA-C

## 2024-07-03 NOTE — Telephone Encounter (Signed)
 Glendia Ferrier, [facility] sent me staff message: Ferrier Glendia DASEN, PA-C  Mares, Jolan; Keymora Grillot M, CMA Niels This pt is on my schedule Wed 12/3 for surgical clearance. Note from preop APP (Callie) indicates pt should have been scheduled with CHF clinic. Patient's that are primarily followed in CHF clinic are to be cleared by CHF provider per our protocol. Do you know if there was a reason to schedule with gen cardiology? I can't tell if I am missing something. Thanks! Scott   I reviewed the chart and the CMA had scheduled the pt on the wrong schedule. Per preop APP pt was to be scheduled to see Dr. Rolan at the heart failure clinic. Per my conversation with Glendia Ferrier, [facility] felt best cancel tomorrow's appt and let pt see Dr. Rolan as was intended. I called the pt and he agrees to plan of care. 07/04/24 appt with Glendia Ferrier, PAC has been cancelled. Message has been sent to Dr. Rolan scheduling team to call the pt with an appt for preop clearance.

## 2024-07-04 ENCOUNTER — Ambulatory Visit: Admitting: Physician Assistant

## 2024-07-09 NOTE — Telephone Encounter (Signed)
 Pt has appt 07/11/24 with Heart Failure clinic.

## 2024-07-10 ENCOUNTER — Telehealth (HOSPITAL_COMMUNITY): Payer: Self-pay

## 2024-07-10 NOTE — Telephone Encounter (Signed)
 Called to confirm/remind patient of their appointment at the [facility] on 07/10/2024.   Appointment:   [x] Confirmed  [] Left mess   [] No answer/No voice mail  [] VM Full/unable to leave message  [] Phone not in service  Patient reminded to bring all medications and/or complete list.  Confirmed patient has transportation. Gave directions, instructed to utilize valet parking.

## 2024-07-10 NOTE — Progress Notes (Signed)
 ADVANCED HF CLINIC NOTE   PCP: Arloa Elsie SAUNDERS, MD Cardiology: Dr. Lavona HF Cardiology: Dr. Rolan  Chief complaint: CHF  88 y.o. with history of persistent atrial fibrillation and mitral regurgitation was referred by Dr. Wendel for CHF evaluation prior to Mitraclip.  Patient has history of CAD with MI and POBA to LCx and LAD in 2001 as well as BMS to LCx in 2003.  He reported exertional dyspnea beginning last year that he attributed to the after-effects of COVID-19 x 2 episodes.  He saw Dr. Lavona in 4/23 as a pre-op for lithotripsy and was noted to be in atrial fibrillation (first time that AF had been recorded).  Echo was then done, showing preserved LV EF but severe MR.  TEE in 5/23 showed EF 60-65%, mildly decreased RV systolic function, severe LAE, mild bileaflet myxomatous MV prolapse with severe MR (central).  Cath in 6/23 showed mild nonobstructive coronary disease.    Patient had Mitraclip x 2 in 9/23 with followup echo showing EF 50-55%, normal RV, 2 Mitraclips with moderate MR, mean gradient 5, mild-moderate TR.   Patient had DCCV back to NSR in 9/23.    Echo in 3/24 showed EF 55-60%, severe LAE, Mitraclip x 2 with mild-moderate MR and mean gradient 3 mmHg, IVC normal.   Echo (8/24) EF 55-60%, normal RV, stable MitraClip with mild MR  Admitted 9/24 with symptoms concerning for CVA. MRI brain without acute abnormality. Found to have 60% narrowing of RICA. Vascular consulted and recommending carotid revascularization. Started on triple therapy. In 10/24, patient had right TCAR by Dr. Lanis.   Today he returns for AHF follow up for pre-op clearance for ***. Overall feeling ***. Denies palpitations, CP, dizziness, edema, or PND/Orthopnea. *** SOB. Appetite ok. No fever or chills. Weight at home *** pounds. Taking all medications. Denies ETOH, tobacco or drug use.   ECG (personally reviewed): NSR, normal  PMH: 1. CAD: MI in 2001 with POBA to CFX and LAD.  - LHC 2003  with BMS to LCx. - LHC (6/23): minimal CAD, patent LCx stent.  2. Mitral regurgitation: TEE (5/23) with EF 60-65%, mildly decreased RV systolic function, severe LAE, mild bileaflet myxomatous MV prolapse with severe MR (central).  - RHC (5/23): mean 5, PA 38/10 mean 23, mean PCWP 17 with v waves to 34, CI 3.5. - Mitraclip x 2 in 8/23.  - Echo (9/23): EF 50-55%, normal RV, 2 Mitraclips with moderate MR, mean gradient 5, mild-moderate TR.  - Echo (3/24): EF 55-60%, severe LAE, Mitraclip x 2 with mild-moderate MR and mean gradient 3 mmHg, IVC normal.  - Echo (8/24) EF 55-60%, normal RV, stable MitraClip with mild MR   3. Atrial fibrillation: Persistent since at least 4/23.  - DCCV 9/23 4. HTN 5. Hyperlipidemia 6. Hypothyroidism 7. COVID-19 x 2 8. Nephrolithiasis 9. CKD stage 3 10. R ICA stenosis - Right TCAR 10/24, [place].   Social History   Socioeconomic History   Marital status: Married    Spouse name: Not on file   Number of children: 2   Years of education: Not on file   Highest education level: Not on file  Occupational History   Occupation: retired  Tobacco Use   Smoking status: Former    Current packs/day: 0.00    Average packs/day: 1 pack/day for 3.0 years (3.0 ttl pk-yrs)    Types: Cigarettes    Start date: 09/27/1959    Quit date: 09/26/1962    Years since quitting: 13.8  Smokeless tobacco: Never  Vaping Use   Vaping status: Never Used  Substance and Sexual Activity   Alcohol use: No   Drug use: No   Sexual activity: Not Currently  Other Topics Concern   Not on file  Social History Narrative   Lives at home with wife.  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     Social Drivers of Corporate Investment Banker Strain: Not on file  Food Insecurity: No Food Insecurity (08/02/2023)   Hunger Vital Sign    Worried About Running Out of Food in the Last Year: Never true    Ran Out of Food in the Last Year: Never true  Transportation Needs: No Transportation Needs (08/02/2023)    PRAPARE - Administrator, Civil Service (Medical): No    Lack of Transportation (Non-Medical): No  Physical Activity: Not on file  Stress: Not on file  Social Connections: Moderately Integrated (08/07/2023)   Social Connection and Isolation Panel    Frequency of Communication with Friends and Family: Three times a week    Frequency of Social Gatherings with Friends and Family: Twice a week    Attends Religious Services: More than 4 times per year    Active Member of [employer] or Organizations: Yes    Attends Banker Meetings: 1 to 4 times per year    Marital Status: Widowed  Intimate Partner Violence: Not At Risk (08/02/2023)   Humiliation, Afraid, Rape, and Kick questionnaire    Fear of Current or Ex-Partner: No    Emotionally Abused: No    Physically Abused: No    Sexually Abused: No   Family History  Problem Relation Age of Onset   Heart failure Mother    Prostate cancer Father    ROS: All systems reviewed and negative except as per HPI.   Current Outpatient Medications  Medication Sig Dispense Refill   acetaminophen  (TYLENOL ) 500 MG tablet Take 500 mg by mouth every 6 (six) hours as needed for moderate pain.     amiodarone  (PACERONE ) 200 MG tablet Take 1 tablet (200 mg total) by mouth daily. 90 tablet 3   amLODipine  (NORVASC ) 2.5 MG tablet Take 1 tablet (2.5 mg total) by mouth daily. 90 tablet 3   apixaban  (ELIQUIS ) 5 MG TABS tablet Take 1/2 tablet by mouth twice daily 90 tablet 1   aspirin  EC 81 MG tablet Take 1 tablet (81 mg total) by mouth daily. Swallow whole.     atorvastatin  (LIPITOR ) 80 MG tablet Take 1 tablet (80 mg total) by mouth at bedtime. 90 tablet 3   Cholecalciferol (VITAMIN D) 50 MCG (2000 UT) tablet Take 2,000 Units by mouth daily.     Cyanocobalamin (B-12) 5000 MCG CAPS Take 5,000 mcg by mouth daily.     dapagliflozin  propanediol (FARXIGA ) 10 MG TABS tablet Take 1 tablet (10 mg total) by mouth daily before breakfast. 90 tablet 3    ezetimibe  (ZETIA ) 10 MG tablet Take 1 tablet (10 mg total) by mouth daily. 90 tablet 3   ferrous sulfate  325 (65 FE) MG tablet Take 325 mg by mouth daily with breakfast.     finasteride  (PROSCAR ) 5 MG tablet Take 5 mg by mouth daily.     folic acid  (FOLVITE ) 800 MCG tablet Take 800 mcg by mouth daily.      furosemide  (LASIX ) 20 MG tablet Take 20 mg by mouth 3 (three) times a week.     HYDROcodone -acetaminophen  (NORCO) 10-325 MG tablet Take 1 tablet by  mouth as needed.     hydrOXYzine (ATARAX) 10 MG tablet Take 10 mg by mouth as needed.     levothyroxine  (SYNTHROID ) 50 MCG tablet Take 50 mcg by mouth every morning.     niacin  500 MG tablet Take 500 mg by mouth at bedtime.     nitroGLYCERIN  (NITROSTAT ) 0.4 MG SL tablet Place 1 tablet (0.4 mg total) under the tongue every 5 (five) minutes as needed for chest pain. 25 tablet 3   pantoprazole  (PROTONIX ) 40 MG tablet Take 1 tablet by mouth once daily. 30 tablet 0   polyethylene glycol (MIRALAX  / GLYCOLAX ) 17 g packet Take 17 g by mouth daily as needed for mild constipation.     traMADol  (ULTRAM ) 50 MG tablet Take 1 tablet (50 mg total) by mouth every 6 (six) hours as needed for moderate pain (pain score 4-6). 20 tablet 0   No current facility-administered medications for this visit.   Wt Readings from Last 3 Encounters:  05/10/24 78.3 kg (172 lb 11.2 oz)  02/21/24 75.8 kg (167 lb)  12/22/23 74.4 kg (164 lb)   There were no vitals taken for this visit. General:  *** appearing.  No respiratory difficulty Neck: JVD *** cm.  Cor: Regular rate & rhythm. No murmurs. Lungs: clear Extremities: no edema  Neuro: alert & oriented x 3. Affect pleasant.   Assessment/Plan: 1. Mitral regurgitation: Suspect mixed picture of primary and functional MR, with myxomatous MV prolapse as well as a component of atrial functional MR with central jet and enlarged left atrium. S/p mTEER in 8/23.  Post-mTEER echo in 9/23 showed MV repair with 2 Mitraclips with moderate  MR and mean gradient 5 mmHg.  Echo in 8/24 showed mild MR s/p Mitraclip.  -  I will arrange for repeat echo in 8/25. *** NEEDS TO SCHEDULE 2. Atrial fibrillation: S/p DCCV in 9/23. NSR today.  - Continue amiodarone  200 mg daily.  Check LFTs and TSH ***. He will need regular eye exam.    - Continue apixaban .   3. Chronic diastolic CHF: Echo in 3/24 showed EF 55-60%, severe LAE, Mitraclip x 2 with mild-moderate MR and mean gradient 3 mmHg, IVC normal. Echo in 8/24 showed EF 55-60%, normal RV, stable MitraClip with mild MR  NYHA class II, not volume overloaded on exam.  - Continue Farxiga  10 mg daily.  - Continue Lasix  20 mg every other day. BMET today. 4. HTN: BP elevated today, but better controlled on home readings. - Continue current dose of amlodipine . - Continue to check BP at home and log. If sBP consistently > 150, notify clinic. 5. Carotid stenosis: s/p right TCAR 10/24. - Continue statin, check lipids.  - On Eliquis  and ASA, stop ASA when ok with Dr. Beola.  6. Pre-operative risk assessment: Patient is considering right TKR.  He is > 6 months out past carotid stenting.  I think that he could undergo surgery with moderate cardiac risk. He would have to hold Eliquis .  ***  Follow up in 6 months with APP.  ***    Jennet Scroggin LITTIE Coe 07/10/2024

## 2024-07-11 ENCOUNTER — Ambulatory Visit (HOSPITAL_COMMUNITY): Payer: Self-pay | Admitting: Internal Medicine

## 2024-07-11 ENCOUNTER — Encounter (HOSPITAL_COMMUNITY): Payer: Self-pay

## 2024-07-11 ENCOUNTER — Ambulatory Visit (HOSPITAL_COMMUNITY): Admission: RE | Admit: 2024-07-11 | Discharge: 2024-07-11 | Attending: Internal Medicine

## 2024-07-11 VITALS — BP 138/62 | HR 60 | Ht 68.0 in | Wt 174.2 lb

## 2024-07-11 DIAGNOSIS — Z01810 Encounter for preprocedural cardiovascular examination: Secondary | ICD-10-CM | POA: Diagnosis not present

## 2024-07-11 DIAGNOSIS — Z8616 Personal history of COVID-19: Secondary | ICD-10-CM | POA: Insufficient documentation

## 2024-07-11 DIAGNOSIS — I341 Nonrheumatic mitral (valve) prolapse: Secondary | ICD-10-CM | POA: Diagnosis not present

## 2024-07-11 DIAGNOSIS — I5032 Chronic diastolic (congestive) heart failure: Secondary | ICD-10-CM | POA: Diagnosis not present

## 2024-07-11 DIAGNOSIS — I6529 Occlusion and stenosis of unspecified carotid artery: Secondary | ICD-10-CM | POA: Diagnosis not present

## 2024-07-11 DIAGNOSIS — N183 Chronic kidney disease, stage 3 unspecified: Secondary | ICD-10-CM | POA: Diagnosis not present

## 2024-07-11 DIAGNOSIS — I4891 Unspecified atrial fibrillation: Secondary | ICD-10-CM | POA: Diagnosis not present

## 2024-07-11 DIAGNOSIS — Z8249 Family history of ischemic heart disease and other diseases of the circulatory system: Secondary | ICD-10-CM | POA: Insufficient documentation

## 2024-07-11 DIAGNOSIS — I252 Old myocardial infarction: Secondary | ICD-10-CM | POA: Diagnosis not present

## 2024-07-11 DIAGNOSIS — I6521 Occlusion and stenosis of right carotid artery: Secondary | ICD-10-CM

## 2024-07-11 DIAGNOSIS — I498 Other specified cardiac arrhythmias: Secondary | ICD-10-CM | POA: Diagnosis not present

## 2024-07-11 DIAGNOSIS — I340 Nonrheumatic mitral (valve) insufficiency: Secondary | ICD-10-CM | POA: Diagnosis not present

## 2024-07-11 DIAGNOSIS — I130 Hypertensive heart and chronic kidney disease with heart failure and stage 1 through stage 4 chronic kidney disease, or unspecified chronic kidney disease: Secondary | ICD-10-CM | POA: Diagnosis not present

## 2024-07-11 DIAGNOSIS — Z79899 Other long term (current) drug therapy: Secondary | ICD-10-CM | POA: Insufficient documentation

## 2024-07-11 DIAGNOSIS — Z7901 Long term (current) use of anticoagulants: Secondary | ICD-10-CM | POA: Insufficient documentation

## 2024-07-11 DIAGNOSIS — I10 Essential (primary) hypertension: Secondary | ICD-10-CM | POA: Diagnosis not present

## 2024-07-11 DIAGNOSIS — I4819 Other persistent atrial fibrillation: Secondary | ICD-10-CM | POA: Diagnosis not present

## 2024-07-11 DIAGNOSIS — Z7982 Long term (current) use of aspirin: Secondary | ICD-10-CM | POA: Diagnosis not present

## 2024-07-11 DIAGNOSIS — Z01818 Encounter for other preprocedural examination: Secondary | ICD-10-CM | POA: Diagnosis not present

## 2024-07-11 DIAGNOSIS — Z87891 Personal history of nicotine dependence: Secondary | ICD-10-CM | POA: Insufficient documentation

## 2024-07-11 DIAGNOSIS — I2510 Atherosclerotic heart disease of native coronary artery without angina pectoris: Secondary | ICD-10-CM | POA: Insufficient documentation

## 2024-07-11 LAB — BASIC METABOLIC PANEL WITH GFR
Anion gap: 9 (ref 5–15)
BUN: 22 mg/dL (ref 8–23)
CO2: 26 mmol/L (ref 22–32)
Calcium: 9.4 mg/dL (ref 8.9–10.3)
Chloride: 108 mmol/L (ref 98–111)
Creatinine, Ser: 1.84 mg/dL — ABNORMAL HIGH (ref 0.61–1.24)
GFR, Estimated: 35 mL/min — ABNORMAL LOW (ref >60)
Glucose, Bld: 109 mg/dL — ABNORMAL HIGH (ref 70–99)
Potassium: 4 mmol/L (ref 3.5–5.1)
Sodium: 143 mmol/L (ref 135–145)

## 2024-07-11 LAB — BRAIN NATRIURETIC PEPTIDE: B Natriuretic Peptide: 296.7 pg/mL — ABNORMAL HIGH (ref 0.0–100.0)

## 2024-07-11 NOTE — Patient Instructions (Signed)
 Medication Changes:  No Changes In Medications at this time.   Lab Work:  Labs done today, your results will be available in MyChart, we will contact you for abnormal readings.  Testing/Procedures:  ECHOCARDIOGRAM AS SCHEDULED   Follow-Up in: 4 MONTHS WITH DR. ROLAN PLEASE CALL OUR OFFICE AROUND [place] TO GET SCHEDULED FOR YOUR APPOINTMENT. PHONE NUMBER IS [phone] OPTION 2 E   At the [facility], you and your health needs are our priority. We have a designated team specialized in the treatment of Heart Failure. This Care Team includes your primary Heart Failure Specialized Cardiologist (physician), Advanced Practice Providers (APPs- Physician Assistants and Nurse Practitioners), and Pharmacist who all work together to provide you with the care you need, when you need it.   You may see any of the following providers on your designated Care Team at your next follow up:  Dr. Toribio Fuel Dr. Ezra Rolan Dr. Odis Brownie Greig Mosses, NP Caffie Shed, [place] [facility] [place], [place] Beckey Coe, NP Jordan Lee, NP Tinnie Redman, PharmD   Please be sure to bring in all your medications bottles to every appointment.   Need to Contact Us :  If you have any questions or concerns before your next appointment please send us  a message through Silo or call our office at [phone].    TO LEAVE A MESSAGE FOR THE NURSE SELECT OPTION 2, PLEASE LEAVE A MESSAGE INCLUDING: YOUR NAME DATE OF BIRTH CALL BACK NUMBER REASON FOR CALL**this is important as we prioritize the call backs  YOU WILL RECEIVE A CALL BACK THE SAME DAY AS LONG AS YOU CALL BEFORE 4:00 PM

## 2024-07-18 ENCOUNTER — Ambulatory Visit (HOSPITAL_COMMUNITY): Admission: RE | Admit: 2024-07-18 | Discharge: 2024-07-18 | Attending: Cardiology | Admitting: Cardiology

## 2024-07-18 DIAGNOSIS — I083 Combined rheumatic disorders of mitral, aortic and tricuspid valves: Secondary | ICD-10-CM | POA: Diagnosis not present

## 2024-07-18 DIAGNOSIS — I5032 Chronic diastolic (congestive) heart failure: Secondary | ICD-10-CM | POA: Insufficient documentation

## 2024-07-18 DIAGNOSIS — I110 Hypertensive heart disease with heart failure: Secondary | ICD-10-CM | POA: Diagnosis not present

## 2024-07-18 LAB — ECHOCARDIOGRAM COMPLETE
AR max vel: 1.1 cm2
AV Area VTI: 1.09 cm2
AV Area mean vel: 1.04 cm2
AV Mean grad: 8.5 mmHg
AV Peak grad: 14.6 mmHg
Ao pk vel: 1.91 m/s
Area-P 1/2: 1.83 cm2
Calc EF: 47.2 %
MV VTI: 0.97 cm2
S' Lateral: 3.8 cm
Single Plane A2C EF: 49.9 %
Single Plane A4C EF: 45.9 %

## 2024-08-01 ENCOUNTER — Other Ambulatory Visit (HOSPITAL_COMMUNITY): Payer: Self-pay | Admitting: Cardiology

## 2024-08-01 MED ORDER — DAPAGLIFLOZIN PROPANEDIOL 10 MG PO TABS: 10.0000 mg | ORAL_TABLET | Freq: Every day | ORAL | 3 refills | Status: AC

## 2024-09-05 NOTE — Progress Notes (Signed)
 Sent message, via epic in basket, requesting orders in epic from Careers adviser.

## 2024-09-06 NOTE — H&P (Signed)
 " Patient's anticipated LOS is less than 2 midnights, meeting these requirements: - Younger than 46 - Lives within 1 hour of care - Has a competent adult at home to recover with post-op recover - NO history of  - Chronic pain requiring opiods  - Diabetes  - Coronary Artery Disease  - Heart failure  - Heart attack  - Stroke  - DVT/VTE  - Cardiac arrhythmia  - Respiratory Failure/COPD  - Renal failure  - Anemia  - Advanced Liver disease     [name] is an 89 y.o. male.    Chief Complaint: right knee pain  HPI: Pt is a 89 y.o. male complaining of right knee pain for multiple years. Pain had continually increased since the beginning. X-rays in the clinic show end-stage arthritic changes of the right knee. Pt has tried various conservative treatments which have failed to alleviate their symptoms, including injections and therapy. Various options are discussed with the patient. Risks, benefits and expectations were discussed with the patient. Patient understand the risks, benefits and expectations and wishes to proceed with surgery.   PCP:  Arloa Elsie SAUNDERS, MD  D/C Plans: Home  PMH: Past Medical History:  Diagnosis Date   Arthritis    knees,back   Atrial fibrillation [facility])    CAD (coronary artery disease) CARDIOLOGIST-  DR HOCHREIN   previous myocardial infarction in 2001 with 90% circumflex lesion treated with PTCA. The LAD had 70-80% stenosis which was treawted with angioplasty. Most recent catheterization in 2003 demonstrated the LAD at 30% mid stenosis, circumflex 60-70% stenosis in the mid AV grove, and 40% stenosis in the previously stented area. The right coronary artery had 30-40% stenosis in the mid portion.    Chronic diastolic (congestive) heart failure (HCC)    CKD (chronic kidney disease) stage 3, GFR 30-59 ml/min (HCC)    GERD (gastroesophageal reflux disease)    History of kidney stones    Hypertension    Hypothyroidism    Left ureteral stone     Pre-diabetes    S/P mitral valve clip implantation 03/25/2022   s/p TEER wtih two XTW MitraClips by Dr. Wonda and Wendel   Severe mitral regurgitation    Skin cancer    face    PSH: Past Surgical History:  Procedure Laterality Date   APPENDECTOMY  as teen   BUBBLE STUDY  12/11/2021   Procedure: BUBBLE STUDY;  Surgeon: Loni Soyla LABOR, MD;  Location[facility] ENDOSCOPY;  Service: Cardiology;;   CARDIAC CATHETERIZATION  03-29-2000   dr hochrein   Nonobstuctive CAD /   CFX patent stent and LAD diffuse luminal irregularities including mid segment previously been angioplastied   CARDIOVASCULAR STRESS TEST  05-09-2015   dr hochrein   low risk perfusion study/  no reversible ischemia/  normal LV function and wall motion , ef 63%   CARDIOVERSION N/A 04/16/2022   Procedure: CARDIOVERSION;  Surgeon: Barbaraann Darryle Ned, MD;  Location: [facility] ENDOSCOPY;  Service: Cardiovascular;  Laterality: N/A;   CATARACT EXTRACTION W/ INTRAOCULAR LENS  IMPLANT, BILATERAL  left 05-06-2015/  right 06-10-2015   CORONARY ANGIOPLASTY WITH STENT PLACEMENT  01-21-2000  dr hochrein   PTCA to pLAD/  PTCA and BMS x1 to mid LCFX/  normal LVSF   CORONARY ANGIOPLASTY WITH STENT PLACEMENT  01-10-2002  dr charlanne   PTCA and BMS to mid AV LCFX (in-stent restenosis)/  diffuse 30-40% mRCA ,  mLAD diffuse 30%,  normal LVSF, ef 65%   CYSTOSCOPY/RETROGRADE/URETEROSCOPY/STONE EXTRACTION WITH BASKET  Left 05/23/2015   Procedure: PHYLLIS SNELL OF PROSTATE,  LEFT URETEROSCOPY, JJ STENT PLACEMENT;  Surgeon: Donnice Brooks, MD;  Location: [facility];  Service: Urology;  Laterality: Left;   ELECTROPHYSIOLOGIC STUDY  03-30-2000   dr fernande   EXTRACORPOREAL SHOCK WAVE LITHOTRIPSY Left 10-30-2012   EXTRACORPOREAL SHOCK WAVE LITHOTRIPSY Right 11/16/2021   Procedure: EXTRACORPOREAL SHOCK WAVE LITHOTRIPSY (ESWL);  Surgeon: Matilda Senior, MD;  Location: [facility];  Service: Urology;  Laterality: Right;    HOLMIUM LASER APPLICATION Left 05/23/2015   Procedure: WITH HOLMIUM LASER LITHOTRIPSY;  Surgeon: Donnice Brooks, MD;  Location[facility];  Service: Urology;  Laterality: Left;   KNEE ARTHROSCOPY Bilateral x2 right/  left 2015   LUMBAR LAMINECTOMY/DECOMPRESSION MICRODISCECTOMY N/A 09/28/2012   Procedure: L3  -L5 DECOMPRESSION L4 - L5 FUSION WITH INSTRUMENTATION 2 LEVELS;  Surgeon: Donaciano Sprang, MD;  Location: [facility] OR;  Service: Orthopedics;  Laterality: N/A;   RIGHT/LEFT HEART CATH AND CORONARY ANGIOGRAPHY N/A 01/27/2022   Procedure: RIGHT/LEFT HEART CATH AND CORONARY ANGIOGRAPHY;  Surgeon: Wendel Lurena POUR, MD;  Location: [facility] INVASIVE CV LAB;  Service: Cardiovascular;  Laterality: N/A;   TEE WITHOUT CARDIOVERSION N/A 12/11/2021   Procedure: TRANSESOPHAGEAL ECHOCARDIOGRAM (TEE);  Surgeon: Loni Soyla LABOR, MD;  Location: [facility] ENDOSCOPY;  Service: Cardiology;  Laterality: N/A;   TEE WITHOUT CARDIOVERSION N/A 03/25/2022   Procedure: TRANSESOPHAGEAL ECHOCARDIOGRAM (TEE);  Surgeon: Thukkani, Arun K, MD;  Location: [facility] INVASIVE CV LAB;  Service: Cardiovascular;  Laterality: N/A;   TONSILLECTOMY  as child   TOTAL KNEE ARTHROPLASTY Left 10/02/2018   Procedure: TOTAL LEFT  KNEE ARTHROPLASTY;  Surgeon: Ernie Donnice, MD;  Location: [facility] ORS;  Service: Orthopedics;  Laterality: Left;  90 mins- Ok per Tiffany   TOTAL SHOULDER ARTHROPLASTY Right 01/14/2011   TRANSCAROTID ARTERY REVASCULARIZATION  Right 05/09/2023   Procedure: Right Transcarotid Artery Revascularization;  Surgeon: Lanis Fonda BRAVO, MD;  Location: [facility] OR;  Service: Vascular;  Laterality: Right;   TRANSCATHETER MITRAL EDGE TO EDGE REPAIR N/A 03/25/2022   Procedure: MITRAL VALVE REPAIR;  Surgeon: Wendel Lurena POUR, MD;  Location: [facility] INVASIVE CV LAB;  Service: Cardiovascular;  Laterality: N/A;   TRANSTHORACIC ECHOCARDIOGRAM  05-21-2015   mild LVH,  ef 55-605,  grade 2 diastolic dysfunction, basal inferior hyperkinesis/  moderate AV  calcification sclerosis without stenosis/  trivial MR/  moderate LAE/  mild TR/  mild RAE   ULTRASOUND GUIDANCE FOR VASCULAR ACCESS Left 05/09/2023   Procedure: ULTRASOUND GUIDANCE FOR VASCULAR ACCESS, LEFT FEMORAL VEIN;  Surgeon: Lanis Fonda BRAVO, MD;  Location: [facility] OR;  Service: Vascular;  Laterality: Left;    Social History:  reports that he quit smoking about 61 years ago. His smoking use included cigarettes. He started smoking about 64 years ago. He has a 3 pack-year smoking history. He has never used smokeless tobacco. He reports that he does not drink alcohol and does not use drugs. BMI: Estimated body mass index is 26.49 kg/m as calculated from the following:   Height as of 07/11/24: 5' 8 (1.727 m).   Weight as of 07/11/24: 79 kg.  Lab Results  Component Value Date   ALBUMIN  3.1 (L) 12/22/2023   Diabetes: Patient does not have a diagnosis of diabetes.     Smoking Status:      Allergies:  Allergies[1]  Medications: No current facility-administered medications for this encounter.   Current Outpatient Medications  Medication Sig Dispense Refill   acetaminophen  (TYLENOL ) 650 MG CR tablet Take  650 mg by mouth daily as needed for pain.     amiodarone  (PACERONE ) 200 MG tablet Take 1 tablet (200 mg total) by mouth daily. 90 tablet 3   amLODipine  (NORVASC ) 2.5 MG tablet Take 1 tablet (2.5 mg total) by mouth daily. 90 tablet 3   apixaban  (ELIQUIS ) 5 MG TABS tablet Take 1/2 tablet by mouth twice daily 90 tablet 1   aspirin  EC 81 MG tablet Take 1 tablet (81 mg total) by mouth daily. Swallow whole.     atorvastatin  (LIPITOR ) 80 MG tablet Take 1 tablet (80 mg total) by mouth at bedtime. 90 tablet 3   cholecalciferol (VITAMIN D3) 25 MCG (1000 UNIT) tablet Take 1,000 Units by mouth daily.     Cyanocobalamin (B-12) 5000 MCG CAPS Take 5,000 mcg by mouth daily.     dapagliflozin  propanediol (FARXIGA ) 10 MG TABS tablet Take 1 tablet (10 mg total) by mouth daily before breakfast. 90 tablet 3    ezetimibe  (ZETIA ) 10 MG tablet Take 1 tablet (10 mg total) by mouth daily. 90 tablet 3   ferrous sulfate  325 (65 FE) MG tablet Take 325 mg by mouth daily with breakfast. (Patient taking differently: Take 325 mg by mouth every Monday, Wednesday, and Friday.)     finasteride  (PROSCAR ) 5 MG tablet Take 5 mg by mouth daily.     folic acid  (FOLVITE ) 800 MCG tablet Take 800 mcg by mouth daily.      furosemide  (LASIX ) 20 MG tablet Take 20 mg by mouth every Monday, Wednesday, and Friday.     hydrOXYzine (ATARAX) 10 MG tablet Take 10 mg by mouth at bedtime as needed for anxiety.     ketoconazole (NIZORAL) 2 % shampoo Apply 1 Application topically See admin instructions. 1-3 times weekly     levothyroxine  (SYNTHROID ) 50 MCG tablet Take 50 mcg by mouth every morning.     mupirocin ointment (BACTROBAN) 2 % Apply 1 Application topically daily as needed.     niacin  500 MG tablet Take 500 mg by mouth at bedtime.     nitroGLYCERIN  (NITROSTAT ) 0.4 MG SL tablet Place 1 tablet (0.4 mg total) under the tongue every 5 (five) minutes as needed for chest pain. 25 tablet 3   pantoprazole  (PROTONIX ) 40 MG tablet Take 1 tablet by mouth once daily. 30 tablet 0   polyethylene glycol (MIRALAX  / GLYCOLAX ) 17 g packet Take 17 g by mouth daily as needed for mild constipation.     traMADol  (ULTRAM ) 50 MG tablet Take 50 mg by mouth every 6 (six) hours as needed for moderate pain (pain score 4-6) or severe pain (pain score 7-10).     HYDROcodone -acetaminophen  (NORCO) 10-325 MG tablet Take 1 tablet by mouth as needed. (Patient not taking: Reported on 09/04/2024)      No results found for this or any previous visit (from the past 48 hours). No results found.  ROS: Pain with rom of the right lower extremity  Physical Exam: Alert and oriented 89 y.o. male in no acute distress Cranial nerves 2-12 intact Cervical spine: full rom with no tenderness, nv intact distally Chest: active breath sounds bilaterally, no wheeze rhonchi or  rales Heart: regular rate and rhythm, no murmur Abd: non tender non distended with active bowel sounds Hip is stable with rom  Right knee painful and weak rom Nv intact distally Antalgic gait  Assessment/Plan Assessment: right knee end stage osteoarthritis  Plan:  Patient will undergo a right total knee by Dr. Kay at [place] Risks  benefits and expectations were discussed with the patient. Patient understand risks, benefits and expectations and wishes to proceed. Preoperative templating of the joint replacement has been completed, documented, and submitted to the Operating Room personnel in order to optimize intra-operative equipment management.   Arvella Fireman PA-C, MPAS [facility] is now [employer] [address], [address], [place], [place] [address] Phone: [phone] [email or web address]  [employer]/film/video        [1] No Known Allergies  "

## 2024-09-12 ENCOUNTER — Encounter (HOSPITAL_COMMUNITY): Admission: RE | Admit: 2024-09-12 | Payer: Self-pay

## 2024-09-21 ENCOUNTER — Ambulatory Visit (HOSPITAL_COMMUNITY): Admit: 2024-09-21 | Payer: Self-pay | Admitting: Orthopedic Surgery

## 2024-09-21 SURGERY — ARTHROPLASTY, KNEE, TOTAL
Anesthesia: Choice | Site: Knee | Laterality: Right

## 2024-11-07 ENCOUNTER — Ambulatory Visit (HOSPITAL_COMMUNITY): Payer: Self-pay | Admitting: Cardiology

## 2024-11-20 ENCOUNTER — Institutional Professional Consult (permissible substitution): Admitting: Neurology
